# Patient Record
Sex: Female | Born: 1990 | Race: Black or African American | Hispanic: No | Marital: Single | Smoking: Former smoker
Health system: Southern US, Community
[De-identification: ages and names within clinical notes are randomized; demographics above are authoritative.]

## PROBLEM LIST (undated history)

## (undated) DIAGNOSIS — F1010 Alcohol abuse, uncomplicated: Secondary | ICD-10-CM

## (undated) DIAGNOSIS — Z9151 Personal history of suicidal behavior: Secondary | ICD-10-CM

## (undated) DIAGNOSIS — IMO0002 Reserved for concepts with insufficient information to code with codable children: Secondary | ICD-10-CM

## (undated) DIAGNOSIS — E119 Type 2 diabetes mellitus without complications: Secondary | ICD-10-CM

## (undated) DIAGNOSIS — Z915 Personal history of self-harm: Secondary | ICD-10-CM

## (undated) DIAGNOSIS — D649 Anemia, unspecified: Secondary | ICD-10-CM

## (undated) DIAGNOSIS — C801 Malignant (primary) neoplasm, unspecified: Secondary | ICD-10-CM

## (undated) DIAGNOSIS — F319 Bipolar disorder, unspecified: Secondary | ICD-10-CM

## (undated) DIAGNOSIS — M1990 Unspecified osteoarthritis, unspecified site: Secondary | ICD-10-CM

## (undated) DIAGNOSIS — M069 Rheumatoid arthritis, unspecified: Secondary | ICD-10-CM

## (undated) DIAGNOSIS — F1210 Cannabis abuse, uncomplicated: Secondary | ICD-10-CM

## (undated) HISTORY — DX: Type 2 diabetes mellitus without complications: E11.9

---

## 2012-11-16 ENCOUNTER — Emergency Department (HOSPITAL_COMMUNITY)
Admission: EM | Admit: 2012-11-16 | Discharge: 2012-11-17 | Disposition: A | Discharge status: Home / Self Care | Payer: Self-pay | Attending: Emergency Medicine | Admitting: Emergency Medicine

## 2012-11-16 ENCOUNTER — Encounter (HOSPITAL_COMMUNITY): Payer: Self-pay | Admitting: *Deleted

## 2012-11-16 DIAGNOSIS — Y9389 Activity, other specified: Secondary | ICD-10-CM | POA: Insufficient documentation

## 2012-11-16 DIAGNOSIS — IMO0002 Reserved for concepts with insufficient information to code with codable children: Secondary | ICD-10-CM | POA: Insufficient documentation

## 2012-11-16 DIAGNOSIS — Y92410 Unspecified street and highway as the place of occurrence of the external cause: Secondary | ICD-10-CM | POA: Insufficient documentation

## 2012-11-16 DIAGNOSIS — M549 Dorsalgia, unspecified: Secondary | ICD-10-CM

## 2012-11-16 MED ORDER — OXYCODONE-ACETAMINOPHEN 5-325 MG PO TABS
1.0000 | ORAL_TABLET | Freq: Once | ORAL | Status: AC
  Administered 2012-11-16: 1 via ORAL
  Filled 2012-11-16: qty 1
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
## 2012-11-17 MED ORDER — DIAZEPAM 5 MG PO TABS
5.0000 mg | ORAL_TABLET | Freq: Once | ORAL | Status: AC
  Administered 2012-11-17: 5 mg via ORAL
  Filled 2012-11-17: qty 1

## 2012-11-17 MED ORDER — PERCOCET 5-325 MG PO TABS: 1.0000 | ORAL_TABLET | Freq: Four times a day (QID) | ORAL | Status: DC | PRN
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
## 2012-11-17 NOTE — ED Provider Notes (Signed)
Medical screening examination/treatment/procedure(s) were performed by non-physician practitioner and as supervising physician I was immediately available for consultation/collaboration.  Olivia Mackie, MD 11/17/12 [phone]

## 2013-08-31 ENCOUNTER — Encounter (HOSPITAL_COMMUNITY): Payer: Self-pay | Admitting: Emergency Medicine

## 2013-08-31 ENCOUNTER — Emergency Department (HOSPITAL_COMMUNITY)
Admission: EM | Admit: 2013-08-31 | Discharge: 2013-09-01 | Disposition: A | Discharge status: Home / Self Care | Payer: Self-pay | Attending: Emergency Medicine | Admitting: Emergency Medicine

## 2013-08-31 ENCOUNTER — Emergency Department (HOSPITAL_COMMUNITY): Payer: Self-pay

## 2013-08-31 DIAGNOSIS — N949 Unspecified condition associated with female genital organs and menstrual cycle: Secondary | ICD-10-CM | POA: Insufficient documentation

## 2013-08-31 DIAGNOSIS — N9489 Other specified conditions associated with female genital organs and menstrual cycle: Secondary | ICD-10-CM

## 2013-08-31 DIAGNOSIS — F17200 Nicotine dependence, unspecified, uncomplicated: Secondary | ICD-10-CM | POA: Insufficient documentation

## 2013-08-31 DIAGNOSIS — Z3202 Encounter for pregnancy test, result negative: Secondary | ICD-10-CM | POA: Insufficient documentation

## 2013-08-31 DIAGNOSIS — M549 Dorsalgia, unspecified: Secondary | ICD-10-CM | POA: Insufficient documentation

## 2013-08-31 LAB — BASIC METABOLIC PANEL
BUN: 11 mg/dL (ref 6–23)
CALCIUM: 9.3 mg/dL (ref 8.4–10.5)
CO2: 26 mEq/L (ref 19–32)
CREATININE: 0.73 mg/dL (ref 0.50–1.10)
Chloride: 100 mEq/L (ref 96–112)
Glucose, Bld: 84 mg/dL (ref 70–99)
Potassium: 3.8 mEq/L (ref 3.7–5.3)
Sodium: 138 mEq/L (ref 137–147)

## 2013-08-31 LAB — CBC WITH DIFFERENTIAL/PLATELET
BASOS ABS: 0 10*3/uL (ref 0.0–0.1)
BASOS PCT: 0 % (ref 0–1)
EOS ABS: 0.1 10*3/uL (ref 0.0–0.7)
EOS PCT: 1 % (ref 0–5)
HEMATOCRIT: 38.6 % (ref 36.0–46.0)
Hemoglobin: 12.9 g/dL (ref 12.0–15.0)
Lymphocytes Relative: 37 % (ref 12–46)
Lymphs Abs: 3.6 10*3/uL (ref 0.7–4.0)
MCH: 30.9 pg (ref 26.0–34.0)
MCHC: 33.4 g/dL (ref 30.0–36.0)
MCV: 92.6 fL (ref 78.0–100.0)
MONO ABS: 0.9 10*3/uL (ref 0.1–1.0)
Monocytes Relative: 9 % (ref 3–12)
NEUTROS ABS: 5.1 10*3/uL (ref 1.7–7.7)
Neutrophils Relative %: 52 % (ref 43–77)
Platelets: 312 10*3/uL (ref 150–400)
RBC: 4.17 MIL/uL (ref 3.87–5.11)
RDW: 12.9 % (ref 11.5–15.5)
WBC: 9.8 10*3/uL (ref 4.0–10.5)

## 2013-08-31 LAB — URINALYSIS, ROUTINE W REFLEX MICROSCOPIC
Bilirubin Urine: NEGATIVE
GLUCOSE, UA: NEGATIVE mg/dL
Hgb urine dipstick: NEGATIVE
KETONES UR: NEGATIVE mg/dL
LEUKOCYTES UA: NEGATIVE
Nitrite: NEGATIVE
PH: 6 (ref 5.0–8.0)
Protein, ur: NEGATIVE mg/dL
Specific Gravity, Urine: 1.024 (ref 1.005–1.030)
Urobilinogen, UA: 0.2 mg/dL (ref 0.0–1.0)

## 2013-08-31 LAB — WET PREP, GENITAL
Trich, Wet Prep: NONE SEEN
YEAST WET PREP: NONE SEEN

## 2013-08-31 LAB — PREGNANCY, URINE: Preg Test, Ur: NEGATIVE

## 2013-08-31 MED ORDER — TRAMADOL HCL 50 MG PO TABS: 50.0000 mg | ORAL_TABLET | Freq: Four times a day (QID) | ORAL | Status: DC | PRN

## 2013-08-31 MED ORDER — LORAZEPAM 2 MG/ML IJ SOLN
0.5000 mg | Freq: Once | INTRAMUSCULAR | Status: AC
  Administered 2013-08-31: 0.5 mg via INTRAVENOUS
  Filled 2013-08-31: qty 1

## 2013-08-31 MED ORDER — KETOROLAC TROMETHAMINE 30 MG/ML IJ SOLN
30.0000 mg | Freq: Once | INTRAMUSCULAR | Status: AC
  Administered 2013-08-31: 30 mg via INTRAVENOUS
  Filled 2013-08-31: qty 1

## 2013-08-31 MED ORDER — SODIUM CHLORIDE 0.9 % IV SOLN
INTRAVENOUS | Status: DC
  Administered 2013-08-31: 22:00:00 via INTRAVENOUS

## 2013-08-31 MED ORDER — ONDANSETRON HCL 4 MG/2ML IJ SOLN
4.0000 mg | Freq: Once | INTRAMUSCULAR | Status: AC
  Administered 2013-08-31: 4 mg via INTRAVENOUS
  Filled 2013-08-31: qty 2

## 2013-08-31 MED ORDER — MORPHINE SULFATE 4 MG/ML IJ SOLN
4.0000 mg | Freq: Once | INTRAMUSCULAR | Status: AC
  Administered 2013-08-31: 4 mg via INTRAVENOUS
  Filled 2013-08-31: qty 1

## 2013-08-31 MED ORDER — HYDROMORPHONE HCL PF 1 MG/ML IJ SOLN
1.0000 mg | Freq: Once | INTRAMUSCULAR | Status: AC
  Administered 2013-08-31: 1 mg via INTRAVENOUS
  Filled 2013-08-31: qty 1

## 2013-08-31 MED ORDER — DIAZEPAM 5 MG PO TABS: 5.0000 mg | ORAL_TABLET | Freq: Two times a day (BID) | ORAL | Status: DC

## 2013-08-31 NOTE — Discharge Instructions (Signed)
Pelvic Pain, Female °Female pelvic pain can be caused by many different things and start from a variety of places. Pelvic pain refers to pain that is located in the lower half of the abdomen and between your hips. The pain may occur over a short period of time (acute) or may be reoccurring (chronic). The cause of pelvic pain may be related to disorders affecting the female reproductive organs (gynecologic), but it may also be related to the bladder, kidney stones, an intestinal complication, or muscle or skeletal problems. Getting help right away for pelvic pain is important, especially if there has been severe, sharp, or a sudden onset of unusual pain. It is also important to get help right away because some types of pelvic pain can be life threatening.  °CAUSES  °Below are only some of the causes of pelvic pain. The causes of pelvic pain can be in one of several categories.  °· Gynecologic. °· Pelvic inflammatory disease. °· Sexually transmitted infection. °· Ovarian cyst or a twisted ovarian ligament (ovarian torsion). °· Uterine lining that grows outside the uterus (endometriosis). °· Fibroids, cysts, or tumors. °· Ovulation. °· Pregnancy. °· Pregnancy that occurs outside the uterus (ectopic pregnancy). °· Miscarriage. °· Labor. °· Abruption of the placenta or ruptured uterus. °· Infection. °· Uterine infection (endometritis). °· Bladder infection. °· Diverticulitis. °· Miscarriage related to a uterine infection (septic abortion). °· Bladder. °· Inflammation of the bladder (cystitis). °· Kidney stone(s). °· Gastrointenstinal. °· Constipation. °· Diverticulitis. °· Neurologic. °· Trauma. °· Feeling pelvic pain because of mental or emotional causes (psychosomatic). °· Cancers of the bowel or pelvis. °EVALUATION  °Your caregiver will want to take a careful history of your concerns. This includes recent changes in your health, a careful gynecologic history of your periods (menses), and a sexual history. Obtaining  your family history and medical history is also important. Your caregiver may suggest a pelvic exam. A pelvic exam will help identify the location and severity of the pain. It also helps in the evaluation of which organ system may be involved. In order to identify the cause of the pelvic pain and be properly treated, your caregiver may order tests. These tests may include:  °· A pregnancy test. °· Pelvic ultrasonography. °· An X-ray exam of the abdomen. °· A urinalysis or evaluation of vaginal discharge. °· Blood tests. °HOME CARE INSTRUCTIONS  °· Only take over-the-counter or prescription medicines for pain, discomfort, or fever as directed by your caregiver.   °· Rest as directed by your caregiver.   °· Eat a balanced diet.   °· Drink enough fluids to make your urine clear or pale yellow, or as directed.   °· Avoid sexual intercourse if it causes pain.   °· Apply warm or cold compresses to the lower abdomen depending on which one helps the pain.   °· Avoid stressful situations.   °· Keep a journal of your pelvic pain. Write down when it started, where the pain is located, and if there are things that seem to be associated with the pain, such as food or your menstrual cycle. °· Follow up with your caregiver as directed.   °SEEK MEDICAL CARE IF: °· Your medicine does not help your pain. °· You have abnormal vaginal discharge. °SEEK IMMEDIATE MEDICAL CARE IF:  °· You have heavy bleeding from the vagina.   °· Your pelvic pain increases.   °· You feel lightheaded or faint.   °· You have chills.   °· You have pain with urination or blood in your urine.   °· You have uncontrolled   diarrhea or vomiting.   °· You have a fever or persistent symptoms for more than 3 days. °· You have a fever and your symptoms suddenly get worse.   °· You are being physically or sexually abused.   °MAKE SURE YOU: °· Understand these instructions. °· Will watch your condition. °· Will get help if you are not doing well or get worse. °Document  Released: 06/16/2004 Document Revised: 01/19/2012 Document Reviewed: 11/09/2011 °[employer]® Patient Information ©2014 [employer]. ° °

## 2013-08-31 NOTE — ED Notes (Signed)
Pt states she is having pelvic pain  Pt states it is like cramping  Pt states the cramping is intermittent and is regular like every 7 minutes apart  Pt states she is not pregnant   Pt states her last period lasted 3 days  Started on the Saturday the 24th

## 2013-08-31 NOTE — ED Provider Notes (Signed)
CSN: [id number]     Arrival date & time 08/31/13  2023 History   First MD Initiated Contact with Patient 08/31/13 2029     Chief Complaint  Patient presents with  . Pelvic Pain   (Consider location/radiation/quality/duration/timing/severity/associated sxs/prior Treatment) HPI  Patient presents to the ER with complaints of pelvic pain. She started her period 4 days ago and it ended yesterday. Her period was a lighter flow than normal but the color was regular. She developed pelvic pain today that is severe and intermittent. The pain brings her to tears. She describes suprapubic cramping that lasts a few minutes and stops, as if they were labor pains. She admits to being sexually active, she does not take birth control or use protection. She is only with one sexual partner. She denies taking any hormone therapy. Denies ever having pains like this in the past. No fevers, nausea, vomiting, diarrhea, weakness, confusion, irregular vaginal discharge. + back pain  History reviewed. No pertinent past medical history. History reviewed. No pertinent past surgical history. Family History  Problem Relation Age of Onset  . Hypertension Other    History  Substance Use Topics  . Smoking status: Current Every Day Smoker    Types: Cigarettes  . Smokeless tobacco: Not on file  . Alcohol Use: Yes     Comment: occ   OB History   Grav Para Term Preterm Abortions TAB SAB Ect Mult Living                 Review of Systems  All other systems reviewed and are negative.     Allergies  Review of patient's allergies indicates no known allergies.  Home Medications   Current Outpatient Rx  Name  Route  Sig  Dispense  Refill  . diazepam (VALIUM) 5 MG tablet   Oral   Take 1 tablet (5 mg total) by mouth 2 (two) times daily.   10 tablet   0   . traMADol (ULTRAM) 50 MG tablet   Oral   Take 1 tablet (50 mg total) by mouth every 6 (six) hours as needed.   15 tablet   0    BP 117/74  Pulse 87   Temp(Src) 99 F (37.2 C) (Oral)  Resp 16  Ht 6' (1.829 m)  Wt 250 lb (113.399 kg)  BMI 33.90 kg/m2  SpO2 100%  LMP 08/26/2013 Physical Exam  Nursing note and vitals reviewed. Constitutional: She appears well-developed and well-nourished. No distress.  HENT:  Head: Normocephalic and atraumatic.  Eyes: Pupils are equal, round, and reactive to light.  Neck: Normal range of motion. Neck supple.  Cardiovascular: Normal rate and regular rhythm.   Pulmonary/Chest: Effort normal.  Abdominal: Soft. There is tenderness in the suprapubic area. There is guarding. There is no rigidity, no rebound, no CVA tenderness and negative Murphy's sign.  Neurological: She is alert.  Skin: Skin is warm and dry.    ED Course  Procedures (including critical care time) Labs Review Labs Reviewed  WET PREP, GENITAL - Abnormal; Notable for the following:    Clue Cells Wet Prep HPF POC FEW (*)    WBC, Wet Prep HPF POC FEW (*)    All other components within normal limits  URINALYSIS, ROUTINE W REFLEX MICROSCOPIC - Abnormal; Notable for the following:    APPearance CLOUDY (*)    All other components within normal limits  GC/CHLAMYDIA PROBE AMP  PREGNANCY, URINE  CBC WITH DIFFERENTIAL  BASIC METABOLIC PANEL   Imaging  Review US Transvaginal Non-ob  08/31/2013   CLINICAL DATA:  Pelvic pain  EXAM: TRANSABDOMINAL AND TRANSVAGINAL ULTRASOUND OF PELVIS  DOPPLER ULTRASOUND OF OVARIES  TECHNIQUE: Both transabdominal and transvaginal ultrasound examinations of the pelvis were performed. Transabdominal technique was performed for global imaging of the pelvis including uterus, ovaries, adnexal regions, and pelvic cul-de-sac.  It was necessary to proceed with endovaginal exam following the transabdominal exam to visualize the ovaries and better detail. Color and duplex Doppler ultrasound was utilized to evaluate blood flow to the ovaries.  COMPARISON:  None.  FINDINGS: Uterus  Measurements: 8.2 x 3.9 x 4.6 cm. Small  scattered uterine fibroids measuring 1.5 cm or less in size.  Endometrium  Thickness: 7 mm.  No focal abnormality visualized.  Right ovary  Measurements: 3.1 x 1.7 x 1.9 cm. Normal appearance/no adnexal mass.  Left ovary  Measurements: 4.1 x 2.6 x 3.0 cm. Normal appearance/no adnexal mass.  Pulsed Doppler evaluation of both ovaries demonstrates normal low-resistance arterial and venous waveforms.  Other findings  No free fluid.  IMPRESSION: No acute finding by pelvic ultrasound. Incidental small uterine fibroids.   Electronically Signed   By: Daryll Brod M.D.   On: 08/31/2013 22:54   US Pelvis Complete  08/31/2013   CLINICAL DATA:  Pelvic pain  EXAM: TRANSABDOMINAL AND TRANSVAGINAL ULTRASOUND OF PELVIS  DOPPLER ULTRASOUND OF OVARIES  TECHNIQUE: Both transabdominal and transvaginal ultrasound examinations of the pelvis were performed. Transabdominal technique was performed for global imaging of the pelvis including uterus, ovaries, adnexal regions, and pelvic cul-de-sac.  It was necessary to proceed with endovaginal exam following the transabdominal exam to visualize the ovaries and better detail. Color and duplex Doppler ultrasound was utilized to evaluate blood flow to the ovaries.  COMPARISON:  None.  FINDINGS: Uterus  Measurements: 8.2 x 3.9 x 4.6 cm. Small scattered uterine fibroids measuring 1.5 cm or less in size.  Endometrium  Thickness: 7 mm.  No focal abnormality visualized.  Right ovary  Measurements: 3.1 x 1.7 x 1.9 cm. Normal appearance/no adnexal mass.  Left ovary  Measurements: 4.1 x 2.6 x 3.0 cm. Normal appearance/no adnexal mass.  Pulsed Doppler evaluation of both ovaries demonstrates normal low-resistance arterial and venous waveforms.  Other findings  No free fluid.  IMPRESSION: No acute finding by pelvic ultrasound. Incidental small uterine fibroids.   Electronically Signed   By: Daryll Brod M.D.   On: 08/31/2013 22:54   [place] Art/ven Flow Abd Pelv Doppler  08/31/2013   CLINICAL DATA:   Pelvic pain  EXAM: TRANSABDOMINAL AND TRANSVAGINAL ULTRASOUND OF PELVIS  DOPPLER ULTRASOUND OF OVARIES  TECHNIQUE: Both transabdominal and transvaginal ultrasound examinations of the pelvis were performed. Transabdominal technique was performed for global imaging of the pelvis including uterus, ovaries, adnexal regions, and pelvic cul-de-sac.  It was necessary to proceed with endovaginal exam following the transabdominal exam to visualize the ovaries and better detail. Color and duplex Doppler ultrasound was utilized to evaluate blood flow to the ovaries.  COMPARISON:  None.  FINDINGS: Uterus  Measurements: 8.2 x 3.9 x 4.6 cm. Small scattered uterine fibroids measuring 1.5 cm or less in size.  Endometrium  Thickness: 7 mm.  No focal abnormality visualized.  Right ovary  Measurements: 3.1 x 1.7 x 1.9 cm. Normal appearance/no adnexal mass.  Left ovary  Measurements: 4.1 x 2.6 x 3.0 cm. Normal appearance/no adnexal mass.  Pulsed Doppler evaluation of both ovaries demonstrates normal low-resistance arterial and venous waveforms.  Other findings  No free fluid.  IMPRESSION: No acute finding by pelvic ultrasound. Incidental small uterine fibroids.   Electronically Signed   By: Daryll Brod M.D.   On: 08/31/2013 22:54    EKG Interpretation   None       MDM   1. Uterine cramping    Patients work-up has returned negative. Unsure of cause of her uterine cramping. However, she is not pregnant, does not have infection, no ovarian torsion, no abnormal lab work.  Her pain and spasms have been treated in the ED and have largely resolved. She has been given a work note, pain medication and muscle relaxers for home.  22 y.o.Tammy Garrison's evaluation in the Emergency Department is complete. It has been determined that no acute conditions requiring further emergency intervention are present at this time. The patient/guardian have been advised of the diagnosis and plan. We have discussed signs and symptoms  that warrant return to the ED, such as changes or worsening in symptoms.  Vital signs are stable at discharge. Filed Vitals:   08/31/13 2242  BP: 117/74  Pulse: 87  Temp: 99 F (37.2 C)  Resp: 16    Patient/guardian has voiced understanding and agreed to follow-up with the PCP or specialist.     Linus Mako, PA-C 08/31/13 2357

## 2013-09-01 LAB — GC/CHLAMYDIA PROBE AMP
CT Probe RNA: NEGATIVE
GC PROBE AMP APTIMA: NEGATIVE

## 2013-09-03 NOTE — ED Provider Notes (Signed)
Medical screening examination/treatment/procedure(s) were performed by non-physician practitioner and as supervising physician I was immediately available for consultation/collaboration.  EKG Interpretation   None      '  Onofre Gains E Ariday Brinker, MD 09/03/13 [id number]

## 2013-09-12 ENCOUNTER — Emergency Department (HOSPITAL_COMMUNITY): Payer: Self-pay

## 2013-09-12 ENCOUNTER — Emergency Department (HOSPITAL_COMMUNITY)
Admission: EM | Admit: 2013-09-12 | Discharge: 2013-09-13 | Disposition: A | Discharge status: Home / Self Care | Payer: Self-pay | Attending: Emergency Medicine | Admitting: Emergency Medicine

## 2013-09-12 ENCOUNTER — Encounter (HOSPITAL_COMMUNITY): Payer: Self-pay | Admitting: Emergency Medicine

## 2013-09-12 DIAGNOSIS — Z79899 Other long term (current) drug therapy: Secondary | ICD-10-CM | POA: Insufficient documentation

## 2013-09-12 DIAGNOSIS — F17200 Nicotine dependence, unspecified, uncomplicated: Secondary | ICD-10-CM | POA: Insufficient documentation

## 2013-09-12 DIAGNOSIS — Z3202 Encounter for pregnancy test, result negative: Secondary | ICD-10-CM | POA: Insufficient documentation

## 2013-09-12 DIAGNOSIS — N390 Urinary tract infection, site not specified: Secondary | ICD-10-CM | POA: Insufficient documentation

## 2013-09-12 DIAGNOSIS — R102 Pelvic and perineal pain: Secondary | ICD-10-CM

## 2013-09-12 DIAGNOSIS — Z792 Long term (current) use of antibiotics: Secondary | ICD-10-CM | POA: Insufficient documentation

## 2013-09-12 DIAGNOSIS — R42 Dizziness and giddiness: Secondary | ICD-10-CM | POA: Insufficient documentation

## 2013-09-12 LAB — CBC WITH DIFFERENTIAL/PLATELET
Basophils Absolute: 0 10*3/uL (ref 0.0–0.1)
Basophils Relative: 0 % (ref 0–1)
EOS ABS: 0.1 10*3/uL (ref 0.0–0.7)
EOS PCT: 1 % (ref 0–5)
HCT: 38.6 % (ref 36.0–46.0)
HEMOGLOBIN: 13 g/dL (ref 12.0–15.0)
LYMPHS ABS: 2.9 10*3/uL (ref 0.7–4.0)
LYMPHS PCT: 24 % (ref 12–46)
MCH: 31.6 pg (ref 26.0–34.0)
MCHC: 33.7 g/dL (ref 30.0–36.0)
MCV: 93.7 fL (ref 78.0–100.0)
MONOS PCT: 8 % (ref 3–12)
Monocytes Absolute: 1 10*3/uL (ref 0.1–1.0)
NEUTROS PCT: 67 % (ref 43–77)
Neutro Abs: 8.1 10*3/uL — ABNORMAL HIGH (ref 1.7–7.7)
Platelets: 257 10*3/uL (ref 150–400)
RBC: 4.12 MIL/uL (ref 3.87–5.11)
RDW: 13 % (ref 11.5–15.5)
WBC: 12.2 10*3/uL — AB (ref 4.0–10.5)

## 2013-09-12 LAB — WET PREP, GENITAL
Clue Cells Wet Prep HPF POC: NONE SEEN
TRICH WET PREP: NONE SEEN
YEAST WET PREP: NONE SEEN

## 2013-09-12 LAB — COMPREHENSIVE METABOLIC PANEL
ALK PHOS: 66 U/L (ref 39–117)
ALT: 25 U/L (ref 0–35)
AST: 16 U/L (ref 0–37)
Albumin: 3.9 g/dL (ref 3.5–5.2)
BILIRUBIN TOTAL: 0.2 mg/dL — AB (ref 0.3–1.2)
BUN: 12 mg/dL (ref 6–23)
CO2: 26 meq/L (ref 19–32)
Calcium: 9.5 mg/dL (ref 8.4–10.5)
Chloride: 101 mEq/L (ref 96–112)
Creatinine, Ser: 0.74 mg/dL (ref 0.50–1.10)
GLUCOSE: 97 mg/dL (ref 70–99)
POTASSIUM: 4.2 meq/L (ref 3.7–5.3)
Sodium: 138 mEq/L (ref 137–147)
TOTAL PROTEIN: 7.7 g/dL (ref 6.0–8.3)

## 2013-09-12 LAB — URINALYSIS, ROUTINE W REFLEX MICROSCOPIC
Bilirubin Urine: NEGATIVE
Glucose, UA: NEGATIVE mg/dL
KETONES UR: NEGATIVE mg/dL
NITRITE: POSITIVE — AB
PROTEIN: NEGATIVE mg/dL
Specific Gravity, Urine: 1.023 (ref 1.005–1.030)
Urobilinogen, UA: 1 mg/dL (ref 0.0–1.0)
pH: 6 (ref 5.0–8.0)

## 2013-09-12 LAB — URINE MICROSCOPIC-ADD ON

## 2013-09-12 LAB — LIPASE, BLOOD: LIPASE: 31 U/L (ref 11–59)

## 2013-09-12 LAB — POCT PREGNANCY, URINE: Preg Test, Ur: NEGATIVE

## 2013-09-12 MED ORDER — SODIUM CHLORIDE 0.9 % IV BOLUS (SEPSIS)
1000.0000 mL | Freq: Once | INTRAVENOUS | Status: AC
Start: 2013-09-12 — End: 2013-09-12
  Administered 2013-09-12: 1000 mL via INTRAVENOUS

## 2013-09-12 MED ORDER — NAPROXEN 375 MG PO TABS
375.0000 mg | ORAL_TABLET | Freq: Once | ORAL | Status: AC
  Administered 2013-09-12: 375 mg via ORAL
  Filled 2013-09-12: qty 1

## 2013-09-12 MED ORDER — CIPROFLOXACIN HCL 500 MG PO TABS: 500.0000 mg | ORAL_TABLET | Freq: Two times a day (BID) | ORAL | Status: DC

## 2013-09-12 MED ORDER — CEFTRIAXONE SODIUM 1 G IJ SOLR
1.0000 g | Freq: Once | INTRAMUSCULAR | Status: AC
  Administered 2013-09-12: 1 g via INTRAVENOUS
  Filled 2013-09-12: qty 10

## 2013-09-12 MED ORDER — KETOROLAC TROMETHAMINE 30 MG/ML IJ SOLN
30.0000 mg | Freq: Once | INTRAMUSCULAR | Status: AC
  Administered 2013-09-12: 30 mg via INTRAVENOUS
  Filled 2013-09-12: qty 1

## 2013-09-12 NOTE — ED Provider Notes (Signed)
CSN: [id number]     Arrival date & time 09/12/13  1745 History   First MD Initiated Contact with Patient 09/12/13 1926     Chief Complaint  Patient presents with  . Hematuria     (Consider location/radiation/quality/duration/timing/severity/associated sxs/prior Treatment) The history is provided by the patient. No language interpreter was used.  Tammy Garrison is a 23 y/o F with no significant PMHx presenting to the ED with right sided abdominal pain, left sided flank pain, hematuria, and dysuria that has started Friday - decreased yesterday - but has not increased today. Patient reported that she has been experiencing left flank pain described as a "pushing" sensation that worsens with urination. Stated that she's been experiencing right-sided abdominal pain described as a sharp pain that is constant but worse with urination. Stated that she's been having mild dysuria, tingling sensations with urination. Noted that with each urination there is hematuria. Stated that when she wiped she notices a dark red discharge on the toilet paper. Stated that this occurs intermittently with each urination. Reported that she's taking nothing for the pain. Stated that she started to feel mildly dizzy this afternoon. Reported that she is sexually active, does not use protection her birth control. Denied fever, chills, chest pain, shortness of breath, difficulty breathing, nausea, vomiting, diarrhea, hematochezia, melena, changes to appetite. PCP none  History reviewed. No pertinent past medical history. History reviewed. No pertinent past surgical history. Family History  Problem Relation Age of Onset  . Hypertension Other    History  Substance Use Topics  . Smoking status: Current Every Day Smoker    Types: Cigarettes  . Smokeless tobacco: Not on file  . Alcohol Use: Yes     Comment: occ   OB History   Grav Para Term Preterm Abortions TAB SAB Ect Mult Living                 Review of  Systems  Constitutional: Negative for fever and chills.  Respiratory: Negative for chest tightness and shortness of breath.   Cardiovascular: Negative for chest pain.  Gastrointestinal: Positive for abdominal pain (right sided). Negative for nausea, vomiting, diarrhea, constipation, blood in stool and anal bleeding.  Genitourinary: Positive for dysuria, hematuria and flank pain (left). Negative for decreased urine volume, vaginal bleeding, vaginal discharge, vaginal pain and pelvic pain.  Musculoskeletal: Negative for neck pain.  Neurological: Negative for dizziness and weakness.  All other systems reviewed and are negative.      Allergies  Review of patient's allergies indicates no known allergies.  Home Medications   Current Outpatient Rx  Name  Route  Sig  Dispense  Refill  . ciprofloxacin (CIPRO) 500 MG tablet   Oral   Take 1 tablet (500 mg total) by mouth 2 (two) times daily.   14 tablet   0   . diazepam (VALIUM) 5 MG tablet   Oral   Take 1 tablet (5 mg total) by mouth 2 (two) times daily.   10 tablet   0   . traMADol (ULTRAM) 50 MG tablet   Oral   Take 1 tablet (50 mg total) by mouth every 6 (six) hours as needed.   15 tablet   0    BP 121/61  Pulse 95  Temp(Src) 98.6 F (37 C) (Oral)  Resp 18  SpO2 99%  LMP 08/26/2013 Physical Exam  Nursing note and vitals reviewed. Constitutional: She is oriented to person, place, and time. She appears well-developed and well-nourished. No distress.  HENT:  Head: Normocephalic and atraumatic.  Mouth/Throat: Oropharynx is clear and moist. No oropharyngeal exudate.  Eyes: Conjunctivae and EOM are normal. Pupils are equal, round, and reactive to light. Right eye exhibits no discharge. Left eye exhibits no discharge.  Neck: Normal range of motion. Neck supple. No tracheal deviation present.  Negative neck stiffness Negative nuchal rigidity Negative cervical lymphadenopathy  Cardiovascular: Normal rate, regular rhythm and  normal heart sounds.   Pulses:      Radial pulses are 2+ on the right side, and 2+ on the left side.  Pulmonary/Chest: Effort normal and breath sounds normal. No respiratory distress. She has no wheezes. She has no rales.  Abdominal: Soft. Normal appearance and bowel sounds are normal. There is tenderness in the right upper quadrant, epigastric area and suprapubic area. There is CVA tenderness (left-sided) and positive Murphy's sign. There is no guarding.    Tenderness upon palpation to the RUQ and epigastric region  Tenderness upon palpation to the suprapubic region  Obese Positive Murphy's sign  Genitourinary:  Pelvic Exam: Negative swelling, erythema, inflammation, lesions, sores noted to the external genitalia. Negative signs of erythema, lesions, sores, masses noted to the vaginal canal Thick white discharge noted to the vaginal region. Negative blood in vaginal. Cervical os identified-negative friability. Unremarkable cervix. Negative bilateral adnexal tenderness. Discomfort upon palpation to suprapubic region. Exam chaperoned with tech  Musculoskeletal: Normal range of motion.  Full ROM to upper and lower extremities without difficulty noted, negative ataxia noted.  Lymphadenopathy:    She has no cervical adenopathy.  Neurological: She is alert and oriented to person, place, and time. No cranial nerve deficit. She exhibits normal muscle tone. Coordination normal.  Cranial nerves III-XII grossly intact Strength 5+/5+ to upper and lower extremities bilaterally with resistance applied, equal distribution noted  Skin: Skin is warm and dry. No rash noted. She is not diaphoretic. No erythema.  Psychiatric: She has a normal mood and affect. Her behavior is normal. Thought content normal.    ED Course  Procedures (including critical care time)  This provider reviewed patient's chart. Patient was seen and assessed in the emergency department in 08/31/2013 regarding uterine cramping. Patient  was discharged with tramadol and Valium with OB/GYN followup.  Results for orders placed during the hospital encounter of 09/12/13  WET PREP, GENITAL      Result Value Ref Range   Yeast Wet Prep HPF POC NONE SEEN  NONE SEEN   Trich, Wet Prep NONE SEEN  NONE SEEN   Clue Cells Wet Prep HPF POC NONE SEEN  NONE SEEN   WBC, Wet Prep HPF POC FEW (*) NONE SEEN  GC/CHLAMYDIA PROBE AMP      Result Value Ref Range   CT Probe RNA NEGATIVE  NEGATIVE   GC Probe RNA NEGATIVE  NEGATIVE  URINALYSIS, ROUTINE W REFLEX MICROSCOPIC      Result Value Ref Range   Color, Urine ORANGE (*) YELLOW   APPearance TURBID (*) CLEAR   Specific Gravity, Urine 1.023  1.005 - 1.030   pH 6.0  5.0 - 8.0   Glucose, UA NEGATIVE  NEGATIVE mg/dL   Hgb urine dipstick LARGE (*) NEGATIVE   Bilirubin Urine NEGATIVE  NEGATIVE   Ketones, ur NEGATIVE  NEGATIVE mg/dL   Protein, ur NEGATIVE  NEGATIVE mg/dL   Urobilinogen, UA 1.0  0.0 - 1.0 mg/dL   Nitrite POSITIVE (*) NEGATIVE   Leukocytes, UA LARGE (*) NEGATIVE  URINE MICROSCOPIC-ADD ON      Result  Value Ref Range   Squamous Epithelial / LPF RARE  RARE   WBC, UA TOO NUMEROUS TO COUNT  <3 WBC/hpf   RBC / HPF 21-50  <3 RBC/hpf   Bacteria, UA FEW (*) RARE  CBC WITH DIFFERENTIAL      Result Value Ref Range   WBC 12.2 (*) 4.0 - 10.5 K/uL   RBC 4.12  3.87 - 5.11 MIL/uL   Hemoglobin 13.0  12.0 - 15.0 g/dL   HCT 38.6  36.0 - 46.0 %   MCV 93.7  78.0 - 100.0 fL   MCH 31.6  26.0 - 34.0 pg   MCHC 33.7  30.0 - 36.0 g/dL   RDW 13.0  11.5 - 15.5 %   Platelets 257  150 - 400 K/uL   Neutrophils Relative % 67  43 - 77 %   Neutro Abs 8.1 (*) 1.7 - 7.7 K/uL   Lymphocytes Relative 24  12 - 46 %   Lymphs Abs 2.9  0.7 - 4.0 K/uL   Monocytes Relative 8  3 - 12 %   Monocytes Absolute 1.0  0.1 - 1.0 K/uL   Eosinophils Relative 1  0 - 5 %   Eosinophils Absolute 0.1  0.0 - 0.7 K/uL   Basophils Relative 0  0 - 1 %   Basophils Absolute 0.0  0.0 - 0.1 K/uL  COMPREHENSIVE METABOLIC PANEL       Result Value Ref Range   Sodium 138  137 - 147 mEq/L   Potassium 4.2  3.7 - 5.3 mEq/L   Chloride 101  96 - 112 mEq/L   CO2 26  19 - 32 mEq/L   Glucose, Bld 97  70 - 99 mg/dL   BUN 12  6 - 23 mg/dL   Creatinine, Ser 0.74  0.50 - 1.10 mg/dL   Calcium 9.5  8.4 - 10.5 mg/dL   Total Protein 7.7  6.0 - 8.3 g/dL   Albumin 3.9  3.5 - 5.2 g/dL   AST 16  0 - 37 U/L   ALT 25  0 - 35 U/L   Alkaline Phosphatase 66  39 - 117 U/L   Total Bilirubin 0.2 (*) 0.3 - 1.2 mg/dL   GFR calc non Af Amer >90  >90 mL/min   GFR calc Af Amer >90  >90 mL/min  LIPASE, BLOOD      Result Value Ref Range   Lipase 31  11 - 59 U/L  POCT PREGNANCY, URINE      Result Value Ref Range   Preg Test, Ur NEGATIVE  NEGATIVE   Ct Abdomen Pelvis Wo Contrast  09/12/2013   CLINICAL DATA:  Hematuria  EXAM: CT ABDOMEN AND PELVIS WITHOUT CONTRAST  TECHNIQUE: Multidetector CT imaging of the abdomen and pelvis was performed following the standard protocol without intravenous contrast.  COMPARISON:  None.  FINDINGS: The liver, spleen, pancreas, gallbladder, adrenal glands and kidneys are normal. There is no nephrolithiasis or hydroureteronephrosis bilaterally. The aorta is normal. There is no abdominal lymphadenopathy. There is no small bowel obstruction or diverticulitis. The appendix is normal.  Fluid-filled bladder is normal. Pelvic phleboliths are identified. The uterus is normal. The ovaries are normal. There is a 2 mm calcified granuloma in the left lung base. There is no focal pneumonia or pleural effusion is visualized lung bases. No acute abnormalities identified within the visualized bones.  IMPRESSION: No acute abnormality in the abdomen and pelvis.   Electronically Signed   By: Mallie Darting.D.  On: 09/12/2013 22:46   US Transvaginal Non-ob  08/31/2013   CLINICAL DATA:  Pelvic pain  EXAM: TRANSABDOMINAL AND TRANSVAGINAL ULTRASOUND OF PELVIS  DOPPLER ULTRASOUND OF OVARIES  TECHNIQUE: Both transabdominal and transvaginal  ultrasound examinations of the pelvis were performed. Transabdominal technique was performed for global imaging of the pelvis including uterus, ovaries, adnexal regions, and pelvic cul-de-sac.  It was necessary to proceed with endovaginal exam following the transabdominal exam to visualize the ovaries and better detail. Color and duplex Doppler ultrasound was utilized to evaluate blood flow to the ovaries.  COMPARISON:  None.  FINDINGS: Uterus  Measurements: 8.2 x 3.9 x 4.6 cm. Small scattered uterine fibroids measuring 1.5 cm or less in size.  Endometrium  Thickness: 7 mm.  No focal abnormality visualized.  Right ovary  Measurements: 3.1 x 1.7 x 1.9 cm. Normal appearance/no adnexal mass.  Left ovary  Measurements: 4.1 x 2.6 x 3.0 cm. Normal appearance/no adnexal mass.  Pulsed Doppler evaluation of both ovaries demonstrates normal low-resistance arterial and venous waveforms.  Other findings  No free fluid.  IMPRESSION: No acute finding by pelvic ultrasound. Incidental small uterine fibroids.   Electronically Signed   By: Daryll Brod M.D.   On: 08/31/2013 22:54   US Pelvis Complete  08/31/2013   CLINICAL DATA:  Pelvic pain  EXAM: TRANSABDOMINAL AND TRANSVAGINAL ULTRASOUND OF PELVIS  DOPPLER ULTRASOUND OF OVARIES  TECHNIQUE: Both transabdominal and transvaginal ultrasound examinations of the pelvis were performed. Transabdominal technique was performed for global imaging of the pelvis including uterus, ovaries, adnexal regions, and pelvic cul-de-sac.  It was necessary to proceed with endovaginal exam following the transabdominal exam to visualize the ovaries and better detail. Color and duplex Doppler ultrasound was utilized to evaluate blood flow to the ovaries.  COMPARISON:  None.  FINDINGS: Uterus  Measurements: 8.2 x 3.9 x 4.6 cm. Small scattered uterine fibroids measuring 1.5 cm or less in size.  Endometrium  Thickness: 7 mm.  No focal abnormality visualized.  Right ovary  Measurements: 3.1 x 1.7 x 1.9 cm.  Normal appearance/no adnexal mass.  Left ovary  Measurements: 4.1 x 2.6 x 3.0 cm. Normal appearance/no adnexal mass.  Pulsed Doppler evaluation of both ovaries demonstrates normal low-resistance arterial and venous waveforms.  Other findings  No free fluid.  IMPRESSION: No acute finding by pelvic ultrasound. Incidental small uterine fibroids.   Electronically Signed   By: Daryll Brod M.D.   On: 08/31/2013 22:54   [place] Art/ven Flow Abd Pelv Doppler  08/31/2013   CLINICAL DATA:  Pelvic pain  EXAM: TRANSABDOMINAL AND TRANSVAGINAL ULTRASOUND OF PELVIS  DOPPLER ULTRASOUND OF OVARIES  TECHNIQUE: Both transabdominal and transvaginal ultrasound examinations of the pelvis were performed. Transabdominal technique was performed for global imaging of the pelvis including uterus, ovaries, adnexal regions, and pelvic cul-de-sac.  It was necessary to proceed with endovaginal exam following the transabdominal exam to visualize the ovaries and better detail. Color and duplex Doppler ultrasound was utilized to evaluate blood flow to the ovaries.  COMPARISON:  None.  FINDINGS: Uterus  Measurements: 8.2 x 3.9 x 4.6 cm. Small scattered uterine fibroids measuring 1.5 cm or less in size.  Endometrium  Thickness: 7 mm.  No focal abnormality visualized.  Right ovary  Measurements: 3.1 x 1.7 x 1.9 cm. Normal appearance/no adnexal mass.  Left ovary  Measurements: 4.1 x 2.6 x 3.0 cm. Normal appearance/no adnexal mass.  Pulsed Doppler evaluation of both ovaries demonstrates normal low-resistance arterial and venous waveforms.  Other findings  No free fluid.  IMPRESSION: No acute finding by pelvic ultrasound. Incidental small uterine fibroids.   Electronically Signed   By: Daryll Brod M.D.   On: 08/31/2013 22:54   Labs Review Labs Reviewed  WET PREP, GENITAL - Abnormal; Notable for the following:    WBC, Wet Prep HPF POC FEW (*)    All other components within normal limits  URINALYSIS, ROUTINE W REFLEX MICROSCOPIC - Abnormal; Notable  for the following:    Color, Urine ORANGE (*)    APPearance TURBID (*)    Hgb urine dipstick LARGE (*)    Nitrite POSITIVE (*)    Leukocytes, UA LARGE (*)    All other components within normal limits  URINE MICROSCOPIC-ADD ON - Abnormal; Notable for the following:    Bacteria, UA FEW (*)    All other components within normal limits  CBC WITH DIFFERENTIAL - Abnormal; Notable for the following:    WBC 12.2 (*)    Neutro Abs 8.1 (*)    All other components within normal limits  COMPREHENSIVE METABOLIC PANEL - Abnormal; Notable for the following:    Total Bilirubin 0.2 (*)    All other components within normal limits  GC/CHLAMYDIA PROBE AMP  LIPASE, BLOOD  POCT PREGNANCY, URINE   Imaging Review Ct Abdomen Pelvis Wo Contrast  09/12/2013   CLINICAL DATA:  Hematuria  EXAM: CT ABDOMEN AND PELVIS WITHOUT CONTRAST  TECHNIQUE: Multidetector CT imaging of the abdomen and pelvis was performed following the standard protocol without intravenous contrast.  COMPARISON:  None.  FINDINGS: The liver, spleen, pancreas, gallbladder, adrenal glands and kidneys are normal. There is no nephrolithiasis or hydroureteronephrosis bilaterally. The aorta is normal. There is no abdominal lymphadenopathy. There is no small bowel obstruction or diverticulitis. The appendix is normal.  Fluid-filled bladder is normal. Pelvic phleboliths are identified. The uterus is normal. The ovaries are normal. There is a 2 mm calcified granuloma in the left lung base. There is no focal pneumonia or pleural effusion is visualized lung bases. No acute abnormalities identified within the visualized bones.  IMPRESSION: No acute abnormality in the abdomen and pelvis.   Electronically Signed   By: Abelardo Diesel M.D.   On: 09/12/2013 22:46    EKG Interpretation   None       MDM   Final diagnoses:  Suprapubic pain  UTI (urinary tract infection)   Medications  sodium chloride 0.9 % bolus 1,000 mL (0 mLs Intravenous Stopped 09/12/13  2158)  ketorolac (TORADOL) 30 MG/ML injection 30 mg (30 mg Intravenous Given 09/12/13 2126)  cefTRIAXone (ROCEPHIN) 1 g in dextrose 5 % 50 mL IVPB (0 g Intravenous Stopped 09/12/13 2158)   Filed Vitals:   09/12/13 1834 09/12/13 2330  BP: 128/81 121/61  Pulse: 95   Temp: 98.6 F (37 C)   TempSrc: Oral   Resp:  18  SpO2: 99% 99%    Patient presenting to emergency department with left-sided flank pain, right-sided abdominal pain, dysuria, hematuria that has been ongoing since Friday. Reported that the left sided flank pain is described as a "pushing" sensation and the right side abdominal pain is described as a sharp pain-both worsen with urination. Reported that she has been noticing a dark red discharge on her toilet paper. Reported that she's used nothing for the discomfort. Patient seen and assessed in the emergency department on 09/01/2011 where she was diagnosed with uterine cramping-was discharged with Valium and tramadol. Negative findings for ovarian torsion. Alert and oriented. GCS 15.  Heart rate and rhythm normal. Lungs clear to auscultation. Radial pulses 2+ bilaterally. Positive CVA tenderness localized to the left side. Bowel sounds normoactive in all 4 quadrants-discomfort upon palpation to the right upper quadrant and right lower quadrant as well as suprapubic region upon palpation most discomfort with palpation to the suprapubic region. Nonsurgical abdomen, negative peritoneal signs or acute abdomen noted. Pelvic exam noted thick white discharge with negative order, negative blood in vaginal vault noted. Unremarkable cervix. Discomfort upon palpation to suprapubic region-negative adnexal tenderness bilaterally. CBC noted mild elevated white blood cell count of 12.2 with negative leukocytosis or left shift noted. CMP negative findings. Lipase negative elevation. Urine pregnancy negative. Urinalysis noted large hemoglobin with positive nitrites and leukocytes as well as too numerous to  count white blood cells-positive pyuria. Wet prep noted few white blood cells. GC/Chlamydia probe pending. Doubt appendicitis. Doubt pancreatitis. Doubt hydronephrosis. Doubt nephrolithiasis. Doubt acute abdominal processes. Doubt PID. Doubt ovarian torsion. Doubt ectopic pregnancy. Doubt TOA. Patient presenting to the ED with UTI/cystitis/pyelonephritis. Negative acute renal failure noted. Negative elevated WBC - negative leukocytosis noted. Patient does not appear septic. Patient stable, afebrile. Discharged patient. Discharged patient. Discharged patient with antibiotics. Discussed with patient to rest and stay hydrated. Discussed with patient to avoid any sexual activity. Referred to OBGYN and Urology. Discussed with patient to closely monitor symptoms and if symptoms are to worsen or change to report back to the ED - strict return instructions given.  Patient agreed to plan of care, understood, all questions answered.   Jamse Mead, PA-C 09/13/13 1324

## 2013-09-12 NOTE — Progress Notes (Signed)
   CARE MANAGEMENT ED NOTE 09/12/2013  Patient:  CARRINA, SCHOENBERGER   Account Number:  [id number]  Date Initiated:  09/12/2013  Documentation initiated by:  Livia Snellen  Subjective/Objective Assessment:   Patient presents to Ed with hematuria, and dysuria x 5 days     Subjective/Objective Assessment Detail:   Patient with pmhx of HTN.     Action/Plan:   Action/Plan Detail:   Anticipated DC Date:       Status Recommendation to Physician:   Result of Recommendation:    Other ED [place]  Other  PCP issues    Choice offered to / List presented to:            Status of service:  Completed, signed off  ED Comments:   ED Comments Detail:  Patient confirms she does not have a pcp or insurance. [facility] provided patient with list of pcps who accept self pay patients, list of discounted pharmacies and website needymeds.org for medication assistance, financial assistance in the community such as local churches and [employer], urban ministries, information regarding Affordable care act and Medicaid for insurance, dental assistance for uninsured patients, and phone number to call to inquire about the orange card.  Patient thankful for services.  No further EDCM needs at this time.

## 2013-09-12 NOTE — ED Notes (Signed)
Pt states she has had hematuria, and dysuria x 5 days. States it hurts to sit and she has had L flank pain. Denies vaginal issues.

## 2013-09-12 NOTE — Discharge Instructions (Signed)
Please call your doctor for a followup appointment within 24-48 hours. When you talk to your doctor please let them know that you were seen in the emergency department and have them acquire all of your records so that they can discuss the findings with you and formulate a treatment plan to fully care for your new and ongoing problems. Please call for an appointment with OB/GYN and neurology to be reassessed Please take antibiotics as prescribed-please take on a full stomach Will need to have urine rechecked within one week Please avoid any sexual activity Please rest and stay hydrated-please drink plenty fluids Please continue monitor symptoms closely if symptoms are to worsen or change (fever greater than 101, chills, chest pain, shortness of breath, difficulty breathing, nausea, vomiting, worsening symptoms, pain with urination, increased bladder, numbness, tingling, lower back pain) please report back to emergency department immediately  Urinary Tract Infection Urinary tract infections (UTIs) can develop anywhere along your urinary tract. Your urinary tract is your body's drainage system for removing wastes and extra water. Your urinary tract includes two kidneys, two ureters, a bladder, and a urethra. Your kidneys are a pair of bean-shaped organs. Each kidney is about the size of your fist. They are located below your ribs, one on each side of your spine. CAUSES Infections are caused by microbes, which are microscopic organisms, including fungi, viruses, and bacteria. These organisms are so small that they can only be seen through a microscope. Bacteria are the microbes that most commonly cause UTIs. SYMPTOMS  Symptoms of UTIs may vary by age and gender of the patient and by the location of the infection. Symptoms in young women typically include a frequent and intense urge to urinate and a painful, burning feeling in the bladder or urethra during urination. Older women and men are more likely to be  tired, shaky, and weak and have muscle aches and abdominal pain. A fever may mean the infection is in your kidneys. Other symptoms of a kidney infection include pain in your back or sides below the ribs, nausea, and vomiting. DIAGNOSIS To diagnose a UTI, your caregiver will ask you about your symptoms. Your caregiver also will ask to provide a urine sample. The urine sample will be tested for bacteria and white blood cells. White blood cells are made by your body to help fight infection. TREATMENT  Typically, UTIs can be treated with medication. Because most UTIs are caused by a bacterial infection, they usually can be treated with the use of antibiotics. The choice of antibiotic and length of treatment depend on your symptoms and the type of bacteria causing your infection. HOME CARE INSTRUCTIONS  If you were prescribed antibiotics, take them exactly as your caregiver instructs you. Finish the medication even if you feel better after you have only taken some of the medication.  Drink enough water and fluids to keep your urine clear or pale yellow.  Avoid caffeine, tea, and carbonated beverages. They tend to irritate your bladder.  Empty your bladder often. Avoid holding urine for long periods of time.  Empty your bladder before and after sexual intercourse.  After a bowel movement, women should cleanse from front to back. Use each tissue only once. SEEK MEDICAL CARE IF:   You have back pain.  You develop a fever.  Your symptoms do not begin to resolve within 3 days. SEEK IMMEDIATE MEDICAL CARE IF:   You have severe back pain or lower abdominal pain.  You develop chills.  You have nausea  or vomiting.  You have continued burning or discomfort with urination. MAKE SURE YOU:   Understand these instructions.  Will watch your condition.  Will get help right away if you are not doing well or get worse. Document Released: 04/29/2005 Document Revised: 01/19/2012 Document Reviewed:  08/28/2011 [facility] Patient Information 2014 [place].   Emergency Department Resource Guide 1) Find a Doctor and Pay Out of Pocket Although you won't have to find out who is covered by your insurance plan, it is a good idea to ask around and get recommendations. You will then need to call the office and see if the doctor you have chosen will accept you as a new patient and what types of options they offer for patients who are self-pay. Some doctors offer discounts or will set up payment plans for their patients who do not have insurance, but you will need to ask so you aren't surprised when you get to your appointment.  2) Contact Your Local Health Department Not all health departments have doctors that can see patients for sick visits, but many do, so it is worth a call to see if yours does. If you don't know where your local health department is, you can check in your phone book. The CDC also has a tool to help you locate your state's health department, and many state websites also have listings of all of their local health departments.  3) Find a [facility] If your illness is not likely to be very severe or complicated, you may want to try a walk in clinic. These are popping up all over the country in pharmacies, drugstores, and shopping centers. They're usually staffed by nurse practitioners or physician assistants that have been trained to treat common illnesses and complaints. They're usually fairly quick and inexpensive. However, if you have serious medical issues or chronic medical problems, these are probably not your best option.  No Primary Care Doctor: - Call Health Connect at  [phone] - they can help you locate a primary care doctor that  accepts your insurance, provides certain services, etc. - Physician Referral Service- [phone]  Chronic Pain Problems: Organization         Address  Phone   Notes  [facility]  [phone] Patients need to  be referred by their primary care doctor.   Medication Assistance: Organization         Address  Phone   Notes  [facility] Medication [facility] [address] [place], [place] [address] [phone] --Must be a resident of [facility] -- Must have NO insurance coverage whatsoever (no Medicaid/ Medicare, etc.) -- The pt. MUST have a primary care doctor that directs their care regularly and follows them in the community   MedAssist  [phone]   [employer]  4421186998    Agencies that provide inexpensive medical care: Organization         Address  Phone   Notes  [place]  [phone]   [facility]    [phone]   [facility] [place], [place] [address] [phone]   [place] [address] [place] [phone]   Planned Parenthood    [phone]   [facility]    [phone]   [place], [place] Phone:  [phone], Fax:  [phone]  Hours of Operation:  9 am - 6 pm, M-F.  Also accepts Medicaid/Medicare and self-pay.  [facility]  [address], [place] Phone: [phone], Fax: [phone]. Hours of Operation:  8:30 am - 5:30 pm, M-F.  Also accepts Medicaid and self-pay.  [facility] [place] [address] [place] Phone: [phone]   [facility] [address] Natasha Bence [place], [place] [phone], Ext. [phone] Mondays & Thursdays: 7-9 AM.  First 15 patients are seen on a first come, first serve basis.    Medicaid-accepting [facility] Providers:  Organization         Address  Phone   Notes  [facility] [address], [place] [phone] Also accepts self-pay patients.  [facility] [address] Laurell Josephs [place], [place]  [phone]   [facility] [address], [place] [phone]   [facility] [address] [place] [phone]   Renaye Rakers [address], [place]   [phone] Only accepts [place] patients after they have their name applied to their card.   Self-Pay (no insurance) in [facility]:  Organization         Address  Phone   Notes  Sickle Cell Patients, [facility] [address] [place], [place] [phone]   [facility] Urgent Care [address] [place], [place] [phone]   [facility] [place]  [address], [place] [phone]   [facility]/Dr. Osei-Bonsu  [address], [place] or [address], [place] [phone] Phone number for both [place] and [place] locations is the same.  [facility] Medical and [facility] [address] [place] [phone]   [facility] [address] [place] or [address] [phone]   [facility] [address] [place] [phone], phone; [phone], fax Sees patients 1st and 3rd Saturday of every month.  Must not qualify for public or private insurance (i.e. Medicaid, Medicare, [place] Health Choice, Veterans' Benefits)  Household income should be no more than 200% of the poverty level The clinic cannot treat you if you are pregnant or think you are pregnant  Sexually transmitted diseases are not treated at the clinic.    Dental Care: Organization         Address  Phone  Notes  [facility] Department of [facility] [address] [place], [place] [phone] Accepts children up to age 69 who are enrolled in [place] or [place] Health Choice; pregnant women with a Medicaid card; and children who have applied for Medicaid or [place] Health Choice, but were declined, whose parents can  pay a reduced fee at time of service.  [facility] Department of [facility]  [address], [place] [phone] Accepts children up to age 69 who are enrolled in [place] or [employer] Health Choice; pregnant women with a Medicaid card; and children who have applied for Medicaid or [place] Health Choice, but were declined, whose parents can pay a reduced fee at time of service.  [facility]  [address] [place], [place] [phone] Patients are seen by appointment only. Walk-ins are not accepted. [facility] will  see patients 33 years of age and older. Monday - Tuesday (8am-5pm) Most Wednesdays (8:30-5pm) $30 per visit, cash only  [facility] Adult [facility] Access PRO[facility]  [address], [facility] [phone] Patients are seen by appointment only. Walk-ins are not accepted. [facility] will see patients 63 years of age and older. One Wednesday Evening (Monthly: Volunteer Based).  $30 per visit, cash only  [employer]  [phone] for adults; Children under age 71, call Graduate Pediatric Dentistry at [phone]. Children aged 55-14, please call [phone] to request a pediatric application.  Dental services are provided in all areas of dental care including fillings, crowns and bridges, complete and partial dentures, implants, gum treatment, root canals, and extractions. Preventive care is also provided. Treatment is provided to both adults and children. Patients are selected via a lottery and there is often a waiting list.   [facility] [address], [place]  [phone] [email or web address]   [facility] [address] [place], [place] [phone], Ext. [phone] Second and Fourth Thursday of each month, opens at 6:30 AM; Clinic ends at 9 AM.  Patients are seen on a first-come first-served basis, and a limited number are seen during each clinic.   [facility]  [address] Ether Griffins [place], [place] [phone]   Eligibility Requirements You must have lived in [place], [place], or [place] counties for at least the last three months.   You cannot be eligible for state or federal sponsored [place], including [employer], [place], or [employer].   You generally cannot be eligible for healthcare insurance through your employer.    How to apply: Eligibility screenings are held every Tuesday and Wednesday afternoon from 1:00 pm until 4:00 pm. You do not need an appointment for the interview!  [facility] [address] [place], [place] [phone]   [facility]  [phone]   [facility]  [phone]   [facility]  [phone]    Behavioral Health Resources in the Community: Intensive Outpatient Programs Organization         Address  Phone  Notes  [facility] [address], [place], [place] [phone]   [facility] Outpatient [address] [place] [address] [place], [place] [phone]   [facility] [address] [place], [place]  [phone]   [facility] Mental Health [address],  [place], [place] [phone] or [phone]   Substance Abuse Resources Organization         Address  Phone  Notes  [facility] Drug [facility]  [phone]   [facility]  [phone]   [employer]  [phone]   Floydene Flock  [phone]   Residential & Outpatient Substance Abuse Program  [phone]   Psychological Services Organization         Address  Phone  Notes  [facility] Behavioral Health  [phone]   [facility] Services  [phone]   [facility] Mental Health [address], [place] [phone] or [phone]    Mobile Crisis Teams Organization         Address  Phone  Notes  [facility], Mobile Crisis Care Unit  [phone]    [facility]  [address] [place], [place] [phone]   [facility] [address] [place] [phone]    Self-Help/Support Groups Organization  Address  Phone             Notes  [place] [place] - variety of support groups  [place] Call for more information  Narcotics Anonymous (NA), [facility] [address], [employer] [place]  2 meetings at this location   Special educational needs teacher         Address  Phone  Notes  [facility] [place],    [place]  [phone]   [facility]  [address], [place] [phone], [place], [place]   [place], [place] [phone] Admissions: 8am-3pm M-F  Incentives Substance [place] [address]    [place], [place] [phone]   [facility] [address] [place], [place], [place]   [facility] [address]  [place], [place]   [facility] - Intensive Outpatient [address] Kristeen [address], [place], [place]   [facility] ([place].,  [place], [place] 1-6033563990 or [phone]   [facility] ([facility]) [address] [place], [place] Accepts Medicaid  [facility] [address],  [place] [phone] Substance Abuse/Addiction Treatment   [facility] Organization         Address  Phone  Notes  [facility]  [phone]   Domenic Schwab, PhD [address] Arlis Porta [place], [place]   [phone] or [phone]   [place] [facility] [place], [place] [phone]   [facility] [address] [place], [place] [phone] Insurance/Medicaid/sponsorship through [facility] and Families [address] [place]                                     [place], [place] [phone] [place] [address], [place] [phone]    Dr. Adele Schilder  [phone]   [facility] Dept. 1) [address] [place] 2) [place] 3)  [place] [address], [place] [phone]  [phone]   [place] [phone] or [phone] (After Hours)

## 2013-09-13 LAB — GC/CHLAMYDIA PROBE AMP
CT PROBE, AMP APTIMA: NEGATIVE
GC PROBE AMP APTIMA: NEGATIVE

## 2013-09-15 NOTE — ED Provider Notes (Signed)
Medical screening examination/treatment/procedure(s) were performed by non-physician practitioner and as supervising physician I was immediately available for consultation/collaboration.  EKG Interpretation   None         Delice Bison Matti Minney, DO 09/15/13 1458

## 2014-01-23 ENCOUNTER — Emergency Department (HOSPITAL_COMMUNITY)
Admission: EM | Admit: 2014-01-23 | Discharge: 2014-01-23 | Disposition: A | Discharge status: Home / Self Care | Payer: Self-pay | Attending: Emergency Medicine | Admitting: Emergency Medicine

## 2014-01-23 ENCOUNTER — Emergency Department (HOSPITAL_COMMUNITY): Payer: Self-pay

## 2014-01-23 ENCOUNTER — Encounter (HOSPITAL_COMMUNITY): Payer: Self-pay | Admitting: Emergency Medicine

## 2014-01-23 DIAGNOSIS — Z79899 Other long term (current) drug therapy: Secondary | ICD-10-CM | POA: Insufficient documentation

## 2014-01-23 DIAGNOSIS — F17200 Nicotine dependence, unspecified, uncomplicated: Secondary | ICD-10-CM | POA: Insufficient documentation

## 2014-01-23 DIAGNOSIS — Z792 Long term (current) use of antibiotics: Secondary | ICD-10-CM | POA: Insufficient documentation

## 2014-01-23 DIAGNOSIS — M545 Low back pain, unspecified: Secondary | ICD-10-CM | POA: Insufficient documentation

## 2014-01-23 DIAGNOSIS — Z3202 Encounter for pregnancy test, result negative: Secondary | ICD-10-CM | POA: Insufficient documentation

## 2014-01-23 LAB — URINALYSIS, ROUTINE W REFLEX MICROSCOPIC
BILIRUBIN URINE: NEGATIVE
Glucose, UA: NEGATIVE mg/dL
HGB URINE DIPSTICK: NEGATIVE
Ketones, ur: NEGATIVE mg/dL
Leukocytes, UA: NEGATIVE
NITRITE: NEGATIVE
PROTEIN: NEGATIVE mg/dL
Specific Gravity, Urine: 1.027 (ref 1.005–1.030)
UROBILINOGEN UA: 0.2 mg/dL (ref 0.0–1.0)
pH: 5.5 (ref 5.0–8.0)

## 2014-01-23 LAB — PREGNANCY, URINE: Preg Test, Ur: NEGATIVE

## 2014-01-23 MED ORDER — CYCLOBENZAPRINE HCL 10 MG PO TABS: 10.0000 mg | ORAL_TABLET | Freq: Two times a day (BID) | ORAL | Status: DC | PRN

## 2014-01-23 MED ORDER — HYDROCODONE-ACETAMINOPHEN 5-325 MG PO TABS: 2.0000 | ORAL_TABLET | ORAL | Status: DC | PRN

## 2014-01-23 MED ORDER — HYDROCODONE-ACETAMINOPHEN 5-325 MG PO TABS
2.0000 | ORAL_TABLET | Freq: Once | ORAL | Status: AC
  Administered 2014-01-23: 2 via ORAL
  Filled 2014-01-23: qty 2

## 2014-01-23 NOTE — ED Provider Notes (Signed)
Medical screening examination/treatment/procedure(s) were performed by non-physician practitioner and as supervising physician I was immediately available for consultation/collaboration.   EKG Interpretation None       Threasa Beards, MD 01/23/14 [id number]

## 2014-01-23 NOTE — ED Notes (Signed)
Patient states she has ride home
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
## 2014-01-23 NOTE — Discharge Instructions (Signed)
Take Vicodin as needed for back pain. Take Flexeril as needed for muscle spasm. You may take these medications together. Follow up with primary care provider on the resource guide. Refer to attached documents for more information.
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
## 2014-01-23 NOTE — Discharge Planning (Signed)
[place] to patient about primary care resources and establishing care with a provider. Patient was given the orange card application and instructed to contact me for an appointment once the application was completed. Patient was also given a resources guide and my contact information for any future questions or concerns. No other needs expressed at this time.

## 2014-04-29 ENCOUNTER — Emergency Department (HOSPITAL_COMMUNITY)
Admission: EM | Admit: 2014-04-29 | Discharge: 2014-04-30 | Disposition: A | Discharge status: Home / Self Care | Payer: No Typology Code available for payment source | Attending: Emergency Medicine | Admitting: Emergency Medicine

## 2014-04-29 DIAGNOSIS — Z79899 Other long term (current) drug therapy: Secondary | ICD-10-CM | POA: Insufficient documentation

## 2014-04-29 DIAGNOSIS — F17200 Nicotine dependence, unspecified, uncomplicated: Secondary | ICD-10-CM | POA: Insufficient documentation

## 2014-04-29 DIAGNOSIS — M545 Low back pain, unspecified: Secondary | ICD-10-CM

## 2014-04-29 NOTE — ED Provider Notes (Signed)
CSN: [id number]     Arrival date & time 04/29/14  2326 History   First MD Initiated Contact with Patient 04/29/14 2332     This chart was scribed for non-physician practitioner, Antonietta Breach, PA-C working with April Alfonso Patten, MD by Forrestine Him, ED Scribe. This patient was seen in room [facility]1/[facility]1 and the patient's care was started at 12:48 AM.   Chief Complaint  Patient presents with  . Back Pain    lower right   The history is provided by the patient. No language interpreter was used.   HPI Comments: Tammy Garrison is a 23 y.o. female who presents to the Emergency Department complaining of intermittent, moderate lower back pain x 2 weeks. No recent injury or trauma. She describes pain as pulling. States she more so feels the pain on the R side of her back. Pain is exacerbated with ambulation without any alleviating factors at this time. She has tried OTC Aleve without any improvement for symptoms. Ms. Danker denies any heavy lifting but admits to a lot of walking while at work. She denies any fever, dysuria, or chills. No weakness or paresthesia to lower extremities. She denies any bowel or urinary incontinence. Pt is not currently followed by an orthopedist. No known allergies to medications.  History reviewed. No pertinent past medical history. History reviewed. No pertinent past surgical history. Family History  Problem Relation Age of Onset  . Hypertension Other    History  Substance Use Topics  . Smoking status: Current Every Day Smoker    Types: Cigarettes  . Smokeless tobacco: Not on file  . Alcohol Use: Yes     Comment: 1 bottle every 2 days   OB History   Grav Para Term Preterm Abortions TAB SAB Ect Mult Living                  Review of Systems  Constitutional: Negative for fever and chills.  Genitourinary: Negative for dysuria.  Musculoskeletal: Positive for back pain.  Neurological: Negative for weakness and numbness.  All other systems reviewed  and are negative.   Allergies  Review of patient's allergies indicates no known allergies.  Home Medications   Prior to Admission medications   Medication Sig Start Date End Date Taking? Authorizing Provider  naproxen sodium (ANAPROX) 220 MG tablet Take 880 mg by mouth 2 (two) times daily as needed (pain).   Yes Historical Provider, MD  diazepam (VALIUM) 5 MG tablet Take 1 tablet (5 mg total) by mouth 2 (two) times daily. 04/30/14   Antonietta Breach, PA-C  HYDROcodone-ibuprofen (VICOPROFEN) 7.5-200 MG per tablet Take 1 tablet by mouth every 6 (six) hours as needed for moderate pain. 04/30/14   Antonietta Breach, PA-C   Triage Vitals: BP 135/77  Pulse 93  Temp(Src) 98.3 F (36.8 C) (Oral)  Resp 15  Ht 6' (1.829 m)  Wt 235 lb (106.595 kg)  BMI 31.86 kg/m2  SpO2 100%  LMP 04/12/2014   Physical Exam  Nursing note and vitals reviewed. Constitutional: She is oriented to person, place, and time. She appears well-developed and well-nourished. No distress.  Nontoxic/nonseptic appearing  HENT:  Head: Normocephalic and atraumatic.  Eyes: Conjunctivae and EOM are normal. No scleral icterus.  Neck: Normal range of motion. Neck supple.  Cardiovascular: Normal rate, regular rhythm and intact distal pulses.   DP and PT pulses 2+ b/l  Pulmonary/Chest: Effort normal. No respiratory distress.  Musculoskeletal: She exhibits tenderness.  Decreased active range of motion secondary to  pain. Patient has tenderness to palpation to her right lumbar paraspinal muscles. No tenderness to palpation of the thoracic or lumbar midline. No bony deformities, step-off, or crepitus.  Neurological: She is alert and oriented to person, place, and time. She exhibits normal muscle tone. Coordination normal.  GCS 15. Speech is goal oriented. No gross sensory deficits appreciated. Patient ambulatory with antalgic gait.  Skin: Skin is warm and dry. No rash noted. She is not diaphoretic. No erythema. No pallor.  Psychiatric: She has  a normal mood and affect. Her behavior is normal.    ED Course  Procedures (including critical care time)  DIAGNOSTIC STUDIES: Oxygen Saturation is 100% on RA, Normal by my interpretation.    COORDINATION OF CARE: 12:48 AM-Discussed treatment plan with pt at bedside and pt agreed to plan.     Labs Review Labs Reviewed - No data to display  Imaging Review No results found.   EKG Interpretation None      MDM   Final diagnoses:  Right-sided low back pain without sciatica    Patient with back pain; hx of similar symptoms. Patient neurovascularly intact. No gross sensory deficits appreciated. Patient ambulatory with slow steady gait independently. No loss of bowel or bladder control. No concern for cauda equina. No fever, h/o cancer, or hx of IVDU. Patient tx in ED with Dilaudid, Toradol, and Valium. RICE protocol and pain medicine indicated and discussed with patient. Orthopedic referral provided and return precautions discussed. Patient agreeable to plan with no unaddressed concerns.  I personally performed the services described in this documentation, which was scribed in my presence. The recorded information has been reviewed and is accurate.    Filed Vitals:   04/29/14 2343  BP: 135/77  Pulse: 93  Temp: 98.3 F (36.8 C)  TempSrc: Oral  Resp: 15  Height: 6' (1.829 m)  Weight: 235 lb (106.595 kg)  SpO2: 100%     Antonietta Breach, PA-C 04/30/14 [phone]

## 2014-04-30 ENCOUNTER — Encounter (HOSPITAL_COMMUNITY): Payer: Self-pay | Admitting: Emergency Medicine

## 2014-04-30 MED ORDER — HYDROMORPHONE HCL 1 MG/ML IJ SOLN
1.0000 mg | Freq: Once | INTRAMUSCULAR | Status: AC
  Administered 2014-04-30: 1 mg via INTRAMUSCULAR
  Filled 2014-04-30: qty 1

## 2014-04-30 MED ORDER — DIAZEPAM 5 MG/ML IJ SOLN
3.7500 mg | Freq: Once | INTRAMUSCULAR | Status: DC
  Filled 2014-04-30: qty 2

## 2014-04-30 MED ORDER — DIAZEPAM 5 MG PO TABS: 5.0000 mg | ORAL_TABLET | Freq: Two times a day (BID) | ORAL | Status: DC

## 2014-04-30 MED ORDER — KETOROLAC TROMETHAMINE 60 MG/2ML IM SOLN
60.0000 mg | Freq: Once | INTRAMUSCULAR | Status: AC
  Administered 2014-04-30: 60 mg via INTRAMUSCULAR
  Filled 2014-04-30: qty 2

## 2014-04-30 MED ORDER — DIAZEPAM 5 MG/ML IJ SOLN
3.7500 mg | Freq: Once | INTRAMUSCULAR | Status: AC
  Administered 2014-04-30: 3.75 mg via INTRAMUSCULAR

## 2014-04-30 MED ORDER — HYDROCODONE-IBUPROFEN 7.5-200 MG PO TABS: 1.0000 | ORAL_TABLET | Freq: Four times a day (QID) | ORAL | Status: DC | PRN

## 2014-04-30 NOTE — ED Provider Notes (Signed)
Medical screening examination/treatment/procedure(s) were performed by non-physician practitioner and as supervising physician I was immediately available for consultation/collaboration.   EKG Interpretation None       Pasco Marchitto K Kyah Buesing-Rasch, MD 04/30/14 [phone]
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
## 2014-04-30 NOTE — Discharge Instructions (Signed)
Recommend you take Vicoprofen and Valium as prescribed. If your pain begins to improve, take ibuprofen instead of Vicoprofen. Do not take ibuprofen, Aleve, or naproxen while taking Vicoprofen as there is already this type of medication in Vicoprofen. Alternate ice and heat to back. Recommend no strenuous activity or heavy lifting for one week. Follow up with an orthopedist for further evaluation of symptoms as needed.  Muscle Strain A muscle strain is an injury that occurs when a muscle is stretched beyond its normal length. Usually a small number of muscle fibers are torn when this happens. Muscle strain is rated in degrees. First-degree strains have the least amount of muscle fiber tearing and pain. Second-degree and third-degree strains have increasingly more tearing and pain.  Usually, recovery from muscle strain takes 1-2 weeks. Complete healing takes 5-6 weeks.  CAUSES  Muscle strain happens when a sudden, violent force placed on a muscle stretches it too far. This may occur with lifting, sports, or a fall.  RISK FACTORS Muscle strain is especially common in athletes.  SIGNS AND SYMPTOMS At the site of the muscle strain, there may be:  Pain.  Bruising.  Swelling.  Difficulty using the muscle due to pain or lack of normal function. DIAGNOSIS  Your health care provider will perform a physical exam and ask about your medical history. TREATMENT  Often, the best treatment for a muscle strain is resting, icing, and applying cold compresses to the injured area.  HOME CARE INSTRUCTIONS   Use the PRICE method of treatment to promote muscle healing during the first 2-3 days after your injury. The PRICE method involves:  Protecting the muscle from being injured again.  Restricting your activity and resting the injured body part.  Icing your injury. To do this, put ice in a plastic bag. Place a towel between your skin and the bag. Then, apply the ice and leave it on from 15-20 minutes each  hour. After the third day, switch to moist heat packs.  Apply compression to the injured area with a splint or elastic bandage. Be careful not to wrap it too tightly. This may interfere with blood circulation or increase swelling.  Elevate the injured body part above the level of your heart as often as you can.  Only take over-the-counter or prescription medicines for pain, discomfort, or fever as directed by your health care provider.  Warming up prior to exercise helps to prevent future muscle strains. SEEK MEDICAL CARE IF:   You have increasing pain or swelling in the injured area.  You have numbness, tingling, or a significant loss of strength in the injured area. MAKE SURE YOU:   Understand these instructions.  Will watch your condition.  Will get help right away if you are not doing well or get worse. Document Released: 07/20/2005 Document Revised: 05/10/2013 Document Reviewed: 02/16/2013 [facility] Patient Information 2015 [place], [place]. This information is not intended to replace advice given to you by your health care provider. Make sure you discuss any questions you have with your health care provider.

## 2014-05-01 ENCOUNTER — Emergency Department (HOSPITAL_COMMUNITY)
Admission: EM | Admit: 2014-05-01 | Discharge: 2014-05-01 | Disposition: A | Discharge status: Home / Self Care | Payer: No Typology Code available for payment source | Attending: Emergency Medicine | Admitting: Emergency Medicine

## 2014-05-01 ENCOUNTER — Encounter (HOSPITAL_COMMUNITY): Payer: Self-pay | Admitting: Emergency Medicine

## 2014-05-01 DIAGNOSIS — IMO0002 Reserved for concepts with insufficient information to code with codable children: Secondary | ICD-10-CM | POA: Insufficient documentation

## 2014-05-01 DIAGNOSIS — Z791 Long term (current) use of non-steroidal anti-inflammatories (NSAID): Secondary | ICD-10-CM | POA: Insufficient documentation

## 2014-05-01 DIAGNOSIS — M545 Low back pain, unspecified: Secondary | ICD-10-CM | POA: Insufficient documentation

## 2014-05-01 DIAGNOSIS — F17200 Nicotine dependence, unspecified, uncomplicated: Secondary | ICD-10-CM | POA: Insufficient documentation

## 2014-05-01 DIAGNOSIS — M5430 Sciatica, unspecified side: Secondary | ICD-10-CM

## 2014-05-01 DIAGNOSIS — Z79899 Other long term (current) drug therapy: Secondary | ICD-10-CM | POA: Insufficient documentation

## 2014-05-01 MED ORDER — PREDNISONE 20 MG PO TABS: ORAL_TABLET | ORAL | Status: DC

## 2014-05-01 NOTE — ED Notes (Signed)
Patient c/o right lower back pain that radiates into the right buttocks. Patient denies any injury, heavy lifting, or fall. Patient denies any numbness or tingling of arms or legs.

## 2014-05-01 NOTE — ED Provider Notes (Signed)
CSN: [id number]     Arrival date & time 05/01/14  1745 History  This chart was scribed for Comer Locket, PA-C working with Ernestina Patches, MD by Evelene Croon, ED Scribe. This patient was seen in room [place] and the patient's care was started at 7:42 PM.   Chief Complaint  Patient presents with  . Back Pain     The history is provided by the patient. No language interpreter was used.    HPI Comments:  Tammy Garrison is a 23 y.o. female who presents to the Emergency Department complaining of moderate sharp shooting lower back pain that started about 2 weeks ago and worsened today. Pt states she woke up with the pain, denies recent injury. The pain radiates down to her right buttock and down the back of her RLE. She has taken aleve without relief. She denies bowel/urinary incontinence. She notes pain is exacerbated by bending over. She was seen here yesterday for the same pain. She was discharged after receiving a "shot" and given prescription for pain meds which she has taken without relief.Pt reports a h/o of same back pain that resolves after a day or two. No back pain red flags.     History reviewed. No pertinent past medical history. History reviewed. No pertinent past surgical history. Family History  Problem Relation Age of Onset  . Hypertension Other    History  Substance Use Topics  . Smoking status: Current Every Day Smoker -- 0.25 packs/day    Types: Cigarettes  . Smokeless tobacco: Never Used  . Alcohol Use: Yes     Comment: 1 bottle every 2 days   OB History   Grav Para Term Preterm Abortions TAB SAB Ect Mult Living                 Review of Systems  Musculoskeletal: Positive for back pain.  All other systems reviewed and are negative.     Allergies  Review of patient's allergies indicates no known allergies.  Home Medications   Prior to Admission medications   Medication Sig Start Date End Date Taking? Authorizing Provider  diazepam  (VALIUM) 5 MG tablet Take 1 tablet (5 mg total) by mouth 2 (two) times daily. 04/30/14  Yes Antonietta Breach, PA-C  HYDROcodone-ibuprofen (VICOPROFEN) 7.5-200 MG per tablet Take 1 tablet by mouth every 6 (six) hours as needed for moderate pain. 04/30/14  Yes Antonietta Breach, PA-C  naproxen sodium (ANAPROX) 220 MG tablet Take 880 mg by mouth 2 (two) times daily as needed (pain).   Yes Historical Provider, MD  predniSONE (DELTASONE) 20 MG tablet 3 tabs po daily x 3 days, then 2 tabs x 3 days, then 1.5 tabs x 3 days, then 1 tab x 3 days, then 0.5 tabs x 3 days 05/01/14   Viona Gilmore Nikaya Nasby, PA-C   BP 136/81  Pulse 78  Temp(Src) 98.8 F (37.1 C) (Oral)  Resp 18  Ht 6' (1.829 m)  Wt 220 lb (99.791 kg)  BMI 29.83 kg/m2  SpO2 100%  LMP 04/12/2014 Physical Exam  Nursing note and vitals reviewed. Constitutional: She is oriented to person, place, and time. She appears well-developed and well-nourished.  HENT:  Head: Normocephalic and atraumatic.  Neck: Normal range of motion.  Cardiovascular: Normal rate, regular rhythm and normal heart sounds.  Exam reveals no gallop.   No murmur heard. Pulmonary/Chest: Effort normal and breath sounds normal. No respiratory distress. She has no wheezes. She has no rales.  Abdominal: Soft.  Musculoskeletal:  Extension of RLE reproduces sharp shooting pain, as well of flex at the waist of lumbar spine causes sharp shooting pain down the back of her RLE.   Neurological: She is alert and oriented to person, place, and time.  Pt able to ambulate on her own with somewhat antalgic gait but no ataxia.  Skin: Skin is warm and dry.  Psychiatric: She has a normal mood and affect. Her behavior is normal.    ED Course  Procedures   DIAGNOSTIC STUDIES:  Oxygen Saturation is 100% on RA, normal by my interpretation.    COORDINATION OF CARE:  7:47 PM Discussed treatment plan with pt at bedside and pt agreed to plan.  Labs Review Labs Reviewed - No data to display  Imaging  Review No results found.   EKG Interpretation None      MDM  Pt resting comfortably in ED - vitals stable - WNL- afebrile Leg pain most consistent with Sciatica. No concern for frx, dislocation or other spinal cord pathology. No skin lesions or rash apparent Will DC with Prednisone 2 week taper. Discussed plan of care, f/u with PCP, pt amenable to plan. Pt stable, in good condition and appropriate for DC. Final diagnoses:  Sciatic leg pain        Verl Dicker, PA-C 05/02/14 1125

## 2014-05-02 NOTE — ED Provider Notes (Signed)
Medical screening examination/treatment/procedure(s) were performed by non-physician practitioner and as supervising physician I was immediately available for consultation/collaboration.  Ernestina Patches, MD 05/02/14 [phone]

## 2014-09-24 IMAGING — CT CT ABD-PELV W/O CM
1 series · 15 of 24 positions shown, 20 images · non-contrast
Comparison: None.

CLINICAL DATA: Hematuria

EXAM:
CT ABDOMEN AND PELVIS WITHOUT CONTRAST
TECHNIQUE: Multidetector CT imaging of the abdomen and pelvis was performed
following the standard protocol without intravenous contrast.

[Series 6: lung · axial · 0.82mm/px · z∈[+1601,+1706]mm · 15 of 24 slices shown, 20 images]
[im 2/24  soft-tissue]
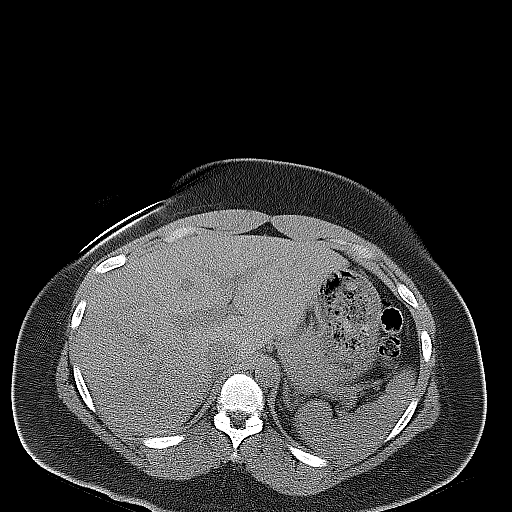
[im 2/24  bone]
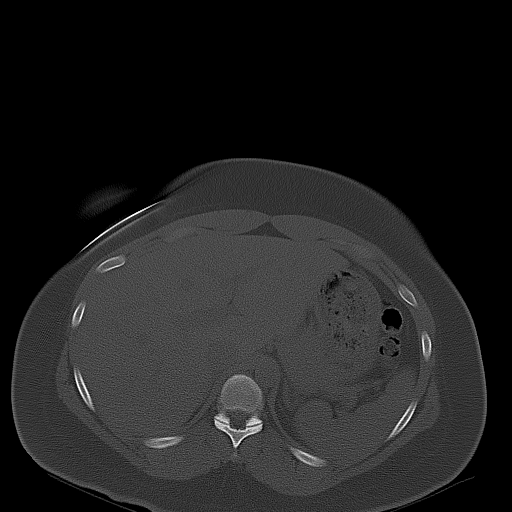
[im 4/24  soft-tissue]
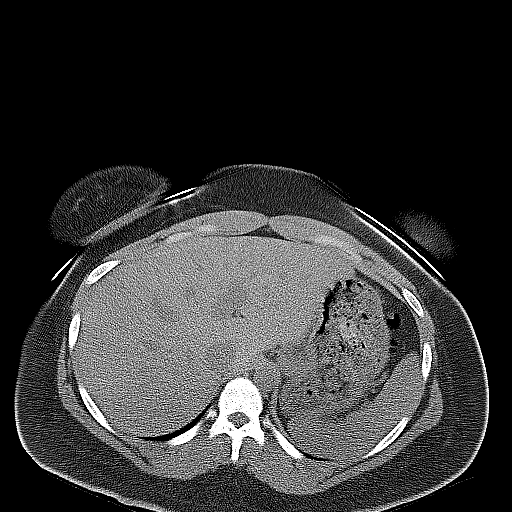
[im 5/24  soft-tissue]
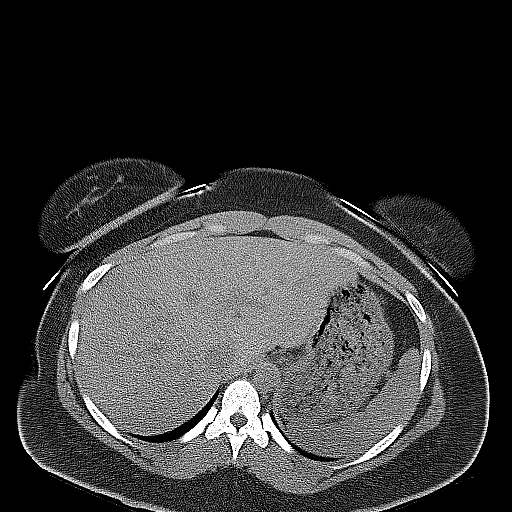
[im 7/24  soft-tissue]
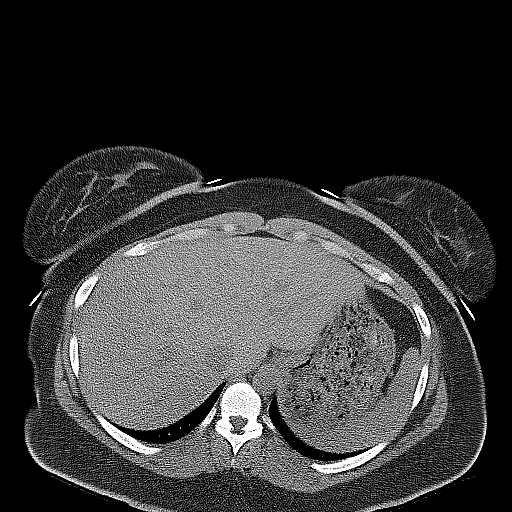
[im 9/24  soft-tissue]
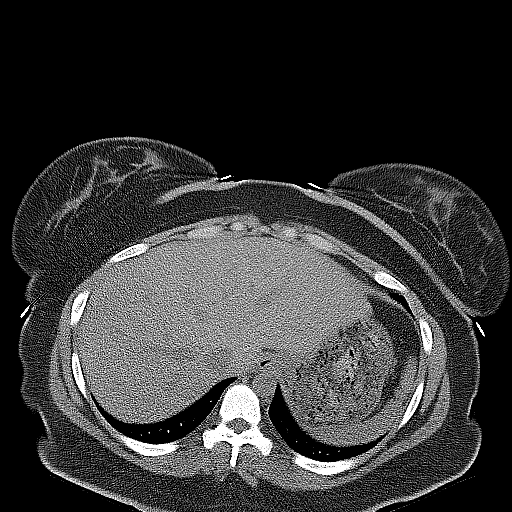
[im 10/24  soft-tissue]
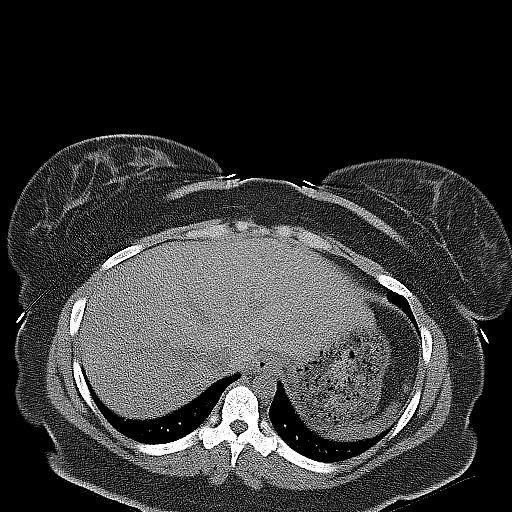
[im 12/24  soft-tissue]
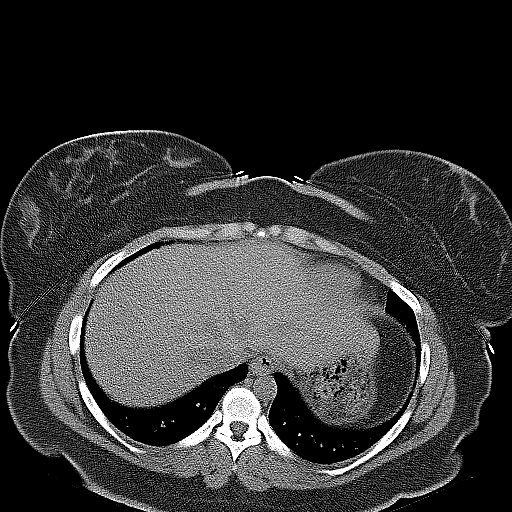
[im 13/24  soft-tissue]
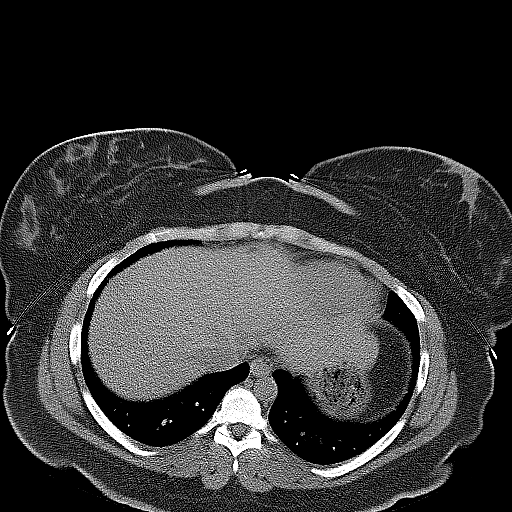
[im 15/24  soft-tissue]
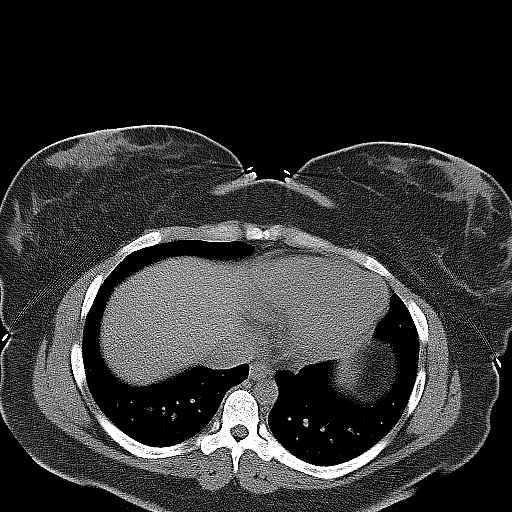
[im 15/24  bone]
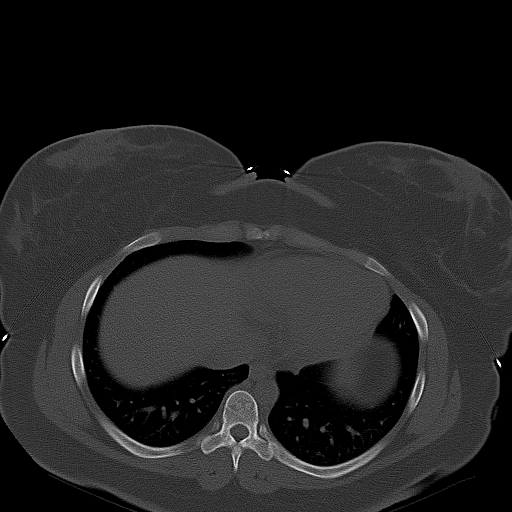
[im 16/24  soft-tissue]
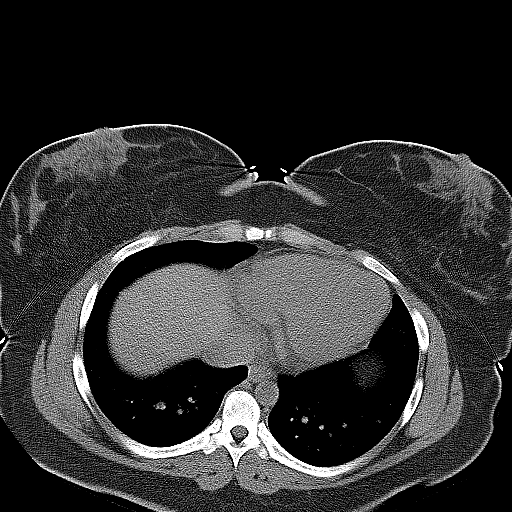
[im 18/24  soft-tissue]
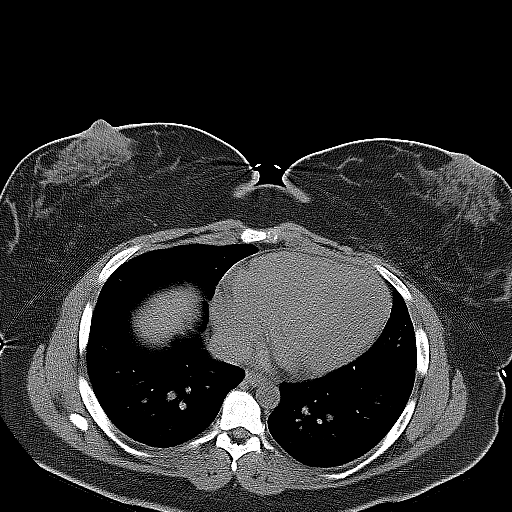
[im 20/24  soft-tissue]
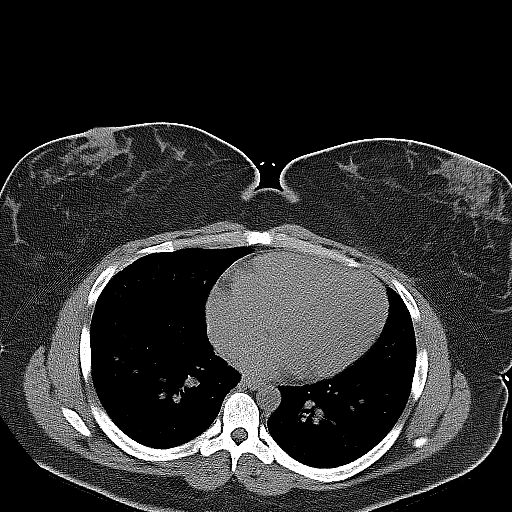
[im 20/24  lung]
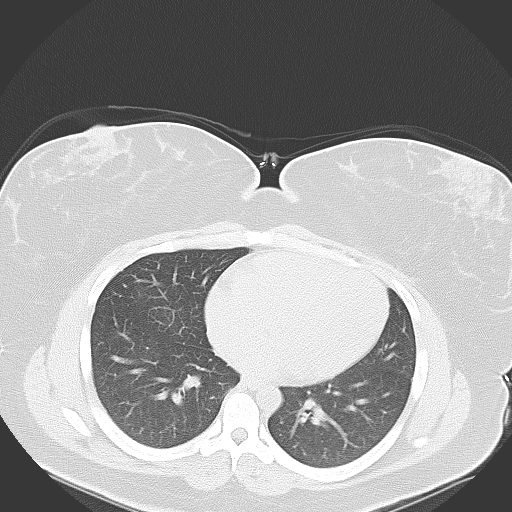
[im 21/24  soft-tissue]
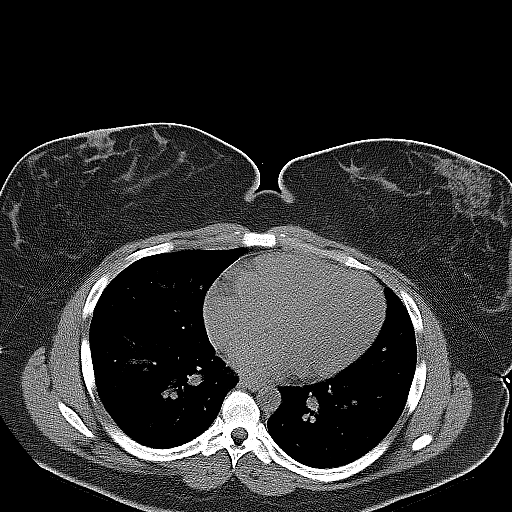
[im 21/24  lung]
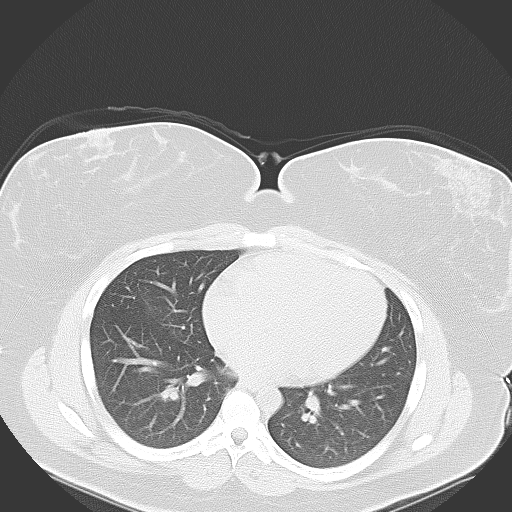
[im 22/24  lung]
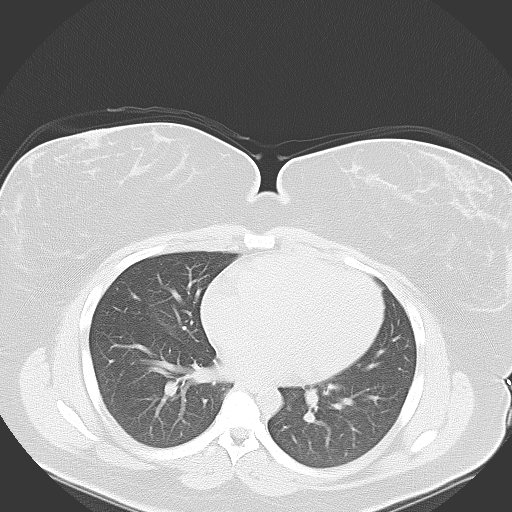
[im 23/24  soft-tissue]
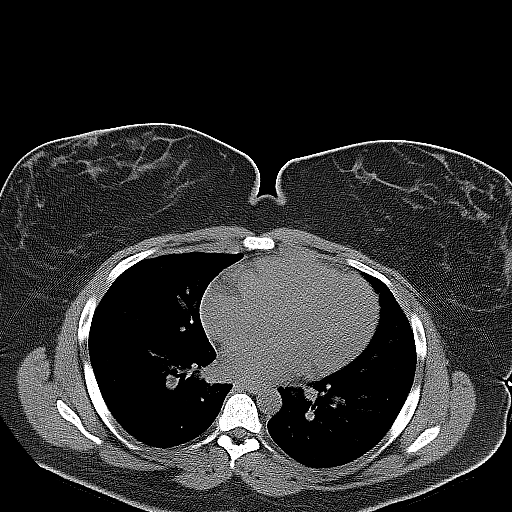
[im 23/24  lung]
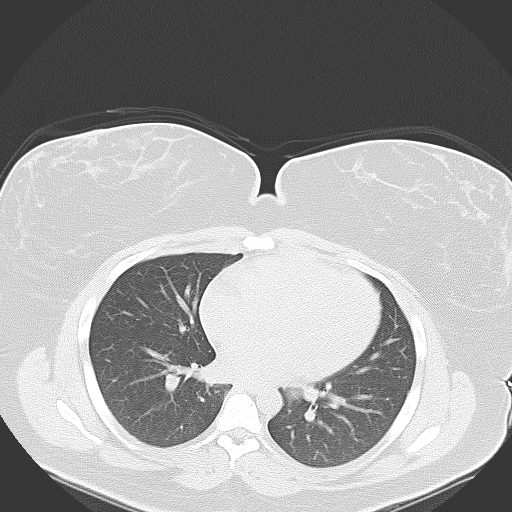

[15 of 24 positions shown; findings below may reference images not displayed]

FINDINGS: The liver, spleen, pancreas, gallbladder, adrenal glands and kidneys
are normal. There is no nephrolithiasis or hydroureteronephrosis
bilaterally. The aorta is normal. There is no abdominal
lymphadenopathy. There is no small bowel obstruction or
diverticulitis. The appendix is normal.

Fluid-filled bladder is normal. Pelvic phleboliths are identified.
The uterus is normal. The ovaries are normal. There is a 2 mm
calcified granuloma in the left lung base. There is no focal
pneumonia or pleural effusion is visualized lung bases. No acute
abnormalities identified within the visualized bones.
IMPRESSION: No acute abnormality in the abdomen and pelvis.

## 2016-05-18 ENCOUNTER — Emergency Department (HOSPITAL_COMMUNITY): Payer: Managed Care, Other (non HMO)

## 2016-05-18 ENCOUNTER — Emergency Department (HOSPITAL_COMMUNITY)
Admission: EM | Admit: 2016-05-18 | Discharge: 2016-05-19 | Disposition: A | Discharge status: Other Acute Inpatient Hospital | Payer: Managed Care, Other (non HMO) | Attending: Emergency Medicine | Admitting: Emergency Medicine

## 2016-05-18 ENCOUNTER — Ambulatory Visit (HOSPITAL_COMMUNITY)
Admission: EM | Admit: 2016-05-18 | Discharge: 2016-05-18 | Disposition: A | Discharge status: Other Acute Inpatient Hospital | Payer: No Typology Code available for payment source

## 2016-05-18 ENCOUNTER — Encounter (HOSPITAL_COMMUNITY): Payer: Self-pay | Admitting: *Deleted

## 2016-05-18 DIAGNOSIS — Y999 Unspecified external cause status: Secondary | ICD-10-CM | POA: Diagnosis not present

## 2016-05-18 DIAGNOSIS — Y939 Activity, unspecified: Secondary | ICD-10-CM | POA: Diagnosis not present

## 2016-05-18 DIAGNOSIS — S50812A Abrasion of left forearm, initial encounter: Secondary | ICD-10-CM | POA: Insufficient documentation

## 2016-05-18 DIAGNOSIS — R51 Headache: Secondary | ICD-10-CM | POA: Diagnosis not present

## 2016-05-18 DIAGNOSIS — Y929 Unspecified place or not applicable: Secondary | ICD-10-CM | POA: Insufficient documentation

## 2016-05-18 DIAGNOSIS — T39311A Poisoning by propionic acid derivatives, accidental (unintentional), initial encounter: Secondary | ICD-10-CM | POA: Diagnosis not present

## 2016-05-18 DIAGNOSIS — X838XXA Intentional self-harm by other specified means, initial encounter: Secondary | ICD-10-CM | POA: Insufficient documentation

## 2016-05-18 DIAGNOSIS — S59912A Unspecified injury of left forearm, initial encounter: Secondary | ICD-10-CM | POA: Diagnosis present

## 2016-05-18 DIAGNOSIS — T1491XA Suicide attempt, initial encounter: Secondary | ICD-10-CM

## 2016-05-18 DIAGNOSIS — T39312A Poisoning by propionic acid derivatives, intentional self-harm, initial encounter: Secondary | ICD-10-CM

## 2016-05-18 DIAGNOSIS — F17210 Nicotine dependence, cigarettes, uncomplicated: Secondary | ICD-10-CM | POA: Insufficient documentation

## 2016-05-18 HISTORY — DX: Personal history of suicidal behavior: Z91.51

## 2016-05-18 HISTORY — DX: Alcohol abuse, uncomplicated: F10.10

## 2016-05-18 HISTORY — DX: Cannabis abuse, uncomplicated: F12.10

## 2016-05-18 HISTORY — DX: Personal history of self-harm: Z91.5

## 2016-05-18 HISTORY — DX: Bipolar disorder, unspecified: F31.9

## 2016-05-18 HISTORY — DX: Reserved for concepts with insufficient information to code with codable children: IMO0002

## 2016-05-18 LAB — CBC
HCT: 37.6 % (ref 36.0–46.0)
HEMOGLOBIN: 12.3 g/dL (ref 12.0–15.0)
MCH: 28.5 pg (ref 26.0–34.0)
MCHC: 32.7 g/dL (ref 30.0–36.0)
MCV: 87 fL (ref 78.0–100.0)
Platelets: 407 10*3/uL — ABNORMAL HIGH (ref 150–400)
RBC: 4.32 MIL/uL (ref 3.87–5.11)
RDW: 14.2 % (ref 11.5–15.5)
WBC: 7.3 10*3/uL (ref 4.0–10.5)

## 2016-05-18 LAB — ACETAMINOPHEN LEVEL
Acetaminophen (Tylenol), Serum: 13 ug/mL (ref 10–30)
Acetaminophen (Tylenol), Serum: <10 ug/mL — ABNORMAL LOW (ref 10–30)

## 2016-05-18 LAB — RAPID URINE DRUG SCREEN, HOSP PERFORMED
Amphetamines: NOT DETECTED
BARBITURATES: NOT DETECTED
Benzodiazepines: NOT DETECTED
Cocaine: NOT DETECTED
Opiates: NOT DETECTED
Tetrahydrocannabinol: POSITIVE — AB

## 2016-05-18 LAB — HEPATIC FUNCTION PANEL
ALBUMIN: 4.3 g/dL (ref 3.5–5.0)
ALK PHOS: 59 U/L (ref 38–126)
ALT: 16 U/L (ref 14–54)
AST: 17 U/L (ref 15–41)
BILIRUBIN TOTAL: 1 mg/dL (ref 0.3–1.2)
Bilirubin, Direct: 0.1 mg/dL (ref 0.1–0.5)
Indirect Bilirubin: 0.9 mg/dL (ref 0.3–0.9)
Total Protein: 7 g/dL (ref 6.5–8.1)

## 2016-05-18 LAB — COMPREHENSIVE METABOLIC PANEL
ALBUMIN: 4.3 g/dL (ref 3.5–5.0)
ALK PHOS: 57 U/L (ref 38–126)
ALT: 18 U/L (ref 14–54)
ANION GAP: 8 (ref 5–15)
AST: 28 U/L (ref 15–41)
BILIRUBIN TOTAL: 1.6 mg/dL — AB (ref 0.3–1.2)
BUN: 6 mg/dL (ref 6–20)
CALCIUM: 9.6 mg/dL (ref 8.9–10.3)
CO2: 22 mmol/L (ref 22–32)
CREATININE: 0.76 mg/dL (ref 0.44–1.00)
Chloride: 108 mmol/L (ref 101–111)
GFR calc Af Amer: >60 mL/min (ref >60)
GFR calc non Af Amer: >60 mL/min (ref >60)
GLUCOSE: 98 mg/dL (ref 65–99)
Potassium: 4.5 mmol/L (ref 3.5–5.1)
Sodium: 138 mmol/L (ref 135–145)
TOTAL PROTEIN: 7.7 g/dL (ref 6.5–8.1)

## 2016-05-18 LAB — SALICYLATE LEVEL: Salicylate Lvl: <7 mg/dL (ref 2.8–30.0)

## 2016-05-18 LAB — I-STAT BETA HCG BLOOD, ED (MC, WL, AP ONLY)

## 2016-05-18 LAB — ETHANOL: Alcohol, Ethyl (B): <5 mg/dL (ref <5)

## 2016-05-18 MED ORDER — METOCLOPRAMIDE HCL 10 MG PO TABS
10.0000 mg | ORAL_TABLET | Freq: Once | ORAL | Status: AC
  Administered 2016-05-19: 10 mg via ORAL
  Filled 2016-05-18: qty 1

## 2016-05-18 MED ORDER — DIPHENHYDRAMINE HCL 25 MG PO CAPS
50.0000 mg | ORAL_CAPSULE | Freq: Once | ORAL | Status: AC
  Administered 2016-05-19: 50 mg via ORAL
  Filled 2016-05-18: qty 2

## 2016-05-18 MED ORDER — GI COCKTAIL ~~LOC~~
30.0000 mL | Freq: Once | ORAL | Status: AC
  Administered 2016-05-18: 30 mL via ORAL
  Filled 2016-05-18: qty 30

## 2016-05-18 NOTE — ED Notes (Signed)
Patient transported to CT at this time via ED stretcher. Pt in no apparent distress at this time.   

## 2016-05-18 NOTE — ED Notes (Signed)
Pt's cell phone charger given to pt's friend at this time per pt's request. All other belongings remain secured at this time.

## 2016-05-18 NOTE — ED Notes (Signed)
Pt has been changed into scrubs, wanded and called for sitter.

## 2016-05-18 NOTE — ED Notes (Signed)
Contacted by poison control at this time.  Per Langley Gauss with poison control, they will close out pt's chart at this time.  They have no further recommendations at this time.

## 2016-05-18 NOTE — ED Notes (Signed)
MD at bedside. 
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
## 2016-05-18 NOTE — ED Notes (Signed)
Pt returned from CT at this time. Pt in no apparent distress at this time.  Will continue to closely monitor pt.

## 2016-05-18 NOTE — ED Triage Notes (Signed)
PT very tearful and state she has been vomiting since last nite. PT took a lot of pills and states she took 500mg  ibuprofens 40 plus at midnight. Pt was trying to hurt herself. Pt took some sleeping pills times 2 last nite. Pt states she usually cuts herself.

## 2016-05-18 NOTE — ED Notes (Signed)
Pt transported to CT ?

## 2016-05-18 NOTE — ED Notes (Signed)
Dinner tray ordered.

## 2016-05-19 ENCOUNTER — Encounter (HOSPITAL_COMMUNITY): Payer: Self-pay | Admitting: *Deleted

## 2016-05-19 ENCOUNTER — Encounter (HOSPITAL_COMMUNITY): Payer: Self-pay

## 2016-05-19 ENCOUNTER — Inpatient Hospital Stay (HOSPITAL_COMMUNITY)
Admission: AD | Admit: 2016-05-19 | Discharge: 2016-05-21 | DRG: 881 | Disposition: A | Discharge status: Home / Self Care | Payer: Managed Care, Other (non HMO) | Source: Intra-hospital | Attending: Psychiatry | Admitting: Psychiatry

## 2016-05-19 DIAGNOSIS — F329 Major depressive disorder, single episode, unspecified: Secondary | ICD-10-CM | POA: Diagnosis present

## 2016-05-19 DIAGNOSIS — R45851 Suicidal ideations: Secondary | ICD-10-CM | POA: Diagnosis present

## 2016-05-19 DIAGNOSIS — S50812A Abrasion of left forearm, initial encounter: Secondary | ICD-10-CM | POA: Diagnosis not present

## 2016-05-19 DIAGNOSIS — Z915 Personal history of self-harm: Secondary | ICD-10-CM | POA: Diagnosis not present

## 2016-05-19 DIAGNOSIS — Z818 Family history of other mental and behavioral disorders: Secondary | ICD-10-CM | POA: Diagnosis not present

## 2016-05-19 DIAGNOSIS — F17210 Nicotine dependence, cigarettes, uncomplicated: Secondary | ICD-10-CM | POA: Diagnosis present

## 2016-05-19 DIAGNOSIS — Z8249 Family history of ischemic heart disease and other diseases of the circulatory system: Secondary | ICD-10-CM | POA: Diagnosis not present

## 2016-05-19 DIAGNOSIS — F32A Depression, unspecified: Secondary | ICD-10-CM | POA: Diagnosis present

## 2016-05-19 DIAGNOSIS — F1220 Cannabis dependence, uncomplicated: Secondary | ICD-10-CM | POA: Diagnosis present

## 2016-05-19 DIAGNOSIS — Z79899 Other long term (current) drug therapy: Secondary | ICD-10-CM | POA: Diagnosis not present

## 2016-05-19 MED ORDER — LORAZEPAM 1 MG PO TABS: 0.0000 mg | ORAL_TABLET | Freq: Two times a day (BID) | ORAL | Status: DC

## 2016-05-19 MED ORDER — MAGNESIUM HYDROXIDE 400 MG/5ML PO SUSP: 30.0000 mL | Freq: Every day | ORAL | Status: DC | PRN

## 2016-05-19 MED ORDER — ACETAMINOPHEN 325 MG PO TABS
650.0000 mg | ORAL_TABLET | Freq: Four times a day (QID) | ORAL | Status: DC | PRN
  Administered 2016-05-20: 650 mg via ORAL
  Filled 2016-05-19: qty 2

## 2016-05-19 MED ORDER — LORAZEPAM 1 MG PO TABS
1.0000 mg | ORAL_TABLET | Freq: Three times a day (TID) | ORAL | Status: DC | PRN
  Administered 2016-05-19: 1 mg via ORAL
  Filled 2016-05-19 (×2): qty 1

## 2016-05-19 MED ORDER — ONDANSETRON HCL 4 MG PO TABS: 4.0000 mg | ORAL_TABLET | Freq: Three times a day (TID) | ORAL | Status: DC | PRN

## 2016-05-19 MED ORDER — ZOLPIDEM TARTRATE 5 MG PO TABS: 5.0000 mg | ORAL_TABLET | Freq: Every evening | ORAL | Status: DC | PRN

## 2016-05-19 MED ORDER — ACETAMINOPHEN 325 MG PO TABS: 650.0000 mg | ORAL_TABLET | ORAL | Status: DC | PRN

## 2016-05-19 MED ORDER — VITAMIN B-1 100 MG PO TABS
100.0000 mg | ORAL_TABLET | Freq: Every day | ORAL | Status: DC
  Administered 2016-05-20 – 2016-05-21 (×2): 100 mg via ORAL
  Filled 2016-05-19 (×4): qty 1

## 2016-05-19 MED ORDER — VITAMIN B-1 100 MG PO TABS
100.0000 mg | ORAL_TABLET | Freq: Every day | ORAL | Status: DC
  Administered 2016-05-19: 100 mg via ORAL
  Filled 2016-05-19: qty 1

## 2016-05-19 MED ORDER — LORAZEPAM 1 MG PO TABS: 0.0000 mg | ORAL_TABLET | Freq: Four times a day (QID) | ORAL | Status: DC

## 2016-05-19 MED ORDER — LORAZEPAM 1 MG PO TABS
1.0000 mg | ORAL_TABLET | Freq: Three times a day (TID) | ORAL | Status: DC | PRN
  Administered 2016-05-19: 1 mg via ORAL
  Filled 2016-05-19: qty 1

## 2016-05-19 MED ORDER — ALUM & MAG HYDROXIDE-SIMETH 200-200-20 MG/5ML PO SUSP: 30.0000 mL | ORAL | Status: DC | PRN

## 2016-05-19 MED ORDER — THIAMINE HCL 100 MG/ML IJ SOLN: 100.0000 mg | Freq: Every day | INTRAMUSCULAR | Status: DC

## 2016-05-19 MED ORDER — NICOTINE 21 MG/24HR TD PT24: 21.0000 mg | MEDICATED_PATCH | Freq: Every day | TRANSDERMAL | Status: DC

## 2016-05-19 MED ORDER — ZOLPIDEM TARTRATE 5 MG PO TABS
5.0000 mg | ORAL_TABLET | Freq: Every evening | ORAL | Status: DC | PRN
  Administered 2016-05-20 (×2): 5 mg via ORAL
  Filled 2016-05-19 (×2): qty 1

## 2016-05-19 NOTE — ED Notes (Signed)
Pt's mother came to visit pt. Security escorted her to pt's room so she may view pt d/t pt sleeping. Gave mother info sheet re: Medical Clearance Pt policies. Advised she will return at 1230 visitation.

## 2016-05-19 NOTE — ED Notes (Signed)
Pt on phone at nurses' desk. Lunch tray delivered to pt.

## 2016-05-19 NOTE — ED Notes (Signed)
Requested to take Ativan after showers.

## 2016-05-19 NOTE — ED Notes (Signed)
Per Claudie Leach, [facility] - pt accepted to [address] Parke Poisson - may arrive after 1530.

## 2016-05-19 NOTE — Progress Notes (Signed)
Adult Psychoeducational Group Note  Date:  05/19/2016 Time:  9:50 PM  Group Topic/Focus:  Wrap-Up Group:   The focus of this group is to help patients review their daily goal of treatment and discuss progress on daily workbooks.   Participation Level:  Active  Participation Quality:  Appropriate  Affect:  Appropriate  Cognitive:  Alert  Insight: Appropriate  Engagement in Group:  Engaged  Modes of Intervention:  Discussion  Additional Comments:  Patient states, "my day was long". Patient's goal for today was "to go home, but I need the help".  Wylder Macomber L Demri Poulton 05/19/2016, 9:50 PM

## 2016-05-19 NOTE — ED Provider Notes (Signed)
  Physical Exam  BP 141/93   Pulse 93   Temp 99.1 F (37.3 C) (Oral)   Resp 20   Ht 5\' 11"  (1.803 m)   Wt 271 lb 2 oz (123 kg)   LMP 05/05/2016   SpO2 98%   BMI 37.81 kg/m   Physical Exam  ED Course  Procedures  MDM Accepted at [facility] by dr Parke Poisson.       Davonna Belling, MD 05/19/16 [phone]

## 2016-05-19 NOTE — ED Notes (Signed)
Report given to Freida Busman, RN at this time.  Receiving nurse denies having any further questions at this time.

## 2016-05-19 NOTE — ED Notes (Addendum)
RN spoke w/pt for approx 20 minutes. Pt initially stated she did not think she felt she needed inpt tx. After discussing w/pt further, she voiced agreement w/tx plan - accepted to [facility]. 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 States she lives w/her boyfriend and is concerned about how he feels about her now and the possibility of losing her job d/t being in hospital. Pt was tearful initially and has remained calm, cooperative. Voiced understanding of Medical Clearance Pt policies. Pt spoke w/her boyfriend on the phone at nurses' desk and states she feels better now. States he is coming to visit at 1230. States feels has headache d/t mind racing w/multiple thoughts. States she is trying to keep herself calm and make it stop. Advised she drinks either 2 glasses or 1 bottle of wine almost daily. States has not ever had any withdrawal symptoms and when she has gone for days w/o ETOH, states she does not have withdrawals.

## 2016-05-19 NOTE — Progress Notes (Signed)
Pt is new to the unit this afternoon around dinnertime.  Pt reports she was admitted after taking an overdose, but does no know why she did it.  She denies any stressors at this time.  She is focused now on being discharged, saying "I just want to go home".  Pt denies SI/HI/AVH.  She has been pleasant and appropriate since being brought on the unit.  Writer reviewed the meds that were available to her, and offered her Ativan for her anxiety.  Pt was given Ativan 1 mg at 2030 which she says was helpful.  Support and encouragement offered.  Pt was encouraged to make her needs known to staff.  She was encouraged to speak to the doctor about any additional questions she had on medications.  Discharge plans are in process.  Safety maintained with q15 minute checks.

## 2016-05-19 NOTE — ED Notes (Signed)
Pt asked to speak with RN. "First, that clock on the wall says eight o'clock and is wrong and going to drive me crazy. Second, this room doesn't even have a phone. Y'all took my phone from me and I don't even have any numbers." When I explained that our policy requires that we keep all belongings in a secure location, and that phone calls are limited to two (2) 31min-calls from the phone at the desk.   Pt replied, "Can I leave? Because I can't do this. I need my phone."

## 2016-05-19 NOTE — ED Notes (Signed)
Notified by Behavioral health at this time that pt meets criteria for inpatient admission and they are currently seeking placement for the pt.

## 2016-05-19 NOTE — ED Notes (Signed)
Sitter that know the pt spoke with her to help calm pt down.   Gave pt her phone, paper, and pen so that she could write down any numbers she believes that she might need. Sitter brought pt's cell phone back to nurse's station to be placed back with pt's belongings.

## 2016-05-19 NOTE — Tx Team (Signed)
Initial Treatment Plan 05/19/2016 9:15 PM [name] [id number]    PATIENT STRESSORS: Financial difficulties Health problems   PATIENT STRENGTHS: Capable of independent living Communication skills General fund of knowledge Motivation for treatment/growth   PATIENT IDENTIFIED PROBLEMS: Depression  Anxiety  Suicidal ideation/attempt  Substance abuse  "Sleep better"  "Learn breathing techniques"           DISCHARGE CRITERIA:  Improved stabilization in mood, thinking, and/or behavior Verbal commitment to aftercare and medication compliance  PRELIMINARY DISCHARGE PLAN: Outpatient therapy Medication management  PATIENT/FAMILY INVOLVEMENT: This treatment plan has been presented to and reviewed with the patient, Tammy Garrison .  The patient and family have been given the opportunity to ask questions and make suggestions.  Windell Moment, RN 05/19/2016, 9:15 PM

## 2016-05-19 NOTE — ED Notes (Signed)
Pt noted w/outside food on bedside table - [employer] sub, unopened chips, and unopened drink. Will leave on table at this time and discuss w/pt when awakens d/t pt noted to be upset prior to falling asleep  - per report.

## 2016-05-19 NOTE — ED Notes (Signed)
Pt signed consent forms - faxed to [facility], original placed in folder for [facility], and copy sent to medical records.
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
## 2016-05-19 NOTE — Progress Notes (Signed)
Tammy Garrison is a 25 year old female being admitted voluntarily to [facility] from [facility].  She came to the ED after reporting ingesting 40, 500 mg ibuprofen tablets.  She has a history of suicide attempts in the past.  She denies any current stressors.  She is reporting difficulty eating and sleeping.  She admitted to hearing voices yesterday but denies any voices today.  She reports that when she is alone she has more time to think and that is when the suicidal thoughts start.  She denies any medication issues and appears to be in no physical distress.  Oriented her to the unit.  Admission paperwork completed and signed.  Belongings searched and secured in locker # 96.  Skin assessment completed and no skin issues noted.  Q 15 minute checks initiated for safety.  We will monitor the progress towards her goals.

## 2016-05-19 NOTE — ED Notes (Signed)
Patient was given a snack and drink, A regular diet ordered for lunch. 

## 2016-05-20 DIAGNOSIS — Z79899 Other long term (current) drug therapy: Secondary | ICD-10-CM

## 2016-05-20 DIAGNOSIS — F329 Major depressive disorder, single episode, unspecified: Principal | ICD-10-CM

## 2016-05-20 DIAGNOSIS — Z8249 Family history of ischemic heart disease and other diseases of the circulatory system: Secondary | ICD-10-CM

## 2016-05-20 DIAGNOSIS — F32A Depression, unspecified: Secondary | ICD-10-CM | POA: Diagnosis present

## 2016-05-20 MED ORDER — IBUPROFEN 600 MG PO TABS
600.0000 mg | ORAL_TABLET | Freq: Four times a day (QID) | ORAL | Status: DC | PRN
  Administered 2016-05-20: 600 mg via ORAL
  Filled 2016-05-20: qty 1

## 2016-05-20 MED ORDER — BUTALBITAL-APAP-CAFFEINE 50-325-40 MG PO TABS
1.0000 | ORAL_TABLET | Freq: Four times a day (QID) | ORAL | Status: DC | PRN
  Administered 2016-05-20 – 2016-05-21 (×3): 1 via ORAL
  Filled 2016-05-20 (×3): qty 1

## 2016-05-20 MED ORDER — NICOTINE 21 MG/24HR TD PT24
21.0000 mg | MEDICATED_PATCH | Freq: Every day | TRANSDERMAL | Status: DC
  Filled 2016-05-20 (×4): qty 1

## 2016-05-20 MED ORDER — HYDROXYZINE HCL 25 MG PO TABS
25.0000 mg | ORAL_TABLET | Freq: Three times a day (TID) | ORAL | Status: DC | PRN
  Administered 2016-05-20 – 2016-05-21 (×3): 25 mg via ORAL
  Filled 2016-05-20 (×3): qty 1

## 2016-05-20 NOTE — Tx Team (Signed)
Interdisciplinary Treatment and Diagnostic Plan Update  05/20/2016 Time of Session: 9:30AM Tammy Garrison MRN: [id number]  Principal Diagnosis: Depression  Secondary Diagnoses: Principal Problem:   Depression Active Problems:   Marijuana dependence ([place])   Current Medications:  Current Facility-Administered Medications  Medication Dose Route Frequency Provider Last Rate Last Dose  . acetaminophen (TYLENOL) tablet 650 mg  650 mg Oral Q6H PRN Niel Hummer, NP   650 mg at 05/20/16 0043  . alum & mag hydroxide-simeth (MAALOX/MYLANTA) 200-200-20 MG/5ML suspension 30 mL  30 mL Oral Q4H PRN Niel Hummer, NP      . butalbital-acetaminophen-caffeine (FIORICET, ESGIC) [phone] MG per tablet 1 tablet  1 tablet Oral Q6H PRN Kerrie Buffalo, NP   1 tablet at 05/20/16 1354  . hydrOXYzine (ATARAX/VISTARIL) tablet 25 mg  25 mg Oral TID PRN Linard Millers, MD      . ibuprofen (ADVIL,MOTRIN) tablet 600 mg  600 mg Oral Q6H PRN Laverle Hobby, PA-C   600 mg at 05/20/16 [id number]  . magnesium hydroxide (MILK OF MAGNESIA) suspension 30 mL  30 mL Oral Daily PRN Niel Hummer, NP      . nicotine (NICODERM CQ - dosed in mg/24 hours) patch 21 mg  21 mg Transdermal Daily Laverle Hobby, PA-C      . ondansetron [facility]) tablet 4 mg  4 mg Oral Q8H PRN Niel Hummer, NP      . thiamine (VITAMIN B-1) tablet 100 mg  100 mg Oral Daily Niel Hummer, NP   100 mg at 05/20/16 0815  . zolpidem (AMBIEN) tablet 5 mg  5 mg Oral QHS PRN Niel Hummer, NP   5 mg at 05/20/16 0043   PTA Medications: Prescriptions Prior to Admission  Medication Sig Dispense Refill Last Dose  . doxylamine, Sleep, (SLEEP AID) 25 MG tablet Take 50 mg by mouth at bedtime.   05/17/2016 at Unknown time  . ibuprofen (ADVIL,MOTRIN) 200 MG tablet Take 400 mg by mouth every 6 (six) hours as needed.   05/18/2016 at Unknown time  . diazepam (VALIUM) 5 MG tablet Take 1 tablet (5 mg total) by mouth 2 (two) times daily. (Patient not taking:  Reported on 05/20/2016) 10 tablet 0 Not Taking at Unknown time  . HYDROcodone-ibuprofen (VICOPROFEN) 7.5-200 MG per tablet Take 1 tablet by mouth every 6 (six) hours as needed for moderate pain. (Patient not taking: Reported on 05/20/2016) 13 tablet 0 Not Taking at Unknown time  . predniSONE (DELTASONE) 20 MG tablet 3 tabs po daily x 3 days, then 2 tabs x 3 days, then 1.5 tabs x 3 days, then 1 tab x 3 days, then 0.5 tabs x 3 days (Patient not taking: Reported on 05/20/2016) 27 tablet 0 Not Taking at Unknown time    Patient Stressors: Financial difficulties Health problems  Patient Strengths: Capable of independent living Curator fund of knowledge Motivation for treatment/growth  Treatment Modalities: Medication Management, Group therapy, Case management,  1 to 1 session with clinician, Psychoeducation, Recreational therapy.   Physician Treatment Plan for Primary Diagnosis: Depression Long Term Goal(s): Improvement in symptoms so as ready for discharge Improvement in symptoms so as ready for discharge   Short Term Goals: Ability to verbalize feelings will improve Ability to demonstrate self-control will improve Ability to identify and develop effective coping behaviors will improve  Medication Management: Evaluate patient's response, side effects, and tolerance of medication regimen.  Therapeutic Interventions: 1 to 1 sessions, Unit Group sessions  and Medication administration.  Evaluation of Outcomes: Progressing  Physician Treatment Plan for Secondary Diagnosis: Principal Problem:   Depression Active Problems:   Marijuana dependence ([place])  Long Term Goal(s): Improvement in symptoms so as ready for discharge Improvement in symptoms so as ready for discharge   Short Term Goals: Ability to verbalize feelings will improve Ability to demonstrate self-control will improve Ability to identify and develop effective coping behaviors will improve     Medication  Management: Evaluate patient's response, side effects, and tolerance of medication regimen.  Therapeutic Interventions: 1 to 1 sessions, Unit Group sessions and Medication administration.  Evaluation of Outcomes: Progressing   RN Treatment Plan for Primary Diagnosis: Depression Long Term Goal(s): Knowledge of disease and therapeutic regimen to maintain health will improve  Short Term Goals: Ability to remain free from injury will improve, Ability to disclose and discuss suicidal ideas and Ability to identify and develop effective coping behaviors will improve  Medication Management: RN will administer medications as ordered by provider, will assess and evaluate patient's response and provide education to patient for prescribed medication. RN will report any adverse and/or side effects to prescribing provider.  Therapeutic Interventions: 1 on 1 counseling sessions, Psychoeducation, Medication administration, Evaluate responses to treatment, Monitor vital signs and CBGs as ordered, Perform/monitor CIWA, COWS, AIMS and Fall Risk screenings as ordered, Perform wound care treatments as ordered.  Evaluation of Outcomes: Progressing   LCSW Treatment Plan for Primary Diagnosis: Depression Long Term Goal(s): Safe transition to appropriate next level of care at discharge, Engage patient in therapeutic group addressing interpersonal concerns.  Short Term Goals: Engage patient in aftercare planning with referrals and resources, Facilitate acceptance of mental health diagnosis and concerns, Facilitate patient progression through stages of change regarding substance use diagnoses and concerns and Identify triggers associated with mental health/substance abuse issues  Therapeutic Interventions: Assess for all discharge needs, 1 to 1 time with Social worker, Explore available resources and support systems, Assess for adequacy in community support network, Educate family and significant other(s) on suicide  prevention, Complete Psychosocial Assessment, Interpersonal group therapy.  Evaluation of Outcomes: Progressing   Progress in Treatment: Attending groups: No. New to unit. Continuing to assess.  Participating in groups: No. Taking medication as prescribed: Yes. Toleration medication: Yes. Family/Significant other contact made: No, will contact:  family member if pt consents Patient understands diagnosis: Yes. Discussing patient identified problems/goals with staff: Yes. Medical problems stabilized or resolved: Yes. Denies suicidal/homicidal ideation: Yes. Issues/concerns per patient self-inventory: No. Other: n/a  New problem(s) identified: No, Describe:  n/a  New Short Term/Long Term Goal(s): medication stabilization/connect pt with provider in the community for ongoing mental health support services.   Discharge Plan or Barriers: CSW assessing for appropriate referrals. Pt does not have any outpatient mental health providers at this time.   Reason for Continuation of Hospitalization: Depression Medication stabilization Withdrawal symptoms  Estimated Length of Stay: 2-4 days   Attendees: Patient: 05/20/2016 2:43 PM  Physician:  Dr. Sharolyn Douglas MD 05/20/2016 2:43 PM  Nursing: Chestine Spore RN 05/20/2016 2:43 PM  RN Care Manager: Lars Pinks CM 05/20/2016 2:43 PM  Social Worker: Maxie Better, LCSW 05/20/2016 2:43 PM  Recreational Therapist:  05/20/2016 2:43 PM  Other:  05/20/2016 2:43 PM  Other:  05/20/2016 2:43 PM  Other: 05/20/2016 2:43 PM    Scribe for Treatment Team: [place], LCSW 05/20/2016 2:43 PM
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
## 2016-05-20 NOTE — Progress Notes (Addendum)
Recreation Therapy Notes  Date: 05/20/16 Time: 0930 Location: [address] Dayroom  Group Topic: Stress Management  Goal Area(s) Addresses:  Patient will verbalize importance of using healthy stress management. Patient will identify positive emotions associated with healthy stress management.   Intervention: Stress Management  Activity: Progressive Muscle Relaxation.  LRT introduced the stress management technique of progressive muscle relaxation.  LRT read a script to allow patients to participate in activity.  Patients were to follow along as LRT read script.  Education:  Stress Management, Discharge Planning  Education Outcome: Acknowledges education/In group clarification offered/Needs additional education  Clinical Observations/Feedback: Pt did not attend group.    Victorino Sparrow, LRT/CTRS         Victorino Sparrow A 05/20/2016 12:17 PM

## 2016-05-20 NOTE — Progress Notes (Signed)
Patient ID: Tammy Garrison, female   DOB: [birth year], 25 y.o.   MRN: [id number]  DAR: Pt. Denies SI/HI and A/V Hallucinations. She reports sleep is poor, appetite is poor, energy level is low, and concentration is good. She rates depression 2/10, hopelessness 0/10, and anxiety 6/10. Patient reports a headache and received PRN medication for this. Patient reported later that Fioricet was helpful and a patient education packet was provided to her. Support and encouragement provided to the patient. Medications administered to patient per physician's orders. Patient is minimal and isolative but cooperative. When Probation officer spoke with patient 1:1 and she stated she has social anxiety and does not want to go to the cafeteria or in the dayroom because she feels like people are staring at her. Patient encouraged to take PRN Vistaril before to alleviate some anxiety. Patient was open to this suggestion. Q15 minute checks are maintained for safety.

## 2016-05-20 NOTE — [facility] Suicide Risk Assessment (Signed)
[facility] INPATIENT:  Family/Significant Other Suicide Prevention Education  Suicide Prevention Education:  Contact Attempts: Romilda Joy (pt's friend) [phone] has been identified by the patient as the family member/significant other with whom the patient will be residing, and identified as the person(s) who will aid the patient in the event of a mental health crisis.  With written consent from the patient, two attempts were made to provide suicide prevention education, prior to and/or following the patient's discharge.  We were unsuccessful in providing suicide prevention education.  A suicide education pamphlet was given to the patient to share with family/significant other.  Date and time of first attempt: 10:45AM (Voicemail left requesting call back).  Date and time of second attempt: 3:00PM   Kimber Relic Smart LCSW 05/20/2016, 3:06 PM

## 2016-05-20 NOTE — [facility] Group Notes (Signed)
[facility] LCSW Group Therapy  05/20/2016 3:14 PM  Type of Therapy:  Group Therapy  Participation Level:  Minimal  Participation Quality:  Attentive  Affect:  Appropriate  Cognitive:  Alert and Oriented  Insight:  Improving  Engagement in Therapy:  Limited  Modes of Intervention:  Confrontation, Discussion, Education, Exploration, Problem-solving, Rapport Building, Socialization and Support  Summary of Progress/Problems: Today's Topic: Overcoming Obstacles. Patients identified one short term goal and potential obstacles in reaching this goal. Patients processed barriers involved in overcoming these obstacles. Patients identified steps necessary for overcoming these obstacles and explored motivation (internal and external) for facing these difficulties head on. Tammy Garrison was attentive during group with minimal active participation.  She stated that she is interested in getting a counselor but not in taking mental health medication. She did not identify any obstacles and reports that she is anxious for discharge tomorrow and excited to go home. She continues to show some insight with limited progress in the group setting.   Tammy Garrison 05/20/2016, 3:14 PM

## 2016-05-20 NOTE — [facility] Suicide Risk Assessment (Signed)
[facility] Admission Suicide Risk Assessment   Nursing information obtained from:  Patient Demographic factors:  NA Current Mental Status:  NA Loss Factors:  Financial problems / change in socioeconomic status Historical Factors:  Prior suicide attempts, Impulsivity Risk Reduction Factors:  Living with another person, especially a relative  Total Time spent with patient: 30 minutes Principal Problem: Depression Diagnosis:   Patient Active Problem List   Diagnosis Date Noted  . Depression [F32.9] 05/20/2016  . Marijuana dependence ([place]) [F12.20] 05/19/2016   Subjective Data: Patient denies any current suicidal or homicidal ideation, plan or intent.  Continued Clinical Symptoms:  Alcohol Use Disorder Identification Test Final Score (AUDIT): 10 The "Alcohol Use Disorders Identification Test", Guidelines for Use in Primary Care, Second Edition.  World Pharmacologist [facility]). Score between 0-7:  no or low risk or alcohol related problems. Score between 8-15:  moderate risk of alcohol related problems. Score between 16-19:  high risk of alcohol related problems. Score 20 or above:  warrants further diagnostic evaluation for alcohol dependence and treatment.   CLINICAL FACTORS:   Dysthymia   Musculoskeletal: Strength & Muscle Tone: within normal limits Gait & Station: normal Patient leans: N/A  Psychiatric Specialty Exam: Physical Exam  ROS  Blood pressure (!) 148/73, pulse 77, temperature 98.6 F (37 C), temperature source Oral, resp. rate 18, height 5\' 11"  (1.803 m), weight 122.9 kg (271 lb), last menstrual period 05/05/2016, SpO2 100 %.Body mass index is 37.8 kg/m.   General Appearance: Casual  Eye Contact:  Good  Speech:  Clear and Coherent  Volume:  Normal  Mood:  Euthymic  Affect:  Congruent  Thought Process:  Coherent  Orientation:  Full (Time, Place, and Person)  Thought Content:  Negative  Suicidal Thoughts:  No  Homicidal Thoughts:  No  Memory:  Negative   Judgement:  Fair  Insight:  Fair  Psychomotor Activity:  Normal  Concentration:  Concentration: Good  Recall:  Good  Fund of Knowledge:  Good  Language:  NA  Akathisia:  No  Handed:  Right  AIMS (if indicated):     Assets:  Financial Resources/Insurance Housing Resilience Social Support  ADL's:  Intact  Cognition:  WNL  Sleep:  Number of Hours: 4.5       COGNITIVE FEATURES THAT CONTRIBUTE TO RISK:  None    SUICIDE RISK:   Mild:  Suicidal ideation of limited frequency, intensity, duration, and specificity.  There are no identifiable plans, no associated intent, mild dysphoria and related symptoms, good self-control (both objective and subjective assessment), few other risk factors, and identifiable protective factors, including available and accessible social support.   PLAN OF CARE: see PAA  I certify that inpatient services furnished can reasonably be expected to improve the patient's condition.  Linard Millers, MD 05/20/2016, 2:11 PM
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
## 2016-05-20 NOTE — Progress Notes (Signed)
Pt did not attend NA meeting this evening.  
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
## 2016-05-21 MED ORDER — HYDROXYZINE HCL 25 MG PO TABS: 25.0000 mg | ORAL_TABLET | Freq: Three times a day (TID) | ORAL | 0 refills | Status: DC | PRN

## 2016-05-21 MED ORDER — NICOTINE 21 MG/24HR TD PT24: 21.0000 mg | MEDICATED_PATCH | Freq: Every day | TRANSDERMAL | 0 refills | Status: DC

## 2016-05-21 MED ORDER — THIAMINE HCL 100 MG PO TABS: 100.0000 mg | ORAL_TABLET | Freq: Every day | ORAL | 0 refills | Status: DC

## 2016-05-21 MED ORDER — ZOLPIDEM TARTRATE 5 MG PO TABS
5.0000 mg | ORAL_TABLET | Freq: Every evening | ORAL | 0 refills | Status: DC | PRN
Start: 2016-05-21 — End: 2021-12-16

## 2016-05-21 NOTE — Tx Team (Signed)
Interdisciplinary Treatment and Diagnostic Plan Update  05/21/2016 Time of Session: 9:30AM Tammy Garrison MRN: [id number]  Principal Diagnosis: Depression  Secondary Diagnoses: Principal Problem:   Depression Active Problems:   Marijuana dependence ([place])   Current Medications:  Current Facility-Administered Medications  Medication Dose Route Frequency Provider Last Rate Last Dose  . acetaminophen (TYLENOL) tablet 650 mg  650 mg Oral Q6H PRN Niel Hummer, NP   650 mg at 05/20/16 0043  . alum & mag hydroxide-simeth (MAALOX/MYLANTA) 200-200-20 MG/5ML suspension 30 mL  30 mL Oral Q4H PRN Niel Hummer, NP      . butalbital-acetaminophen-caffeine (FIORICET, ESGIC) [phone] MG per tablet 1 tablet  1 tablet Oral Q6H PRN Kerrie Buffalo, NP   1 tablet at 05/21/16 7858  . hydrOXYzine (ATARAX/VISTARIL) tablet 25 mg  25 mg Oral TID PRN Linard Millers, MD   25 mg at 05/20/16 2325  . ibuprofen (ADVIL,MOTRIN) tablet 600 mg  600 mg Oral Q6H PRN Laverle Hobby, PA-C   600 mg at 05/20/16 8502  . magnesium hydroxide (MILK OF MAGNESIA) suspension 30 mL  30 mL Oral Daily PRN Niel Hummer, NP      . nicotine (NICODERM CQ - dosed in mg/24 hours) patch 21 mg  21 mg Transdermal Daily Laverle Hobby, PA-C      . ondansetron [facility]) tablet 4 mg  4 mg Oral Q8H PRN Niel Hummer, NP      . thiamine (VITAMIN B-1) tablet 100 mg  100 mg Oral Daily Niel Hummer, NP   100 mg at 05/21/16 0831  . zolpidem (AMBIEN) tablet 5 mg  5 mg Oral QHS PRN Niel Hummer, NP   5 mg at 05/20/16 2140   PTA Medications: Prescriptions Prior to Admission  Medication Sig Dispense Refill Last Dose  . doxylamine, Sleep, (SLEEP AID) 25 MG tablet Take 50 mg by mouth at bedtime.   05/17/2016 at Unknown time  . ibuprofen (ADVIL,MOTRIN) 200 MG tablet Take 400 mg by mouth every 6 (six) hours as needed.   05/18/2016 at Unknown time  . diazepam (VALIUM) 5 MG tablet Take 1 tablet (5 mg total) by mouth 2 (two) times daily.  (Patient not taking: Reported on 05/20/2016) 10 tablet 0 Not Taking at Unknown time  . HYDROcodone-ibuprofen (VICOPROFEN) 7.5-200 MG per tablet Take 1 tablet by mouth every 6 (six) hours as needed for moderate pain. (Patient not taking: Reported on 05/20/2016) 13 tablet 0 Not Taking at Unknown time  . predniSONE (DELTASONE) 20 MG tablet 3 tabs po daily x 3 days, then 2 tabs x 3 days, then 1.5 tabs x 3 days, then 1 tab x 3 days, then 0.5 tabs x 3 days (Patient not taking: Reported on 05/20/2016) 27 tablet 0 Not Taking at Unknown time    Patient Stressors: Financial difficulties Health problems  Patient Strengths: Capable of independent living Curator fund of knowledge Motivation for treatment/growth  Treatment Modalities: Medication Management, Group therapy, Case management,  1 to 1 session with clinician, Psychoeducation, Recreational therapy.   Physician Treatment Plan for Primary Diagnosis: Depression Long Term Goal(s): Improvement in symptoms so as ready for discharge Improvement in symptoms so as ready for discharge   Short Term Goals: Ability to verbalize feelings will improve Ability to demonstrate self-control will improve Ability to identify and develop effective coping behaviors will improve  Medication Management: Evaluate patient's response, side effects, and tolerance of medication regimen.  Therapeutic Interventions: 1 to 1 sessions,  Unit Group sessions and Medication administration.  Evaluation of Outcomes: Met  Physician Treatment Plan for Secondary Diagnosis: Principal Problem:   Depression Active Problems:   Marijuana dependence ([place])  Long Term Goal(s): Improvement in symptoms so as ready for discharge Improvement in symptoms so as ready for discharge   Short Term Goals: Ability to verbalize feelings will improve Ability to demonstrate self-control will improve Ability to identify and develop effective coping behaviors will improve      Medication Management: Evaluate patient's response, side effects, and tolerance of medication regimen.  Therapeutic Interventions: 1 to 1 sessions, Unit Group sessions and Medication administration.  Evaluation of Outcomes: Met   RN Treatment Plan for Primary Diagnosis: Depression Long Term Goal(s): Knowledge of disease and therapeutic regimen to maintain health will improve  Short Term Goals: Ability to remain free from injury will improve, Ability to disclose and discuss suicidal ideas and Ability to identify and develop effective coping behaviors will improve  Medication Management: RN will administer medications as ordered by provider, will assess and evaluate patient's response and provide education to patient for prescribed medication. RN will report any adverse and/or side effects to prescribing provider.  Therapeutic Interventions: 1 on 1 counseling sessions, Psychoeducation, Medication administration, Evaluate responses to treatment, Monitor vital signs and CBGs as ordered, Perform/monitor CIWA, COWS, AIMS and Fall Risk screenings as ordered, Perform wound care treatments as ordered.  Evaluation of Outcomes: Met   LCSW Treatment Plan for Primary Diagnosis: Depression Long Term Goal(s): Safe transition to appropriate next level of care at discharge, Engage patient in therapeutic group addressing interpersonal concerns.  Short Term Goals: Engage patient in aftercare planning with referrals and resources, Facilitate acceptance of mental health diagnosis and concerns, Facilitate patient progression through stages of change regarding substance use diagnoses and concerns and Identify triggers associated with mental health/substance abuse issues  Therapeutic Interventions: Assess for all discharge needs, 1 to 1 time with Social worker, Explore available resources and support systems, Assess for adequacy in community support network, Educate family and significant other(s) on suicide  prevention, Complete Psychosocial Assessment, Interpersonal group therapy.  Evaluation of Outcomes: Met   Progress in Treatment: Attending groups: Yes Participating in groups: Minimally, when she attends  Taking medication as prescribed: Yes. Toleration medication: Yes. Family/Significant other contact made: Contact attempts made with pt's friend. SPE completed with pt.  Patient understands diagnosis: Yes. Discussing patient identified problems/goals with staff: Yes. Medical problems stabilized or resolved: Yes. Denies suicidal/homicidal ideation: Yes. Issues/concerns per patient self-inventory: No. Other: n/a  New problem(s) identified: No, Describe:  n/a  New Short Term/Long Term Goal(s): medication stabilization/connect pt with provider in the community for ongoing mental health support services.   Discharge Plan or Barriers: Pt declined medication management referral and is not currently on mental health medications. Pt referred to [facility]. CSW sent referral on Wed and has called 2x today in attempt to get pt scheduled. Pt aware that CSW will call her with appt time/date and was asked to call the office at discharge as well.   Reason for Continuation of Hospitalization:  none  Estimated Length of Stay: d/c today   Attendees: Patient: 05/21/2016 10:14 AM  Physician:  Dr. Sharolyn Douglas MD 05/21/2016 10:14 AM  Nursing: Brita Romp RN 05/21/2016 10:14 AM  RN Care Manager: 05/21/2016 10:14 AM  Social Worker: Press photographer, LCSW 05/21/2016 10:14 AM  Recreational Therapist:  05/21/2016 10:14 AM  Other:  05/21/2016 10:14 AM  Other:  05/21/2016 10:14 AM  Other: 05/21/2016  10:14 AM    Scribe for Treatment Team: [employer], LCSW 05/21/2016 10:14 AM

## 2016-05-21 NOTE — Progress Notes (Signed)
Tammy Garrison is prepared for DC today as she completes her daily assessment. All  DC paperwork is reviewed with her by Probation officer and she responds with "yes" when asked by Probation officer if she  understands. She is given cc of documents. She denies active SI and she shakes her  "yes" when writer asks if she is willing and able to comp,ly with discharge plan. She is given return to work letter from Education officer, museum and dc follow up will be completed when she is contacted by social work with appt date and time. All belongings are returned to her and she signe release of returned property. DC completed.

## 2016-05-21 NOTE — Discharge Summary (Signed)
Physician Discharge Summary Note  Patient:  Tammy Garrison is an 25 y.o., female MRN:  [id number] DOB:  1990/12/03 Patient phone:  [phone] (home)  Patient address:   [place] [address],  Total Time spent with patient: 45 minutes  Date of Admission:  05/19/2016 Date of Discharge: 05/21/2016  Reason for Admission:   Patient reports she was feeling stressed out over the weekend and ended up taking an overdose. She states her mood was "lowest since I was 18." That Sunday. 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 She also learned that her sister had started doing drugs and she reports her sister and her relationship with her stress sister is also a chronic stressor. That day there were also problems with her boyfriend with whom she lives. Another chronic stressor is that they have been trying to have a child for the past year or year and a half and had been unable to conceive.  Patient does admit that she has chronic feelings of being "antsy" and tends to over ruminate. She also has trouble with sleep and appetite. She treats her symptoms of stress and mood with marijuana. She reports that marijuana is very helpful but expensive. She typically smokes about 4 bowls and 2 blunts a day.  The patient reports that when she was 18 she had depression and cut herself and took an overdose but denies having any kind of behavior of that sort or any need for mental health follow-up or any prior hospitalizations since that time.   Principal Problem: Depression Discharge Diagnoses: Patient Active Problem List   Diagnosis Date Noted  . Depression [F32.9] 05/20/2016  . Marijuana dependence [facility]) [F12.20] 05/19/2016    Past Psychiatric History: See H&P  Past Medical History:  Past Medical History:  Diagnosis Date  . Bipolar 1 disorder ([place])   . ETOH abuse   . H/O  self-harm   . H/O suicide attempt    x 5 - last 05/2016 - overdose Ibuprofen  . Marijuana abuse    History reviewed. No pertinent surgical history. Family History:  Family History  Problem Relation Age of Onset  . Hypertension Other    Family Psychiatric  History: See H&P Social History:  History  Alcohol Use  . Yes    Comment: 1 bottle every 1 days     History  Drug Use  . Types: Marijuana    Comment: 2 blunts/day    Social History   Social History  . Marital status: Single    Spouse name: N/A  . Number of children: N/A  . Years of education: N/A   Social History Main Topics  . Smoking status: Current Every Day Smoker    Packs/day: 0.25    Types: Cigarettes  . Smokeless tobacco: Never Used  . Alcohol use Yes     Comment: 1 bottle every 1 days  . Drug use:     Types: Marijuana     Comment: 2 blunts/day  . Sexual activity: Yes    Birth control/ protection: None   Other Topics Concern  . None   Social History Narrative  . None    Hospital Course:   Tammy Garrison was admitted for Depression , with psychosis and crisis management.  Pt was treated discharged with the medications listed below under Medication List.  Medical problems were identified and treated as  needed.  Home medications were restarted as appropriate.  Improvement was monitored by observation and Tammy Garrison 's daily report of symptom reduction.  Emotional and mental status was monitored by daily self-inventory reports completed by Tammy Garrison and clinical staff.         Tammy Garrison was evaluated by the treatment team for stability and plans for continued recovery upon discharge. Tammy Garrison 's motivation was an integral factor for scheduling further treatment. Employment, transportation, bed availability, health status, family support, and any pending legal issues were also considered during hospital stay. Pt was offered further  treatment options upon discharge including but not limited to Residential, Intensive Outpatient, and Outpatient treatment.  Tammy Garrison will follow up with the services as listed below under Follow Up Information.     Upon completion of this admission the patient was both mentally and medically stable for discharge denying suicidal/homicidal ideation, auditory/visual/tactile hallucinations, delusional thoughts and paranoia.    Tammy Garrison responded well to treatment with vistaril, nicotine, thiamine, ambien,without adverse effects. Pt demonstrated improvement without reported or observed adverse effects to the point of stability appropriate for outpatient management. Pertinent labs include: UDS + THC.  Reviewed CBC, CMP, BAL, and UDS; all unremarkable aside from noted exceptions.   Physical Findings: AIMS: Facial and Oral Movements Muscles of Facial Expression: None, normal Lips and Perioral Area: None, normal Jaw: None, normal Tongue: None, normal,Extremity Movements Upper (arms, wrists, hands, fingers): None, normal Lower (legs, knees, ankles, toes): None, normal, Trunk Movements Neck, shoulders, hips: None, normal, Overall Severity Severity of abnormal movements (highest score from questions above): None, normal Incapacitation due to abnormal movements: None, normal Patient's awareness of abnormal movements (rate only patient's report): No Awareness, Dental Status Current problems with teeth and/or dentures?: No Does patient usually wear dentures?: No  CIWA:  CIWA-Ar Total: 0 COWS:     Musculoskeletal: Strength & Muscle Tone: within normal limits Gait & Station: normal Patient leans: N/A  Psychiatric Specialty Exam: Physical Exam  Review of Systems  Psychiatric/Behavioral: Positive for depression and substance abuse. Negative for suicidal ideas. The patient is nervous/anxious and has insomnia.   All other systems reviewed and are negative.   Blood  pressure (!) 136/95, pulse 99, temperature 98.6 F (37 C), resp. rate 17, height 5\' 11"  (1.803 m), weight 122.9 kg (271 lb), last menstrual period 05/05/2016, SpO2 100 %.Body mass index is 37.8 kg/m.  SEE MD PSE WITHIN SRA    Have you used any form of tobacco in the last 30 days? (Cigarettes, Smokeless Tobacco, Cigars, and/or Pipes): Yes  Has this patient used any form of tobacco in the last 30 days? (Cigarettes, Smokeless Tobacco, Cigars, and/or Pipes) Yes, and a prescription for tobacco cessation nicotine patch was given.   Blood Alcohol level:  Lab Results  Component Value Date   ETH <5 0000000    Metabolic Disorder Labs:  No results found for: HGBA1C, MPG No results found for: PROLACTIN No results found for: CHOL, TRIG, HDL, CHOLHDL, VLDL, LDLCALC  See Psychiatric Specialty Exam and Suicide Risk Assessment completed by Attending Physician prior to discharge.  Discharge destination:  Home  Is patient on multiple antipsychotic therapies at discharge:  No   Has Patient had three or more failed trials of antipsychotic monotherapy by history:  No  Recommended Plan for Multiple Antipsychotic Therapies: NA     Medication List    STOP taking these medications   diazepam 5 MG tablet Commonly known  as:  VALIUM   HYDROcodone-ibuprofen 7.5-200 MG tablet Commonly known as:  VICOPROFEN   ibuprofen 200 MG tablet Commonly known as:  ADVIL,MOTRIN   predniSONE 20 MG tablet Commonly known as:  DELTASONE   SLEEP AID 25 MG tablet Generic drug:  doxylamine (Sleep)     TAKE these medications     Indication  hydrOXYzine 25 MG tablet Commonly known as:  ATARAX/VISTARIL Take 1 tablet (25 mg total) by mouth 3 (three) times daily as needed for anxiety, nausea or vomiting.  Indication:  Anxiety Neurosis   nicotine 21 mg/24hr patch Commonly known as:  NICODERM CQ - dosed in mg/24 hours Place 1 patch (21 mg total) onto the skin daily. Start taking on:  05/22/2016  Indication:   Nicotine Addiction   thiamine 100 MG tablet Take 1 tablet (100 mg total) by mouth daily. Start taking on:  05/22/2016  Indication:  Deficiency in Thiamine or Vitamin B1   zolpidem 5 MG tablet Commonly known as:  AMBIEN Take 1 tablet (5 mg total) by mouth at bedtime as needed for sleep.  Indication:  [place] .   Why:  Message left for [facility] requesting appt for counseling. Referral faxed on 05/20/16. If appt not received by discharge, please call the office at [phone] to follow-up. Thank you.  Contact information: [place] [phone]       Referral for psychiatry declined by patient/not taking mental health medications. .           Follow-up recommendations:  Activity:  As tolerated Diet:  Heart healthy with low sodium  Comments:   Take all medications as prescribed. Keep all follow-up appointments as scheduled.  Do not consume alcohol or use illegal drugs while on prescription medications. Report any adverse effects from your medications to your primary care provider promptly.  In the event of recurrent symptoms or worsening symptoms, call 911, a crisis hotline, or go to the nearest emergency department for evaluation.   Signed: Benjamine Mola, FNP 05/21/2016, 10:14 AM

## 2016-05-21 NOTE — [facility] Group Notes (Signed)
Daily Goals Group  Date:  05/21/2016  Time:  0900  Type of Therapy:  Nurse Education  /  Goals Group: The group is focused on teaching patients how to set daily attainable goals that will help them maintain their recovery.  Participation Level:  Patient did not attend  Participation Quality: N/A  Affect:  N/A  Cognitive:  N/A  Insight: N/A   Engagement in Group:  N/A  Modes of Intervention: N/A   Summary of Progress/Problems: N/A  Lauralyn Primes 05/21/2016, 10:25 AM

## 2016-05-21 NOTE — Progress Notes (Signed)
Patient ID: Tammy Garrison, female   DOB: 1990/09/15, 25 y.o.   MRN: [id number]  DAR: Pt. Denies SI/HI and A/V Hallucinations to this writer during assessment. She reports that she has not been eating well but states she is ready for a good meal after discharge. Patient continues to report a headache. BP is elevated and MD Sharolyn Douglas made aware during treatment team. Patient encouraged to follow up with both complaints with a provider after discharge. Support and encouragement provided to the patient. Patient encouraged to fill out daily inventory sheet but did not. Scheduled Thiamine administered to patient. Patient came to writer shortly after and had tears in her eyes. She reported she was anxious about discharge as she will have to face the stress that she has not while at [facility]. Writer offered support and Vistaril which provided patient with relief.  Patient is seen a little more in the milieu today. Q15 minute checks are maintained for safety.

## 2016-05-21 NOTE — Progress Notes (Signed)
Follow-up counseling appt for Nov. 6 at 11:00AM with Wonda Cheng at [place].   Maxie Better, MSW, LCSW Clinical Social Worker 05/21/2016 1:10 PM

## 2016-05-21 NOTE — Progress Notes (Signed)
  [facility] Adult Case Management Discharge Plan :  Will you be returning to the same living situation after discharge:  Yes,  home At discharge, do you have transportation home?: Yes,  mother Do you have the ability to pay for your medications: Yes,  [employer] insurance  Release of information consent forms completed and submitted to medical records by CSW.  Patient to Follow up at: [place] .   Why:  Message left for [facility] requesting appt for counseling. Referral faxed on 05/20/16. If appt not received by discharge, please call the office at [phone] to follow-up. Thank you.  Contact information: [place] [phone]       Referral for psychiatry declined by patient/not taking mental health medications. .           Next level of care provider has access to [facility] and Suicide Prevention discussed: Yes,  SPE completed with pt; contact attempts made with pt's friend. voicemails left. SPI pamphlet and Mobile Crisis information also provided to pt and she was encouraged to share information with support network, ask questions, and talk about any concerns relating to SPE.  Have you used any form of tobacco in the last 30 days? (Cigarettes, Smokeless Tobacco, Cigars, and/or Pipes): Yes  Has patient been referred to the Quitline?: Patient refused referral  Patient has been referred for addiction treatment: Yes  Ulani Degrasse N Smart LCSW 05/21/2016, 10:13 AM

## 2016-10-17 ENCOUNTER — Encounter (HOSPITAL_COMMUNITY): Payer: Self-pay | Admitting: Emergency Medicine

## 2016-10-17 ENCOUNTER — Emergency Department (HOSPITAL_COMMUNITY)
Admission: EM | Admit: 2016-10-17 | Discharge: 2016-10-17 | Disposition: A | Discharge status: Home / Self Care | Payer: Managed Care, Other (non HMO) | Attending: Emergency Medicine | Admitting: Emergency Medicine

## 2016-10-17 DIAGNOSIS — N920 Excessive and frequent menstruation with regular cycle: Secondary | ICD-10-CM | POA: Insufficient documentation

## 2016-10-17 DIAGNOSIS — N922 Excessive menstruation at puberty: Secondary | ICD-10-CM

## 2016-10-17 DIAGNOSIS — F17210 Nicotine dependence, cigarettes, uncomplicated: Secondary | ICD-10-CM | POA: Insufficient documentation

## 2016-10-17 LAB — URINALYSIS, ROUTINE W REFLEX MICROSCOPIC
Bilirubin Urine: NEGATIVE
Glucose, UA: NEGATIVE mg/dL
Hgb urine dipstick: NEGATIVE
KETONES UR: NEGATIVE mg/dL
LEUKOCYTES UA: NEGATIVE
NITRITE: NEGATIVE
PH: 7 (ref 5.0–8.0)
PROTEIN: NEGATIVE mg/dL
Specific Gravity, Urine: 1.018 (ref 1.005–1.030)

## 2016-10-17 LAB — WET PREP, GENITAL
Clue Cells Wet Prep HPF POC: NONE SEEN
Sperm: NONE SEEN
Trich, Wet Prep: NONE SEEN
YEAST WET PREP: NONE SEEN

## 2016-10-17 MED ORDER — IBUPROFEN 600 MG PO TABS: 600.0000 mg | ORAL_TABLET | Freq: Four times a day (QID) | ORAL | 0 refills | Status: DC | PRN

## 2016-10-17 MED ORDER — KETOROLAC TROMETHAMINE 30 MG/ML IJ SOLN
30.0000 mg | Freq: Once | INTRAMUSCULAR | Status: AC
  Administered 2016-10-17: 30 mg via INTRAMUSCULAR
  Filled 2016-10-17: qty 1

## 2016-10-17 NOTE — ED Provider Notes (Signed)
[place] DEPT Provider Note   CSN: [id number] Arrival date & time: 10/17/16  1251     History   Chief Complaint Chief Complaint  Patient presents with  . Vaginal Bleeding    HPI Tammy Garrison is a 26 y.o. female.  HPI  26 y.o. female, presents to the Emergency Department today complaining of vaginal bleeding since 10-04-16. Notes cycles lasted for 7 days, then resolved, then returned again. Reports passing large clots on Wednesday. No N/V. Notes mild abdominal cramping bilaterally, which is similar to menstrual cramps in the past. No vaginal discharge. No dysuria. Pt is not sexually active. Denies pregnancy. Does not use contraception or any type of OCP. No CP/SOB. No syncope or lightheadedness. No other symptoms noted.   Past Medical History:  Diagnosis Date  . Bipolar 1 disorder ([place])   . ETOH abuse   . H/O self-harm   . H/O suicide attempt    x 5 - last 05/2016 - overdose Ibuprofen  . Marijuana abuse     Patient Active Problem List   Diagnosis Date Noted  . Depression 05/20/2016  . Marijuana dependence ([place]) 05/19/2016    History reviewed. No pertinent surgical history.  OB History    No data available       Home Medications    Prior to Admission medications   Medication Sig Start Date End Date Taking? Authorizing Provider  hydrOXYzine (ATARAX/VISTARIL) 25 MG tablet Take 1 tablet (25 mg total) by mouth 3 (three) times daily as needed for anxiety, nausea or vomiting. 05/21/16   Benjamine Mola, FNP  nicotine (NICODERM CQ - DOSED IN MG/24 HOURS) 21 mg/24hr patch Place 1 patch (21 mg total) onto the skin daily. 05/22/16   Benjamine Mola, FNP  thiamine 100 MG tablet Take 1 tablet (100 mg total) by mouth daily. 05/22/16   Benjamine Mola, FNP  zolpidem (AMBIEN) 5 MG tablet Take 1 tablet (5 mg total) by mouth at bedtime as needed for sleep. 05/21/16   Benjamine Mola, FNP    Family History Family History  Problem Relation Age of Onset  . Hypertension  Other     Social History Social History  Substance Use Topics  . Smoking status: Current Every Day Smoker    Packs/day: 0.25    Types: Cigarettes  . Smokeless tobacco: Never Used  . Alcohol use Yes     Comment: 1 bottle every 1 days     Allergies   Patient has no known allergies.   Review of Systems Review of Systems ROS reviewed and all are negative for acute change except as noted in the HPI  Physical Exam Updated Vital Signs BP 133/65 (BP Location: Right Arm)   Pulse 92   Temp 97.6 F (36.4 C)   Resp 17   SpO2 100%   Physical Exam  Constitutional: She is oriented to person, place, and time. Vital signs are normal. She appears well-developed and well-nourished.  HENT:  Head: Normocephalic and atraumatic.  Right Ear: Hearing normal.  Left Ear: Hearing normal.  Eyes: Conjunctivae and EOM are normal. Pupils are equal, round, and reactive to light.  Neck: Normal range of motion. Neck supple.  Cardiovascular: Normal rate, regular rhythm, normal heart sounds and intact distal pulses.   Pulmonary/Chest: Effort normal and breath sounds normal.  Abdominal: There is no tenderness. There is no rigidity, no rebound, no guarding, no CVA tenderness, no tenderness at McBurney's point and negative Murphy's sign.  Musculoskeletal: Normal range of  motion.  Neurological: She is alert and oriented to person, place, and time.  Skin: Skin is warm and dry.  Psychiatric: She has a normal mood and affect. Her speech is normal and behavior is normal. Thought content normal.  Nursing note and vitals reviewed.  Exam performed by Ozella Rocks,  exam chaperoned Date: 10/17/2016 Pelvic exam: normal external genitalia without evidence of trauma. VULVA: normal appearing vulva with no masses, tenderness or lesion. VAGINA: normal appearing vagina with normal color and discharge, no lesions. CERVIX: normal appearing cervix without lesions, cervical motion tenderness absent, cervical os closed  with out purulent discharge; vaginal discharge - clear, Wet prep and DNA probe for chlamydia and GC obtained.   ADNEXA: normal adnexa in size, nontender and no masses UTERUS: uterus is normal size, shape, consistency and nontender.    ED Treatments / Results  Labs (all labs ordered are listed, but only abnormal results are displayed) Labs Reviewed  WET PREP, GENITAL  URINALYSIS, ROUTINE W REFLEX MICROSCOPIC  GC/CHLAMYDIA PROBE AMP ([place]) NOT AT [facility]   EKG  EKG Interpretation None      Radiology No results found.  Procedures Procedures (including critical care time)  Medications Ordered in ED Medications  ketorolac (TORADOL) 30 MG/ML injection 30 mg (30 mg Intramuscular Given 10/17/16 1511)   Initial Impression / Assessment and Plan / ED Course  I have reviewed the triage vital signs and the nursing notes.  Pertinent labs & imaging results that were available during my care of the patient were reviewed by me and considered in my medical decision making (see chart for details).  Final Clinical Impressions(s) / ED Diagnoses  {I have reviewed and evaluated the relevant laboratory values.   {I have reviewed the relevant previous healthcare records.  {I obtained HPI from historian.   ED Course:  Assessment: Pt is a 27 y.o. female who presents with prolonged menses. No N/V. MIld abdominal cramping bilaterally. No fevers. On exam, pt in NAD. Nontoxic/nonseptic appearing. VSS. Afebrile. Lungs CTA. Heart RRR. Abdomen nontender soft. GU Exam unremarkable. No gross blood noted. Mild discharge. No Adnexal. No CMT. Wet prep pending. GC obtained. Pt not sexually active. Low indication for STI. Pt in same sex relationship and does not have sex with men. Pending UA. Likely DUB. Given NSAIDs and follow up to GYN. Plan is to DC home. At time of discharge, Patient is in no acute distress. Vital Signs are stable. Patient is able to ambulate. Patient able to tolerate PO.   Disposition/Plan:    Anticipate DC Home Additional Verbal discharge instructions given and discussed with patient.  Pt Instructed to f/u with GYN in the next week for evaluation and treatment of symptoms. Return precautions given Pt acknowledges and agrees with plan  Supervising Physician Daleen Bo, MD  Final diagnoses:  Excessive menstruation at puberty    New Prescriptions New Prescriptions   No medications on file      Shary Decamp, PA-C 10/17/16 [place], MD 10/17/16 1557

## 2016-10-17 NOTE — Discharge Instructions (Addendum)
Please read and follow all provided instructions. Your urine today did not show any signs of infection, neither did your wet prep. Follow-up with an OB-GYN for this-- can make an appt with the wellness clinic. Return here for new concerns.

## 2016-10-17 NOTE — ED Triage Notes (Signed)
Patient in with complaints of vaginal bleeding. Reports getting period on 3/4, it went off after 7 days, came back on and has been on since. Reports "passing large clots". Denies pregnancy.

## 2016-10-17 NOTE — ED Notes (Signed)
Pt verbalizes frequency with urination denies other GU symptoms.

## 2016-10-19 LAB — GC/CHLAMYDIA PROBE AMP (~~LOC~~) NOT AT ARMC
Chlamydia: NEGATIVE
NEISSERIA GONORRHEA: NEGATIVE

## 2017-01-14 ENCOUNTER — Emergency Department (HOSPITAL_COMMUNITY)
Admission: EM | Admit: 2017-01-14 | Discharge: 2017-01-15 | Disposition: A | Discharge status: Home / Self Care | Payer: Managed Care, Other (non HMO) | Attending: Emergency Medicine | Admitting: Emergency Medicine

## 2017-01-14 ENCOUNTER — Encounter (HOSPITAL_COMMUNITY): Payer: Self-pay | Admitting: Emergency Medicine

## 2017-01-14 DIAGNOSIS — R2242 Localized swelling, mass and lump, left lower limb: Secondary | ICD-10-CM | POA: Insufficient documentation

## 2017-01-14 DIAGNOSIS — F17210 Nicotine dependence, cigarettes, uncomplicated: Secondary | ICD-10-CM | POA: Insufficient documentation

## 2017-01-14 DIAGNOSIS — Z79899 Other long term (current) drug therapy: Secondary | ICD-10-CM | POA: Insufficient documentation

## 2017-01-14 DIAGNOSIS — M5432 Sciatica, left side: Secondary | ICD-10-CM

## 2017-01-14 MED ORDER — HYDROMORPHONE HCL 1 MG/ML IJ SOLN
1.0000 mg | Freq: Once | INTRAMUSCULAR | Status: AC
  Administered 2017-01-14: 1 mg via INTRAMUSCULAR
  Filled 2017-01-14: qty 1

## 2017-01-14 MED ORDER — KETOROLAC TROMETHAMINE 60 MG/2ML IM SOLN
60.0000 mg | Freq: Once | INTRAMUSCULAR | Status: AC
  Administered 2017-01-14: 60 mg via INTRAMUSCULAR
  Filled 2017-01-14: qty 2

## 2017-01-14 MED ORDER — DIAZEPAM 5 MG PO TABS
5.0000 mg | ORAL_TABLET | Freq: Once | ORAL | Status: AC
  Administered 2017-01-14: 5 mg via ORAL
  Filled 2017-01-14: qty 1

## 2017-01-14 MED ORDER — METHOCARBAMOL 750 MG PO TABS: 750.0000 mg | ORAL_TABLET | Freq: Four times a day (QID) | ORAL | 0 refills | Status: DC

## 2017-01-14 MED ORDER — OXYCODONE-ACETAMINOPHEN 5-325 MG PO TABS
2.0000 | ORAL_TABLET | Freq: Once | ORAL | Status: AC
  Administered 2017-01-14: 2 via ORAL
  Filled 2017-01-14: qty 2

## 2017-01-14 MED ORDER — PREDNISONE 10 MG (21) PO TBPK: ORAL_TABLET | Freq: Every day | ORAL | 0 refills | Status: DC

## 2017-01-14 MED ORDER — HYDROCODONE-ACETAMINOPHEN 5-325 MG PO TABS: 2.0000 | ORAL_TABLET | ORAL | 0 refills | Status: DC | PRN

## 2017-01-14 NOTE — ED Notes (Signed)
Patient states pain has improved but is still there to the degree that she cannot sleep because of it.

## 2017-01-14 NOTE — ED Triage Notes (Signed)
Patient c/o left leg pain from buttock to foot and swelling that started yesterday.  Patient denies any falls or injuries.

## 2017-01-14 NOTE — ED Provider Notes (Signed)
[facility] DEPT Provider Note   CSN: [id number] Arrival date & time: 01/14/17  1428     History   Chief Complaint Chief Complaint  Patient presents with  . Leg Swelling  . Leg Pain    HPI Tammy Garrison is a 26 y.o. female.  26 year old female presents with left buttock pain times several days which radiates to her thigh. Pain is persistent and sharp and worse with sitting. Denies any prior history of trauma. Denies any associated back pain. No history of trauma. Has been using OTCs without relief. Denies any chest pain. No weakness in her left foot.      Past Medical History:  Diagnosis Date  . Bipolar 1 disorder ([place])   . ETOH abuse   . H/O self-harm   . H/O suicide attempt    x 5 - last 05/2016 - overdose Ibuprofen  . Marijuana abuse     Patient Active Problem List   Diagnosis Date Noted  . Depression 05/20/2016  . Marijuana dependence ([place]) 05/19/2016    History reviewed. No pertinent surgical history.  OB History    No data available       Home Medications    Prior to Admission medications   Medication Sig Start Date End Date Taking? Authorizing Provider  doxylamine, Sleep, (UNISOM) 25 MG tablet Take 25 mg by mouth at bedtime as needed for sleep.    [provider]  hydrOXYzine (ATARAX/VISTARIL) 25 MG tablet Take 1 tablet (25 mg total) by mouth 3 (three) times daily as needed for anxiety, nausea or vomiting. Patient not taking: Reported on 10/17/2016 05/21/16   Withrow, Elyse Jarvis, FNP  ibuprofen (ADVIL,MOTRIN) 600 MG tablet Take 1 tablet (600 mg total) by mouth every 6 (six) hours as needed. 10/17/16   Shary Decamp, PA-C  naproxen sodium (ANAPROX) 220 MG tablet Take 440 mg by mouth 2 (two) times daily with a meal.    [provider]  nicotine (NICODERM CQ - DOSED IN MG/24 HOURS) 21 mg/24hr patch Place 1 patch (21 mg total) onto the skin daily. Patient not taking: Reported on 10/17/2016 05/22/16   Benjamine Mola, FNP  thiamine  100 MG tablet Take 1 tablet (100 mg total) by mouth daily. Patient not taking: Reported on 10/17/2016 05/22/16   Benjamine Mola, FNP  zolpidem (AMBIEN) 5 MG tablet Take 1 tablet (5 mg total) by mouth at bedtime as needed for sleep. Patient not taking: Reported on 10/17/2016 05/21/16   Benjamine Mola, FNP    Family History Family History  Problem Relation Age of Onset  . Hypertension Other     Social History Social History  Substance Use Topics  . Smoking status: Current Every Day Smoker    Packs/day: 0.25    Types: Cigarettes  . Smokeless tobacco: Never Used  . Alcohol use Yes     Comment: 1 bottle every 1 days     Allergies   Coconut flavor   Review of Systems Review of Systems  All other systems reviewed and are negative.    Physical Exam Updated Vital Signs BP (!) 146/93 (BP Location: Right Arm)   Pulse (!) 102   Temp 98.9 F (37.2 C)   Resp 20   SpO2 99%   Physical Exam  Constitutional: She is oriented to person, place, and time. She appears well-developed and well-nourished.  Non-toxic appearance. No distress.  HENT:  Head: Normocephalic and atraumatic.  Eyes: Conjunctivae, EOM and lids are normal. Pupils are equal,  round, and reactive to light.  Neck: Normal range of motion. Neck supple. No tracheal deviation present. No thyroid mass present.  Cardiovascular: Normal rate, regular rhythm and normal heart sounds.  Exam reveals no gallop.   No murmur heard. Pulmonary/Chest: Effort normal and breath sounds normal. No stridor. No respiratory distress. She has no decreased breath sounds. She has no wheezes. She has no rhonchi. She has no rales.  Abdominal: Soft. Normal appearance and bowel sounds are normal. She exhibits no distension. There is no tenderness. There is no rebound and no CVA tenderness.  Musculoskeletal: Normal range of motion. She exhibits no edema or tenderness.       Legs: Neurovascular status intact at left foot, thigh compartment soft.    Neurological: She is alert and oriented to person, place, and time. She has normal strength. No cranial nerve deficit or sensory deficit. GCS eye subscore is 4. GCS verbal subscore is 5. GCS motor subscore is 6.  Skin: Skin is warm and dry. No abrasion and no rash noted.  Psychiatric: She has a normal mood and affect. Her speech is normal and behavior is normal.  Nursing note and vitals reviewed.    ED Treatments / Results  Labs (all labs ordered are listed, but only abnormal results are displayed) Labs Reviewed - No data to display  EKG  EKG Interpretation None       Radiology No results found.  Procedures Procedures (including critical care time)  Medications Ordered in ED Medications - No data to display   Initial Impression / Assessment and Plan / ED Course  I have reviewed the triage vital signs and the nursing notes.  Pertinent labs & imaging results that were available during my care of the patient were reviewed by me and considered in my medical decision making (see chart for details).     Patient medicated for pain here and feels better. Suspect that she has sciatica will place on prednisone taper as well as good muscle axis and short course of opiate  Final Clinical Impressions(s) / ED Diagnoses   Final diagnoses:  None    New Prescriptions New Prescriptions   No medications on file     Lacretia Leigh, MD 01/14/17 2308

## 2017-05-26 ENCOUNTER — Emergency Department (HOSPITAL_COMMUNITY)
Admission: EM | Admit: 2017-05-26 | Discharge: 2017-05-26 | Disposition: A | Discharge status: Home / Self Care | Payer: Managed Care, Other (non HMO) | Attending: Emergency Medicine | Admitting: Emergency Medicine

## 2017-05-26 ENCOUNTER — Encounter (HOSPITAL_COMMUNITY): Payer: Self-pay | Admitting: *Deleted

## 2017-05-26 DIAGNOSIS — F17210 Nicotine dependence, cigarettes, uncomplicated: Secondary | ICD-10-CM | POA: Insufficient documentation

## 2017-05-26 DIAGNOSIS — Z79899 Other long term (current) drug therapy: Secondary | ICD-10-CM | POA: Insufficient documentation

## 2017-05-26 DIAGNOSIS — M25531 Pain in right wrist: Secondary | ICD-10-CM | POA: Insufficient documentation

## 2017-05-26 MED ORDER — IBUPROFEN 600 MG PO TABS: 600.0000 mg | ORAL_TABLET | Freq: Four times a day (QID) | ORAL | 0 refills | Status: DC | PRN

## 2017-05-26 MED ORDER — HYDROCODONE-ACETAMINOPHEN 5-325 MG PO TABS: 1.0000 | ORAL_TABLET | ORAL | 0 refills | Status: DC | PRN

## 2017-05-26 MED ORDER — HYDROCODONE-ACETAMINOPHEN 5-325 MG PO TABS
1.0000 | ORAL_TABLET | Freq: Once | ORAL | Status: AC
  Administered 2017-05-26: 1 via ORAL
  Filled 2017-05-26: qty 1

## 2017-05-26 MED ORDER — IBUPROFEN 400 MG PO TABS
800.0000 mg | ORAL_TABLET | Freq: Once | ORAL | Status: AC
  Administered 2017-05-26: 800 mg via ORAL
  Filled 2017-05-26: qty 2

## 2017-05-26 NOTE — ED Triage Notes (Signed)
The pt is c/o rt wrist pain  She woke up with the pain one hour ago.  No previous  history

## 2017-05-26 NOTE — ED Notes (Signed)
Informed PA of BP: 143/96.

## 2017-05-26 NOTE — ED Provider Notes (Signed)
[facility] EMERGENCY DEPARTMENT Provider Note   CSN: [id number] Arrival date & time: 05/26/17  0222     History   Chief Complaint Chief Complaint  Patient presents with  . Wrist Pain    HPI Tammy Garrison is a 26 y.o. female.  Patient presents with right wrist pain that started this morning and worsened over the course of the day. No known injury. She does a lot of heavy lifting at work but does not remember a specific injury or strain. No discoloration or significant swelling. The pain is located on the dorsal wrist more than volar. No numbness or weakness. No other joint pain.   The history is provided by the patient. No language interpreter was used.    Past Medical History:  Diagnosis Date  . Bipolar 1 disorder ([place])   . ETOH abuse   . H/O self-harm   . H/O suicide attempt    x 5 - last 05/2016 - overdose Ibuprofen  . Marijuana abuse     Patient Active Problem List   Diagnosis Date Noted  . Depression 05/20/2016  . Marijuana dependence ([place]) 05/19/2016    History reviewed. No pertinent surgical history.  OB History    No data available       Home Medications    Prior to Admission medications   Medication Sig Start Date End Date Taking? Authorizing Provider  HYDROcodone-acetaminophen (NORCO/VICODIN) 5-325 MG tablet Take 2 tablets by mouth every 4 (four) hours as needed. 01/14/17   Lacretia Leigh, MD  hydrOXYzine (ATARAX/VISTARIL) 25 MG tablet Take 1 tablet (25 mg total) by mouth 3 (three) times daily as needed for anxiety, nausea or vomiting. Patient not taking: Reported on 10/17/2016 05/21/16   Withrow, Elyse Jarvis, FNP  ibuprofen (ADVIL,MOTRIN) 600 MG tablet Take 1 tablet (600 mg total) by mouth every 6 (six) hours as needed. Patient not taking: Reported on 01/14/2017 10/17/16   Shary Decamp, PA-C  methocarbamol (ROBAXIN-750) 750 MG tablet Take 1 tablet (750 mg total) by mouth 4 (four) times daily. 01/14/17   Lacretia Leigh, MD    nicotine (NICODERM CQ - DOSED IN MG/24 HOURS) 21 mg/24hr patch Place 1 patch (21 mg total) onto the skin daily. Patient not taking: Reported on 10/17/2016 05/22/16   Benjamine Mola, FNP  predniSONE (STERAPRED UNI-PAK 21 TAB) 10 MG (21) TBPK tablet Take by mouth daily. Take 6 tabs by mouth daily  for 2 days, then 5 tabs for 2 days, then 4 tabs for 2 days, then 3 tabs for 2 days, 2 tabs for 2 days, then 1 tab by mouth daily for 2 days 01/14/17   Lacretia Leigh, MD  thiamine 100 MG tablet Take 1 tablet (100 mg total) by mouth daily. Patient not taking: Reported on 10/17/2016 05/22/16   Benjamine Mola, FNP  zolpidem (AMBIEN) 5 MG tablet Take 1 tablet (5 mg total) by mouth at bedtime as needed for sleep. Patient not taking: Reported on 10/17/2016 05/21/16   Benjamine Mola, FNP    Family History Family History  Problem Relation Age of Onset  . Hypertension Other     Social History Social History  Substance Use Topics  . Smoking status: Current Every Day Smoker    Packs/day: 0.25    Types: Cigarettes  . Smokeless tobacco: Never Used  . Alcohol use Yes     Comment: 1 bottle every 1 days     Allergies   Coconut flavor   Review of Systems  Review of Systems  Constitutional: Negative for chills and fever.  Musculoskeletal:       See HPI.  Skin: Negative.  Negative for color change and wound.  Neurological: Negative.  Negative for weakness and numbness.     Physical Exam Updated Vital Signs BP (!) 142/81 (BP Location: Left Arm)   Pulse 82   Temp 98.8 F (37.1 C) (Oral)   Resp 16   Ht 6' (1.829 m)   LMP 05/26/2017   SpO2 100%   Physical Exam  Constitutional: She is oriented to person, place, and time. She appears well-developed and well-nourished.  Neck: Normal range of motion.  Pulmonary/Chest: Effort normal.  Musculoskeletal:  Right wrist is unremarkable in appearance. No significant swelling and no discoloration. No warmth to the touch. FROM all digits of the right  hand. Cap RF <2s.  Neurological: She is alert and oriented to person, place, and time.  Skin: Skin is warm and dry.     ED Treatments / Results  Labs (all labs ordered are listed, but only abnormal results are displayed) Labs Reviewed - No data to display  EKG  EKG Interpretation None       Radiology No results found.  Procedures Procedures (including critical care time)  Medications Ordered in ED Medications  ibuprofen (ADVIL,MOTRIN) tablet 800 mg (not administered)  HYDROcodone-acetaminophen (NORCO/VICODIN) 5-325 MG per tablet 1 tablet (not administered)     Initial Impression / Assessment and Plan / ED Course  I have reviewed the triage vital signs and the nursing notes.  Pertinent labs & imaging results that were available during my care of the patient were reviewed by me and considered in my medical decision making (see chart for details).     Patient complains of pain in the right wrist without known injury that became significantly worse after going to bed tonight. Do not suspect fracture or infection. Will splint for comfort. Rx's for pain and inflammation, supportive care.   Final Clinical Impressions(s) / ED Diagnoses   Final diagnoses:  None   1. Right wrist pain  New Prescriptions New Prescriptions   No medications on file     Charlann Lange, Hershal Coria 05/26/17 0350    Orpah Greek, MD 05/27/17 [phone]

## 2017-08-09 ENCOUNTER — Other Ambulatory Visit: Payer: Self-pay

## 2017-08-09 ENCOUNTER — Emergency Department (HOSPITAL_COMMUNITY)
Admission: EM | Admit: 2017-08-09 | Discharge: 2017-08-09 | Disposition: A | Discharge status: Home / Self Care | Payer: Self-pay | Attending: Emergency Medicine | Admitting: Emergency Medicine

## 2017-08-09 ENCOUNTER — Emergency Department (HOSPITAL_BASED_OUTPATIENT_CLINIC_OR_DEPARTMENT_OTHER)
Admit: 2017-08-09 | Discharge: 2017-08-09 | Disposition: A | Discharge status: Home / Self Care | Payer: Self-pay | Attending: Emergency Medicine | Admitting: Emergency Medicine

## 2017-08-09 ENCOUNTER — Emergency Department (HOSPITAL_COMMUNITY): Payer: Self-pay

## 2017-08-09 ENCOUNTER — Encounter (HOSPITAL_COMMUNITY): Payer: Self-pay | Admitting: Emergency Medicine

## 2017-08-09 DIAGNOSIS — F17210 Nicotine dependence, cigarettes, uncomplicated: Secondary | ICD-10-CM | POA: Insufficient documentation

## 2017-08-09 DIAGNOSIS — M79609 Pain in unspecified limb: Secondary | ICD-10-CM

## 2017-08-09 DIAGNOSIS — M7121 Synovial cyst of popliteal space [Baker], right knee: Secondary | ICD-10-CM | POA: Insufficient documentation

## 2017-08-09 DIAGNOSIS — M7989 Other specified soft tissue disorders: Secondary | ICD-10-CM

## 2017-08-09 MED ORDER — IBUPROFEN 600 MG PO TABS: 600.0000 mg | ORAL_TABLET | Freq: Four times a day (QID) | ORAL | 0 refills | Status: DC | PRN

## 2017-08-09 NOTE — ED Provider Notes (Signed)
[facility] EMERGENCY DEPARTMENT Provider Note   CSN: [id number] Arrival date & time: 08/09/17  1053     History   Chief Complaint Chief Complaint  Patient presents with  . Knee Pain  . Leg Pain    HPI Tammy Garrison is a 27 y.o. female.  HPI Patient presents with right knee pain.  Goes down the leg and up to the hip.  Has had it for a while.  Reported been for months.  States more swelling and has some numbness in the foot.  No chest pain or trouble breathing.  No new trauma.  No fevers. Past Medical History:  Diagnosis Date  . Bipolar 1 disorder ([place])   . ETOH abuse   . H/O self-harm   . H/O suicide attempt    x 5 - last 05/2016 - overdose Ibuprofen  . Marijuana abuse     Patient Active Problem List   Diagnosis Date Noted  . Depression 05/20/2016  . Marijuana dependence ([place]) 05/19/2016    History reviewed. No pertinent surgical history.  OB History    No data available       Home Medications    Prior to Admission medications   Medication Sig Start Date End Date Taking? Authorizing Provider  acetaminophen (TYLENOL) 325 MG tablet Take 650 mg by mouth every 6 (six) hours as needed for mild pain.   Yes [provider]  naproxen sodium (ALEVE) 220 MG tablet Take 220 mg by mouth 2 (two) times daily as needed (pain).   Yes [provider]  HYDROcodone-acetaminophen (NORCO/VICODIN) 5-325 MG tablet Take 1-2 tablets by mouth every 4 (four) hours as needed. Patient not taking: Reported on 08/09/2017 05/26/17   Charlann Lange, PA-C  hydrOXYzine (ATARAX/VISTARIL) 25 MG tablet Take 1 tablet (25 mg total) by mouth 3 (three) times daily as needed for anxiety, nausea or vomiting. Patient not taking: Reported on 10/17/2016 05/21/16   Withrow, Elyse Jarvis, FNP  ibuprofen (ADVIL,MOTRIN) 600 MG tablet Take 1 tablet (600 mg total) by mouth every 6 (six) hours as needed. 08/09/17   Davonna Belling, MD  methocarbamol (ROBAXIN-750) 750 MG tablet  Take 1 tablet (750 mg total) by mouth 4 (four) times daily. Patient not taking: Reported on 08/09/2017 01/14/17   Lacretia Leigh, MD  nicotine (NICODERM CQ - DOSED IN MG/24 HOURS) 21 mg/24hr patch Place 1 patch (21 mg total) onto the skin daily. Patient not taking: Reported on 10/17/2016 05/22/16   Benjamine Mola, FNP  predniSONE (STERAPRED UNI-PAK 21 TAB) 10 MG (21) TBPK tablet Take by mouth daily. Take 6 tabs by mouth daily  for 2 days, then 5 tabs for 2 days, then 4 tabs for 2 days, then 3 tabs for 2 days, 2 tabs for 2 days, then 1 tab by mouth daily for 2 days Patient not taking: Reported on 08/09/2017 01/14/17   Lacretia Leigh, MD  thiamine 100 MG tablet Take 1 tablet (100 mg total) by mouth daily. Patient not taking: Reported on 10/17/2016 05/22/16   Benjamine Mola, FNP  zolpidem (AMBIEN) 5 MG tablet Take 1 tablet (5 mg total) by mouth at bedtime as needed for sleep. Patient not taking: Reported on 10/17/2016 05/21/16   Benjamine Mola, FNP    Family History Family History  Problem Relation Age of Onset  . Hypertension Other     Social History Social History   Tobacco Use  . Smoking status: Current Every Day Smoker    Packs/day: 0.25  Types: Cigarettes  . Smokeless tobacco: Never Used  Substance Use Topics  . Alcohol use: Yes    Comment: 1 bottle every 1 days  . Drug use: No     Allergies   Coconut flavor   Review of Systems Review of Systems  Constitutional: Negative for appetite change.  Respiratory: Negative for chest tightness and shortness of breath.   Cardiovascular: Positive for leg swelling. Negative for chest pain.  Gastrointestinal: Negative for abdominal pain.  Genitourinary: Negative for dysuria and flank pain.  Musculoskeletal: Positive for joint swelling. Negative for back pain.       Pain and swelling in right knee.  Numbness in right foot.  Skin: Negative for rash and wound.  Neurological: Positive for numbness.  Hematological: Negative for adenopathy.   Psychiatric/Behavioral: Negative for confusion.     Physical Exam Updated Vital Signs BP (!) 151/67   Pulse 84   Temp 98.5 F (36.9 C) (Oral)   Resp 18   Ht 6' (1.829 m)   Wt 113.4 kg (250 lb)   SpO2 100%   BMI 33.91 kg/m   Physical Exam  Constitutional: She appears well-developed.  HENT:  Head: Atraumatic.  Neck: Neck supple.  Cardiovascular: Normal rate.  Pulmonary/Chest: No respiratory distress.  Abdominal: There is no tenderness.  Musculoskeletal: She exhibits tenderness.  Effusion to right knee.  Some pain with movement.  Fullness behind the knee on right side.  Neurovascular grossly intact in right foot.  Some mild edema on right lower leg.  Mild pain with movement at the hip but states that his pain coming up from the knee.  Neurological: She is alert.  Skin: Skin is warm. Capillary refill takes less than 2 seconds.     ED Treatments / Results  Labs (all labs ordered are listed, but only abnormal results are displayed) Labs Reviewed - No data to display  EKG  EKG Interpretation None       Radiology Dg Knee Complete 4 Views Right  Result Date: 08/09/2017 CLINICAL DATA:  Right posterior knee pain and swelling for the past week. No known injury. EXAM: RIGHT KNEE - COMPLETE 4+ VIEW COMPARISON:  None in PACs FINDINGS: The bones are subjectively adequately mineralized. The joint spaces are reasonably well-maintained. There is a small suprapatellar effusion. There is no acute or healing fracture. No significant degenerative changes are observed. IMPRESSION: Small suprapatellar effusion.  No acute bony abnormality. Electronically Signed   By: David  Martinique M.D.   On: 08/09/2017 13:50    Procedures Procedures (including critical care time)  Medications Ordered in ED Medications - No data to display   Initial Impression / Assessment and Plan / ED Course  I have reviewed the triage vital signs and the nursing notes.  Pertinent labs & imaging results that were  available during my care of the patient were reviewed by me and considered in my medical decision making (see chart for details).     Patient with right knee pain with effusion.  Has Baker's cyst.  X-ray shows effusion.  Doubt infection.  Baker's cyst is likely cause of the pain.  Will give anti-inflammatories and Ortho follow-up as needed.  Final Clinical Impressions(s) / ED Diagnoses   Final diagnoses:  Synovial cyst of right popliteal space    ED Discharge Orders        Ordered    ibuprofen (ADVIL,MOTRIN) 600 MG tablet  Every 6 hours PRN     08/09/17 1457  Davonna Belling, MD 08/09/17 1500

## 2017-08-09 NOTE — Progress Notes (Signed)
VASCULAR LAB PRELIMINARY  PRELIMINARY  PRELIMINARY  PRELIMINARY  Right lower extremity venous duplex completed.    Preliminary report:  There is no DVT or SVT noted in the right lower extremity.  There is an large Baker's cyst noted in the right popliteal fossa.   Called Dr. Alvino Chapel with results.  Tammy Garrison, RVT 08/09/2017, 1:52 PM

## 2017-08-09 NOTE — ED Triage Notes (Signed)
Onset one week ago developed swelling right knee with intermittent pain radiating to down to right foot.  Denies trauma. Pain currently 5/10 tightness throbbing with intermittent right foot numbness.

## 2018-04-12 ENCOUNTER — Other Ambulatory Visit: Payer: Self-pay

## 2018-04-12 ENCOUNTER — Encounter (HOSPITAL_COMMUNITY): Payer: Self-pay | Admitting: *Deleted

## 2018-04-12 ENCOUNTER — Emergency Department (HOSPITAL_COMMUNITY)
Admission: EM | Admit: 2018-04-12 | Discharge: 2018-04-12 | Disposition: A | Discharge status: Home / Self Care | Payer: Self-pay | Attending: Emergency Medicine | Admitting: Emergency Medicine

## 2018-04-12 DIAGNOSIS — F17210 Nicotine dependence, cigarettes, uncomplicated: Secondary | ICD-10-CM | POA: Insufficient documentation

## 2018-04-12 DIAGNOSIS — D473 Essential (hemorrhagic) thrombocythemia: Secondary | ICD-10-CM | POA: Insufficient documentation

## 2018-04-12 DIAGNOSIS — M25542 Pain in joints of left hand: Secondary | ICD-10-CM

## 2018-04-12 DIAGNOSIS — D75839 Thrombocytosis, unspecified: Secondary | ICD-10-CM

## 2018-04-12 DIAGNOSIS — M25541 Pain in joints of right hand: Secondary | ICD-10-CM

## 2018-04-12 DIAGNOSIS — Z79899 Other long term (current) drug therapy: Secondary | ICD-10-CM | POA: Insufficient documentation

## 2018-04-12 DIAGNOSIS — R300 Dysuria: Secondary | ICD-10-CM | POA: Insufficient documentation

## 2018-04-12 LAB — COMPREHENSIVE METABOLIC PANEL
ALK PHOS: 91 U/L (ref 38–126)
ALT: 15 U/L (ref 0–44)
ANION GAP: 9 (ref 5–15)
AST: 18 U/L (ref 15–41)
Albumin: 3.6 g/dL (ref 3.5–5.0)
BUN: 8 mg/dL (ref 6–20)
CHLORIDE: 106 mmol/L (ref 98–111)
CO2: 27 mmol/L (ref 22–32)
CREATININE: 0.7 mg/dL (ref 0.44–1.00)
Calcium: 9.5 mg/dL (ref 8.9–10.3)
GFR calc non Af Amer: >60 mL/min (ref >60)
Glucose, Bld: 95 mg/dL (ref 70–99)
POTASSIUM: 3.8 mmol/L (ref 3.5–5.1)
SODIUM: 142 mmol/L (ref 135–145)
TOTAL PROTEIN: 8.7 g/dL — AB (ref 6.5–8.1)
Total Bilirubin: 0.4 mg/dL (ref 0.3–1.2)

## 2018-04-12 LAB — CBC WITH DIFFERENTIAL/PLATELET
BASOS ABS: 0 10*3/uL (ref 0.0–0.1)
Basophils Relative: 0 %
EOS ABS: 0.1 10*3/uL (ref 0.0–0.7)
Eosinophils Relative: 2 %
HCT: 29 % — ABNORMAL LOW (ref 36.0–46.0)
Hemoglobin: 8.4 g/dL — ABNORMAL LOW (ref 12.0–15.0)
LYMPHS ABS: 1.5 10*3/uL (ref 0.7–4.0)
Lymphocytes Relative: 21 %
MCH: 19.5 pg — AB (ref 26.0–34.0)
MCHC: 29 g/dL — AB (ref 30.0–36.0)
MCV: 67.3 fL — ABNORMAL LOW (ref 78.0–100.0)
MONO ABS: 0.7 10*3/uL (ref 0.1–1.0)
Monocytes Relative: 10 %
NEUTROS ABS: 4.8 10*3/uL (ref 1.7–7.7)
Neutrophils Relative %: 67 %
PLATELETS: 665 10*3/uL — AB (ref 150–400)
RBC: 4.31 MIL/uL (ref 3.87–5.11)
RDW: 19 % — AB (ref 11.5–15.5)
WBC: 7.1 10*3/uL (ref 4.0–10.5)

## 2018-04-12 LAB — URINALYSIS, ROUTINE W REFLEX MICROSCOPIC
BACTERIA UA: NONE SEEN
Bilirubin Urine: NEGATIVE
Glucose, UA: NEGATIVE mg/dL
Ketones, ur: NEGATIVE mg/dL
Leukocytes, UA: NEGATIVE
Nitrite: NEGATIVE
Protein, ur: NEGATIVE mg/dL
Specific Gravity, Urine: 1.016 (ref 1.005–1.030)
pH: 7 (ref 5.0–8.0)

## 2018-04-12 MED ORDER — PREDNISONE 10 MG (21) PO TBPK: ORAL_TABLET | Freq: Every day | ORAL | 0 refills | Status: DC

## 2018-04-12 MED ORDER — HYDROCODONE-ACETAMINOPHEN 5-325 MG PO TABS: 1.0000 | ORAL_TABLET | ORAL | 0 refills | Status: AC | PRN

## 2018-04-12 MED ORDER — SODIUM CHLORIDE 0.9 % IV BOLUS
1000.0000 mL | Freq: Once | INTRAVENOUS | Status: AC
  Administered 2018-04-12: 1000 mL via INTRAVENOUS

## 2018-04-12 MED ORDER — MORPHINE SULFATE (PF) 4 MG/ML IV SOLN
4.0000 mg | Freq: Once | INTRAVENOUS | Status: AC
  Administered 2018-04-12: 4 mg via INTRAVENOUS
  Filled 2018-04-12: qty 1

## 2018-04-12 MED ORDER — ONDANSETRON HCL 4 MG/2ML IJ SOLN
4.0000 mg | Freq: Once | INTRAMUSCULAR | Status: AC
  Administered 2018-04-12: 4 mg via INTRAVENOUS
  Filled 2018-04-12: qty 2

## 2018-04-12 NOTE — ED Triage Notes (Signed)
Pt reports being dx with arthritis is feb 2019.  Pt has been seen by rheumatologist and was given steroids that helped for a brief period of time.  Pt states that over the last 2-3 months, the pain has become progressively worse.  Today the pain is all over the body and pt feels like multiple areas of her body are locking up.

## 2018-04-12 NOTE — ED Provider Notes (Signed)
[facility] DEPT Provider Note   CSN: [id number] Arrival date & time: 04/12/18  1421   History   Chief Complaint Joint pain  HPI Tammy Garrison is a 27 y.o. female with a past medical history significant for rheumatoid arthritis who presents for evaluation of joint pain.  Patient states she was diagnosed with rheumatoid arthritis and February 2019.  She was seen by rheumatologist and started on prednisone which she took for approximately 3 months.  Patient states she lost her job and lost her health insurance so she was not able to afford follow-up from the rheumatologist.  Patient states she has had increasing joint pain over the last month.  States the pain has increased over the last 3 days which led her to seek further evaluation.  She has not been able to sleep secondary to pain.  States her pain is primarily located in the bilateral hands, bilateral shoulders and bilateral knees.  Pain is described as a severe aching.  States her metacarpal joints on bilateral hands have been swelling.  Feels like she cannot grip objects like she used to secondary to pain.  Pain is rated a 7 out of 10.  Does not radiate. Denies fever, chills, nausea, vomiting, chest pain, shortness of breath, abdominal pain, back pain, neck pain, diarrhea, constipation, rash.  Admits to dysuria x1 day.  Denies hematuria, foul odor. LMP 04/12/18.   Past Medical History:  Diagnosis Date  . Bipolar 1 disorder ([place])   . ETOH abuse   . H/O self-harm   . H/O suicide attempt    x 5 - last 05/2016 - overdose Ibuprofen  . Marijuana abuse     Patient Active Problem List   Diagnosis Date Noted  . Depression 05/20/2016  . Marijuana dependence ([place]) 05/19/2016    No past surgical history on file.   OB History   None     Home Medications    Prior to Admission medications   Medication Sig Start Date End Date Taking? Authorizing Provider  acetaminophen (TYLENOL) 650 MG CR tablet  Take 1,300 mg by mouth every 8 (eight) hours as needed for pain.   Yes [provider]  Ascorbic Acid (VITAMIN C PO) Take 1 tablet by mouth daily.   Yes [provider]  folic acid (FOLVITE) 1 MG tablet Take 1 mg by mouth daily. 01/24/18  Yes [provider]  naproxen sodium (ALEVE) 220 MG tablet Take 660 mg by mouth 3 (three) times daily as needed (pain).    Yes [provider]  trolamine salicylate (ASPERCREME) 10 % cream Apply 1 application topically as needed for muscle pain.   Yes [provider]  HYDROcodone-acetaminophen (NORCO/VICODIN) 5-325 MG tablet Take 1 tablet by mouth every 4 (four) hours as needed for up to 3 days for severe pain. 04/12/18 04/15/18  Yvonne Petite A, PA-C  hydrOXYzine (ATARAX/VISTARIL) 25 MG tablet Take 1 tablet (25 mg total) by mouth 3 (three) times daily as needed for anxiety, nausea or vomiting. Patient not taking: Reported on 04/12/2018 05/21/16   Withrow, Elyse Jarvis, FNP  ibuprofen (ADVIL,MOTRIN) 600 MG tablet Take 1 tablet (600 mg total) by mouth every 6 (six) hours as needed. Patient not taking: Reported on 04/12/2018 08/09/17   Davonna Belling, MD  methocarbamol (ROBAXIN-750) 750 MG tablet Take 1 tablet (750 mg total) by mouth 4 (four) times daily. Patient not taking: Reported on 04/12/2018 01/14/17   Lacretia Leigh, MD  nicotine (NICODERM CQ - DOSED IN  MG/24 HOURS) 21 mg/24hr patch Place 1 patch (21 mg total) onto the skin daily. Patient not taking: Reported on 04/12/2018 05/22/16   Withrow, Elyse Jarvis, FNP  predniSONE (STERAPRED UNI-PAK 21 TAB) 10 MG (21) TBPK tablet Take by mouth daily. Take 6 tabs by mouth daily  for 2 days, then 5 tabs for 2 days, then 4 tabs for 2 days, then 3 tabs for 2 days, 2 tabs for 2 days, then 1 tab by mouth daily for 2 days 04/12/18   Haziel Molner A, PA-C  thiamine 100 MG tablet Take 1 tablet (100 mg total) by mouth daily. Patient not taking: Reported on 04/12/2018 05/22/16   Withrow, Elyse Jarvis, FNP    zolpidem (AMBIEN) 5 MG tablet Take 1 tablet (5 mg total) by mouth at bedtime as needed for sleep. Patient not taking: Reported on 04/12/2018 05/21/16   Benjamine Mola, FNP    Family History Family History  Problem Relation Age of Onset  . Hypertension Other     Social History Social History   Tobacco Use  . Smoking status: Current Some Day Smoker    Packs/day: 0.25    Types: Cigarettes  . Smokeless tobacco: Never Used  Substance Use Topics  . Alcohol use: Yes    Comment: social  . Drug use: Yes    Types: Marijuana     Allergies   Coconut flavor   Review of Systems Review of Systems  All other systems reviewed and are negative.  Review of systems negative unless otherwise stated in the HPI  Physical Exam Updated Vital Signs BP (!) 157/97 (BP Location: Right Arm)   Pulse 85   Temp 99.2 F (37.3 C) (Oral)   Resp 18   LMP 04/10/2018   SpO2 100%   Physical Exam  Constitutional: She appears well-developed and well-nourished. No distress.  HENT:  Head: Normocephalic and atraumatic.  Mouth/Throat: Oropharynx is clear and moist.  Eyes: Pupils are equal, round, and reactive to light. Conjunctivae are normal.  Neck: Normal range of motion. Neck supple.  Cardiovascular: Normal rate, regular rhythm, normal heart sounds and intact distal pulses.  No murmur heard. Pulmonary/Chest: Effort normal and breath sounds normal. No stridor. No respiratory distress. She has no wheezes. She has no rales. She exhibits no tenderness.  Abdominal: Soft. Bowel sounds are normal. She exhibits no distension and no mass. There is no tenderness. There is no rebound and no guarding.  Musculoskeletal: Normal range of motion.  Tenderness to palpation over bilateral shoulders.  Negative Hawkins and empty can test.  Full passive range of motion to bilateral shoulders.  Tenderness to palpation over the MCPs and PIPs.  Bilateral mild swelling to the MCPs, worse on right hand.  Full range of motion  to bilateral upper and lower extremities.  5/ 5 grip strength. Full sensation.  Neurological: She is alert. She has normal strength. No sensory deficit. Gait normal.  Skin: Skin is warm and dry. She is not diaphoretic.  No erythema, warmth, ecchymosis to joints.  No rashes or lesions  Psychiatric: She has a normal mood and affect.  Nursing note and vitals reviewed.    ED Treatments / Results  Labs (all labs ordered are listed, but only abnormal results are displayed) Labs Reviewed  CBC WITH DIFFERENTIAL/PLATELET - Abnormal; Notable for the following components:      Result Value   Hemoglobin 8.4 (*)    HCT 29.0 (*)    MCV 67.3 (*)    MCH 19.5 (*)  MCHC 29.0 (*)    RDW 19.0 (*)    Platelets 665 (*)    All other components within normal limits  COMPREHENSIVE METABOLIC PANEL - Abnormal; Notable for the following components:   Total Protein 8.7 (*)    All other components within normal limits  URINALYSIS, ROUTINE W REFLEX MICROSCOPIC - Abnormal; Notable for the following components:   Hgb urine dipstick MODERATE (*)    All other components within normal limits    EKG None  Radiology No results found.  Procedures Procedures (including critical care time)  Medications Ordered in ED Medications  morphine 4 MG/ML injection 4 mg (4 mg Intravenous Given 04/12/18 1556)  ondansetron (ZOFRAN) injection 4 mg (4 mg Intravenous Given 04/12/18 1556)  sodium chloride 0.9 % bolus 1,000 mL ( Intravenous Stopped 04/12/18 1749)     Initial Impression / Assessment and Plan / ED Course  I have reviewed the triage vital signs and the nursing notes as well as past medical history.  Pertinent labs & imaging results that were available during my care of the patient were reviewed by me and considered in my medical decision making (see chart for details).  27 year old with history of rheumatoid arthritis presents for evaluation of increasing joint pain and dysuria.  Previously seen by  rheumatology and on continuous prednisone however has not been able to follow-up and is no longer or Prednisone.  Pain has been increasing over the last month worse over the last 3 days. Mild joint swelling and tenderness to palpation. No warmth or erythema. Dysuria x1 day. Afebrile, non-ill, non-septic appearing. Will obtain labs, urine, pain management and reevaluate.  For most likely at this point this is an RA flare.  Low suspicion for septic joint given history and physical exam.  On re-evaluation pain is controlled. Urine with Hbg. On cycle currently. WBC without leukocytosis. Elevated Platelets, most likely secondary to medications she has been taking for pain and her RA. Discussed with patient need for follow-up for re-evalaution of her Platelets. Mild HTN in ED at 157/97. Asymptomatic. Discussed follow-up for reevaluation of her blood pressure. Her joint pain is likely due to an acute RA exacerbation. Will dc on Prednisone and short course of pain medication.  Patient does not have an active prescription for narcotics in the PMP aware database. Discussed strict return precautions. Patient and friend voice understanding.    Final Clinical Impressions(s) / ED Diagnoses   Final diagnoses:  Arthralgia of both hands  Thrombocytosis [facility])    ED Discharge Orders         Ordered    predniSONE (STERAPRED UNI-PAK 21 TAB) 10 MG (21) TBPK tablet  Daily     04/12/18 1737    HYDROcodone-acetaminophen (NORCO/VICODIN) 5-325 MG tablet  Every 4 hours PRN     04/12/18 1737           Auriel Kist A, PA-C 04/12/18 Sheryle Spray, MD 04/12/18 2330

## 2018-04-12 NOTE — Discharge Instructions (Addendum)
You were evaluated today for joint pain.  Your labs did show an elevation in your platelet count. You will need to follow-up with your primary care provider for this. I feel the most likely cause of your pain in your joints is an exacerbation of your Rheumatoid Arthritis. I will prescribe you Prednisone and a short course of a pain medication. Do not drive or operate heavy machinery while taking the pain medication.You will need to follow up with your Rheumatologist for re-evalaution. Please return to the ED with any new or worsening symptoms such as:   Contact a doctor if: You have a flare. You have a fever. You have problems (side effects) because of your medicines. Get help right away if: You have chest pain. You have trouble breathing. You have a hot, painful joint all of a sudden, and it is worse than your usual joint aches.

## 2019-01-11 ENCOUNTER — Encounter (HOSPITAL_COMMUNITY): Payer: Self-pay

## 2019-01-11 ENCOUNTER — Other Ambulatory Visit: Payer: Self-pay

## 2019-01-11 ENCOUNTER — Emergency Department (HOSPITAL_COMMUNITY)
Admission: EM | Admit: 2019-01-11 | Discharge: 2019-01-11 | Disposition: A | Discharge status: Home / Self Care | Payer: Self-pay | Attending: Emergency Medicine | Admitting: Emergency Medicine

## 2019-01-11 DIAGNOSIS — Z79899 Other long term (current) drug therapy: Secondary | ICD-10-CM | POA: Insufficient documentation

## 2019-01-11 DIAGNOSIS — Z87891 Personal history of nicotine dependence: Secondary | ICD-10-CM | POA: Insufficient documentation

## 2019-01-11 DIAGNOSIS — M069 Rheumatoid arthritis, unspecified: Secondary | ICD-10-CM | POA: Insufficient documentation

## 2019-01-11 HISTORY — DX: Unspecified osteoarthritis, unspecified site: M19.90

## 2019-01-11 MED ORDER — PREDNISONE 10 MG (21) PO TBPK: ORAL_TABLET | ORAL | 0 refills | Status: AC

## 2019-01-11 MED ORDER — IBUPROFEN 600 MG PO TABS: 600.0000 mg | ORAL_TABLET | Freq: Four times a day (QID) | ORAL | 0 refills | Status: DC | PRN

## 2019-01-11 MED ORDER — PREDNISONE 10 MG (21) PO TBPK: ORAL_TABLET | ORAL | 0 refills | Status: DC

## 2019-01-11 NOTE — ED Notes (Signed)
Bed: WTR7 Expected date:  Expected time:  Means of arrival:  Comments: 

## 2019-01-11 NOTE — ED Provider Notes (Addendum)
[facility] DEPT Provider Note   CSN: [id number] Arrival date & time: 01/11/19  1112    History   Chief Complaint Chief Complaint  Patient presents with  . Generalized Body Aches    HPI Tammy Garrison is a 28 y.o. female.     HPI Patient presents to the emergency room for evaluation of diffuse joint pain.  Patient states she has a history of rheumatoid arthritis.  Patient states she is on a medication that might be methotrexate.  She used to be on steroids but has been off of those for a couple months.  Patient states she had an appointment with her arthritis doctor last month but it had to be rescheduled because the doctor had a family emergency.  Patient is not set to see her doctor for another couple of weeks.  Patient states she has been having difficulty with diffuse joint pain involving her arms legs neck shoulders.  She has not noticed any areas of swelling but they are very stiff and tender.  The symptoms are especially worse at night and she has not been sleeping well.  Patient was not able to go to work today because of her discomfort.  Patient tried to call her rheumatologist to be seen but she was told that they did not have any availability today.  They suggested she go to the ER if she was feeling that bad. Past Medical History:  Diagnosis Date  . Arthritis   . Bipolar 1 disorder ([place])   . ETOH abuse   . H/O self-harm   . H/O suicide attempt    x 5 - last 05/2016 - overdose Ibuprofen  . Marijuana abuse     Patient Active Problem List   Diagnosis Date Noted  . Depression 05/20/2016  . Marijuana dependence ([place]) 05/19/2016    History reviewed. No pertinent surgical history.   OB History   No obstetric history on file.      Home Medications    Prior to Admission medications   Medication Sig Start Date End Date Taking? Authorizing Provider  acetaminophen (TYLENOL) 650 MG CR tablet Take 1,300 mg by mouth every 8  (eight) hours as needed for pain.    [provider]  Ascorbic Acid (VITAMIN C PO) Take 1 tablet by mouth daily.    [provider]  folic acid (FOLVITE) 1 MG tablet Take 1 mg by mouth daily. 01/24/18   [provider]  hydrOXYzine (ATARAX/VISTARIL) 25 MG tablet Take 1 tablet (25 mg total) by mouth 3 (three) times daily as needed for anxiety, nausea or vomiting. Patient not taking: Reported on 04/12/2018 05/21/16   Withrow, Elyse Jarvis, FNP  ibuprofen (ADVIL) 600 MG tablet Take 1 tablet (600 mg total) by mouth every 6 (six) hours as needed. 01/11/19   Dorie Rank, MD  methocarbamol (ROBAXIN-750) 750 MG tablet Take 1 tablet (750 mg total) by mouth 4 (four) times daily. Patient not taking: Reported on 04/12/2018 01/14/17   Lacretia Leigh, MD  nicotine (NICODERM CQ - DOSED IN MG/24 HOURS) 21 mg/24hr patch Place 1 patch (21 mg total) onto the skin daily. Patient not taking: Reported on 04/12/2018 05/22/16   Withrow, Elyse Jarvis, FNP  predniSONE (STERAPRED UNI-PAK 21 TAB) 10 MG (21) TBPK tablet Take 6 tablets (60 mg total) by mouth daily for 2 days, THEN 5 tablets (50 mg total) daily for 2 days, THEN 4 tablets (40 mg total) daily for 2 days, THEN 3 tablets (30 mg  total) daily for 2 days, THEN 2 tablets (20 mg total) daily for 2 days, THEN 1 tablet (10 mg total) daily for 2 days. 01/11/19 01/23/19  Dorie Rank, MD  thiamine 100 MG tablet Take 1 tablet (100 mg total) by mouth daily. Patient not taking: Reported on 04/12/2018 05/22/16   Benjamine Mola, FNP  trolamine salicylate (ASPERCREME) 10 % cream Apply 1 application topically as needed for muscle pain.    [provider]  zolpidem (AMBIEN) 5 MG tablet Take 1 tablet (5 mg total) by mouth at bedtime as needed for sleep. Patient not taking: Reported on 04/12/2018 05/21/16   Benjamine Mola, FNP    Family History Family History  Problem Relation Age of Onset  . Hypertension Other   . Lupus Mother     Social History Social History    Tobacco Use  . Smoking status: Former Smoker    Packs/day: 0.25    Types: Cigarettes  . Smokeless tobacco: Never Used  Substance Use Topics  . Alcohol use: Not Currently    Comment: social  . Drug use: Not Currently    Types: Marijuana     Allergies   Coconut flavor   Review of Systems Review of Systems  Constitutional: Negative for fever.  Respiratory: Negative for chest tightness.   Cardiovascular: Negative for chest pain.  Gastrointestinal: Negative for abdominal pain.  Genitourinary: Negative for dysuria.  Skin: Negative for rash.  All other systems reviewed and are negative.    Physical Exam Updated Vital Signs BP (!) 144/90 (BP Location: Left Arm)   Pulse 92   Temp 99.3 F (37.4 C) (Oral)   Resp 19   Ht 1.829 m (6')   Wt 113.9 kg   LMP 01/11/2019   SpO2 100%   BMI 34.04 kg/m   Physical Exam Vitals signs and nursing note reviewed.  Constitutional:      General: She is not in acute distress.    Appearance: She is well-developed.  HENT:     Head: Normocephalic and atraumatic.     Right Ear: External ear normal.     Left Ear: External ear normal.  Eyes:     General: No scleral icterus.       Right eye: No discharge.        Left eye: No discharge.     Conjunctiva/sclera: Conjunctivae normal.  Neck:     Musculoskeletal: Neck supple.     Trachea: No tracheal deviation.  Cardiovascular:     Rate and Rhythm: Normal rate and regular rhythm.  Pulmonary:     Effort: Pulmonary effort is normal. No respiratory distress.     Breath sounds: Normal breath sounds. No stridor. No wheezing or rales.  Abdominal:     General: Bowel sounds are normal. There is no distension.     Palpations: Abdomen is soft.     Tenderness: There is no abdominal tenderness. There is no guarding or rebound.  Musculoskeletal:        General: Tenderness present. No swelling.     Comments: Diffuse tenderness in the joints but no overt areas of swelling or erythema  Skin:     General: Skin is warm and dry.     Findings: No rash.  Neurological:     Mental Status: She is alert.     Cranial Nerves: No cranial nerve deficit (no facial droop, extraocular movements intact, no slurred speech).     Sensory: No sensory deficit.     Motor:  No abnormal muscle tone or seizure activity.     Coordination: Coordination normal.      ED Treatments / Results   Procedures Procedures (including critical care time)  Medications Ordered in ED Medications - No data to display   Initial Impression / Assessment and Plan / ED Course  I have reviewed the triage vital signs and the nursing notes.  Pertinent labs & imaging results that were available during my care of the patient were reviewed by me and considered in my medical decision making (see chart for details).   Pt has history of RA.  Pt's sx are concerning for possible RA flare.  Pt is not having fever or other symptoms to suggest acute infection.  Will dc home on a steroid taper to help with her sx until she can see her rheumatologist.   Final Clinical Impressions(s) / ED Diagnoses   Final diagnoses:  Rheumatoid arthritis flare [facility])    ED Discharge Orders         Ordered    predniSONE (STERAPRED UNI-PAK 21 TAB) 10 MG (21) TBPK tablet     01/11/19 1606    ibuprofen (ADVIL) 600 MG tablet  Every 6 hours PRN     01/11/19 1606           Dorie Rank, MD 01/11/19 1606

## 2019-01-11 NOTE — ED Triage Notes (Signed)
Patient c/o generalized body aches and joint pain. Patient reports history of rheumatoid arthritis. Patient states she has an appointment with physician which was rescheduled. Patient called to see if she could be seen sooner and then patient was told to come to the ED.

## 2019-01-11 NOTE — Discharge Instructions (Addendum)
Take the medications as prescribed, follow-up with your rheumatologist as soon as possible

## 2019-02-27 ENCOUNTER — Telehealth: Payer: Self-pay | Admitting: Hematology

## 2019-02-27 NOTE — Telephone Encounter (Signed)
Confirmed with patient 8/17 new patient visit with Dr. Burr Medico at 2:30 pm.

## 2019-03-15 ENCOUNTER — Ambulatory Visit
Admission: RE | Admit: 2019-03-15 | Discharge: 2019-03-15 | Disposition: A | Discharge status: Home / Self Care | Payer: 59 | Source: Ambulatory Visit | Attending: Family Medicine | Admitting: Family Medicine

## 2019-03-15 ENCOUNTER — Other Ambulatory Visit: Payer: Self-pay | Admitting: Family Medicine

## 2019-03-15 DIAGNOSIS — M7989 Other specified soft tissue disorders: Secondary | ICD-10-CM

## 2019-03-15 DIAGNOSIS — M79605 Pain in left leg: Secondary | ICD-10-CM

## 2019-03-17 NOTE — Progress Notes (Signed)
[place]   [phone]) [phone] Fax:([phone]) [place] Note   Patient Care Team: [name], DO as PCP - General (Family Medicine) [name], MD (Rheumatology) Tyson Dense, MD as Consulting Physician (Obstetrics and Gynecology)  Date of Service:  03/20/2019   CHIEF COMPLAINTS/PURPOSE OF CONSULTATION:  Anemia, iron deficiency   REFERRING PHYSICIAN:  Dr. Lucillie Garfinkel  HISTORY OF PRESENTING ILLNESS:  Tammy Garrison 28 y.o. female is a here because of Anemia. The patient was referred by Dr Lucillie Garfinkel. The patient presents to the clinic today alone.   She notes she has been anemic for 2 years. She notes her menorrhagia is significant. She has period for 7 days with heavy flow for first 3-4 days. She notes she uses tampons and pads which she has to change both every 30 minutes. She notes she saw her Gyn who recently started her on oral birth control. Her period started 03/17/19 the same day she stated her birth control.  She notes fatigue from her anemia.  She notes she has been taking OTC 65mg  oral iron BID. She also takes folic acid. She does have constipation from her iron pill. She uses stool softeners but one pill does not help her.   Today the patient notes she only sleeps during the day for 1 hour but does not sleep much at night. She notes her RA in her upper body strongly effect her sleep. She has been on methotrexate 10mg . She has Robaxin for her pain.   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 She plans to go back next month. She is single with no children.  She is no longer on marijuana. She quit smoking tobacco 1 month ago in 02/2019. She smoked a black a week for 7 years.  Other than her Rheumatoid arthritis she has not significant medical history or surgeries. She often has boils so she is seen by her dermatologist. Her norther has lupus. She denies  family h/o cancer or anemia.    REVIEW OF SYSTEMS:   Constitutional: Denies fevers, chills or abnormal night sweats Eyes: Denies blurriness of vision, double vision or watery eyes Ears, nose, mouth, throat, and face: Denies mucositis or sore throat Respiratory: Denies cough, dyspnea or wheezes Cardiovascular: Denies palpitation, chest discomfort or lower extremity swelling Gastrointestinal:  Denies nausea, heartburn (+) Constipation  UA: (+) Menorrhagia  Skin: Denies abnormal skin rashes MSK: (+) Rheumatoid arthritis, primarily in her upper body  Lymphatics: Denies new lymphadenopathy or easy bruising Neurological:Denies numbness, tingling or new weaknesses Behavioral/Psych: Mood is stable, no new changes  All other systems were reviewed with the patient and are negative.   MEDICAL HISTORY:  Past Medical History:  Diagnosis Date   Arthritis    Bipolar 1 disorder ([place])    ETOH abuse    H/O self-harm    H/O suicide attempt    x 5 - last 05/2016 - overdose Ibuprofen   Marijuana abuse     SURGICAL HISTORY: History reviewed. No pertinent surgical history.  SOCIAL HISTORY: Social History   Socioeconomic History   Marital status: Single    Spouse name: Not on file   Number of children: Not on file   Years of education: Not on file   Highest education level: Not on file  Occupational History   Occupation: call center   Social Needs   Financial resource strain: Not on  file   Food insecurity    Worry: Not on file    Inability: Not on file   Transportation needs    Medical: Not on file    Non-medical: Not on file  Tobacco Use   Smoking status: Former Smoker    Packs/day: 0.25    Years: 7.00    Pack years: 1.75    Types: Cigarettes    Quit date: 02/17/2019    Years since quitting: 0.0   Smokeless tobacco: Never Used  Substance and Sexual Activity   Alcohol use: Not Currently    Comment: social   Drug use: Not Currently    Types: Marijuana    Sexual activity: Yes    Birth control/protection: None  Lifestyle   Physical activity    Days per week: Not on file    Minutes per session: Not on file   Stress: Not on file  Relationships   Social connections    Talks on phone: Not on file    Gets together: Not on file    Attends religious service: Not on file    Active member of club or organization: Not on file    Attends meetings of clubs or organizations: Not on file    Relationship status: Not on file   Intimate partner violence    Fear of current or ex partner: Not on file    Emotionally abused: Not on file    Physically abused: Not on file    Forced sexual activity: Not on file  Other Topics Concern   Not on file  Social History Narrative   Not on file    FAMILY HISTORY: Family History  Problem Relation Age of Onset   Hypertension Other    Lupus Mother     ALLERGIES:  is allergic to coconut flavor.  MEDICATIONS:  Current Outpatient Medications  Medication Sig Dispense Refill   acetaminophen (TYLENOL) 650 MG CR tablet Take 1,300 mg by mouth every 8 (eight) hours as needed for pain.     Ascorbic Acid (VITAMIN C PO) Take 1 tablet by mouth daily.     folic acid (FOLVITE) 1 MG tablet Take 1 mg by mouth daily.  0   ibuprofen (ADVIL) 600 MG tablet Take 1 tablet (600 mg total) by mouth every 6 (six) hours as needed. 28 tablet 0   trolamine salicylate (ASPERCREME) 10 % cream Apply 1 application topically as needed for muscle pain.     hydrOXYzine (ATARAX/VISTARIL) 25 MG tablet Take 1 tablet (25 mg total) by mouth 3 (three) times daily as needed for anxiety, nausea or vomiting. (Patient not taking: Reported on 04/12/2018) 90 tablet 0   methocarbamol (ROBAXIN-750) 750 MG tablet Take 1 tablet (750 mg total) by mouth 4 (four) times daily. (Patient not taking: Reported on 04/12/2018) 30 tablet 0   methotrexate 2.5 MG tablet Take 4 tablets by mouth once a week.     Naproxen Sodium (ALEVE) 220 MG CAPS Take 1  tablet by mouth as needed.     nicotine (NICODERM CQ - DOSED IN MG/24 HOURS) 21 mg/24hr patch Place 1 patch (21 mg total) onto the skin daily. (Patient not taking: Reported on 04/12/2018) 28 patch 0   predniSONE (DELTASONE) 10 MG tablet Take 1 tablet by mouth 2 (two) times daily.     thiamine 100 MG tablet Take 1 tablet (100 mg total) by mouth daily. (Patient not taking: Reported on 04/12/2018) 30 tablet 0   XELJANZ XR 11 MG TB24 Not started  yet     zolpidem (AMBIEN) 5 MG tablet Take 1 tablet (5 mg total) by mouth at bedtime as needed for sleep. (Patient not taking: Reported on 04/12/2018) 30 tablet 0   No current facility-administered medications for this visit.     PHYSICAL EXAMINATION: ECOG PERFORMANCE STATUS: 1 - Symptomatic but completely ambulatory  Vitals:   03/20/19 1433  BP: (!) 146/99  Pulse: 98  Resp: 17  Temp: 98.7 F (37.1 C)  SpO2: 100%   Filed Weights   03/20/19 1433  Weight: 273 lb 14.4 oz (124.2 kg)    GENERAL:alert, no distress and comfortable SKIN: skin color, texture, turgor are normal, no rashes or significant lesions EYES: normal, Conjunctiva are pink and non-injected, sclera clear  NECK: supple, thyroid normal size, non-tender, without nodularity LYMPH:  no palpable lymphadenopathy in the cervical, axillary  LUNGS: clear to auscultation and percussion with normal breathing effort HEART: regular rate & rhythm and no murmurs and no lower extremity edema ABDOMEN:abdomen soft, non-tender and normal bowel sounds Musculoskeletal:no cyanosis of digits and no clubbing  NEURO: alert & oriented x 3 with fluent speech, no focal motor/sensory deficits  LABORATORY DATA:  I have reviewed the data as listed CBC Latest Ref Rng & Units 03/20/2019 04/12/2018 05/18/2016  WBC 4.0 - 10.5 K/uL 9.9 7.1 7.3  Hemoglobin 12.0 - 15.0 g/dL 9.0(L) 8.4(L) 12.3  Hematocrit 36.0 - 46.0 % 30.7(L) 29.0(L) 37.6  Platelets 150 - 400 K/uL 558(H) 665(H) 407(H)    CMP Latest Ref Rng &  Units 04/12/2018 05/18/2016 05/18/2016  Glucose 70 - 99 mg/dL 95 - 98  BUN 6 - 20 mg/dL 8 - 6  Creatinine 0.44 - 1.00 mg/dL 0.70 - 0.76  Sodium 135 - 145 mmol/L 142 - 138  Potassium 3.5 - 5.1 mmol/L 3.8 - 4.5  Chloride 98 - 111 mmol/L 106 - 108  CO2 22 - 32 mmol/L 27 - 22  Calcium 8.9 - 10.3 mg/dL 9.5 - 9.6  Total Protein 6.5 - 8.1 g/dL 8.7(H) 7.0 7.7  Total Bilirubin 0.3 - 1.2 mg/dL 0.4 1.0 1.6(H)  Alkaline Phos 38 - 126 U/L 91 59 57  AST 15 - 41 U/L 18 17 28   ALT 0 - 44 U/L 15 16 18    OUTSIDE LABS             RADIOGRAPHIC STUDIES: I have personally reviewed the radiological images as listed and agreed with the findings in the report. US Venous Img Lower Unilateral Left  Result Date: 03/15/2019 CLINICAL DATA:  Pain and swelling x2 days EXAM: LEFT LOWER EXTREMITY VENOUS DOPPLER ULTRASOUND TECHNIQUE: Gray-scale sonography with compression, as well as color and duplex ultrasound, were performed to evaluate the deep venous system from the level of the common femoral vein through the popliteal and proximal calf veins. COMPARISON:  None FINDINGS: Normal compressibility of the common femoral, superficial femoral, and popliteal veins, as well as the proximal calf veins. No filling defects to suggest DVT on grayscale or color Doppler imaging. Doppler waveforms show normal direction of venous flow, normal respiratory phasicity and response to augmentation. Visualized segments of the saphenous venous system normal in caliber and compressibility. Deep elongated fluid collections are identified about the knee measuring up to 12.5 cm in the medial calf. Subcutaneous edema at the ankle level. Survey views of the contralateral common femoral vein are unremarkable. IMPRESSION: 1. No femoropopliteal and no calf DVT in the visualized calf veins. If clinical symptoms are inconsistent or if there are persistent or  worsening symptoms, further imaging (possibly involving the iliac veins) may be warranted. 2.  Deep fluid collections about the knee, possibly ruptured Baker's cyst. If symptoms persist, MR may be useful for further evaluation. Electronically Signed   By: Lucrezia Europe M.D.   On: 03/15/2019 15:27    ASSESSMENT & PLAN:  Tammy Garrison is a 28 y.o. African American female with a history of morbid obesity, smoking, rheumatoid arthritis.    1. Iron deficient anemia, secondary to significant menorrhagia -I reviewed her outside labs in great details. She has had anemia for 2 years, with Hg in 9-10 range, low MCV,  low iron and ferritin level, which are consistent with iron deficient anemia.  Her iron deficiency is likely related to her menorrhagia.  Given her young age and lack of GI symptoms, I think as a source of bleeding celiac disease are unlikely. -I will also check folate acid and B12 level to rule out other nutritional anemia --She has been taking oral iron 65mg  BID for 8 months now. She also has been on oral folic acid.  -I will repeat her labs today.  If she does not respond adequately I discussed IV iron for more direct intervention. I discussed frequency depends on how she responds. I discussed the risk of allergy reaction including anaphylaxis. She is interested and agrees to proceed. -She is followed by her Gyn Dr. Royston Sinner who started her on oral birth control on 03/17/19. Will monitor how her menorrhagia responds.  -I also discussed the role of diet in her anemia. I encouraged her to increase iron in her diet with red meat and certain vegetables.  -If her anemia does not respond to IV iron, will do further workup for other causes. She is on Methotrexate and has Rheumatoid arthritis which can cause mild drop in blood counts. I do not have suspicion for genetic or bone marrow abnormalities as the cause.  -Physical exam today was unremarkable -Labs today with CBC, CMP, Iron panel, retic ct, folic acid, Q30 levels.  -F/un in 3-4 months with repeated lab in the interim    2.  Rheumatoid arthritis, Obesity  -Managed be Dr. Dossie Der  -Her pain is mainly in her upper body -She is on 10mg  Methotrexate  -She takes Robaxin for her pain  -I discussed losing weight which can improve the pressure on her joints. She notes she has tried in the past to lose weight.    3. Smoking cessation  -She quit smoking tobacco products in mid 02/2019  -She use to smoke Marijuana in the past, has stopped now    PLAN:  -Lab today, I will call her with the results, and set up IV Feraheme if she has significant anemia and iron deficiency. -F/u in 3-4 months with lab in the interim    Orders Placed This Encounter  Procedures   CBC with Differential ([place] Only)    Standing Status:   Standing    Number of Occurrences:   50    Standing Expiration Date:   03/19/2024   Ferritin    Standing Status:   Standing    Number of Occurrences:   50    Standing Expiration Date:   03/19/2024   Iron and TIBC    Standing Status:   Standing    Number of Occurrences:   50    Standing Expiration Date:   03/19/2024   Folate RBC    Standing Status:   Future    Number of Occurrences:  1    Standing Expiration Date:   03/19/2020   Vitamin B12    Standing Status:   Future    Number of Occurrences:   1    Standing Expiration Date:   03/19/2020   Retic Panel    Standing Status:   Standing    Number of Occurrences:   50    Standing Expiration Date:   03/19/2024    All questions were answered. The patient knows to call the clinic with any problems, questions or concerns. I spent 25 minutes counseling the patient face to face. The total time spent in the appointment was 30 minutes and more than 50% was on counseling.     Truitt Merle, MD 03/20/2019 10:53 PM  I, Joslyn Devon, am acting as scribe for Truitt Merle, MD.   I have reviewed the above documentation for accuracy and completeness, and I agree with the above.

## 2019-03-20 ENCOUNTER — Other Ambulatory Visit: Payer: Self-pay

## 2019-03-20 ENCOUNTER — Telehealth: Payer: Self-pay | Admitting: Hematology

## 2019-03-20 ENCOUNTER — Inpatient Hospital Stay: Payer: Managed Care, Other (non HMO)

## 2019-03-20 ENCOUNTER — Encounter: Payer: Self-pay | Admitting: Hematology

## 2019-03-20 ENCOUNTER — Inpatient Hospital Stay: Payer: Managed Care, Other (non HMO) | Attending: Hematology | Admitting: Hematology

## 2019-03-20 VITALS — BP 146/99 | HR 98 | Temp 98.7°F | Resp 17 | Ht 72.0 in | Wt 273.9 lb

## 2019-03-20 DIAGNOSIS — N920 Excessive and frequent menstruation with regular cycle: Secondary | ICD-10-CM | POA: Diagnosis not present

## 2019-03-20 DIAGNOSIS — D500 Iron deficiency anemia secondary to blood loss (chronic): Secondary | ICD-10-CM | POA: Diagnosis present

## 2019-03-20 DIAGNOSIS — M069 Rheumatoid arthritis, unspecified: Secondary | ICD-10-CM | POA: Diagnosis not present

## 2019-03-20 DIAGNOSIS — Z79899 Other long term (current) drug therapy: Secondary | ICD-10-CM | POA: Diagnosis not present

## 2019-03-20 DIAGNOSIS — Z87891 Personal history of nicotine dependence: Secondary | ICD-10-CM | POA: Diagnosis not present

## 2019-03-20 DIAGNOSIS — E669 Obesity, unspecified: Secondary | ICD-10-CM | POA: Insufficient documentation

## 2019-03-20 LAB — VITAMIN B12: Vitamin B-12: 600 pg/mL (ref 180–914)

## 2019-03-20 LAB — CBC WITH DIFFERENTIAL (CANCER CENTER ONLY)
Abs Immature Granulocytes: 0.03 10*3/uL (ref 0.00–0.07)
Basophils Absolute: 0 10*3/uL (ref 0.0–0.1)
Basophils Relative: 0 %
Eosinophils Absolute: 0.2 10*3/uL (ref 0.0–0.5)
Eosinophils Relative: 2 %
HCT: 30.7 % — ABNORMAL LOW (ref 36.0–46.0)
Hemoglobin: 9 g/dL — ABNORMAL LOW (ref 12.0–15.0)
Immature Granulocytes: 0 %
Lymphocytes Relative: 21 %
Lymphs Abs: 2.1 10*3/uL (ref 0.7–4.0)
MCH: 21.8 pg — ABNORMAL LOW (ref 26.0–34.0)
MCHC: 29.3 g/dL — ABNORMAL LOW (ref 30.0–36.0)
MCV: 74.3 fL — ABNORMAL LOW (ref 80.0–100.0)
Monocytes Absolute: 1 10*3/uL (ref 0.1–1.0)
Monocytes Relative: 10 %
Neutro Abs: 6.5 10*3/uL (ref 1.7–7.7)
Neutrophils Relative %: 67 %
Platelet Count: 558 10*3/uL — ABNORMAL HIGH (ref 150–400)
RBC: 4.13 MIL/uL (ref 3.87–5.11)
RDW: 19.1 % — ABNORMAL HIGH (ref 11.5–15.5)
WBC Count: 9.9 10*3/uL (ref 4.0–10.5)
nRBC: 0 % (ref 0.0–0.2)

## 2019-03-20 LAB — RETIC PANEL
Immature Retic Fract: 25 % — ABNORMAL HIGH (ref 2.3–15.9)
RBC.: 4.1 MIL/uL (ref 3.87–5.11)
Retic Count, Absolute: 64.4 10*3/uL (ref 19.0–186.0)
Retic Ct Pct: 1.6 % (ref 0.4–3.1)
Reticulocyte Hemoglobin: 21.9 pg — ABNORMAL LOW (ref >27.9)

## 2019-03-20 NOTE — Telephone Encounter (Signed)
Scheduled per 08/17 los, patient received avs.

## 2019-03-21 LAB — IRON AND TIBC
Iron: 17 ug/dL — ABNORMAL LOW (ref 41–142)
Saturation Ratios: 3 % — ABNORMAL LOW (ref 21–57)
TIBC: 478 ug/dL — ABNORMAL HIGH (ref 236–444)
UIBC: 461 ug/dL — ABNORMAL HIGH (ref 120–384)

## 2019-03-21 LAB — FOLATE RBC
Folate, Hemolysate: 257 ng/mL
Folate, RBC: 857 ng/mL (ref >498)
Hematocrit: 30 % — ABNORMAL LOW (ref 34.0–46.6)

## 2019-03-21 LAB — FERRITIN: Ferritin: 12 ng/mL (ref 11–307)

## 2019-03-23 ENCOUNTER — Telehealth: Payer: Self-pay

## 2019-03-23 NOTE — Telephone Encounter (Signed)
Spoke with patient regarding lab results, per Dr. Burr Medico informed her iron level is low and hemoglobin in low 9.0, Dr. Burr Medico recommends iv iron weekly x 2 weeks and repeat labs in 4 to 5 weeks.  The patient is in agreement with the plan.  A scheduling message was sent.

## 2019-03-23 NOTE — Telephone Encounter (Signed)
-----   Message from Truitt Merle, MD sent at 03/23/2019  8:06 AM EDT ----- Please let pt know her iron level is low and Hg 9.0, I will set up iv iron weekly X2 in next 2-3 weeks, and repeat lab in 4-5 weeks, thanks  Truitt Merle  03/23/2019

## 2019-03-24 ENCOUNTER — Telehealth: Payer: Self-pay | Admitting: Hematology

## 2019-03-24 NOTE — Telephone Encounter (Signed)
Scheduled appt per 8/20 sch message - unable to reach pt and unable to leave message ( vmail full ) - mailed letter for appts scheduled

## 2019-04-04 ENCOUNTER — Other Ambulatory Visit: Payer: Self-pay | Admitting: Hematology

## 2019-04-04 DIAGNOSIS — D500 Iron deficiency anemia secondary to blood loss (chronic): Secondary | ICD-10-CM | POA: Insufficient documentation

## 2019-04-07 ENCOUNTER — Other Ambulatory Visit: Payer: Self-pay

## 2019-04-07 ENCOUNTER — Inpatient Hospital Stay: Payer: Managed Care, Other (non HMO) | Attending: Hematology

## 2019-04-07 VITALS — BP 123/78 | HR 89 | Temp 98.7°F | Resp 18

## 2019-04-07 DIAGNOSIS — N920 Excessive and frequent menstruation with regular cycle: Secondary | ICD-10-CM | POA: Insufficient documentation

## 2019-04-07 DIAGNOSIS — D500 Iron deficiency anemia secondary to blood loss (chronic): Secondary | ICD-10-CM | POA: Insufficient documentation

## 2019-04-07 MED ORDER — SODIUM CHLORIDE 0.9 % IV SOLN
510.0000 mg | Freq: Once | INTRAVENOUS | Status: AC
  Administered 2019-04-07: 510 mg via INTRAVENOUS
  Filled 2019-04-07: qty 17

## 2019-04-07 MED ORDER — SODIUM CHLORIDE 0.9 % IV SOLN
Freq: Once | INTRAVENOUS | Status: AC
  Administered 2019-04-07: 16:00:00 via INTRAVENOUS
  Filled 2019-04-07: qty 250

## 2019-04-07 NOTE — Patient Instructions (Signed)

## 2019-04-14 ENCOUNTER — Inpatient Hospital Stay: Payer: Managed Care, Other (non HMO)

## 2019-04-14 ENCOUNTER — Other Ambulatory Visit: Payer: Self-pay

## 2019-04-14 VITALS — BP 118/73 | HR 92 | Temp 98.6°F | Resp 20

## 2019-04-14 DIAGNOSIS — D500 Iron deficiency anemia secondary to blood loss (chronic): Secondary | ICD-10-CM | POA: Diagnosis not present

## 2019-04-14 MED ORDER — SODIUM CHLORIDE 0.9 % IV SOLN
Freq: Once | INTRAVENOUS | Status: AC
  Administered 2019-04-14: 15:00:00 via INTRAVENOUS
  Filled 2019-04-14: qty 250

## 2019-04-14 MED ORDER — SODIUM CHLORIDE 0.9 % IV SOLN
510.0000 mg | Freq: Once | INTRAVENOUS | Status: AC
  Administered 2019-04-14: 510 mg via INTRAVENOUS
  Filled 2019-04-14: qty 510

## 2019-04-14 NOTE — Patient Instructions (Signed)

## 2019-04-21 ENCOUNTER — Inpatient Hospital Stay: Payer: Managed Care, Other (non HMO)

## 2019-04-21 ENCOUNTER — Other Ambulatory Visit: Payer: Self-pay

## 2019-04-21 DIAGNOSIS — D500 Iron deficiency anemia secondary to blood loss (chronic): Secondary | ICD-10-CM | POA: Diagnosis not present

## 2019-04-21 LAB — CBC WITH DIFFERENTIAL (CANCER CENTER ONLY)
Abs Immature Granulocytes: 0.01 10*3/uL (ref 0.00–0.07)
Basophils Absolute: 0 10*3/uL (ref 0.0–0.1)
Basophils Relative: 0 %
Eosinophils Absolute: 0.1 10*3/uL (ref 0.0–0.5)
Eosinophils Relative: 2 %
HCT: 33.7 % — ABNORMAL LOW (ref 36.0–46.0)
Hemoglobin: 10.4 g/dL — ABNORMAL LOW (ref 12.0–15.0)
Immature Granulocytes: 0 %
Lymphocytes Relative: 19 %
Lymphs Abs: 1.2 10*3/uL (ref 0.7–4.0)
MCH: 24.5 pg — ABNORMAL LOW (ref 26.0–34.0)
MCHC: 30.9 g/dL (ref 30.0–36.0)
MCV: 79.3 fL — ABNORMAL LOW (ref 80.0–100.0)
Monocytes Absolute: 0.6 10*3/uL (ref 0.1–1.0)
Monocytes Relative: 10 %
Neutro Abs: 4.4 10*3/uL (ref 1.7–7.7)
Neutrophils Relative %: 69 %
Platelet Count: 298 10*3/uL (ref 150–400)
RBC: 4.25 MIL/uL (ref 3.87–5.11)
RDW: 24.2 % — ABNORMAL HIGH (ref 11.5–15.5)
WBC Count: 6.4 10*3/uL (ref 4.0–10.5)
nRBC: 0 % (ref 0.0–0.2)

## 2019-04-21 LAB — RETIC PANEL
Immature Retic Fract: 5 % (ref 2.3–15.9)
RBC.: 4.22 MIL/uL (ref 3.87–5.11)
Retic Count, Absolute: 61.2 10*3/uL (ref 19.0–186.0)
Retic Ct Pct: 1.5 % (ref 0.4–3.1)
Reticulocyte Hemoglobin: 36.5 pg (ref >27.9)

## 2019-04-21 LAB — FERRITIN: Ferritin: 383 ng/mL — ABNORMAL HIGH (ref 11–307)

## 2019-04-21 LAB — IRON AND TIBC
Iron: 69 ug/dL (ref 41–142)
Saturation Ratios: 19 % — ABNORMAL LOW (ref 21–57)
TIBC: 360 ug/dL (ref 236–444)
UIBC: 291 ug/dL (ref 120–384)

## 2019-04-22 ENCOUNTER — Encounter: Payer: Self-pay | Admitting: Hematology

## 2019-06-21 ENCOUNTER — Inpatient Hospital Stay: Payer: Managed Care, Other (non HMO)

## 2019-06-21 ENCOUNTER — Inpatient Hospital Stay: Payer: Managed Care, Other (non HMO) | Attending: Hematology | Admitting: Hematology

## 2020-08-14 ENCOUNTER — Ambulatory Visit (INDEPENDENT_AMBULATORY_CARE_PROVIDER_SITE_OTHER): Payer: 59

## 2020-08-14 ENCOUNTER — Encounter (HOSPITAL_COMMUNITY): Payer: Self-pay

## 2020-08-14 ENCOUNTER — Ambulatory Visit (HOSPITAL_COMMUNITY)
Admission: EM | Admit: 2020-08-14 | Discharge: 2020-08-14 | Disposition: A | Discharge status: Home / Self Care | Payer: Self-pay | Attending: Family Medicine | Admitting: Family Medicine

## 2020-08-14 DIAGNOSIS — R0789 Other chest pain: Secondary | ICD-10-CM

## 2020-08-14 DIAGNOSIS — R079 Chest pain, unspecified: Secondary | ICD-10-CM

## 2020-08-14 NOTE — Discharge Instructions (Addendum)
You have been seen at the [facility] today for chest pain. Your evaluation today was not suggestive of any emergent condition requiring medical intervention at this time. Your ECG (heart tracing) and chest x-ray did not show any worrisome changes. However, some medical problems make take more time to appear. Therefore, it's very important that you pay attention to any new symptoms or worsening of your current condition.  Please proceed directly to the Emergency Department immediately should you feel worse in any way or have any of the following symptoms: increasing or different chest pain, pain that spreads to your arm, neck, jaw, back or abdomen, shortness of breath, or nausea and vomiting.  

## 2020-08-14 NOTE — ED Triage Notes (Signed)
Pt presents with chest tightness today for the past 2 hours from unknown source.

## 2020-08-17 NOTE — ED Provider Notes (Signed)
[place]   [id number] 08/14/20 Arrival Time: 1907  ASSESSMENT & PLAN:  1. Feeling of chest tightness     She describes this as chest wall pain; worse with deep breath and certain movements. Patient history and exam consistent with non-cardiac cause of chest pain. Conservative measures indicated.  ECG: Performed today and interpreted by me: normal EKG, normal sinus rhythm; no STEMI.  I have personally viewed the imaging studies ordered this visit.    Discharge Instructions     You have been seen at the [facility] today for chest pain. Your evaluation today was not suggestive of any emergent condition requiring medical intervention at this time. Your ECG (heart tracing) and chest x-ray did not show any worrisome changes. However, some medical problems make take more time to appear. Therefore, it's very important that you pay attention to any new symptoms or worsening of your current condition.  Please proceed directly to the Emergency Department immediately should you feel worse in any way or have any of the following symptoms: increasing or different chest pain, pain that spreads to your arm, neck, jaw, back or abdomen, shortness of breath, or nausea and vomiting.      Chest pain precautions given. Reviewed expectations re: course of current medical issues. Questions answered. Outlined signs and symptoms indicating need for more acute intervention. Patient verbalized understanding. After Visit Summary given.   SUBJECTIVE:  History from: patient. Tammy Garrison is a 30 y.o. female who presents with complaint of "chest tightness"; noted today; none currently. No assoc n/v/diaphoresis/SOB reported. No specific aggravating or alleviating factors reported. No LE edema. "Feel ok now".  Illicit drug use: none.  Social History   Tobacco Use  Smoking Status Former Smoker  . Packs/day: 0.25  . Years: 7.00  . Pack years: 1.75  . Types:  Cigarettes  . Quit date: 02/17/2019  . Years since quitting: 1.4  Smokeless Tobacco Never Used   Social History   Substance and Sexual Activity  Alcohol Use Not Currently   Comment: social    OBJECTIVE:  Vitals:   08/14/20 1918  BP: 132/89  Pulse: 89  Resp: 20  Temp: 98.4 F (36.9 C)  TempSrc: Oral  SpO2: 100%    General appearance: alert, oriented, no acute distress Eyes: PERRLA; EOMI; conjunctivae normal HENT: normocephalic; atraumatic Neck: supple with FROM Lungs: without labored respirations; speaks full sentences without difficulty; CTAB Heart: regular rate and rhythm without murmer Chest Wall: mild tenderness to palpation over upper chest Abdomen: soft, non-tender; no guarding or rebound tenderness Extremities: without edema; without calf swelling or tenderness; symmetrical without gross deformities Skin: warm and dry; without rash or lesions Neuro: normal gait Psychological: alert and cooperative; normal mood and affect   Allergies  Allergen Reactions  . Coconut Flavor Itching and Rash    Itchy throat, rash,itching     Past Medical History:  Diagnosis Date  . Arthritis   . Bipolar 1 disorder ([place])   . ETOH abuse   . H/O self-harm   . H/O suicide attempt    x 5 - last 05/2016 - overdose Ibuprofen  . Marijuana abuse    Social History   Socioeconomic History  . Marital status: Single    Spouse name: Not on file  . Number of children: Not on file  . Years of education: Not on file  . Highest education level: Not on file  Occupational History  . Occupation: call center   Tobacco Use  .  Smoking status: Former Smoker    Packs/day: 0.25    Years: 7.00    Pack years: 1.75    Types: Cigarettes    Quit date: 02/17/2019    Years since quitting: 1.4  . Smokeless tobacco: Never Used  Vaping Use  . Vaping Use: Former  Substance and Sexual Activity  . Alcohol use: Not Currently    Comment: social  . Drug use: Not Currently    Types: Marijuana  .  Sexual activity: Yes    Birth control/protection: None  Other Topics Concern  . Not on file  Social History Narrative  . Not on file   Social Determinants of Health   Financial Resource Strain: Not on file  Food Insecurity: Not on file  Transportation Needs: Not on file  Physical Activity: Not on file  Stress: Not on file  Social Connections: Not on file  Intimate Partner Violence: Not on file   Family History  Problem Relation Age of Onset  . Hypertension Other   . Lupus Mother    History reviewed. No pertinent surgical history.   Vanessa Kick, MD 08/17/20 1021

## 2021-05-09 ENCOUNTER — Encounter: Payer: Self-pay | Admitting: Hematology

## 2021-05-09 ENCOUNTER — Ambulatory Visit (HOSPITAL_COMMUNITY): Payer: Self-pay

## 2021-12-16 ENCOUNTER — Encounter (HOSPITAL_COMMUNITY): Payer: Self-pay

## 2021-12-16 ENCOUNTER — Encounter: Payer: Self-pay | Admitting: Hematology

## 2021-12-16 ENCOUNTER — Ambulatory Visit (HOSPITAL_COMMUNITY)
Admission: RE | Admit: 2021-12-16 | Discharge: 2021-12-16 | Disposition: A | Discharge status: Home / Self Care | Payer: BC Managed Care – PPO | Source: Ambulatory Visit | Attending: Family Medicine | Admitting: Family Medicine

## 2021-12-16 VITALS — BP 110/82 | HR 100 | Temp 99.0°F | Resp 16 | Ht 72.0 in | Wt 273.8 lb

## 2021-12-16 DIAGNOSIS — K649 Unspecified hemorrhoids: Secondary | ICD-10-CM | POA: Diagnosis present

## 2021-12-16 DIAGNOSIS — Z113 Encounter for screening for infections with a predominantly sexual mode of transmission: Secondary | ICD-10-CM | POA: Insufficient documentation

## 2021-12-16 MED ORDER — IBUPROFEN 800 MG PO TABS: 800.0000 mg | ORAL_TABLET | Freq: Three times a day (TID) | ORAL | 0 refills | Status: DC | PRN

## 2021-12-16 MED ORDER — HYDROCORTISONE (PERIANAL) 2.5 % EX CREA
1.0000 | TOPICAL_CREAM | Freq: Two times a day (BID) | CUTANEOUS | 1 refills | Status: DC
Start: 2021-12-16 — End: 2022-02-09

## 2021-12-16 NOTE — Discharge Instructions (Addendum)
Avoid constipation ? ?Apply the anusol hc cream to the spot. ? ?Take ibuprofen 800 mg--1 tab every 8 hours as needed for pain.  ? ? ?

## 2021-12-16 NOTE — ED Provider Notes (Signed)
?[facility] ? ? ? ?CSN: [id number] ?Arrival date & time: 12/16/21  1731 ? ? ?  ? ?History   ?Chief Complaint ?Chief Complaint  ?Patient presents with  ? Hemorrhoids  ?  Raw hemorrhoids - Entered by patient  ? ? ?HPI ?Tammy [place] is a 31 y.o. female.  ? ?HPI ?Here for pain and a swelling at her rectum.  ? ?About a month ago she had had some constipation due to a medication, but it is now better. Not having to strain. Some blood and some pain when wipes.  ? ?No f/c. ? ?Requests STI testing too ? ?Past Medical History:  ?Diagnosis Date  ? Arthritis   ? Bipolar 1 disorder ([place])   ? ETOH abuse   ? H/O self-harm   ? H/O suicide attempt   ? x 5 - last 05/2016 - overdose Ibuprofen  ? Marijuana abuse   ? ? ?Patient Active Problem List  ? Diagnosis Date Noted  ? 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 Depression 05/20/2016  ? Marijuana dependence ([place]) 05/19/2016  ? ? ?History reviewed. No pertinent surgical history. ? ?OB History   ?No obstetric history on file. ?  ? ? ? ?Home Medications   ? ?Prior to Admission medications   ?Medication Sig Start Date End Date Taking? Authorizing Provider  ?hydrocortisone (ANUSOL-HC) 2.5 % rectal cream Place 1 application. rectally 2 (two) times daily. As needed for hemorrhoid pain 12/16/21  Yes Barrett Henle, MD  ?ibuprofen (ADVIL) 800 MG tablet Take 1 tablet (800 mg total) by mouth every 8 (eight) hours as needed (pain). 12/16/21  Yes Barrett Henle, MD  ?acetaminophen (TYLENOL) 650 MG CR tablet Take 1,300 mg by mouth every 8 (eight) hours as needed for pain.    [provider]  ?Ascorbic Acid (VITAMIN C PO) Take 1 tablet by mouth daily.    [provider]  ?folic acid (FOLVITE) 1 MG tablet Take 1 mg by mouth daily. 01/24/18   [provider]  ?methotrexate 2.5 MG tablet Take 4 tablets by mouth once a week. 02/24/19   [provider]  ?nicotine (NICODERM CQ - DOSED IN MG/24 HOURS) 21 mg/24hr  patch Place 1 patch (21 mg total) onto the skin daily. ?Patient not taking: Reported on 04/12/2018 05/22/16   Benjamine Mola, FNP  ?predniSONE (DELTASONE) 10 MG tablet Take 1 tablet by mouth 2 (two) times daily. 03/15/19   [provider]  ?thiamine 100 MG tablet Take 1 tablet (100 mg total) by mouth daily. ?Patient not taking: Reported on 04/12/2018 05/22/16   Benjamine Mola, FNP  ?trolamine salicylate (ASPERCREME) 10 % cream Apply 1 application topically as needed for muscle pain.    [provider]  ?XELJANZ XR 11 MG TB24 Not started yet 03/10/19   [provider]  ?zolpidem (AMBIEN) 5 MG tablet Take 1 tablet (5 mg total) by mouth at bedtime as needed for sleep. ?Patient not taking: Reported on 04/12/2018 05/21/16   Benjamine Mola, FNP  ? ? ?Family History ?Family History  ?Problem Relation Age of Onset  ? Hypertension Other   ? Lupus Mother   ? ? ?Social History ?Social History  ? ?Tobacco Use  ? Smoking status: Former  ?  Packs/day: 0.25  ?  Years: 7.00  ?  Pack years: 1.75  ?  Types: Cigarettes  ?  Quit date: 02/17/2019  ?  Years since quitting: 2.8  ? Smokeless  tobacco: Never  ?Vaping Use  ? Vaping Use: Former  ?Substance Use Topics  ? Alcohol use: Not Currently  ?  Comment: social  ? Drug use: Not Currently  ?  Types: Marijuana  ? ? ? ?Allergies   ?Coconut flavor ? ? ?Review of Systems ?Review of Systems ? ? ?Physical Exam ?Triage Vital Signs ?ED Triage Vitals  ?Enc Vitals Group  ?   BP 12/16/21 1756 110/82  ?   Pulse Rate 12/16/21 1756 100  ?   Resp 12/16/21 1756 16  ?   Temp 12/16/21 1756 99 ?F (37.2 ?C)  ?   Temp Source 12/16/21 1756 Oral  ?   SpO2 12/16/21 1756 98 %  ?   Weight 12/16/21 1755 273 lb 13 oz (124.2 kg)  ?   Height 12/16/21 1755 6' (1.829 m)  ?   Head Circumference --   ?   Peak Flow --   ?   Pain Score 12/16/21 1755 0  ?   Pain Loc --   ?   Pain Edu? --   ?   Excl. in [place]? --   ? ?No data found. ? ?Updated Vital Signs ?BP 110/82 (BP Location: Right Arm)   Pulse 100    Temp 99 ?F (37.2 ?C) (Oral)   Resp 16   Ht 6' (1.829 m)   Wt 124.2 kg   SpO2 98%   BMI 37.14 kg/m?  ? ?Visual Acuity ?Right Eye Distance:   ?Left Eye Distance:   ?Bilateral Distance:   ? ?Right Eye Near:   ?Left Eye Near:    ?Bilateral Near:    ? ?Physical Exam ?Vitals reviewed.  ?HENT:  ?   Mouth/Throat:  ?   Mouth: Mucous membranes are moist.  ?Cardiovascular:  ?   Rate and Rhythm: Normal rate and regular rhythm.  ?Pulmonary:  ?   Effort: Pulmonary effort is normal.  ?   Breath sounds: Normal breath sounds.  ?Genitourinary: ?   Comments: There is a 2.5 x 1.5 cm pink mass, c/w distended hemorrhoid possibly at the rectum.  ?Neurological:  ?   Mental Status: She is oriented to person, place, and time.  ?Psychiatric:     ?   Behavior: Behavior normal.  ? ? ? ?UC Treatments / Results  ?Labs ?(all labs ordered are listed, but only abnormal results are displayed) ?Labs Reviewed  ?CERVICOVAGINAL ANCILLARY ONLY  ? ? ?EKG ? ? ?Radiology ?No results found. ? ?Procedures ?Procedures (including critical care time) ? ?Medications Ordered in UC ?Medications - No data to display ? ?Initial Impression / Assessment and Plan / UC Course  ?I have reviewed the triage vital signs and the nursing notes. ? ?Pertinent labs & imaging results that were available during my care of the patient were reviewed by me and considered in my medical decision making (see chart for details). ? ?  ? ?Discussed avoiding constipation. Anusol to be applied. Gen surgery contact info given for there evaluation, poss tx. ?Final Clinical Impressions(s) / UC Diagnoses  ? ?Final diagnoses:  ?Screen for STD (sexually transmitted disease)  ? ? ? ?Discharge Instructions   ? ?  ?Avoid constipation ? ?Apply the anusol hc cream to the spot. ? ?Take ibuprofen 800 mg--1 tab every 8 hours as needed for pain.  ? ? ? ? ? ? ?ED Prescriptions   ? ? Medication Sig Dispense Auth. Provider  ? ibuprofen (ADVIL) 800 MG tablet Take 1 tablet (800 mg total) by mouth every  8  (eight) hours as needed (pain). 21 tablet Barrett Henle, MD  ? hydrocortisone (ANUSOL-HC) 2.5 % rectal cream Place 1 application. rectally 2 (two) times daily. As needed for hemorrhoid pain 30 g Windy Carina Gwenlyn Perking, MD  ? ?  ? ?PDMP not reviewed this encounter. ?  ?Barrett Henle, MD ?12/16/21 1821 ? ?

## 2021-12-16 NOTE — ED Triage Notes (Addendum)
Pt reports possible hemorrhoids near rectum for 1 month. States she's only see's blood when wiping and only painful upon palpation.  ?Pt also requesting STD testing. Denies any current symptoms.  ?

## 2021-12-17 ENCOUNTER — Telehealth (HOSPITAL_COMMUNITY): Payer: Self-pay | Admitting: Emergency Medicine

## 2021-12-17 ENCOUNTER — Encounter: Payer: Self-pay | Admitting: Hematology

## 2021-12-17 LAB — CERVICOVAGINAL ANCILLARY ONLY
Bacterial Vaginitis (gardnerella): POSITIVE — AB
Candida Glabrata: NEGATIVE
Candida Vaginitis: POSITIVE — AB
Chlamydia: NEGATIVE
Comment: NEGATIVE
Comment: NEGATIVE
Comment: NEGATIVE
Comment: NEGATIVE
Comment: NEGATIVE
Comment: NORMAL
Neisseria Gonorrhea: NEGATIVE
Trichomonas: POSITIVE — AB

## 2021-12-17 MED ORDER — FLUCONAZOLE 150 MG PO TABS: 150.0000 mg | ORAL_TABLET | Freq: Once | ORAL | 0 refills | Status: AC

## 2021-12-17 MED ORDER — METRONIDAZOLE 500 MG PO TABS: 500.0000 mg | ORAL_TABLET | Freq: Two times a day (BID) | ORAL | 0 refills | Status: DC

## 2022-02-06 ENCOUNTER — Encounter (HOSPITAL_COMMUNITY): Payer: Self-pay

## 2022-02-06 ENCOUNTER — Ambulatory Visit (HOSPITAL_COMMUNITY)
Admission: EM | Admit: 2022-02-06 | Discharge: 2022-02-06 | Disposition: A | Discharge status: Home / Self Care | Payer: BC Managed Care – PPO | Attending: Family Medicine | Admitting: Family Medicine

## 2022-02-06 ENCOUNTER — Other Ambulatory Visit: Payer: Self-pay

## 2022-02-06 ENCOUNTER — Observation Stay (HOSPITAL_COMMUNITY)
Admission: EM | Admit: 2022-02-06 | Discharge: 2022-02-09 | Disposition: A | Discharge status: Home / Self Care | Payer: Self-pay | Attending: Family Medicine | Admitting: Family Medicine

## 2022-02-06 ENCOUNTER — Encounter: Payer: Self-pay | Admitting: Hematology

## 2022-02-06 ENCOUNTER — Encounter (HOSPITAL_COMMUNITY): Payer: Self-pay | Admitting: Emergency Medicine

## 2022-02-06 DIAGNOSIS — K625 Hemorrhage of anus and rectum: Secondary | ICD-10-CM | POA: Diagnosis present

## 2022-02-06 DIAGNOSIS — D649 Anemia, unspecified: Secondary | ICD-10-CM

## 2022-02-06 DIAGNOSIS — E876 Hypokalemia: Secondary | ICD-10-CM | POA: Insufficient documentation

## 2022-02-06 DIAGNOSIS — M069 Rheumatoid arthritis, unspecified: Secondary | ICD-10-CM | POA: Diagnosis present

## 2022-02-06 DIAGNOSIS — R000 Tachycardia, unspecified: Secondary | ICD-10-CM

## 2022-02-06 DIAGNOSIS — Z87891 Personal history of nicotine dependence: Secondary | ICD-10-CM | POA: Insufficient documentation

## 2022-02-06 DIAGNOSIS — K648 Other hemorrhoids: Principal | ICD-10-CM | POA: Insufficient documentation

## 2022-02-06 DIAGNOSIS — C20 Malignant neoplasm of rectum: Secondary | ICD-10-CM | POA: Insufficient documentation

## 2022-02-06 DIAGNOSIS — R55 Syncope and collapse: Secondary | ICD-10-CM | POA: Diagnosis present

## 2022-02-06 DIAGNOSIS — Z79899 Other long term (current) drug therapy: Secondary | ICD-10-CM | POA: Insufficient documentation

## 2022-02-06 DIAGNOSIS — D500 Iron deficiency anemia secondary to blood loss (chronic): Secondary | ICD-10-CM | POA: Diagnosis present

## 2022-02-06 DIAGNOSIS — D62 Acute posthemorrhagic anemia: Secondary | ICD-10-CM | POA: Diagnosis present

## 2022-02-06 HISTORY — DX: Rheumatoid arthritis, unspecified: M06.9

## 2022-02-06 LAB — CBC WITH DIFFERENTIAL/PLATELET
Abs Immature Granulocytes: 0 10*3/uL (ref 0.00–0.07)
Basophils Absolute: 0.1 10*3/uL (ref 0.0–0.1)
Basophils Relative: 1 %
Eosinophils Absolute: 0.1 10*3/uL (ref 0.0–0.5)
Eosinophils Relative: 1 %
HCT: 26 % — ABNORMAL LOW (ref 36.0–46.0)
Hemoglobin: 6.9 g/dL — CL (ref 12.0–15.0)
Lymphocytes Relative: 13 %
Lymphs Abs: 1.1 10*3/uL (ref 0.7–4.0)
MCH: 16.8 pg — ABNORMAL LOW (ref 26.0–34.0)
MCHC: 26.5 g/dL — ABNORMAL LOW (ref 30.0–36.0)
MCV: 63.3 fL — ABNORMAL LOW (ref 80.0–100.0)
Monocytes Absolute: 0 10*3/uL — ABNORMAL LOW (ref 0.1–1.0)
Monocytes Relative: 0 %
Neutro Abs: 7 10*3/uL (ref 1.7–7.7)
Neutrophils Relative %: 85 %
Platelets: 288 10*3/uL (ref 150–400)
RBC: 4.11 MIL/uL (ref 3.87–5.11)
RDW: 21.7 % — ABNORMAL HIGH (ref 11.5–15.5)
WBC: 8.2 10*3/uL (ref 4.0–10.5)
nRBC: 0 % (ref 0.0–0.2)
nRBC: 0 /100 WBC

## 2022-02-06 LAB — ABO/RH: ABO/RH(D): AB POS

## 2022-02-06 LAB — COMPREHENSIVE METABOLIC PANEL
ALT: 13 U/L (ref 0–44)
AST: 16 U/L (ref 15–41)
Albumin: 3.8 g/dL (ref 3.5–5.0)
Alkaline Phosphatase: 54 U/L (ref 38–126)
Anion gap: 9 (ref 5–15)
BUN: 6 mg/dL (ref 6–20)
CO2: 23 mmol/L (ref 22–32)
Calcium: 9.4 mg/dL (ref 8.9–10.3)
Chloride: 104 mmol/L (ref 98–111)
Creatinine, Ser: 0.69 mg/dL (ref 0.44–1.00)
GFR, Estimated: >60 mL/min (ref >60)
Glucose, Bld: 99 mg/dL (ref 70–99)
Potassium: 3.7 mmol/L (ref 3.5–5.1)
Sodium: 136 mmol/L (ref 135–145)
Total Bilirubin: 0.7 mg/dL (ref 0.3–1.2)
Total Protein: 8.1 g/dL (ref 6.5–8.1)

## 2022-02-06 LAB — PREPARE RBC (CROSSMATCH)

## 2022-02-06 LAB — I-STAT BETA HCG BLOOD, ED (MC, WL, AP ONLY): I-stat hCG, quantitative: <5 m[IU]/mL (ref <5)

## 2022-02-06 MED ORDER — LACTATED RINGERS IV BOLUS
2000.0000 mL | Freq: Once | INTRAVENOUS | Status: DC
Start: 2022-02-06 — End: 2022-02-07

## 2022-02-06 MED ORDER — SODIUM CHLORIDE 0.9 % IV SOLN
10.0000 mL/h | Freq: Once | INTRAVENOUS | Status: AC
Start: 2022-02-06 — End: 2022-02-07
  Administered 2022-02-07: 10 mL/h via INTRAVENOUS

## 2022-02-06 MED ORDER — DIPHENHYDRAMINE HCL 25 MG PO CAPS
25.0000 mg | ORAL_CAPSULE | Freq: Once | ORAL | Status: AC
Start: 2022-02-06 — End: 2022-02-07
  Administered 2022-02-07: 25 mg via ORAL
  Filled 2022-02-06: qty 1

## 2022-02-06 NOTE — ED Triage Notes (Signed)
Pt c/o fatigue, syncope and bleeding from a hemorrhoid x 2 days. Pt appears pale.

## 2022-02-06 NOTE — ED Triage Notes (Signed)
Patient states she has bleed hemorrhoids for the last 2 weeks. Patient states earlier today she was passing blood clots when she used the bathroom.   Patient has been weak, states she feels like she is going to pass out while trying to take showers. Patient states a few days ago she was in the shower and she passed out, states everything sounded far away and next thing she knows she woke up on the ground. Unknown if Patient hit her head.

## 2022-02-06 NOTE — Discharge Instructions (Addendum)
Please proceed to the emergency room for urgent evaluation and treatment

## 2022-02-06 NOTE — Assessment & Plan Note (Signed)
Due to anemia + LGIB.

## 2022-02-06 NOTE — Assessment & Plan Note (Deleted)
Pt states hemorrhoids, but bleed sounds quite brisk for this. 1.

## 2022-02-06 NOTE — ED Provider Notes (Signed)
[facility]    CSN: [id number] Arrival date & time: 02/06/22  1943      History   Chief Complaint Chief Complaint  Patient presents with   Hemorrhoids   Fatigue    HPI [name] is a 31 y.o. female.   HPI Here for syncope and bleeding.  She was seen here in mid May for bleeding and other issues.  Her rectal bleeding did improve after that, and then she says it started again about 2 weeks ago.  Now in the last week its been very brisk and in the last 2 days she has noted clots and very heavy bleeding through her rectum.  Yesterday she passed out.  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 No pertinent surgical history.  OB History   No obstetric history on file.      Home Medications    Prior to Admission medications   Medication Sig Start Date End Date Taking? Authorizing Provider  acetaminophen (TYLENOL) 650 MG CR tablet Take 1,300 mg by mouth every 8 (eight) hours as needed for pain.    [provider]  Ascorbic Acid (VITAMIN C PO) Take 1 tablet by mouth daily.    [provider]  folic acid (FOLVITE) 1 MG tablet Take 1 mg by mouth daily. 01/24/18   [provider]  hydrocortisone (ANUSOL-HC) 2.5 % rectal cream Place 1 application. rectally 2 (two) times daily. As needed for hemorrhoid pain 12/16/21   Barrett Henle, MD  methotrexate 2.5 MG tablet Take 4 tablets by mouth once a week. 02/24/19   [provider]  trolamine salicylate (ASPERCREME) 10 % cream Apply 1 application topically  as needed for muscle pain.    [provider]  XELJANZ XR 11 MG TB24 Not started yet 03/10/19   [provider]    Family History Family History  Problem Relation Age of Onset   Hypertension Other    Lupus Mother     Social History Social History   Tobacco Use   Smoking status: Former    Packs/day: 0.25    Years: 7.00    Total pack years: 1.75    Types: Cigarettes    Quit date: 02/17/2019    Years since quitting: 2.9   Smokeless tobacco: Never  Vaping Use   Vaping Use: Former  Substance Use Topics   Alcohol use: Not Currently    Comment: social   Drug use: Not Currently    Types: Marijuana     Allergies   Coconut flavor   Review of Systems Review of Systems   Physical Exam Triage Vital Signs ED Triage Vitals  Enc Vitals Group     BP 02/06/22 1954 125/79     Pulse Rate 02/06/22 1954 (!) 115     Resp 02/06/22 1954 16     Temp 02/06/22 1954 98.6 F (37 C)     Temp Source 02/06/22 1954 Oral     SpO2  02/06/22 1954 98 %     Weight 02/06/22 1957 273 lb 13 oz (124.2 kg)     Height 02/06/22 1957 6' (1.829 m)     Head Circumference --      Peak Flow --      Pain Score 02/06/22 1957 8     Pain Loc --      Pain Edu? --      Excl. in [place]? --    No data found.  Updated Vital Signs BP 125/79 (BP Location: Left Arm)   Pulse (!) 115   Temp 98.6 F (37 C) (Oral)   Resp 16   Ht 6' (1.829 m)   Wt 124.2 kg   LMP 01/27/2022 (Approximate)   SpO2 98%   BMI 37.14 kg/m   Visual Acuity Right Eye Distance:   Left Eye Distance:   Bilateral Distance:    Right Eye Near:   Left Eye Near:    Bilateral Near:     Physical Exam Vitals reviewed.  Constitutional:      General: She is not in acute distress.    Appearance: She is not toxic-appearing.  HENT:     Mouth/Throat:     Mouth: Mucous membranes are moist.  Eyes:     Extraocular Movements: Extraocular movements intact.     Pupils: Pupils are equal, round, and reactive to light.   Cardiovascular:     Rate and Rhythm: Normal rate and regular rhythm.     Heart sounds: No murmur heard. Pulmonary:     Effort: Pulmonary effort is normal.     Breath sounds: Normal breath sounds.  Musculoskeletal:     Cervical back: Neck supple.  Lymphadenopathy:     Cervical: No cervical adenopathy.  Skin:    Capillary Refill: Capillary refill takes less than 2 seconds.  Neurological:     Mental Status: She is alert and oriented to person, place, and time.  Psychiatric:        Behavior: Behavior normal.      UC Treatments / Results  Labs (all labs ordered are listed, but only abnormal results are displayed) Labs Reviewed - No data to display  EKG   Radiology No results found.  Procedures Procedures (including critical care time)  Medications Ordered in UC Medications - No data to display  Initial Impression / Assessment and Plan / UC Course  I have reviewed the triage vital signs and the nursing notes.  Pertinent labs & imaging results that were available during my care of the patient were reviewed by me and considered in my medical decision making (see chart for details).     With her history and tachycardia, I think she is at least dehydrated if not anemic.  I have asked her to proceed to the emergency room for further evaluation and treatment.  Her mom is driving her there. Final Clinical Impressions(s) / UC Diagnoses   Final diagnoses:  Rectal bleeding  Syncope, unspecified syncope type  Tachycardia     Discharge Instructions      Please proceed to the emergency room for urgent evaluation and treatment   ED Prescriptions   None    PDMP not reviewed this encounter.   Barrett Henle, MD 02/06/22 2011

## 2022-02-06 NOTE — ED Provider Triage Note (Signed)
Emergency Medicine Provider Triage Evaluation Note  Tammy [place] , a 31 y.o. female  was evaluated in triage.  Pt complains of active bleeding.  History of the same.  Has been going on for at least 1 month.  She passed out today.  She was seen at the urgent care and sent in for further evaluation.  She complains of heavy bleeding and passage of clots from her bottom.  Review of Systems  Positive: Rectal bleeding Negative: Vaginal complaints  Physical Exam  BP (!) 123/102   Pulse (!) 104   Temp 98.5 F (36.9 C) (Oral)   Resp 18   LMP 01/27/2022 (Approximate)   SpO2 99%  Gen:   Awake, no distress   Resp:  Normal effort  MSK:   Moves extremities without difficulty  Other:  Appears pale  Medical Decision Making  Medically screening exam initiated at 8:26 PM.  Appropriate orders placed.  Tammy [place] was informed that the remainder of the evaluation will be completed by another provider, this initial triage assessment does not replace that evaluation, and the importance of remaining in the ED until their evaluation is complete.  Work-up initiated   Margarita Mail, PA-C 02/06/22 2027

## 2022-02-06 NOTE — ED Notes (Signed)
Patient is being discharged from the Urgent Care and sent to the Emergency Department via personal vehicle. Per provider, patient is in need of higher level of care due to syncope episode and rectal bleeding. Patient is aware and verbalizes understanding of plan of care.  Vitals:   02/06/22 1954  BP: 125/79  Pulse: (!) 115  Resp: 16  Temp: 98.6 F (37 C)  SpO2: 98%

## 2022-02-07 ENCOUNTER — Encounter (HOSPITAL_COMMUNITY): Payer: Self-pay | Admitting: Internal Medicine

## 2022-02-07 DIAGNOSIS — K648 Other hemorrhoids: Secondary | ICD-10-CM

## 2022-02-07 DIAGNOSIS — D500 Iron deficiency anemia secondary to blood loss (chronic): Secondary | ICD-10-CM

## 2022-02-07 DIAGNOSIS — K625 Hemorrhage of anus and rectum: Secondary | ICD-10-CM

## 2022-02-07 DIAGNOSIS — D649 Anemia, unspecified: Secondary | ICD-10-CM

## 2022-02-07 DIAGNOSIS — R55 Syncope and collapse: Secondary | ICD-10-CM

## 2022-02-07 DIAGNOSIS — D62 Acute posthemorrhagic anemia: Secondary | ICD-10-CM

## 2022-02-07 DIAGNOSIS — M069 Rheumatoid arthritis, unspecified: Secondary | ICD-10-CM

## 2022-02-07 LAB — CBC
HCT: 28.5 % — ABNORMAL LOW (ref 36.0–46.0)
Hemoglobin: 7.8 g/dL — ABNORMAL LOW (ref 12.0–15.0)
MCH: 18 pg — ABNORMAL LOW (ref 26.0–34.0)
MCHC: 27.4 g/dL — ABNORMAL LOW (ref 30.0–36.0)
MCV: 65.8 fL — ABNORMAL LOW (ref 80.0–100.0)
Platelets: 264 10*3/uL (ref 150–400)
RBC: 4.33 MIL/uL (ref 3.87–5.11)
RDW: 24.2 % — ABNORMAL HIGH (ref 11.5–15.5)
WBC: 8.7 10*3/uL (ref 4.0–10.5)
nRBC: 0 % (ref 0.0–0.2)

## 2022-02-07 LAB — MRSA NEXT GEN BY PCR, NASAL: MRSA by PCR Next Gen: NOT DETECTED

## 2022-02-07 LAB — BASIC METABOLIC PANEL
Anion gap: 7 (ref 5–15)
BUN: 7 mg/dL (ref 6–20)
CO2: 23 mmol/L (ref 22–32)
Calcium: 9.1 mg/dL (ref 8.9–10.3)
Chloride: 106 mmol/L (ref 98–111)
Creatinine, Ser: 0.72 mg/dL (ref 0.44–1.00)
GFR, Estimated: >60 mL/min (ref >60)
Glucose, Bld: 118 mg/dL — ABNORMAL HIGH (ref 70–99)
Potassium: 3.1 mmol/L — ABNORMAL LOW (ref 3.5–5.1)
Sodium: 136 mmol/L (ref 135–145)

## 2022-02-07 LAB — HIV ANTIBODY (ROUTINE TESTING W REFLEX): HIV Screen 4th Generation wRfx: NONREACTIVE

## 2022-02-07 MED ORDER — ACETAMINOPHEN 650 MG RE SUPP: 650.0000 mg | Freq: Four times a day (QID) | RECTAL | Status: DC | PRN

## 2022-02-07 MED ORDER — PEG-KCL-NACL-NASULF-NA ASC-C 100 G PO SOLR: 0.5000 | Freq: Once | ORAL | Status: DC

## 2022-02-07 MED ORDER — HYDROCODONE-ACETAMINOPHEN 5-325 MG PO TABS
2.0000 | ORAL_TABLET | Freq: Once | ORAL | Status: AC
Start: 2022-02-07 — End: 2022-02-07
  Administered 2022-02-07: 2 via ORAL
  Filled 2022-02-07: qty 2

## 2022-02-07 MED ORDER — FAMOTIDINE 20 MG PO TABS
10.0000 mg | ORAL_TABLET | Freq: Two times a day (BID) | ORAL | Status: DC
  Administered 2022-02-07 – 2022-02-09 (×5): 10 mg via ORAL
  Filled 2022-02-07 (×5): qty 1

## 2022-02-07 MED ORDER — HYDROCORTISONE (PERIANAL) 2.5 % EX CREA
1.0000 | TOPICAL_CREAM | Freq: Two times a day (BID) | CUTANEOUS | Status: DC
Start: 2022-02-07 — End: 2022-02-09
  Administered 2022-02-07: 1 via RECTAL
  Filled 2022-02-07 (×2): qty 28.35

## 2022-02-07 MED ORDER — PEG-KCL-NACL-NASULF-NA ASC-C 100 G PO SOLR: 1.0000 | Freq: Once | ORAL | Status: DC

## 2022-02-07 MED ORDER — SODIUM CHLORIDE 0.9 % IV BOLUS
2000.0000 mL | Freq: Once | INTRAVENOUS | Status: AC
  Administered 2022-02-07: 2000 mL via INTRAVENOUS

## 2022-02-07 MED ORDER — ONDANSETRON HCL 4 MG PO TABS: 4.0000 mg | ORAL_TABLET | Freq: Four times a day (QID) | ORAL | Status: DC | PRN

## 2022-02-07 MED ORDER — ONDANSETRON HCL 4 MG/2ML IJ SOLN
4.0000 mg | Freq: Four times a day (QID) | INTRAMUSCULAR | Status: DC | PRN
  Administered 2022-02-08: 4 mg via INTRAVENOUS
  Filled 2022-02-07: qty 2

## 2022-02-07 MED ORDER — POTASSIUM CHLORIDE CRYS ER 20 MEQ PO TBCR
40.0000 meq | EXTENDED_RELEASE_TABLET | ORAL | Status: AC
  Administered 2022-02-07 (×2): 40 meq via ORAL
  Filled 2022-02-07 (×2): qty 2

## 2022-02-07 MED ORDER — ACETAMINOPHEN 325 MG PO TABS
650.0000 mg | ORAL_TABLET | Freq: Four times a day (QID) | ORAL | Status: DC | PRN
  Administered 2022-02-07 – 2022-02-09 (×5): 650 mg via ORAL
  Filled 2022-02-07 (×5): qty 2

## 2022-02-07 MED ORDER — METOCLOPRAMIDE HCL 5 MG/ML IJ SOLN
10.0000 mg | Freq: Once | INTRAMUSCULAR | Status: DC
Start: 2022-02-07 — End: 2022-02-07

## 2022-02-07 MED ORDER — SODIUM CHLORIDE 0.9 % IV SOLN
2.0000 g | Freq: Once | INTRAVENOUS | Status: AC
  Administered 2022-02-08: 2 g via INTRAVENOUS
  Filled 2022-02-07: qty 2

## 2022-02-07 MED ORDER — MELATONIN 5 MG PO TABS
5.0000 mg | ORAL_TABLET | Freq: Every evening | ORAL | Status: DC | PRN
  Administered 2022-02-08 (×2): 5 mg via ORAL
  Filled 2022-02-07 (×2): qty 1

## 2022-02-07 NOTE — Consult Note (Signed)
Reason for Consult:hemorrhoid with anemia Referring Physician: Silvano Rusk  Tammy [place] is an 31 y.o. female.  HPI: 31yo F with PMHx iron deficiency anemia, [place] 1 and RA presented to the ED C/O LOC. She was found to be anemic. She was previously seen by Dr. Burr Medico for anemia and had significant menorrhagia at that time. She reports recently she developed a hemorrhoid and had rectal bleeding and pain. She was seen at an urgent care in May for this. She has been using topical hydrocortisone without much improvement. She also reports ongoing heavy periods.  Past Medical History:  Diagnosis Date   Bipolar 1 disorder ([place])    ETOH abuse    H/O self-harm    H/O suicide attempt    x 5 - last 05/2016 - overdose Ibuprofen   Marijuana abuse    Rheumatoid arthritis ([place])     History reviewed. No pertinent surgical history.  Family History  Problem Relation Age of Onset   Hypertension Other    Lupus Mother     Social History:  reports that she quit smoking about 2 years ago. Her smoking use included cigarettes. She has a 1.75 pack-year smoking history. She has never used smokeless tobacco. She reports that she does not currently use alcohol. She reports that she does not currently use drugs after having used the following drugs: Marijuana.  Allergies:  Allergies  Allergen Reactions   Coconut Flavor Itching and Rash    Itchy throat, rash,itching     Medications: I have reviewed the patient's current medications.  Results for orders placed or performed during the hospital encounter of 02/06/22 (from the past 48 hour(s))  Type and screen [place]     Status: None (Preliminary result)   Collection Time: 02/06/22  8:50 PM  Result Value Ref Range   ABO/RH(D) AB POS    Antibody Screen NEG    Sample Expiration 02/09/2022,2359    Unit Number [id number]    Blood Component Type RED CELLS,LR    Unit division 00    Status of Unit REL FROM [facility]     Transfusion Status OK TO TRANSFUSE    Crossmatch Result Compatible    Unit Number [id number]    Blood Component Type RED CELLS,LR    Unit division 00    Status of Unit ISSUED    Transfusion Status OK TO TRANSFUSE    Crossmatch Result      Compatible Performed at [facility], [address], [place], [place] [address]   CBC with Differential     Status: Abnormal   Collection Time: 02/06/22  8:51 PM  Result Value Ref Range   WBC 8.2 4.0 - 10.5 K/uL   RBC 4.11 3.87 - 5.11 MIL/uL   Hemoglobin 6.9 (LL) 12.0 - 15.0 g/dL    Comment: REPEATED TO VERIFY Reticulocyte Hemoglobin testing may be clinically indicated, consider ordering this additional test [id number] THIS CRITICAL RESULT HAS VERIFIED AND BEEN CALLED TO CALI STRAUGHN RN BY RAY CUENCA ON 07 07 2023 AT 2139, AND HAS BEEN READ BACK.     HCT 26.0 (L) 36.0 - 46.0 %   MCV 63.3 (L) 80.0 - 100.0 fL   MCH 16.8 (L) 26.0 - 34.0 pg   MCHC 26.5 (L) 30.0 - 36.0 g/dL   RDW 21.7 (H) 11.5 - 15.5 %   Platelets 288 150 - 400 K/uL    Comment: REPEATED TO VERIFY   nRBC 0.0 0.0 - 0.2 %  Neutrophils Relative % 85 %   Neutro Abs 7.0 1.7 - 7.7 K/uL   Lymphocytes Relative 13 %   Lymphs Abs 1.1 0.7 - 4.0 K/uL   Monocytes Relative 0 %   Monocytes Absolute 0.0 (L) 0.1 - 1.0 K/uL   Eosinophils Relative 1 %   Eosinophils Absolute 0.1 0.0 - 0.5 K/uL   Basophils Relative 1 %   Basophils Absolute 0.1 0.0 - 0.1 K/uL   nRBC 0 0 /100 WBC   Abs Immature Granulocytes 0.00 0.00 - 0.07 K/uL    Comment: Performed at [facility] [address], [place], [place] [address]  Comprehensive metabolic panel     Status: None   Collection Time: 02/06/22  8:51 PM  Result Value Ref Range   Sodium 136 135 - 145 mmol/L   Potassium 3.7 3.5 - 5.1 mmol/L   Chloride 104 98 - 111 mmol/L   CO2 23 22 - 32 mmol/L   Glucose, Bld 99 70 - 99 mg/dL    Comment: Glucose reference range applies only to samples taken after fasting for at least 8 hours.   BUN 6 6 -  20 mg/dL   Creatinine, Ser 0.69 0.44 - 1.00 mg/dL   Calcium 9.4 8.9 - 10.3 mg/dL   Total Protein 8.1 6.5 - 8.1 g/dL   Albumin 3.8 3.5 - 5.0 g/dL   AST 16 15 - 41 U/L   ALT 13 0 - 44 U/L   Alkaline Phosphatase 54 38 - 126 U/L   Total Bilirubin 0.7 0.3 - 1.2 mg/dL   GFR, Estimated >60 >60 mL/min    Comment: (NOTE) Calculated using the CKD-EPI Creatinine Equation (2021)    Anion gap 9 5 - 15    Comment: Performed at [facility] [address], [place], [place] [address]  ABO/Rh     Status: None   Collection Time: 02/06/22  9:02 PM  Result Value Ref Range   ABO/RH(D)      AB POS Performed at [facility] [address], [place], [place] [address]   I-Stat beta hCG blood, ED     Status: None   Collection Time: 02/06/22 10:47 PM  Result Value Ref Range   I-stat hCG, quantitative <5.0 <5 mIU/mL   Comment 3            Comment:   GEST. AGE      CONC.  (mIU/mL)   <=1 WEEK        5 - 50     2 WEEKS       50 - 500     3 WEEKS       100 - 10,000     4 WEEKS     1,000 - 30,000        FEMALE AND NON-PREGNANT FEMALE:     LESS THAN 5 mIU/mL   Prepare RBC (crossmatch)     Status: None   Collection Time: 02/06/22 11:41 PM  Result Value Ref Range   Order Confirmation      ORDER PROCESSED BY BLOOD BANK Performed at [facility], [place] [address], [place], [place] [address]   HIV Antibody (routine testing w rflx)     Status: None   Collection Time: 02/07/22  3:44 AM  Result Value Ref Range   HIV Screen 4th Generation wRfx Non Reactive Non Reactive    Comment: Performed at [facility], [place] [address] [place], [place] [address]  CBC     Status: Abnormal   Collection Time: 02/07/22  3:44 AM  Result Value Ref Range   WBC 8.7 4.0 - 10.5 K/uL   RBC 4.33 3.87 - 5.11 MIL/uL   Hemoglobin 7.8 (L) 12.0 - 15.0 g/dL    Comment: Reticulocyte Hemoglobin testing may be clinically indicated, consider ordering this additional test [id number]    HCT 28.5 (L) 36.0 - 46.0 %    MCV 65.8 (L) 80.0 - 100.0 fL   MCH 18.0 (L) 26.0 - 34.0 pg   MCHC 27.4 (L) 30.0 - 36.0 g/dL   RDW 24.2 (H) 11.5 - 15.5 %   Platelets 264 150 - 400 K/uL    Comment: REPEATED TO VERIFY   nRBC 0.0 0.0 - 0.2 %    Comment: Performed at [facility], [place] [address], [place], [place] [address]  Basic metabolic panel     Status: Abnormal   Collection Time: 02/07/22  3:44 AM  Result Value Ref Range   Sodium 136 135 - 145 mmol/L   Potassium 3.1 (L) 3.5 - 5.1 mmol/L   Chloride 106 98 - 111 mmol/L   CO2 23 22 - 32 mmol/L   Glucose, Bld 118 (H) 70 - 99 mg/dL    Comment: Glucose reference range applies only to samples taken after fasting for at least 8 hours.   BUN 7 6 - 20 mg/dL   Creatinine, Ser 0.72 0.44 - 1.00 mg/dL   Calcium 9.1 8.9 - 10.3 mg/dL   GFR, Estimated >60 >60 mL/min    Comment: (NOTE) Calculated using the CKD-EPI Creatinine Equation (2021)    Anion gap 7 5 - 15    Comment: Performed at [facility] [address] [place], [place] [address]    No results found.  Review of Systems  Constitutional:  Positive for activity change.  HENT: Negative.    Eyes: Negative.   Respiratory: Negative.    Cardiovascular: Negative.   Gastrointestinal:  Positive for anal bleeding, blood in stool and rectal pain. Negative for abdominal pain, constipation and diarrhea.  Endocrine: Negative.   Genitourinary: Negative.   Musculoskeletal: Negative.   Skin: Negative.   Allergic/Immunologic: Negative.   Neurological: Negative.   Hematological: Negative.   Psychiatric/Behavioral: Negative.     Blood pressure 116/64, pulse 77, temperature 98.8 F (37.1 C), resp. rate 17, last menstrual period 01/27/2022, SpO2 100 %. Physical Exam Cardiovascular:     Rate and Rhythm: Normal rate and regular rhythm.  Pulmonary:     Effort: Pulmonary effort is normal.     Breath sounds: Normal breath sounds.  Abdominal:     General: Abdomen is flat. There is no distension.     Palpations:  Abdomen is soft.     Tenderness: There is no abdominal tenderness. There is no guarding or rebound.  Genitourinary:    Comments: External anal exam reveals grade 4 external hemorrhoid, quite tender, not actively bleeding, pain precludes further exam Skin:    General: Skin is warm.  Neurological:     Mental Status: She is alert and oriented to person, place, and time.  Psychiatric:        Mood and Affect: Mood normal.     Assessment/Plan: Symptomatic anemia - agree with [facility] admission and management Grade 4 external hemorrhoid - this has failed topical medications and is a significant contributor to her anemia.  I have offered examination under anesthesia, external hemorrhoidectomy, possible internal hemorrhoidectomy.  I discussed the procedure,  risks, and benefits.  I also discussed the expected postoperative course.  We will plan to do this tomorrow morning based on OR availability.  She is agreeable.  N.p.o. after midnight.   Zenovia Jarred 02/07/2022, 12:25 PM

## 2022-02-07 NOTE — ED Provider Notes (Signed)
[facility] EMERGENCY DEPARTMENT Provider Note   CSN: [id number] Arrival date & time: 02/06/22  2012     History  Chief Complaint  Patient presents with   Loss of Consciousness    Tammy Garrison is a 31 y.o. female.  31 year old female presents today for evaluation of rectal bleeding which she believes is secondary to her hemorrhoid.  Reports has been heavy over the past week but has worsened over the past 2 days and has noticed blood clots as well.  States she had a syncopal episode 2 days ago.  States she has had significantly decreased p.o. intake.  Reports she has spent most of her days over the past couple days sleeping due to the level of fatigue.  She also reports heavy menstrual bleeding at baseline.  Last menstrual cycle ended around June 27.  Currently without vaginal bleeding.  She has baseline iron deficiency anemia but has never required blood transfusion in the past.  The history is provided by the patient and medical records. No language interpreter was used.       Home Medications Prior to Admission medications   Medication Sig Start Date End Date Taking? Authorizing Provider  acetaminophen (TYLENOL) 650 MG CR tablet Take 1,300 mg by mouth every 8 (eight) hours as needed for pain.    [provider]  Ascorbic Acid (VITAMIN C PO) Take 1 tablet by mouth daily.    [provider]  folic acid (FOLVITE) 1 MG tablet Take 1 mg by mouth daily. 01/24/18   [provider]  hydrocortisone (ANUSOL-HC) 2.5 % rectal cream Place 1 application. rectally 2 (two) times daily. As needed for hemorrhoid pain 12/16/21   Barrett Henle, MD  methotrexate 2.5 MG tablet Take 4 tablets by mouth once a week. 02/24/19   [provider]  trolamine salicylate (ASPERCREME) 10 % cream Apply 1 application topically as needed for muscle pain.    [provider]  XELJANZ XR 11 MG TB24 Not started yet 03/10/19   [provider]      Allergies    Coconut flavor    Review of Systems   Review of Systems  Constitutional:  Positive for fatigue. Negative for chills and fever.  Gastrointestinal:  Positive for anal bleeding. Negative for abdominal pain, nausea and vomiting.  Genitourinary:  Negative for vaginal bleeding.  Neurological:  Positive for syncope and light-headedness.  All other systems reviewed and are negative.   Physical Exam Updated Vital Signs BP 127/86   Pulse 81   Temp 98.7 F (37.1 C) (Oral)   Resp 14   LMP 01/27/2022 (Approximate)   SpO2 100%  Physical Exam Vitals and nursing note reviewed. Exam conducted with a chaperone present.  Constitutional:      General: She is not in acute distress.    Appearance: Normal appearance. She is ill-appearing.  HENT:     Head: Normocephalic and atraumatic.     Nose: Nose normal.  Eyes:     General: No scleral icterus.    Extraocular Movements: Extraocular movements intact.     Conjunctiva/sclera: Conjunctivae normal.  Cardiovascular:     Rate and Rhythm: Normal rate and regular rhythm.     Pulses: Normal pulses.  Pulmonary:     Effort: Pulmonary effort is normal. No respiratory distress.     Breath sounds: Normal breath sounds. No wheezing or rales.  Abdominal:     General: There is no distension.     Palpations:  Abdomen is soft.     Tenderness: There is no abdominal tenderness. There is no guarding.  Genitourinary:    Comments: External hemorrhoids present on exam.  No active bleeding noted. Musculoskeletal:        General: Normal range of motion.     Cervical back: Normal range of motion.  Skin:    General: Skin is warm and dry.  Neurological:     General: No focal deficit present.     Mental Status: She is alert. Mental status is at baseline.     ED Results / Procedures / Treatments   Labs (all labs ordered are listed, but only abnormal results are displayed) Labs Reviewed  CBC WITH DIFFERENTIAL/PLATELET - Abnormal; Notable  for the following components:      Result Value   Hemoglobin 6.9 (*)    HCT 26.0 (*)    MCV 63.3 (*)    MCH 16.8 (*)    MCHC 26.5 (*)    RDW 21.7 (*)    Monocytes Absolute 0.0 (*)    All other components within normal limits  COMPREHENSIVE METABOLIC PANEL  I-STAT BETA HCG BLOOD, ED ([facility], [facility], [facility] ONLY)  TYPE AND SCREEN  ABO/RH  PREPARE RBC (CROSSMATCH)    EKG EKG Interpretation  Date/Time:  Friday February 06 2022 20:56:06 EDT Ventricular Rate:  106 PR Interval:  140 QRS Duration: 74 QT Interval:  342 QTC Calculation: 454 R Axis:   13 Text Interpretation: Sinus tachycardia Cannot rule out Anterior infarct , age undetermined Abnormal ECG No significant change since last tracing Confirmed by Deno Etienne [phone]) on 02/06/2022 11:24:22 PM  Radiology No results found.  Procedures .Critical Care  Performed by: Evlyn Courier, PA-C Authorized by: Evlyn Courier, PA-C   Critical care provider statement:    Critical care time (minutes):  30   Critical care was necessary to treat or prevent imminent or life-threatening deterioration of the following conditions:  Circulatory failure   Critical care was time spent personally by me on the following activities:  Development of treatment plan with patient or surrogate, discussions with consultants, evaluation of patient's response to treatment, examination of patient, ordering and review of laboratory studies, ordering and review of radiographic studies, ordering and performing treatments and interventions, pulse oximetry, re-evaluation of patient's condition and review of old charts     Medications Ordered in ED Medications  0.9 %  sodium chloride infusion (has no administration in time range)  lactated ringers bolus 2,000 mL (has no administration in time range)  diphenhydrAMINE (BENADRYL) capsule 25 mg (25 mg Oral Given 02/07/22 0003)    ED Course/ Medical Decision Making/ A&P                           Medical Decision Making Amount and/or  Complexity of Data Reviewed Labs: ordered.  Risk Prescription drug management. Decision regarding hospitalization.     Medical Decision Making / ED Course   This patient presents to the ED for concern of rectal bleeding, lightheadedness, this involves an extensive number of treatment options, and is a complaint that carries with it a high risk of complications and morbidity.  The differential diagnosis includes symptomatic anemia secondary to blood loss  MDM: 31 year old female presents with rectal bleeding.  Believes this is secondary to her hemorrhoids.  Hemoglobin of 6.9 on arrival.  Reports significant fatigue.  Syncopal episode 2 days ago.  Will provide IV hydration, and 1 unit PRBCs.  Risk and benefits discussed and patient would like to proceed.  CMP unremarkable.  CBC outside of anemia without acute concerns.  Type and cross done. Notified by nurse IV hydration has not been given as of yet.  Will discontinue IV hydration.  Discussed with hospitalist will evaluate patient for admission.   Lab Tests: -I ordered, reviewed, and interpreted labs.   The pertinent results include:   Labs Reviewed  CBC WITH DIFFERENTIAL/PLATELET - Abnormal; Notable for the following components:      Result Value   Hemoglobin 6.9 (*)    HCT 26.0 (*)    MCV 63.3 (*)    MCH 16.8 (*)    MCHC 26.5 (*)    RDW 21.7 (*)    Monocytes Absolute 0.0 (*)    All other components within normal limits  COMPREHENSIVE METABOLIC PANEL  HIV ANTIBODY (ROUTINE TESTING W REFLEX)  CBC  BASIC METABOLIC PANEL  I-STAT BETA HCG BLOOD, ED ([facility], [facility], [facility] ONLY)  TYPE AND SCREEN  ABO/RH  PREPARE RBC (CROSSMATCH)      EKG  EKG Interpretation  Date/Time:  Friday February 06 2022 20:56:06 EDT Ventricular Rate:  106 PR Interval:  140 QRS Duration: 74 QT Interval:  342 QTC Calculation: 454 R Axis:   13 Text Interpretation: Sinus tachycardia Cannot rule out Anterior infarct , age undetermined Abnormal ECG No significant  change since last tracing Confirmed by Deno Etienne [phone]) on 02/06/2022 11:24:22 PM         Medicines ordered and prescription drug management: Meds ordered this encounter  Medications   0.9 %  sodium chloride infusion   diphenhydrAMINE (BENADRYL) capsule 25 mg   DISCONTD: lactated ringers bolus 2,000 mL   sodium chloride 0.9 % bolus 2,000 mL   HYDROcodone-acetaminophen (NORCO/VICODIN) 5-325 MG per tablet 2 tablet   OR Linked Order Group    acetaminophen (TYLENOL) tablet 650 mg    acetaminophen (TYLENOL) suppository 650 mg   OR Linked Order Group    ondansetron (ZOFRAN) tablet 4 mg    ondansetron (ZOFRAN) injection 4 mg   hydrocortisone (ANUSOL-HC) 2.5 % rectal cream 1 Application    As needed for hemorrhoid pain      -I have reviewed the patients home medicines and have made adjustments as needed  Critical interventions 1 unit PRBC given  Reevaluation: After the interventions noted above, I reevaluated the patient and found that they have :improved  Co morbidities that complicate the patient evaluation  Past Medical History:  Diagnosis Date   Bipolar 1 disorder ([place])    ETOH abuse    H/O self-harm    H/O suicide attempt    x 5 - last 05/2016 - overdose Ibuprofen   Marijuana abuse    Rheumatoid arthritis ([place])       Dispostion: Discussed with hospitalist will evaluate patient for admission    Final Clinical Impression(s) / ED Diagnoses Final diagnoses:  Symptomatic anemia    Rx / DC Orders ED Discharge Orders     None         Evlyn Courier, PA-C 02/07/22 [place], [place], DO 02/07/22 [phone]

## 2022-02-07 NOTE — Assessment & Plan Note (Addendum)
Chronic menorrhagia. Required venofer infusions in past Doesn't look like shes had any since 2020 1. Cant check iron level at the moment as transfusion is ongoing. 2. Med rec pending.

## 2022-02-07 NOTE — H&P (Signed)
History and Physical    Patient: Tammy Garrison  IZT:[id number] DOB: [birth year] DOA: 02/06/2022 DOS: the patient was seen and examined on 02/07/2022 PCP: Pcp, No  Patient coming from: Home  Chief Complaint:  Chief Complaint  Patient presents with   Loss of Consciousness   HPI: Tammy Garrison is a 31 y.o. female with medical history significant of iron deficiency anemia from menorrhagia.  BPD1, prior EtOH abuse, RA.  Pt presents to the ED with c/o rectal bleeding from hemorrhoids and syncope today.  Has had intermittent rectal bleeding from hemorrhoids starting in May.  Seemed to get better, but then got worse again over this past 1 week.  Now with bleeding + clots the past 2 days.  Yesterday had episode of syncope.   Review of Systems: As mentioned in the history of present illness. All other systems reviewed and are negative. Past Medical History:  Diagnosis Date   Arthritis    Bipolar 1 disorder ([place])    ETOH abuse    H/O self-harm    H/O suicide attempt    x 5 - last 05/2016 - overdose Ibuprofen   Marijuana abuse    History reviewed. No pertinent surgical history. Social History:  reports that she quit smoking about 2 years ago. Her smoking use included cigarettes. She has a 1.75 pack-year smoking history. She has never used smokeless tobacco. She reports that she does not currently use alcohol. She reports that she does not currently use drugs after having used the following drugs: Marijuana.  Allergies  Allergen Reactions   Coconut Flavor Itching and Rash    Itchy throat, rash,itching     Family History  Problem Relation Age of Onset   Hypertension Other    Lupus Mother     Prior to Admission medications   Medication Sig Start Date End Date Taking? Authorizing Provider  acetaminophen (TYLENOL) 650 MG CR tablet Take 1,300 mg by mouth every 8 (eight) hours as needed for pain.    [provider]  Ascorbic Acid (VITAMIN C PO) Take 1 tablet  by mouth daily.    [provider]  folic acid (FOLVITE) 1 MG tablet Take 1 mg by mouth daily. 01/24/18   [provider]  hydrocortisone (ANUSOL-HC) 2.5 % rectal cream Place 1 application. rectally 2 (two) times daily. As needed for hemorrhoid pain 12/16/21   Barrett Henle, MD  methotrexate 2.5 MG tablet Take 4 tablets by mouth once a week. 02/24/19   [provider]  trolamine salicylate (ASPERCREME) 10 % cream Apply 1 application topically as needed for muscle pain.    [provider]  XELJANZ XR 11 MG TB24 Not started yet 03/10/19   [provider]    Physical Exam: Vitals:   02/07/22 0020 02/07/22 0023 02/07/22 0038 02/07/22 0222  BP: 128/82 123/82 127/86 139/78  Pulse: (!) 132 89 81 95  Resp: '18 16 14 16  '$ Temp: 98.9 F (37.2 C) 98.9 F (37.2 C) 98.7 F (37.1 C) 98.8 F (37.1 C)  TempSrc: Oral Oral Oral   SpO2: 100%  100% 100%   Constitutional: NAD, calm, comfortable Eyes: PERRL, lids and conjunctivae normal ENMT: Mucous membranes are moist. Posterior pharynx clear of any exudate or lesions.Normal dentition.  Neck: normal, supple, no masses, no thyromegaly Respiratory: clear to auscultation bilaterally, no wheezing, no crackles. Normal respiratory effort. No accessory muscle use.  Cardiovascular: Regular rate and rhythm, no murmurs / rubs / gallops. No extremity edema. 2+ pedal pulses.  No carotid bruits.  Abdomen: no tenderness, no masses palpated. No hepatosplenomegaly. Bowel sounds positive.  Musculoskeletal: no clubbing / cyanosis. No joint deformity upper and lower extremities. Good ROM, no contractures. Normal muscle tone.  Skin: no rashes, lesions, ulcers. No induration Neurologic: CN 2-12 grossly intact. Sensation intact, DTR normal. Strength 5/5 in all 4.  Psychiatric: Normal judgment and insight. Alert and oriented x 3. Normal mood.   Data Reviewed:       Latest Ref Rng & Units 02/06/2022    8:51 PM 04/21/2019    2:05 PM  03/20/2019    4:13 PM  CBC  WBC 4.0 - 10.5 K/uL 8.2  6.4  9.9   Hemoglobin 12.0 - 15.0 g/dL 6.9  10.4  9.0   Hematocrit 36.0 - 46.0 % 26.0  33.7  30.0    30.7   Platelets 150 - 400 K/uL 288  298  558    CMP     Component Value Date/Time   NA 136 02/06/2022 2051   K 3.7 02/06/2022 2051   CL 104 02/06/2022 2051   CO2 23 02/06/2022 2051   GLUCOSE 99 02/06/2022 2051   BUN 6 02/06/2022 2051   CREATININE 0.69 02/06/2022 2051   CALCIUM 9.4 02/06/2022 2051   PROT 8.1 02/06/2022 2051   ALBUMIN 3.8 02/06/2022 2051   AST 16 02/06/2022 2051   ALT 13 02/06/2022 2051   ALKPHOS 54 02/06/2022 2051   BILITOT 0.7 02/06/2022 2051   GFRNONAA >60 02/06/2022 2051   GFRAA >60 04/12/2018 1520     Assessment and Plan: * ABLA (acute blood loss anemia) ABLA from LGIB superimposed on known chronic iron def anemia from menorrhagia. 2u PRBC transfusion Tele monitor Repeat CBC post transfusion.  BRBPR (bright red blood per rectum) LGIB believed to be due to hemorrhoids. Not actively bleeding at the moment. Likely needs either GI and / or gen surg consult in AM.  Iron deficiency anemia secondary to blood loss (chronic) Chronic menorrhagia. Required venofer infusions in past Doesn't look like shes had any since 2020 Cant check iron level at the moment as transfusion is ongoing. Med rec pending.  RA (rheumatoid arthritis) ([place]) Med rec pending at this time.  Syncope Due to anemia + LGIB.      Advance Care Planning:   Code Status: Full Code  Consults: None  Family Communication: No family in room  Severity of Illness: The appropriate patient status for this patient is OBSERVATION. Observation status is judged to be reasonable and necessary in order to provide the required intensity of service to ensure the patient's safety. The patient's presenting symptoms, physical exam findings, and initial radiographic and laboratory data in the context of their medical condition is felt to place  them at decreased risk for further clinical deterioration. Furthermore, it is anticipated that the patient will be medically stable for discharge from the hospital within 2 midnights of admission.   Author: Etta Quill., DO 02/07/2022 3:13 AM  For on call review [email or web address].

## 2022-02-07 NOTE — Progress Notes (Addendum)
Subjective: Patient admitted this morning, see detailed H&P by Dr Alcario Drought 31 year old female with medical history of iron deficiency anemia from menorrhagia, bipolar disorder type I, prior EtOH abuse, rheumatoid arthritis came to ED with rectal bleeding from hemorrhoids.  Patient says that she had a syncopal episode yesterday.  She has been having intermittent rectal bleeding from hemorrhoids since May of this year.  For past week symptoms have become worse.  Now she is bleeding with clots for past 2 days.  In the ED she was found to have hemoglobin of 6.9. She was started on 2 units PRBC.  Vitals:   02/07/22 0600 02/07/22 0700  BP: 112/72 129/83  Pulse: 85 81  Resp: (!) 21 13  Temp:    SpO2: 97% 100%      A/P  Acute blood loss anemia -Secondary to bleeding hemorrhoids -2 units PRBC ordered -Hemoglobin after 1 unit of blood this morning is 7.8 -Follow CBC in a.m.  Hemorrhoidal bleed -Patient has been having hemorrhoidal bleed since May of this year -Bleed has been for past 1 week -We will consult gastroenterology for further recommendations  Iron-deficiency anemia -Blood loss anemia from chronic menorrhagia -Required Venofer infusion in the past -No labs obtained as patient was getting blood transfusion  Syncope -Likely from lower GI bleed and iron-deficiency anemia -Monitor on telemetry -Blood pressure is stable  Hypokalemia -Potassium 3.1 -Replace potassium and follow BMP in am     Tammy Garrison

## 2022-02-07 NOTE — Assessment & Plan Note (Addendum)
ABLA from LGIB superimposed on known chronic iron def anemia from menorrhagia. 1. 2u PRBC transfusion 2. Tele monitor 3. Repeat CBC post transfusion.

## 2022-02-07 NOTE — Consult Note (Addendum)
Consultation  Referring Provider:   Dr. Darrick Meigs Primary Care Physician:  Pcp, No Primary Gastroenterologist:   Althia Forts      Reason for Consultation:   IDA and rectal bleeding         HPI:   Tammy Garrison is a 31 y.o. female with a past medical history as listed below including bipolar disorder, rheumatoid arthritis and iron deficiency anemia, who presented to the hospital on 02/07/2022 with complaint of loss of consciousness.  We are consulted in regards to IDA and rectal bleeding    Per chart review it appears patient previously followed with Dr. Annamaria Boots in regards to iron deficiency anemia due to chronic blood loss.  She was last seen by their team on 03/20/2019.  Described being anemic for 2 years with significant menorrhagia.  She had recently seen a gynecologist who started on oral birth control.  She has been taking oral iron 65 mg twice daily.  Also history of Methotrexate for her rheumatoid arthritis.    Today, patient explains that she was diagnosed as iron deficient almost 5 years ago now, eventually followed with her gynecologist who tried to start her on an oral contraceptive pill given that she would typically have 7 days of very heavy menses which was thought the cause of her anemia.  Tells me she took the pill for a while but this gave her constant bleeding although it was not as heavy for about 3 months so she discontinued.  She then followed with hematology for a brief moment who had her on oral iron and she had a couple iron infusions but then she stopped following with anyone over the past 3 years.      Tells me she has continued to "chew ice constantly", and feels like she is anemic but has been "chasing money" and working 2 jobs and has just not taken any time out for her health.  She has also stopped her Methotrexate which she was on for rheumatoid arthritis and has not taken this over the past 3 years.  Most recently about a month ago noticed that she had a hemorrhoid,  felt a big lump back there which was bleeding some, this seemed to get better but then about 2 weeks ago she started having heavy bleeding with "clots" at least 1-2 times a day when she would have a bowel movement with rectal pain and even sometimes inbetween.  She felt like the hemorrhoid had gotten bigger.  She has been applying Hydrocortisone ointment twice daily over the past couple of weeks but does not feel like it is making any difference.  Tells me she has continued with her heavy periods which last for 7 days at a time.  She has been growing weaker and weaker and yesterday had an episode of syncope and came to the hospital.    Also describes chronic reflux symptoms, she has taken Tums for this in the past which does not help at all, this occurs all day long almost every day.  She has never tried any other over-the-counter or prescription medications for this.    Denies fever, chills, weight loss, abdominal pain or symptoms that awaken her from sleep.  GI history: None  Past Medical History:  Diagnosis Date   Bipolar 1 disorder ([place])    ETOH abuse    H/O self-harm    H/O suicide attempt    x 5 - last 05/2016 - overdose Ibuprofen   Marijuana abuse  Rheumatoid arthritis ([place])     Surgical History: None  Family History  Problem Relation Age of Onset   Hypertension Other    Lupus Mother    No GI cancers  Social History   Tobacco Use   Smoking status: Former    Packs/day: 0.25    Years: 7.00    Total pack years: 1.75    Types: Cigarettes    Quit date: 02/17/2019    Years since quitting: 2.9   Smokeless tobacco: Never  Vaping Use   Vaping Use: Former  Substance Use Topics   Alcohol use: Not Currently    Comment: social   Drug use: Not Currently    Types: Marijuana    Prior to Admission medications   Medication Sig Start Date End Date Taking? Authorizing Provider  acetaminophen (TYLENOL) 650 MG CR tablet Take 1,300 mg by mouth every 8 (eight) hours as needed for  pain.    [provider]  Ascorbic Acid (VITAMIN C PO) Take 1 tablet by mouth daily.    [provider]  folic acid (FOLVITE) 1 MG tablet Take 1 mg by mouth daily. 01/24/18   [provider]  hydrocortisone (ANUSOL-HC) 2.5 % rectal cream Place 1 application. rectally 2 (two) times daily. As needed for hemorrhoid pain 12/16/21   Barrett Henle, MD  methotrexate 2.5 MG tablet Take 4 tablets by mouth once a week. 02/24/19   [provider]  trolamine salicylate (ASPERCREME) 10 % cream Apply 1 application topically as needed for muscle pain.    [provider]  XELJANZ XR 11 MG TB24 Not started yet 03/10/19   [provider]    Current Facility-Administered Medications  Medication Dose Route Frequency Provider Last Rate Last Admin   acetaminophen (TYLENOL) tablet 650 mg  650 mg Oral Q6H PRN Etta Quill, DO       Or   acetaminophen (TYLENOL) suppository 650 mg  650 mg Rectal Q6H PRN Etta Quill, DO       hydrocortisone (ANUSOL-HC) 2.5 % rectal cream 1 Application  1 Application Rectal BID Etta Quill, DO   1 Application at 30/86/57 0906   ondansetron (ZOFRAN) tablet 4 mg  4 mg Oral Q6H PRN Etta Quill, DO       Or   ondansetron [facility]) injection 4 mg  4 mg Intravenous Q6H PRN Etta Quill, DO       potassium chloride SA (KLOR-CON M) CR tablet 40 mEq  40 mEq Oral Q4H Oswald Hillock, MD   40 mEq at 02/07/22 8469   Current Outpatient Medications  Medication Sig Dispense Refill   acetaminophen (TYLENOL) 650 MG CR tablet Take 1,300 mg by mouth every 8 (eight) hours as needed for pain.     Ascorbic Acid (VITAMIN C PO) Take 1 tablet by mouth daily.     folic acid (FOLVITE) 1 MG tablet Take 1 mg by mouth daily.  0   hydrocortisone (ANUSOL-HC) 2.5 % rectal cream Place 1 application. rectally 2 (two) times daily. As needed for hemorrhoid pain 30 g 1   methotrexate 2.5 MG tablet Take 4 tablets by mouth once a week.     trolamine  salicylate (ASPERCREME) 10 % cream Apply 1 application topically as needed for muscle pain.     XELJANZ XR 11 MG TB24 Not started yet      Allergies as of 02/06/2022 - Review Complete 02/06/2022  Allergen Reaction Noted   Coconut flavor Itching  and Rash 10/17/2016     Review of Systems:    Constitutional: No weight loss, fever or chills Skin: No rash  Cardiovascular: No chest pain   Respiratory: No SOB  Gastrointestinal: See HPI and otherwise negative Genitourinary: No dysuria  Neurological: +syncope Musculoskeletal: No new muscle or joint pain Hematologic: No bruising Psychiatric: No history of depression or anxiety    Physical Exam:  Vital signs in last 24 hours: Temp:  [98.5 F (36.9 C)-98.9 F (37.2 C)] 98.8 F (37.1 C) (07/08 0222) Pulse Rate:  [80-132] 80 (07/08 0915) Resp:  [13-22] 22 (07/08 0915) BP: (112-139)/(72-102) 126/90 (07/08 0907) SpO2:  [97 %-100 %] 100 % (07/08 0915) Weight:  [124.2 kg] 124.2 kg (07/07 1957)   General:   Pleasant Obese AA female appears to be in NAD, Well developed, Well nourished, alert and cooperative Head:  Normocephalic and atraumatic. Eyes:   PEERL, EOMI. No icterus. Conjunctiva pink. Ears:  Normal auditory acuity. Neck:  Supple Throat: Oral cavity and pharynx without inflammation, swelling or lesion. Teeth in good condition. Lungs: Respirations even and unlabored. Lungs clear to auscultation bilaterally.   No wheezes, crackles, or rhonchi.  Heart: Normal S1, S2. No MRG. Regular rate and rhythm. No peripheral edema, cyanosis or pallor.  Abdomen:  Soft, nondistended, nontender. No rebound or guarding. Normal bowel sounds. No appreciable masses or hepatomegaly. Rectal:  External : Grade IV non reducible hemorrhoid very tender to palpation, Internal: unable to complete due to tenderness  Msk:  Symmetrical without gross deformities. Peripheral pulses intact.  Extremities:  Without edema, no deformity or joint abnormality.  Neurologic:   Alert and  oriented x4;  grossly normal neurologically. Skin:   Dry and intact without significant lesions or rashes. Psychiatric: Demonstrates good judgement and reason without abnormal affect or behaviors.   LAB RESULTS: Recent Labs    02/06/22 2051 02/07/22 0344  WBC 8.2 8.7  HGB 6.9* 7.8*  HCT 26.0* 28.5*  PLT 288 264   BMET Recent Labs    02/06/22 2051 02/07/22 0344  NA 136 136  K 3.7 3.1*  CL 104 106  CO2 23 23  GLUCOSE 99 118*  BUN 6 7  CREATININE 0.69 0.72  CALCIUM 9.4 9.1   LFT Recent Labs    02/06/22 2051  PROT 8.1  ALBUMIN 3.8  AST 16  ALT 13  ALKPHOS 54  BILITOT 0.7    Impression / Plan:   Impression: 1.  Iron deficiency anemia: Hemoglobin 6.9 at admission--> 1 unit PRBCs--> 7.8, BUN normal, apparently per patient presumed due to chronic menorrhagia in the past which required Venofer infusions in the past, last in 2020 for this, not on oral iron over the past couple of weeks due to constipation, increased bleeding from what she presumes a hemorrhoid over the past 2 weeks, continues with menorrhagia; likely from menorrhagia possibly exacerbated by bleeding hemorrhoid over the past couple of weeks 2.  Bright red blood per rectum: Reports "hemorrhoidal bleeding" over the past few weeks, worse over the past week with some clots 1-2 times a day, Grade IV hemorrhoid on exam; likely hemorrhoid but will need to consider other causes given anemia 3.  Rheumatoid arthritis: Currently not on treatment for this 4.  Syncope: Presumed due to anemia and lower GI bleed 5.  GERD: Chronic for the patient 6. Hypokalemia: will need to be corrected prior to procedure  Plan: 1.  Will consult general surgery given grade 4 hemorrhoid on exam.  Pending their recommendations and  work-up we will consider a colonoscopy in the future for bleeding and anemia 2.  Continue to monitor hemoglobin with transfusion as needed less than 7 3.  Added Pepcid 10 mg twice daily for chronic  reflux 4.  Hypokalemia will need to be corrected   Thank you for your kind consultation, we will continue to follow.  Lavone Nian [facility]  02/07/2022, 10:38 AM

## 2022-02-07 NOTE — Assessment & Plan Note (Addendum)
LGIB believed to be due to hemorrhoids. Not actively bleeding at the moment. 1. Likely needs either GI and / or gen surg consult in AM.

## 2022-02-07 NOTE — Assessment & Plan Note (Signed)
Med rec pending at this time. 

## 2022-02-08 ENCOUNTER — Observation Stay (HOSPITAL_COMMUNITY): Payer: Self-pay | Admitting: Certified Registered Nurse Anesthetist

## 2022-02-08 ENCOUNTER — Encounter (HOSPITAL_COMMUNITY)
Admission: EM | Disposition: A | Discharge status: Home / Self Care | Payer: Self-pay | Source: Home / Self Care | Attending: Emergency Medicine

## 2022-02-08 ENCOUNTER — Encounter (HOSPITAL_COMMUNITY): Payer: Self-pay | Admitting: Internal Medicine

## 2022-02-08 ENCOUNTER — Other Ambulatory Visit: Payer: Self-pay

## 2022-02-08 ENCOUNTER — Observation Stay (HOSPITAL_BASED_OUTPATIENT_CLINIC_OR_DEPARTMENT_OTHER): Payer: Self-pay | Admitting: Certified Registered Nurse Anesthetist

## 2022-02-08 DIAGNOSIS — K648 Other hemorrhoids: Secondary | ICD-10-CM

## 2022-02-08 DIAGNOSIS — K644 Residual hemorrhoidal skin tags: Secondary | ICD-10-CM

## 2022-02-08 HISTORY — PX: HEMORRHOID SURGERY: SHX153

## 2022-02-08 LAB — CBC
HCT: 27.1 % — ABNORMAL LOW (ref 36.0–46.0)
Hemoglobin: 7.6 g/dL — ABNORMAL LOW (ref 12.0–15.0)
MCH: 18.3 pg — ABNORMAL LOW (ref 26.0–34.0)
MCHC: 28 g/dL — ABNORMAL LOW (ref 30.0–36.0)
MCV: 65.1 fL — ABNORMAL LOW (ref 80.0–100.0)
Platelets: 237 10*3/uL (ref 150–400)
RBC: 4.16 MIL/uL (ref 3.87–5.11)
RDW: 24.3 % — ABNORMAL HIGH (ref 11.5–15.5)
WBC: 8.5 10*3/uL (ref 4.0–10.5)
nRBC: 0 % (ref 0.0–0.2)

## 2022-02-08 LAB — BASIC METABOLIC PANEL
Anion gap: 7 (ref 5–15)
BUN: 9 mg/dL (ref 6–20)
CO2: 23 mmol/L (ref 22–32)
Calcium: 9 mg/dL (ref 8.9–10.3)
Chloride: 104 mmol/L (ref 98–111)
Creatinine, Ser: 0.72 mg/dL (ref 0.44–1.00)
GFR, Estimated: >60 mL/min (ref >60)
Glucose, Bld: 98 mg/dL (ref 70–99)
Potassium: 4.5 mmol/L (ref 3.5–5.1)
Sodium: 134 mmol/L — ABNORMAL LOW (ref 135–145)

## 2022-02-08 SURGERY — HEMORRHOIDECTOMY
Anesthesia: General | Site: Rectum

## 2022-02-08 SURGERY — COLONOSCOPY WITH PROPOFOL
Anesthesia: Monitor Anesthesia Care

## 2022-02-08 MED ORDER — PROPOFOL 10 MG/ML IV BOLUS
INTRAVENOUS | Status: AC
  Filled 2022-02-08: qty 20

## 2022-02-08 MED ORDER — LACTATED RINGERS IV SOLN: INTRAVENOUS | Status: DC

## 2022-02-08 MED ORDER — PROPOFOL 10 MG/ML IV BOLUS
INTRAVENOUS | Status: DC | PRN
  Administered 2022-02-08: 300 mg via INTRAVENOUS

## 2022-02-08 MED ORDER — FENTANYL CITRATE (PF) 100 MCG/2ML IJ SOLN
25.0000 ug | INTRAMUSCULAR | Status: DC | PRN
  Administered 2022-02-08: 50 ug via INTRAVENOUS

## 2022-02-08 MED ORDER — ORAL CARE MOUTH RINSE
15.0000 mL | Freq: Once | OROMUCOSAL | Status: AC
Start: 2022-02-08 — End: 2022-02-08

## 2022-02-08 MED ORDER — FENTANYL CITRATE (PF) 250 MCG/5ML IJ SOLN
INTRAMUSCULAR | Status: AC
  Filled 2022-02-08: qty 5

## 2022-02-08 MED ORDER — THROMBIN 20000 UNITS EX KIT: PACK | CUTANEOUS | Status: DC | PRN

## 2022-02-08 MED ORDER — OXYCODONE HCL 5 MG/5ML PO SOLN: 5.0000 mg | Freq: Once | ORAL | Status: AC | PRN

## 2022-02-08 MED ORDER — DEXMEDETOMIDINE (PRECEDEX) IN NS 20 MCG/5ML (4 MCG/ML) IV SYRINGE
PREFILLED_SYRINGE | INTRAVENOUS | Status: DC | PRN
  Administered 2022-02-08: 12 ug via INTRAVENOUS
  Administered 2022-02-08: 8 ug via INTRAVENOUS

## 2022-02-08 MED ORDER — BUPIVACAINE LIPOSOME 1.3 % IJ SUSP
INTRAMUSCULAR | Status: DC | PRN
  Administered 2022-02-08 (×2): 10 mL

## 2022-02-08 MED ORDER — FENTANYL CITRATE (PF) 100 MCG/2ML IJ SOLN
INTRAMUSCULAR | Status: AC
  Filled 2022-02-08: qty 2

## 2022-02-08 MED ORDER — OXYCODONE HCL 5 MG PO TABS: 5.0000 mg | ORAL_TABLET | ORAL | Status: DC | PRN

## 2022-02-08 MED ORDER — DOCUSATE SODIUM 100 MG PO CAPS
100.0000 mg | ORAL_CAPSULE | Freq: Two times a day (BID) | ORAL | Status: DC
  Administered 2022-02-08 – 2022-02-09 (×2): 100 mg via ORAL
  Filled 2022-02-08 (×2): qty 1

## 2022-02-08 MED ORDER — CHLORHEXIDINE GLUCONATE 0.12 % MT SOLN
15.0000 mL | Freq: Once | OROMUCOSAL | Status: AC
  Administered 2022-02-08: 15 mL via OROMUCOSAL
  Filled 2022-02-08: qty 15

## 2022-02-08 MED ORDER — BUPIVACAINE-EPINEPHRINE 0.5% -1:200000 IJ SOLN
INTRAMUSCULAR | Status: AC
  Filled 2022-02-08: qty 1

## 2022-02-08 MED ORDER — BUPIVACAINE LIPOSOME 1.3 % IJ SUSP
INTRAMUSCULAR | Status: AC
  Filled 2022-02-08: qty 20

## 2022-02-08 MED ORDER — MIDAZOLAM HCL 2 MG/2ML IJ SOLN
INTRAMUSCULAR | Status: DC | PRN
  Administered 2022-02-08 (×2): 2 mg via INTRAVENOUS

## 2022-02-08 MED ORDER — ACETAMINOPHEN 10 MG/ML IV SOLN
1000.0000 mg | Freq: Once | INTRAVENOUS | Status: DC | PRN
  Administered 2022-02-08: 1000 mg via INTRAVENOUS

## 2022-02-08 MED ORDER — DEXAMETHASONE SODIUM PHOSPHATE 10 MG/ML IJ SOLN
INTRAMUSCULAR | Status: AC
  Filled 2022-02-08: qty 1

## 2022-02-08 MED ORDER — ONDANSETRON HCL 4 MG/2ML IJ SOLN
INTRAMUSCULAR | Status: AC
  Filled 2022-02-08: qty 2

## 2022-02-08 MED ORDER — MIDAZOLAM HCL 2 MG/2ML IJ SOLN
INTRAMUSCULAR | Status: AC
Start: 2022-02-08 — End: ?
  Filled 2022-02-08: qty 2

## 2022-02-08 MED ORDER — OXYCODONE HCL 5 MG PO TABS
ORAL_TABLET | ORAL | Status: AC
  Filled 2022-02-08: qty 1

## 2022-02-08 MED ORDER — DEXMEDETOMIDINE HCL IN NACL 80 MCG/20ML IV SOLN
INTRAVENOUS | Status: AC
  Filled 2022-02-08: qty 20

## 2022-02-08 MED ORDER — OXYCODONE HCL 5 MG PO TABS
5.0000 mg | ORAL_TABLET | Freq: Once | ORAL | Status: AC | PRN
  Administered 2022-02-08: 5 mg via ORAL

## 2022-02-08 MED ORDER — DEXAMETHASONE SODIUM PHOSPHATE 10 MG/ML IJ SOLN
INTRAMUSCULAR | Status: DC | PRN
  Administered 2022-02-08: 10 mg via INTRAVENOUS

## 2022-02-08 MED ORDER — FENTANYL CITRATE (PF) 250 MCG/5ML IJ SOLN
INTRAMUSCULAR | Status: DC | PRN
  Administered 2022-02-08 (×4): 50 ug via INTRAVENOUS

## 2022-02-08 MED ORDER — LIDOCAINE 2% (20 MG/ML) 5 ML SYRINGE
INTRAMUSCULAR | Status: DC | PRN
  Administered 2022-02-08: 60 mg via INTRAVENOUS

## 2022-02-08 MED ORDER — AMISULPRIDE (ANTIEMETIC) 5 MG/2ML IV SOLN: 10.0000 mg | Freq: Once | INTRAVENOUS | Status: DC | PRN

## 2022-02-08 MED ORDER — MIDAZOLAM HCL 2 MG/2ML IJ SOLN
INTRAMUSCULAR | Status: AC
  Filled 2022-02-08: qty 2

## 2022-02-08 MED ORDER — PROMETHAZINE HCL 25 MG/ML IJ SOLN: 6.2500 mg | INTRAMUSCULAR | Status: DC | PRN

## 2022-02-08 MED ORDER — LIDOCAINE 2% (20 MG/ML) 5 ML SYRINGE
INTRAMUSCULAR | Status: AC
  Filled 2022-02-08: qty 5

## 2022-02-08 MED ORDER — ONDANSETRON HCL 4 MG/2ML IJ SOLN
INTRAMUSCULAR | Status: DC | PRN
  Administered 2022-02-08: 4 mg via INTRAVENOUS

## 2022-02-08 MED ORDER — THROMBIN 20000 UNITS EX KIT
PACK | CUTANEOUS | Status: AC
  Filled 2022-02-08: qty 1

## 2022-02-08 MED ORDER — ACETAMINOPHEN 10 MG/ML IV SOLN
INTRAVENOUS | Status: AC
  Filled 2022-02-08: qty 100

## 2022-02-08 SURGICAL SUPPLY — 40 items
BAG COUNTER SPONGE SURGICOUNT (BAG) ×2 IMPLANT
CANISTER SUCT 3000ML PPV (MISCELLANEOUS) ×2 IMPLANT
COVER MAYO STAND STRL (DRAPES) ×2 IMPLANT
COVER SURGICAL LIGHT HANDLE (MISCELLANEOUS) ×2 IMPLANT
DRAPE UTILITY XL STRL (DRAPES) ×2 IMPLANT
DRSG PAD ABDOMINAL 8X10 ST (GAUZE/BANDAGES/DRESSINGS) ×1 IMPLANT
ELECT REM PT RETURN 9FT ADLT (ELECTROSURGICAL) ×2
ELECTRODE REM PT RTRN 9FT ADLT (ELECTROSURGICAL) ×1 IMPLANT
GAUZE 4X4 16PLY ~~LOC~~+RFID DBL (SPONGE) ×2 IMPLANT
GAUZE SPONGE 4X4 12PLY STRL (GAUZE/BANDAGES/DRESSINGS) ×2 IMPLANT
GLOVE BIO SURGEON STRL SZ8 (GLOVE) ×2 IMPLANT
GLOVE BIOGEL PI IND STRL 8 (GLOVE) ×1 IMPLANT
GLOVE BIOGEL PI INDICATOR 8 (GLOVE) ×1
GOWN STRL REUS W/ TWL LRG LVL3 (GOWN DISPOSABLE) ×1 IMPLANT
GOWN STRL REUS W/ TWL XL LVL3 (GOWN DISPOSABLE) ×1 IMPLANT
GOWN STRL REUS W/TWL LRG LVL3 (GOWN DISPOSABLE) ×1
GOWN STRL REUS W/TWL XL LVL3 (GOWN DISPOSABLE) ×1
KIT BASIN OR (CUSTOM PROCEDURE TRAY) ×2 IMPLANT
KIT SIGMOIDOSCOPE (SET/KITS/TRAYS/PACK) IMPLANT
KIT TURNOVER KIT B (KITS) ×2 IMPLANT
NEEDLE 22X1 1/2 (OR ONLY) (NEEDLE) ×2 IMPLANT
NS IRRIG 1000ML POUR BTL (IV SOLUTION) ×2 IMPLANT
PACK LITHOTOMY IV (CUSTOM PROCEDURE TRAY) ×2 IMPLANT
PAD ARMBOARD 7.5X6 YLW CONV (MISCELLANEOUS) ×4 IMPLANT
PENCIL SMOKE EVACUATOR (MISCELLANEOUS) ×2 IMPLANT
SHEARS HARMONIC 9CM CVD (BLADE) ×1 IMPLANT
SPECIMEN JAR SMALL (MISCELLANEOUS) ×3 IMPLANT
SPONGE HEMORRHOID 8X3CM (HEMOSTASIS) ×1 IMPLANT
SPONGE SURGIFOAM ABS GEL 100 (HEMOSTASIS) ×1 IMPLANT
SPONGE T-LAP 18X18 ~~LOC~~+RFID (SPONGE) ×1 IMPLANT
SURGILUBE 2OZ TUBE FLIPTOP (MISCELLANEOUS) ×2 IMPLANT
SUT CHROMIC 2 0 SH (SUTURE) ×1 IMPLANT
SUT CHROMIC 3 0 SH 27 (SUTURE) ×1 IMPLANT
SUT VIC AB 3-0 SH 27 (SUTURE) ×1
SUT VIC AB 3-0 SH 27X BRD (SUTURE) IMPLANT
SYR CONTROL 10ML LL (SYRINGE) ×2 IMPLANT
TOWEL GREEN STERILE FF (TOWEL DISPOSABLE) ×4 IMPLANT
TUBE CONNECTING 12X1/4 (SUCTIONS) ×2 IMPLANT
UNDERPAD 30X36 HEAVY ABSORB (UNDERPADS AND DIAPERS) ×2 IMPLANT
YANKAUER SUCT BULB TIP NO VENT (SUCTIONS) ×2 IMPLANT

## 2022-02-08 NOTE — Transfer of Care (Signed)
Immediate Anesthesia Transfer of Care Note  Patient: Tammy Garrison  Procedure(s) Performed: EXTERNAL AND INTERNAL HEMORRHOIDECTOMY (Rectum) EXAM UNDER ANESTHESIA (Rectum)  Patient Location: PACU  Anesthesia Type:General  Level of Consciousness: awake and alert   Airway & Oxygen Therapy: Patient Spontanous Breathing and Patient connected to face mask oxygen  Post-op Assessment: Report given to RN and Post -op Vital signs reviewed and stable  Post vital signs: Reviewed and stable  Last Vitals:  Vitals Value Taken Time  BP 135/98 02/08/22 0854  Temp    Pulse 87 02/08/22 0859  Resp 20 02/08/22 0859  SpO2 100 % 02/08/22 0859  Vitals shown include unvalidated device data.  Last Pain:  Vitals:   02/08/22 0724  TempSrc: Oral  PainSc:          Complications: No notable events documented.

## 2022-02-08 NOTE — Anesthesia Procedure Notes (Signed)
Procedure Name: LMA Insertion Date/Time: 02/08/2022 7:56 AM  Performed by: Reece Agar, CRNAPre-anesthesia Checklist: Patient identified, Emergency Drugs available, Suction available and Patient being monitored Patient Re-evaluated:Patient Re-evaluated prior to induction Oxygen Delivery Method: Circle System Utilized Preoxygenation: Pre-oxygenation with 100% oxygen Induction Type: IV induction Ventilation: Mask ventilation without difficulty LMA: LMA inserted LMA Size: 4.0 Number of attempts: 1 Airway Equipment and Method: Bite block Placement Confirmation: positive ETCO2 Tube secured with: Tape Dental Injury: Teeth and Oropharynx as per pre-operative assessment

## 2022-02-08 NOTE — Op Note (Signed)
  02/08/2022  8:43 AM  PATIENT:  Tammy Garrison  31 y.o. female  PRE-OPERATIVE DIAGNOSIS:  grade 4 external hemorrhoid with bleeding  POST-OPERATIVE DIAGNOSIS:   grade 4 external hemorrhoid vs mass with bleeding, internal hemorrhoid  PROCEDURE:  Procedure(s): Examination under anesthesia External hemorrhoidectomy/excision anal mass 2.5cm Internal hemorrhoidectomy single column  SURGEON:  Surgeon(s): Georganna Skeans, MD  ASSISTANTS: none   ANESTHESIA:   local and general  EBL:  Total I/O In: 100 [IV Piggyback:100] Out: -   BLOOD ADMINISTERED:none  DRAINS: none   SPECIMEN:  Excision  DISPOSITION OF SPECIMEN:  PATHOLOGY  COUNTS:  YES  DICTATION: .Dragon Dictation Findings: Large anterior external hemorrhoid versus anal mass, inflamed internal hemorrhoid at 3:00 in lithotomy position  Procedure detail: Informed consent was obtained.  She received intravenous antibiotics.  She was brought to the operating room and general endotracheal anesthesia was administered by the anesthesia staff.  Her perianal region was prepped and draped in a sterile fashion.  We did a timeout procedure.  I injected Exparel circumferentially around the anus for postoperative pain relief.  Examination revealed a large anterior external hemorrhoid versus mass which was somewhat friable and bleeding.  It precluded further examination under anesthesia so attention was directed here.  I incised the anoderm along the edges of it and used the harmonic scalpel to remove the mass.  It did have a hemorrhoidal vessel that I carefully took down using the harmonic scalpel.  This was all done staying superficial to her sphincters.  The mass was sent to pathology.  At this time cautery was used to get further hemostasis and I could perform the remainder of the exam under anesthesia.  There were no masses within the rectum.  There was an inflamed internal hemorrhoid at the 3 o'clock position.  I incised the anoderm  near this which was contiguous with the previous excision and using harmonic scalpel to carefully dissect the vessel and divided.  This stayed superficial to the sphincters.  This was sent as a separate specimen as internal hemorrhoid.  Hemostasis was ensured in all areas.  I did not see any other abnormalities.  I placed an endorectal dressing of a thrombin-soaked Gelfoam sponge.  Sterile dressings were applied.  All counts were correct.  She tolerated the procedure well without apparent complication and was taken recovery in stable condition. PATIENT DISPOSITION:  PACU - hemodynamically stable.   Delay start of Pharmacological VTE agent (>24hrs) due to surgical blood loss or risk of bleeding:  yes  Georganna Skeans, MD, MPH, FACS Pager: [phone]  7/9/20238:43 AM

## 2022-02-08 NOTE — Progress Notes (Signed)
Patient ID: Tammy Garrison, female   DOB: [birth year], 31 y.o.   MRN: [id number] Day of Surgery    Subjective: In pre-op, reported more bleeding last night ROS negative except as listed above. Objective: Vital signs in last 24 hours: Temp:  [98 F (36.7 C)-98.4 F (36.9 C)] 98.1 F (36.7 C) (07/09 0724) Pulse Rate:  [69-98] 84 (07/09 0724) Resp:  [14-22] 17 (07/09 0724) BP: (103-126)/(57-90) 124/74 (07/09 0724) SpO2:  [97 %-100 %] 100 % (07/09 0724) Weight:  [124.2 kg] 124.2 kg (07/09 0722) Last BM Date : 02/07/22  Intake/Output from previous day: 07/08 0701 - 07/09 0700 In: 0  Out: 3 [Urine:3] Intake/Output this shift: No intake/output data recorded.  General appearance: alert and cooperative Resp: clear to auscultation bilaterally Cardio: regular rate and rhythm GI: soft, NT, see other note for hem  Lab Results: CBC  Recent Labs    02/07/22 0344 02/08/22 0244  WBC 8.7 8.5  HGB 7.8* 7.6*  HCT 28.5* 27.1*  PLT 264 237   BMET Recent Labs    02/06/22 2051 02/07/22 0344  NA 136 136  K 3.7 3.1*  CL 104 106  CO2 23 23  GLUCOSE 99 118*  BUN 6 7  CREATININE 0.69 0.72  CALCIUM 9.4 9.1   PT/INR No results for input(s): "LABPROT", "INR" in the last 72 hours. ABG No results for input(s): "PHART", "HCO3" in the last 72 hours.  Invalid input(s): "PCO2", "PO2"  Studies/Results: No results found.  Anti-infectives: Anti-infectives (From admission, onward)    Start     Dose/Rate Route Frequency Ordered Stop   02/08/22 0730  [MAR Hold]  cefoTEtan (CEFOTAN) 2 g in sodium chloride 0.9 % 100 mL IVPB        (MAR Hold since Sun 02/08/2022 at 0709.Hold Reason: Transfer to a Procedural area)   2 g 200 mL/hr over 30 Minutes Intravenous  Once 02/07/22 1236         Assessment/Plan: Grade 4 external hemorrhoid with bleeding and symptomatic anemia -for examination under anesthesia, external hemorrhoidectomy, possible internal hemorrhoidectomy.  Procedure, risks,  and benefits were again discussed in detail with her.  I discussed the expected postoperative course and she is agreeable.  LOS: 0 days    Georganna Skeans, MD, MPH, FACS Trauma & General Surgery Use [email or web address] to contact on call provider  02/08/2022

## 2022-02-08 NOTE — Anesthesia Postprocedure Evaluation (Signed)
Anesthesia Post Note  Patient: Tammy Garrison  Procedure(s) Performed: EXTERNAL AND INTERNAL HEMORRHOIDECTOMY (Rectum) EXAM UNDER ANESTHESIA (Rectum)     Patient location during evaluation: PACU Anesthesia Type: General Level of consciousness: awake Pain management: pain level controlled Vital Signs Assessment: post-procedure vital signs reviewed and stable Respiratory status: spontaneous breathing, nonlabored ventilation, respiratory function stable and patient connected to nasal cannula oxygen Cardiovascular status: blood pressure returned to baseline and stable Postop Assessment: no apparent nausea or vomiting Anesthetic complications: no   No notable events documented.  Last Vitals:  Vitals:   02/08/22 1000 02/08/22 1015  BP: 112/74 116/75  Pulse:    Resp: 16 15  Temp:    SpO2:      Last Pain:  Vitals:   02/08/22 0930  TempSrc: Oral  PainSc:                  Olanna Percifield P Derin Matthes

## 2022-02-08 NOTE — Progress Notes (Signed)
[facility]  PROGRESS NOTE  Tammy Garrison EUM:[id number] DOB: 1990-08-24 DOA: 02/06/2022 PCP: Pcp, No   Brief HPI:    31 year old female with medical history of iron deficiency anemia from menorrhagia, bipolar disorder type I, prior EtOH abuse, rheumatoid arthritis came to ED with rectal bleeding from hemorrhoids.  Patient says that she had a syncopal episode yesterday.  She has been having intermittent rectal bleeding from hemorrhoids since May of this year.  For past week symptoms have become worse.  Now she is bleeding with clots for past 2 days. In the ED she was found to have hemoglobin of 6.9. She was started on 2 units PRBC.  Subjective   Patient seen and examined, s/p hemorrhoidectomy.  Complains of pain   Assessment/Plan:     Acute blood loss anemia -Secondary to bleeding hemorrhoids - s/p 2 units PRBC  -Hemoglobin is 7.6   Hemorrhoids/s/p hemorrhoidectomy -Patient has been having hemorrhoidal bleed since May of this year -Has been bleeding for past 1 week -General surgery was consulted, patient underwent hemorrhoidectomy and the mass removal -Follow path report   Iron-deficiency anemia -Blood loss anemia from chronic menorrhagia -Required Venofer infusion in the past    Syncope -Likely from lower GI bleed and iron-deficiency anemia -Monitor on telemetry -Blood pressure is stable   Hypokalemia -Potassium was 3.1 -Potassium was replaced -Check BMP today      Medications     famotidine  10 mg Oral BID   fentaNYL       hydrocortisone  1 Application Rectal BID   oxyCODONE         Data Reviewed:   CBG:  No results for input(s): "GLUCAP" in the last 168 hours.  SpO2: 100 %    Vitals:   02/08/22 0930 02/08/22 0945 02/08/22 1000 02/08/22 1015  BP: 101/67 104/61 112/74 116/75  Pulse: 61     Resp: '13 16 16 15  '$ Temp: 97.6 F (36.4 C)     TempSrc: Oral     SpO2: 100%     Weight:      Height:          Data  Reviewed:  Basic Metabolic Panel: Recent Labs  Lab 02/06/22 2051 02/07/22 0344  NA 136 136  K 3.7 3.1*  CL 104 106  CO2 23 23  GLUCOSE 99 118*  BUN 6 7  CREATININE 0.69 0.72  CALCIUM 9.4 9.1    CBC: Recent Labs  Lab 02/06/22 2051 02/07/22 0344 02/08/22 0244  WBC 8.2 8.7 8.5  NEUTROABS 7.0  --   --   HGB 6.9* 7.8* 7.6*  HCT 26.0* 28.5* 27.1*  MCV 63.3* 65.8* 65.1*  PLT 288 264 237    LFT Recent Labs  Lab 02/06/22 2051  AST 16  ALT 13  ALKPHOS 54  BILITOT 0.7  PROT 8.1  ALBUMIN 3.8     Antibiotics: Anti-infectives (From admission, onward)    Start     Dose/Rate Route Frequency Ordered Stop   02/08/22 0730  cefoTEtan (CEFOTAN) 2 g in sodium chloride 0.9 % 100 mL IVPB        2 g 200 mL/hr over 30 Minutes Intravenous  Once 02/07/22 1236 02/08/22 0827        DVT prophylaxis: SCDs  Code Status: Full code  Family Communication: No family at bedside   CONSULTS gastroenterology, general surgery   Objective    Physical Examination:   General-appears in no acute distress Heart-S1-S2, regular, no murmur auscultated Lungs-clear  to auscultation bilaterally, no wheezing or crackles auscultated Abdomen-soft, nontender, no organomegaly Extremities-no edema in the lower extremities Neuro-alert, oriented x3, no focal deficit noted   Status is: Inpatient:             Oswald Hillock   [facility] If 7PM-7AM, please contact night-coverage at [email or web address], Office  [phone]   02/08/2022, 1:11 PM  LOS: 0 days

## 2022-02-08 NOTE — Progress Notes (Signed)
   Patient Name: Tammy Garrison Date of Encounter: 02/08/2022, 11:05 AM    Subjective  Status post hemorrhoidectomy/mass removal   Objective  BP 116/75   Pulse 61   Temp 97.6 F (36.4 C) (Oral)   Resp 15   Ht 6' 0.01" (1.829 m)   Wt 124.2 kg   LMP 01/27/2022 (Approximate)   SpO2 100%   BMI 37.13 kg/m  No acute distress having some rectal pain postop     Latest Ref Rng & Units 02/08/2022    2:44 AM 02/07/2022    3:44 AM 02/06/2022    8:51 PM  CBC  WBC 4.0 - 10.5 K/uL 8.5  8.7  8.2   Hemoglobin 12.0 - 15.0 g/dL 7.6  7.8  6.9   Hematocrit 36.0 - 46.0 % 27.1  28.5  26.0   Platelets 150 - 400 K/uL 237  264  288        Assessment and Plan  Iron deficiency anemia due to chronic blood loss most likely from her known menorrhagia.  Rectal bleeding from hemorrhoids grade 4 external hemorrhoid versus mass status post excision today.   Continue supportive care, would use parenteral iron.  Its not clear to me she is going to need further GI evaluation.  This will depend upon the pathology from surgery.  We are signing off at this point.  We can certainly follow-up as needed here and outpatient GI follow-up can be arranged if necessary.    Gatha Mayer, MD, [facility] See Shea Evans on call - gastroenterology for best contact person 02/08/2022 11:05 AM

## 2022-02-08 NOTE — Anesthesia Preprocedure Evaluation (Addendum)
Anesthesia Evaluation  Patient identified by MRN, date of birth, ID band Patient awake    Reviewed: Allergy & Precautions, NPO status , Patient's Chart, lab work & pertinent test results  Airway Mallampati: II  TM Distance: >3 FB Neck ROM: Full    Dental no notable dental hx.    Pulmonary former smoker,    Pulmonary exam normal        Cardiovascular negative cardio ROS Normal cardiovascular exam     Neuro/Psych PSYCHIATRIC DISORDERS Depression Bipolar Disorder negative neurological ROS     GI/Hepatic negative GI ROS, (+)     substance abuse  ,   Endo/Other  negative endocrine ROS  Renal/GU negative Renal ROS     Musculoskeletal  (+) Arthritis ,   Abdominal (+) + obese,   Peds  Hematology  (+) Blood dyscrasia, anemia ,   Anesthesia Other Findings Hemorrihoids  Reproductive/Obstetrics hcg negative                            Anesthesia Physical Anesthesia Plan  ASA: 2  Anesthesia Plan: General   Post-op Pain Management:    Induction: Intravenous  PONV Risk Score and Plan: 3 and Ondansetron, Dexamethasone, Midazolam and Treatment may vary due to age or medical condition  Airway Management Planned: LMA  Additional Equipment:   Intra-op Plan:   Post-operative Plan: Extubation in OR  Informed Consent: I have reviewed the patients History and Physical, chart, labs and discussed the procedure including the risks, benefits and alternatives for the proposed anesthesia with the patient or authorized representative who has indicated his/her understanding and acceptance.     Dental advisory given  Plan Discussed with: CRNA  Anesthesia Plan Comments:        Anesthesia Quick Evaluation

## 2022-02-09 ENCOUNTER — Encounter (HOSPITAL_COMMUNITY): Payer: Self-pay | Admitting: General Surgery

## 2022-02-09 DIAGNOSIS — K643 Fourth degree hemorrhoids: Secondary | ICD-10-CM

## 2022-02-09 LAB — TYPE AND SCREEN
ABO/RH(D): AB POS
Antibody Screen: NEGATIVE
Unit division: 0
Unit division: 0

## 2022-02-09 LAB — BPAM RBC
Blood Product Expiration Date: 202307262359
Blood Product Expiration Date: 202308112359
ISSUE DATE / TIME: 202307080014
ISSUE DATE / TIME: 202307080257
Unit Type and Rh: 6200
Unit Type and Rh: 8400

## 2022-02-09 LAB — CBC
HCT: 27.1 % — ABNORMAL LOW (ref 36.0–46.0)
Hemoglobin: 7.8 g/dL — ABNORMAL LOW (ref 12.0–15.0)
MCH: 18.6 pg — ABNORMAL LOW (ref 26.0–34.0)
MCHC: 28.8 g/dL — ABNORMAL LOW (ref 30.0–36.0)
MCV: 64.5 fL — ABNORMAL LOW (ref 80.0–100.0)
Platelets: 247 10*3/uL (ref 150–400)
RBC: 4.2 MIL/uL (ref 3.87–5.11)
RDW: 24.6 % — ABNORMAL HIGH (ref 11.5–15.5)
WBC: 12.9 10*3/uL — ABNORMAL HIGH (ref 4.0–10.5)
nRBC: 0 % (ref 0.0–0.2)

## 2022-02-09 MED ORDER — POLYETHYLENE GLYCOL 3350 17 G PO PACK
17.0000 g | PACK | Freq: Two times a day (BID) | ORAL | Status: DC
  Administered 2022-02-09: 17 g via ORAL
  Filled 2022-02-09: qty 1

## 2022-02-09 MED ORDER — ACETAMINOPHEN 500 MG PO TABS: 1000.0000 mg | ORAL_TABLET | Freq: Four times a day (QID) | ORAL | 2 refills | Status: DC | PRN

## 2022-02-09 MED ORDER — POLYETHYLENE GLYCOL 3350 17 G PO PACK: 17.0000 g | PACK | Freq: Two times a day (BID) | ORAL | 0 refills | Status: DC

## 2022-02-09 MED ORDER — OXYCODONE HCL 5 MG PO TABS: 5.0000 mg | ORAL_TABLET | ORAL | 0 refills | Status: DC | PRN

## 2022-02-09 MED ORDER — DOCUSATE SODIUM 100 MG PO CAPS: 100.0000 mg | ORAL_CAPSULE | Freq: Two times a day (BID) | ORAL | 0 refills | Status: DC

## 2022-02-09 NOTE — Discharge Summary (Signed)
Physician Discharge Summary   Patient: Tammy Garrison MRN: [id number] DOB: [birth year]  Admit date:     02/06/2022  Discharge date: 02/09/22  Discharge Physician: Oswald Hillock   PCP: Pcp, No   Recommendations at discharge:   Follow-up general surgery in 3 weeks Check serum iron, TIBC, ferritin as outpatient Check CBC in 2 weeks  Discharge Diagnoses: Principal Problem:   ABLA (acute blood loss anemia) Active Problems:   BRBPR (bright red blood per rectum)   Iron deficiency anemia secondary to blood loss (chronic)   Syncope   RA (rheumatoid arthritis) ([facility])  Resolved Problems:   * No resolved hospital problems. *  Brief history and physical 31 year old female with medical history of iron deficiency anemia from menorrhagia, bipolar disorder type I, prior EtOH abuse, rheumatoid arthritis came to ED with rectal bleeding from hemorrhoids.  Patient says that she had a syncopal episode yesterday.  She has been having intermittent rectal bleeding from hemorrhoids since May of this year.  For past week symptoms have become worse.  Now she is bleeding with clots for past 2 days. In the ED she was found to have hemoglobin of 6.9. She was started on 2 units PRBC.  Hospital course   Assessment and Plan:  Acute blood loss anemia -Secondary to bleeding hemorrhoids - s/p 2 units PRBC  -Hemoglobin is 7.6     Hemorrhoids/s/p hemorrhoidectomy -Patient has been having hemorrhoidal bleed since May of this year -Has been bleeding for past 1 week -General surgery was consulted, patient underwent hemorrhoidectomy and the mass removal -General surgery to follow-up path report as outpatient -Patient to follow-up Dr. Grandville Silos in 3 weeks as outpatient -Continue Colace and MiraLAX twice a day -Continue oxycodone as needed for pain   Iron-deficiency anemia -Blood loss anemia from chronic menorrhagia -Required Venofer infusion in the past -Hemoglobin is stable today at 7.8 -Follow-up  PCP, recheck CBC in 2 weeks -Check serum iron level, TIBC and ferritin as outpatient -She was seen by GI in the hospital -Iron deficiency likely from menorrhagia, but if still concern for GI bleed, patient can be referred to Dr. Michelene Heady, [facility]     Syncope -Likely from lower GI bleed and iron-deficiency anemia -Monitor on telemetry -Blood pressure is stable   Hypokalemia -Replete         Consultants: Gastroenterology, general surgery Procedures performed: Hemorrhoidectomy Disposition: Home Diet recommendation:  Discharge Diet Orders (From admission, onward)     Start     Ordered   02/09/22 0000  Diet - low sodium heart healthy        02/09/22 0934           Regular diet DISCHARGE MEDICATION: Allergies as of 02/09/2022       Reactions   Coconut Flavor Itching, Rash   Itchy throat, rash,itching         Medication List     STOP taking these medications    hydrocortisone 2.5 % rectal cream Commonly known as: ANUSOL-HC       TAKE these medications    acetaminophen 500 MG tablet Commonly known as: TYLENOL Take 2 tablets (1,000 mg total) by mouth every 6 (six) hours as needed.   docusate sodium 100 MG capsule Commonly known as: COLACE Take 1 capsule (100 mg total) by mouth 2 (two) times daily. What changed:  medication strength how much to take when to take this reasons to take this   Hair/Skin/Nails Caps Take 1 capsule by mouth daily.  oxyCODONE 5 MG immediate release tablet Commonly known as: Oxy IR/ROXICODONE Take 1-2 tablets (5-10 mg total) by mouth every 4 (four) hours as needed for moderate pain or severe pain.   polyethylene glycol 17 g packet Commonly known as: MIRALAX / GLYCOLAX Take 17 g by mouth 2 (two) times daily.   trolamine salicylate 10 % cream Commonly known as: ASPERCREME Apply 1 application topically as needed for muscle pain.   VITAMIN C PO Take 1 tablet by mouth daily.   VITAMIN D PO Take 1 capsule by mouth  daily.        Follow-up Information     [place]. Call.   Why: make an appointment to establish a [facility] doctor Contact information: [place] [phone]        Georganna Skeans, MD. Call in 3 week(s).   Specialty: General Surgery Why: We are working on your appointment,call to confirm, Arrive 27mn early to check in, fill out paperwork, BEngineer, civil (consulting)ID and insurance information Contact information: [place] [phone]               Discharge Exam: FDanley DankerWeights   02/08/22 0722  Weight: 124.2 kg   General-appears in no acute distress Heart-S1-S2, regular, no murmur auscultated Lungs-clear to auscultation bilaterally, no wheezing or crackles auscultated Abdomen-soft, nontender, no organomegaly Extremities-no edema in the lower extremities Neuro-alert, oriented x3, no focal deficit noted  Condition at discharge: good  The results of significant diagnostics from this hospitalization (including imaging, microbiology, ancillary and laboratory) are listed below for reference.   Imaging Studies: No results found.  Microbiology: Results for orders placed or performed during the hospital encounter of 02/06/22  MRSA Next Gen by PCR, Nasal     Status: None   Collection Time: 02/07/22  4:05 PM   Specimen: Nasal Mucosa; Nasal Swab  Result Value Ref Range Status   MRSA by PCR Next Gen NOT DETECTED NOT DETECTED Final    Comment: (NOTE) The GeneXpert MRSA Assay (FDA approved for NASAL specimens only), is one component of a comprehensive MRSA colonization surveillance program. It is not intended to diagnose MRSA infection nor to guide or monitor treatment for MRSA infections. Test performance is not FDA approved in patients less than 252years old. Performed at [facility] [address], [place] [id number]     Labs: CBC: Recent Labs  Lab 02/06/22 2051 02/07/22 0344 02/08/22 0244 02/09/22 0222  WBC 8.2 8.7 8.5 12.9*  NEUTROABS 7.0  --   --   --   HGB 6.9* 7.8* 7.6* 7.8*  HCT 26.0* 28.5* 27.1* 27.1*  MCV 63.3* 65.8* 65.1* 64.5*  PLT 288 264 237 2916  Basic Metabolic Panel: Recent Labs  Lab 02/06/22 2051 02/07/22 0344 02/08/22 1400  NA 136 136 134*  K 3.7 3.1* 4.5  CL 104 106 104  CO2 '23 23 23  '$ GLUCOSE 99 118* 98  BUN '6 7 9  '$ CREATININE 0.69 0.72 0.72  CALCIUM 9.4 9.1 9.0   Liver Function Tests: Recent Labs  Lab 02/06/22 2051  AST 16  ALT 13  ALKPHOS 54  BILITOT 0.7  PROT 8.1  ALBUMIN 3.8   CBG: No results for input(s): "GLUCAP" in the last 168 hours.  Discharge time spent: greater than 30 minutes.  Signed: GOswald Hillock MD [facility] 02/09/2022

## 2022-02-09 NOTE — Progress Notes (Addendum)
SWOT RN did d/c summary w/ pt. NT accompanying pt to private vehicle where pt's family will transport pt home. Belongings w/ pt.  RN spoke w/ MD to make sure it was ok for pt to leave now since pt's bleeding is scant after she experienced some bleeding after MD examined pt and MD gave ok.

## 2022-02-09 NOTE — Progress Notes (Signed)
    1 Day Post-Op  Subjective: Doing ok, having some pain as expected.  No BM since surgery, some flatus.  Eating well.  Still with some intermittent oozing as expected.   Objective: Vital signs in last 24 hours: Temp:  [97.6 F (36.4 C)-99 F (37.2 C)] 98.4 F (36.9 C) (07/10 0809) Pulse Rate:  [61-94] 75 (07/10 0657) Resp:  [13-20] 17 (07/10 0809) BP: (101-135)/(61-98) 112/66 (07/10 0809) SpO2:  [98 %-100 %] 98 % (07/10 0657) Last BM Date : 02/07/22  Intake/Output from previous day: 07/09 0701 - 07/10 0700 In: 1140 [P.O.:240; I.V.:800; IV Piggyback:100] Out: 10 [Blood:10] Intake/Output this shift: No intake/output data recorded.  PE: Gen: NAD Buttock: no active bleeding currently, no hemorrhoids noted.  Lab Results:  Recent Labs    02/08/22 0244 02/09/22 0222  WBC 8.5 12.9*  HGB 7.6* 7.8*  HCT 27.1* 27.1*  PLT 237 247   BMET Recent Labs    02/07/22 0344 02/08/22 1400  NA 136 134*  K 3.1* 4.5  CL 106 104  CO2 23 23  GLUCOSE 118* 98  BUN 7 9  CREATININE 0.72 0.72  CALCIUM 9.1 9.0   PT/INR No results for input(s): "LABPROT", "INR" in the last 72 hours. CMP     Component Value Date/Time   NA 134 (L) 02/08/2022 1400   K 4.5 02/08/2022 1400   CL 104 02/08/2022 1400   CO2 23 02/08/2022 1400   GLUCOSE 98 02/08/2022 1400   BUN 9 02/08/2022 1400   CREATININE 0.72 02/08/2022 1400   CALCIUM 9.0 02/08/2022 1400   PROT 8.1 02/06/2022 2051   ALBUMIN 3.8 02/06/2022 2051   AST 16 02/06/2022 2051   ALT 13 02/06/2022 2051   ALKPHOS 54 02/06/2022 2051   BILITOT 0.7 02/06/2022 2051   GFRNONAA >60 02/08/2022 1400   GFRAA >60 04/12/2018 1520   Lipase     Component Value Date/Time   LIPASE 31 09/12/2013 2055       Studies/Results: No results found.  Anti-infectives: Anti-infectives (From admission, onward)    Start     Dose/Rate Route Frequency Ordered Stop   02/08/22 0730  cefoTEtan (CEFOTAN) 2 g in sodium chloride 0.9 % 100 mL IVPB        2  g 200 mL/hr over 30 Minutes Intravenous  Once 02/07/22 1236 02/08/22 0827        Assessment/Plan POD 1, s/p EUA with external hemorrhoidectomy/excision anal mass 2.5cm and internal hemorrhoidectomy single column by Dr. Grandville Silos, 02/08/22 -path pending -hgb stable today around 7.9 -Colace and miralax BID to keep stools soft  -sitz bathes q 4 hours  -surgically stable for DC home when medically stable. -follow up, DC pain meds, etc are being arranged.  FEN - regulard VTE - on hold due to anemia ID - none currently needed  ABL anemia Bipolar I DO RA    LOS: 0 days    Henreitta Cea , [facility] 02/09/2022, 8:12 AM Please see Amion for pager number during day hours 7:00am-4:30pm or 7:00am -11:30am on weekends

## 2022-02-09 NOTE — Plan of Care (Signed)

## 2022-02-09 NOTE — Progress Notes (Signed)
Patient given discharge instructions and stated understanding. 

## 2022-02-11 LAB — SURGICAL PATHOLOGY

## 2022-02-17 ENCOUNTER — Telehealth: Payer: Self-pay | Admitting: Hematology

## 2022-02-17 ENCOUNTER — Telehealth: Payer: Self-pay | Admitting: Radiation Oncology

## 2022-02-17 NOTE — Telephone Encounter (Signed)
7/18 @ 9:07 am Left voicemail for patient to call our office.

## 2022-02-17 NOTE — Telephone Encounter (Signed)
Scheduled appt per 7/18 referral. Called pt, no answer. Left msg with appt date/time. Requested for pt to call back to confirm appt. Pt was already established with Dr. Burr Medico for same dx.

## 2022-02-18 ENCOUNTER — Telehealth: Payer: Self-pay | Admitting: Radiation Oncology

## 2022-02-18 NOTE — Telephone Encounter (Signed)
7/19 @ 8:11 am Left voicemail for patient to call our office to be schedule for a consult with Dr. Lisbeth Renshaw.

## 2022-02-19 ENCOUNTER — Telehealth: Payer: Self-pay | Admitting: Radiation Oncology

## 2022-02-19 NOTE — Telephone Encounter (Signed)
7/20 @ 8:40 am Left voicemail for patient to call our office to be scheduled for consult with Dr. Lisbeth Renshaw.  Also spoke to patient's mother/she will let her know to call us.

## 2022-02-20 ENCOUNTER — Telehealth: Payer: Self-pay | Admitting: Radiation Oncology

## 2022-02-23 ENCOUNTER — Encounter: Payer: Self-pay | Admitting: Licensed Clinical Social Worker

## 2022-02-23 ENCOUNTER — Encounter: Payer: Self-pay | Admitting: Hematology

## 2022-02-23 ENCOUNTER — Telehealth: Payer: Self-pay | Admitting: Hematology

## 2022-02-23 ENCOUNTER — Inpatient Hospital Stay: Payer: Medicaid Other | Admitting: Hematology

## 2022-02-23 ENCOUNTER — Inpatient Hospital Stay: Payer: Medicaid Other | Attending: Hematology | Admitting: Hematology

## 2022-02-23 ENCOUNTER — Inpatient Hospital Stay: Payer: Self-pay

## 2022-02-23 ENCOUNTER — Other Ambulatory Visit: Payer: Self-pay

## 2022-02-23 VITALS — BP 138/95 | HR 111 | Temp 98.4°F | Resp 17 | Wt 262.8 lb

## 2022-02-23 DIAGNOSIS — D500 Iron deficiency anemia secondary to blood loss (chronic): Secondary | ICD-10-CM | POA: Insufficient documentation

## 2022-02-23 DIAGNOSIS — C210 Malignant neoplasm of anus, unspecified: Secondary | ICD-10-CM

## 2022-02-23 DIAGNOSIS — M069 Rheumatoid arthritis, unspecified: Secondary | ICD-10-CM | POA: Insufficient documentation

## 2022-02-23 DIAGNOSIS — F17210 Nicotine dependence, cigarettes, uncomplicated: Secondary | ICD-10-CM | POA: Insufficient documentation

## 2022-02-23 DIAGNOSIS — F319 Bipolar disorder, unspecified: Secondary | ICD-10-CM | POA: Insufficient documentation

## 2022-02-23 LAB — IRON AND IRON BINDING CAPACITY (CC-WL,HP ONLY)
Iron: 18 ug/dL — ABNORMAL LOW (ref 28–170)
Saturation Ratios: 3 % — ABNORMAL LOW (ref 10.4–31.8)
TIBC: 582 ug/dL — ABNORMAL HIGH (ref 250–450)
UIBC: 564 ug/dL — ABNORMAL HIGH (ref 148–442)

## 2022-02-23 LAB — CBC WITH DIFFERENTIAL/PLATELET
Abs Immature Granulocytes: 0.01 10*3/uL (ref 0.00–0.07)
Basophils Absolute: 0 10*3/uL (ref 0.0–0.1)
Basophils Relative: 1 %
Eosinophils Absolute: 0.2 10*3/uL (ref 0.0–0.5)
Eosinophils Relative: 3 %
HCT: 28.6 % — ABNORMAL LOW (ref 36.0–46.0)
Hemoglobin: 8.1 g/dL — ABNORMAL LOW (ref 12.0–15.0)
Immature Granulocytes: 0 %
Lymphocytes Relative: 18 %
Lymphs Abs: 1.5 10*3/uL (ref 0.7–4.0)
MCH: 18.4 pg — ABNORMAL LOW (ref 26.0–34.0)
MCHC: 28.3 g/dL — ABNORMAL LOW (ref 30.0–36.0)
MCV: 64.9 fL — ABNORMAL LOW (ref 80.0–100.0)
Monocytes Absolute: 0.6 10*3/uL (ref 0.1–1.0)
Monocytes Relative: 7 %
Neutro Abs: 5.8 10*3/uL (ref 1.7–7.7)
Neutrophils Relative %: 71 %
Platelets: 566 10*3/uL — ABNORMAL HIGH (ref 150–400)
RBC: 4.41 MIL/uL (ref 3.87–5.11)
RDW: 24.4 % — ABNORMAL HIGH (ref 11.5–15.5)
WBC: 8.1 10*3/uL (ref 4.0–10.5)
nRBC: 0 % (ref 0.0–0.2)

## 2022-02-23 LAB — SAMPLE TO BLOOD BANK

## 2022-02-23 LAB — CMP (CANCER CENTER ONLY)
ALT: 15 U/L (ref 0–44)
AST: 16 U/L (ref 15–41)
Albumin: 4 g/dL (ref 3.5–5.0)
Alkaline Phosphatase: 61 U/L (ref 38–126)
Anion gap: 6 (ref 5–15)
BUN: 10 mg/dL (ref 6–20)
CO2: 25 mmol/L (ref 22–32)
Calcium: 9 mg/dL (ref 8.9–10.3)
Chloride: 106 mmol/L (ref 98–111)
Creatinine: 0.75 mg/dL (ref 0.44–1.00)
GFR, Estimated: >60 mL/min (ref >60)
Glucose, Bld: 105 mg/dL — ABNORMAL HIGH (ref 70–99)
Potassium: 4 mmol/L (ref 3.5–5.1)
Sodium: 137 mmol/L (ref 135–145)
Total Bilirubin: 0.4 mg/dL (ref 0.3–1.2)
Total Protein: 7.6 g/dL (ref 6.5–8.1)

## 2022-02-23 MED ORDER — HYDROCODONE-ACETAMINOPHEN 5-325 MG PO TABS: 1.0000 | ORAL_TABLET | Freq: Four times a day (QID) | ORAL | 0 refills | Status: DC | PRN

## 2022-02-23 NOTE — Telephone Encounter (Signed)
R/s pt's appt time per 7/24 secure chat with Dr. Burr Medico. Spoke to pt who is aware of new appt time.

## 2022-02-23 NOTE — Progress Notes (Signed)
[place]   [phone] Fax:([phone]) [place] Note   Patient Care Team: Pcp, No as PCP - General [name], MD (Rheumatology) Tyson Dense, MD as Consulting Physician (Obstetrics and Gynecology) Truitt Merle, MD as Consulting Physician (Hematology and Oncology)  Date of Service:  02/23/2022   CHIEF COMPLAINTS/PURPOSE OF CONSULTATION:  Newly Diagnosed Anal Cancer  REFERRING PHYSICIAN:  Dr. Grandville Silos  ASSESSMENT & PLAN:  Tammy Garrison is a 31 y.o. pre-menopausal female with  1. Anal Cancer, cT2NxMx, HPV(+) -presented with worsening rectal bleeding, began passing clots and was admitted 02/06/22 for anemia from blood loss. S/p hemorrhoidectomy on 02/08/22 by Dr. 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 -No clinical suspicion for metastatic disease, we will get a PET scan for staging. --I reviewed the pathology results with the patient and her mother today.  Also her anal cancer was partially removed by surgery, she had a positive margin, likely still have residual disease.  I discussed what surgery will likely end up her with permanent colostomy, and I recommend concurrent chemoradiation as curative intent, if PET scan is negative for distant metastasis.   -I recommend Robin Searing regimen with concurrent chemoradiation, which consist of mitomycin on day 1 and 5-FU on day 1-5, and repeat again on week 5. I recommend PICC line placement for chemo. -If there is suspicion for metastatic disease on PET scan, I recommend further biopsy if feasible. -She will schedule her appointment with Dr. Lisbeth Renshaw to discuss radiation, hopefully later this week. -Also scheduled to see colorectal surgeon Dr. Dema Severin next week.  2. Rectal Bleeding, Pain, Constipation -she began passing blood clots in early 01/2022. -s/p hemorrhoidectomy, she reports she  still has hematochezia and pain with BM. -pain is rated 7-8/10 with/after a BM and 5/10 today (had a BM this morning). She is unable to sit or lay flat and has to sit on one side. I will call in hydrocodone for her. -she notes she has tried miralax previously with relief. I advised her to continue daily to maintain soft BM.  3. Anemia, from bleeding and iron deficiency -low hgb dating back to 04/2018, prior to that in 2017 was WNL. She has heavy periods, requiring pad/tampon change every 30 minutes. -previously on oral iron, received two doses IV Feraheme in 04/2019. -hgb was 6.9 on admission on 02/06/22. She was given 1u pRBC in the hospital. -we will check her labs again today and plan for additional doses of IV Feraheme or Venofer as needed.  4. Rheumatoid Arthritis -previously on methotrexate, has been off for at least 2 years due to lack of insurance coverage. -she reports issues with her left arm and legs. -she is now on medicaid; I recommend reconnecting with her rheumatologist  5. Social Support -currently unemployed, secondary to absence from her rectal symptoms. -she lives alone with two cats, no children. -she was recently approved for Medicaid for family-planning, she did not cover cancer treatment.  She has met our social worker Manuela Schwartz today.    PLAN:  -lab today including CBC, CMP and iron study. -schedule appointment with rad onc -PET scan to be done in 1 to 2 weeks -IV iron with Feraheme or Venofer in next few weeks. -We will schedule her chemo after her visit with Dr. Lisbeth Renshaw and PET returns    Oncology History  Anal cancer [facility])  02/08/2022 Pathology Results  FINAL MICROSCOPIC DIAGNOSIS:   A. SOFT TISSUE, ANTERIOR MASS VS. HEMORRHOID, EXCISION:  Invasive squamous cell carcinoma, focally keratinizing and  well-differentiated.  Carcinoma is present at the margins of excision.   B. HEMORRHOID, INTERNAL, EXCISION:  Hemorrhoid without the overlying epithelium.  Separate  fragment of benign squamous epithelium.  Negative for neoplasm.   ADDENDUM:  P16 immunostain is strongly positive suggestive of this lesion being the HPV related.    02/23/2022 Initial Diagnosis   Anal cancer ([place])   03/09/2022 -  Chemotherapy   Patient is on Treatment Plan : ANUS Mitomycin D1,28 / 5FU D1-4, 28-31 q32d        HISTORY OF PRESENTING ILLNESS:  Tammy Garrison 31 y.o. female is a here because of anal cancer. The patient was referred by Dr. Grandville Silos. I previously saw her in 03/2019 for anemia, but she was lost to f/u. The patient presents to the clinic today accompanied by her mother.   She reports she first noticed rectal bleeding a few months ago. She explains she began passing blood clots, prompting her to present to urgent care on 02/06/22. She was taken for hemorrhoidectomy on 02/08/22 and was found to have a 2.5 cm external hemorrhoid/mass. Pathology unfortunately revealed invasive squamous cell carcinoma, focally keratinizing and well-differentiated. Carcinoma was present at excision margins. P16 immunostain was positive, suggestive of HPV-related cancer. A second, internal hemorrhoid was negative.   Today the patient notes they felt/feeling prior/after... -still passing clots -has persistent rectal pain, rated 5/10 at our visit and 7-8/10 with/after a BM, causing difficulty walking and sitting after BM. She explains she tried milk of magnesia, which caused cramps, and miralax, which worked for her. -bloating -mild headaches -denies weight loss, eating well overall  She has a PMHx of.... -RA, not currently on medicine or followed by rheumatologist due to insurance. -menorrhagia  -denies mood disorder diagnosis despite prior notes in her chart (including ED visit for suicide attempt in 2017)  Socially... -lives alone, currently not working due to symptoms -she is single, no children -no family history of cancer on her mother's side to their knowledge  -drinks  alcohol socially -current smoker, "occasionally." Denies drug use.   REVIEW OF SYSTEMS:    Constitutional: Denies fevers, chills or abnormal night sweats Eyes: Denies blurriness of vision, double vision or watery eyes Ears, nose, mouth, throat, and face: Denies mucositis or sore throat Respiratory: Denies cough, dyspnea or wheezes Cardiovascular: Denies palpitation, chest discomfort or lower extremity swelling Gastrointestinal:  Denies nausea, heartburn, (+) rectal pain, rated 5/10, (+) bloating Skin: Denies abnormal skin rashes Lymphatics: Denies new lymphadenopathy or easy bruising Neurological:Denies numbness, tingling or new weaknesses Behavioral/Psych: Mood is stable, no new changes  All other systems were reviewed with the patient and are negative.   MEDICAL HISTORY:  Past Medical History:  Diagnosis Date   Bipolar 1 disorder ([place])    ETOH abuse    H/O self-harm    H/O suicide attempt    x 5 - last 05/2016 - overdose Ibuprofen   Marijuana abuse    Rheumatoid arthritis ([place])     SURGICAL HISTORY: Past Surgical History:  Procedure Laterality Date   HEMORRHOID SURGERY N/A 02/08/2022   Procedure: EXTERNAL AND INTERNAL HEMORRHOIDECTOMY;  Surgeon: Georganna Skeans, MD;  Location: [facility];  Service: General;  Laterality: N/A;    SOCIAL HISTORY: Social History   Socioeconomic History   Marital status: Single    Spouse name: Not on file   Number of children: 0  Years of education: Not on file   Highest education level: Not on file  Occupational History   Occupation: call center   Tobacco Use   Smoking status: Former    Packs/day: 0.25    Years: 7.00    Total pack years: 1.75    Types: Cigarettes    Quit date: 02/17/2019    Years since quitting: 3.0   Smokeless tobacco: Never  Vaping Use   Vaping Use: Former  Substance and Sexual Activity   Alcohol use: Not Currently    Comment: social   Drug use: Not Currently    Types: Marijuana   Sexual activity: Yes     Birth control/protection: None  Other Topics Concern   Not on file  Social History Narrative   Not on file   Social Determinants of Health   Financial Resource Strain: Not on file  Food Insecurity: Not on file  Transportation Needs: Not on file  Physical Activity: Not on file  Stress: Not on file  Social Connections: Not on file  Intimate Partner Violence: Not on file    FAMILY HISTORY: Family History  Problem Relation Age of Onset   Hypertension Other    Lupus Mother     ALLERGIES:  is allergic to coconut flavor.  MEDICATIONS:  Current Outpatient Medications  Medication Sig Dispense Refill   HYDROcodone-acetaminophen (NORCO/VICODIN) 5-325 MG tablet Take 1 tablet by mouth every 6 (six) hours as needed for moderate pain. 30 tablet 0   acetaminophen (TYLENOL) 500 MG tablet Take 2 tablets (1,000 mg total) by mouth every 6 (six) hours as needed. 100 tablet 2   Ascorbic Acid (VITAMIN C PO) Take 1 tablet by mouth daily.     docusate sodium (COLACE) 100 MG capsule Take 1 capsule (100 mg total) by mouth 2 (two) times daily. 10 capsule 0   Multiple Vitamins-Minerals (HAIR/SKIN/NAILS) CAPS Take 1 capsule by mouth daily.     polyethylene glycol (MIRALAX / GLYCOLAX) 17 g packet Take 17 g by mouth 2 (two) times daily. 14 each 0   trolamine salicylate (ASPERCREME) 10 % cream Apply 1 application topically as needed for muscle pain.     VITAMIN D PO Take 1 capsule by mouth daily.     No current facility-administered medications for this visit.    PHYSICAL EXAMINATION: ECOG PERFORMANCE STATUS: 2 - Symptomatic, <50% confined to bed  Vitals:   02/23/22 1435  BP: (!) 138/95  Pulse: (!) 111  Resp: 17  Temp: 98.4 F (36.9 C)  SpO2: 100%   Filed Weights   02/23/22 1435  Weight: 262 lb 12.8 oz (119.2 kg)    GENERAL:alert, no distress and comfortable SKIN: skin color, texture, turgor are normal, no rashes or significant lesions EYES: normal, Conjunctiva are pink and non-injected,  sclera clear  LYMPH:  no palpable lymphadenopathy in the inguinal LUNGS: clear to auscultation and percussion with normal breathing effort HEART: regular rate & rhythm and no murmurs and no lower extremity edema ABDOMEN:abdomen soft, non-tender and normal bowel sounds Musculoskeletal:no cyanosis of digits and no clubbing  NEURO: alert & oriented x 3 with fluent speech, no focal motor/sensory deficits RECTAL: No visible perianal mass. No blood or discharge.  Due to her pain, rectal exam was not performed.      LABORATORY DATA:  I have reviewed the data as listed    Latest Ref Rng & Units 02/23/2022    4:02 PM 02/09/2022    2:22 AM 02/08/2022    2:44 AM  CBC  WBC 4.0 - 10.5 K/uL 8.1  12.9  8.5   Hemoglobin 12.0 - 15.0 g/dL 8.1  7.8  7.6   Hematocrit 36.0 - 46.0 % 28.6  27.1  27.1   Platelets 150 - 400 K/uL 566  247  237        Latest Ref Rng & Units 02/23/2022    4:02 PM 02/08/2022    2:00 PM 02/07/2022    3:44 AM  CMP  Glucose 70 - 99 mg/dL 105  98  118   BUN 6 - 20 mg/dL $Remove'10  9  7   'JYpPTOQ$ Creatinine 0.44 - 1.00 mg/dL 0.75  0.72  0.72   Sodium 135 - 145 mmol/L 137  134  136   Potassium 3.5 - 5.1 mmol/L 4.0  4.5  3.1   Chloride 98 - 111 mmol/L 106  104  106   CO2 22 - 32 mmol/L $RemoveB'25  23  23   'hGyCTfQP$ Calcium 8.9 - 10.3 mg/dL 9.0  9.0  9.1   Total Protein 6.5 - 8.1 g/dL 7.6     Total Bilirubin 0.3 - 1.2 mg/dL 0.4     Alkaline Phos 38 - 126 U/L 61     AST 15 - 41 U/L 16     ALT 0 - 44 U/L 15        RADIOGRAPHIC STUDIES: I have personally reviewed the radiological images as listed and agreed with the findings in the report. No results found.   Orders Placed This Encounter  Procedures   NM PET Image Initial (PI) Skull Base To Thigh    Standing Status:   Future    Standing Expiration Date:   02/23/2023    Order Specific Question:   If indicated for the ordered procedure, I authorize the administration of a radiopharmaceutical per Radiology protocol    Answer:   Yes    Order Specific Question:    Is the patient pregnant?    Answer:   No    Order Specific Question:   Preferred imaging location?    Answer:   [facility]   Comprehensive metabolic panel    Standing Status:   Standing    Number of Occurrences:   50    Standing Expiration Date:   02/24/2023   CBC with Differential/Platelet    Standing Status:   Standing    Number of Occurrences:   50    Standing Expiration Date:   02/24/2023   Ferritin    Standing Status:   Standing    Number of Occurrences:   20    Standing Expiration Date:   02/24/2023   Iron and TIBC    Standing Status:   Standing    Number of Occurrences:   20    Standing Expiration Date:   02/24/2023   Sample to Blood Bank    Standing Status:   Future    Number of Occurrences:   1    Standing Expiration Date:   02/24/2023    All questions were answered. The patient knows to call the clinic with any problems, questions or concerns. The total time spent in the appointment was 60 minutes.     Truitt Merle, MD 02/23/2022 5:38 PM  I, Wilburn Mylar, am acting as scribe for Truitt Merle, MD.   I have reviewed the above documentation for accuracy and completeness, and I agree with the above.

## 2022-02-23 NOTE — Progress Notes (Signed)
START ON PATHWAY REGIMEN - Anal Carcinoma     One cycle, concurrent with RT:     Fluorouracil      Mitomycin   **Always confirm dose/schedule in your pharmacy ordering system**  Patient Characteristics: Anal Canal Tumors, Newly Diagnosed - Locoregional Disease (Clinical Staging) Therapeutic Status: Newly Diagnosed - Locoregional Disease (Clinical Staging) AJCC T Category: cT1 AJCC N Category: cN0 AJCC M Category: cM0 AJCC 9 Stage Grouping: I Check here if patient was staged using an edition other than AJCC Staging 9th Edition: false Intent of Therapy: Curative Intent, Discussed with Patient 

## 2022-02-23 NOTE — Progress Notes (Signed)
I met with Tammy Garrison and her mother, Corey Skains after  her consultation with Dr Burr Medico.  I explained my role as a nurse navigator and provided my contact information. I explained the services provided at [facility] and provided written information.  I explained the alight grant and let  her know one of the financial advisors will reach out to  her at the time of her chemo education class. I briefly explained the chemo regimen mitomycin/5-FU. I briefly explained insertion of a PICC. I explained that she will receive chemotherapy the first week of radiation therapy and the last week of radiation therapy. I made her an appt with radiation oncology and provided that information in written form. I told her that she will be scheduled for chemotherapy education class prior to receiving chemotherapy.  I told her our schedulers will call her with those appts. I told her I will get her PET scan scheduled and call her with that appt.  All questions were answered. They verbalized understanding.

## 2022-02-24 LAB — FERRITIN: Ferritin: 4 ng/mL — ABNORMAL LOW (ref 11–307)

## 2022-02-24 NOTE — Progress Notes (Signed)
I was unable to contact Venezia at her phone number listed.  I called her mom, Corey Skains and let her know the new PET scan appt date, time and instructions.  She will let Rosealynn know.

## 2022-02-25 NOTE — Progress Notes (Signed)
GI Location of Tumor / Histology: Anal Cancer, cT2NxMx, HPV(+)  Tammy Garrison presented with worsening rectal bleeding, passing clots and was admitted 02/06/2022 for anemia.  PET 03/02/2022:  Biopsies of Anal Mass 02/08/2022    Past/Anticipated interventions by surgeon, if any:  Dr. 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  Dr. Dema Severin -No appointment scheduled at this time.   Past/Anticipated interventions by medical oncology, if any:  Dr. Burr Medico 02/23/2022 --I reviewed the pathology results with the patient and her mother today.  Also her anal cancer was partially removed by surgery, she had a positive margin, likely still have residual disease.   --I discussed what surgery will likely end up her with permanent colostomy, and I recommend concurrent chemoradiation as curative intent, if PET scan is negative for distant metastasis.   -If there is suspicion for metastatic disease on PET scan, I recommend further biopsy if feasible. -She will schedule her appointment with Dr. Lisbeth Renshaw to discuss radiation, hopefully later this week. -Also scheduled to see colorectal surgeon Dr. Dema Severin next week.    Weight changes, if any:   Bowel/Bladder complaints, if any:   Nausea / Vomiting, if any:   Pain issues, if any:    Any blood per rectum:     SAFETY ISSUES: Prior radiation?  Pacemaker/ICD?  Possible current pregnancy? Having periods Is the patient on methotrexate? Previously on methotrexate, has been off for at least 2 years.  Current Complaints/Details:

## 2022-02-26 ENCOUNTER — Encounter: Payer: Self-pay | Admitting: Hematology

## 2022-02-26 ENCOUNTER — Other Ambulatory Visit: Payer: Self-pay

## 2022-02-26 ENCOUNTER — Ambulatory Visit
Admission: RE | Admit: 2022-02-26 | Discharge: 2022-02-26 | Disposition: A | Discharge status: Home / Self Care | Payer: Medicaid Other | Source: Ambulatory Visit | Attending: Radiation Oncology | Admitting: Radiation Oncology

## 2022-02-26 ENCOUNTER — Ambulatory Visit (HOSPITAL_COMMUNITY)
Admission: RE | Admit: 2022-02-26 | Discharge: 2022-02-26 | Disposition: A | Discharge status: Home / Self Care | Payer: Medicaid Other | Source: Ambulatory Visit | Attending: Hematology | Admitting: Hematology

## 2022-02-26 ENCOUNTER — Encounter: Payer: Self-pay | Admitting: Radiation Oncology

## 2022-02-26 VITALS — BP 118/76 | HR 108 | Temp 98.0°F | Resp 18 | Ht 72.0 in | Wt 258.5 lb

## 2022-02-26 DIAGNOSIS — C210 Malignant neoplasm of anus, unspecified: Secondary | ICD-10-CM

## 2022-02-26 DIAGNOSIS — F1010 Alcohol abuse, uncomplicated: Secondary | ICD-10-CM | POA: Insufficient documentation

## 2022-02-26 DIAGNOSIS — Z87891 Personal history of nicotine dependence: Secondary | ICD-10-CM | POA: Insufficient documentation

## 2022-02-26 DIAGNOSIS — M069 Rheumatoid arthritis, unspecified: Secondary | ICD-10-CM | POA: Insufficient documentation

## 2022-02-26 DIAGNOSIS — Z9151 Personal history of suicidal behavior: Secondary | ICD-10-CM | POA: Insufficient documentation

## 2022-02-26 DIAGNOSIS — F319 Bipolar disorder, unspecified: Secondary | ICD-10-CM | POA: Insufficient documentation

## 2022-02-26 LAB — GLUCOSE, CAPILLARY: Glucose-Capillary: 114 mg/dL — ABNORMAL HIGH (ref 70–99)

## 2022-02-26 MED ORDER — FLUDEOXYGLUCOSE F - 18 (FDG) INJECTION
12.9000 | Freq: Once | INTRAVENOUS | Status: AC
  Administered 2022-02-26: 12.8 via INTRAVENOUS

## 2022-02-26 NOTE — Progress Notes (Signed)
[place] Work  Initial Assessment   Tammy Garrison is a 31 y.o. year old female accompanied by patient and mother. Clinical Social Work was referred by medical provider for assessment of psychosocial needs.   SDOH (Social Determinants of Health) assessments performed: Yes SDOH Interventions    Flowsheet Row Most Recent Value  SDOH Interventions   Financial Strain Interventions Financial Counselor       SDOH Screenings   Alcohol Screen: Not on file  Depression (GUR4-2): Not on file  Financial Resource Strain: High Risk (02/26/2022)   Overall Financial Resource Strain (CARDIA)    Difficulty of Paying Living Expenses: Very hard  Food Insecurity: Not on file  Housing: Not on file  Physical Activity: Not on file  Social Connections: Not on file  Stress: Not on file  Tobacco Use: Medium Risk (02/23/2022)   Patient History    Smoking Tobacco Use: Former    Smokeless Tobacco Use: Never    Passive Exposure: Not on file  Transportation Needs: Not on file     Distress Screen completed: No     No data to display            Family/Social Information:  Housing Arrangement: patient lives alone with 2 cats. Family members/support persons in your life? Pt's mother accompanied her to appointment and will continue to provide transportation and necessary support as able.  Pt also has a brother and sister residing locally. Transportation concerns: Not at present, but will address should transportation needs arise.  Employment: Unemployed Pt states she has not worked since May due to her current health condition.  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  Income source: No income Financial concerns: Yes, current concerns Type of concern: Utilities, Rent/ mortgage, and Medical bills, at present pt is financially being supported by family Food access concerns: no Religious or spiritual practice: Yes-Christian Services Currently in place:  At present pt has FP  Medicaid.  Pt advised to reach out to DSS to expand Medicaid coverage as the current coverage will not cover treatment.  Coping/ Adjustment to diagnosis: Patient understands treatment plan and what happens next? yes Concerns about diagnosis and/or treatment: Overwhelmed by information, How I will pay for the services I need, and How will I care for myself Patient reported stressors: Insurance, Finances, Anxiety/ nervousness, and Adjusting to my illness Hopes and/or priorities: Pt's priority is to initiate treatment w/ the hope of a positive result. Patient enjoys time with family/ friends Current coping skills/ strengths: Motivation for treatment/growth  and Supportive family/friends     SUMMARY: Current SDOH Barriers:  Financial constraints related to unemployment and insufficient Medicaid coverage, Medication procurement, and Pmhx of previous suicide attempts x5 with the last being in 2017.  Pt states at that time she was hospitalized in behavioral health for 2 weeks w/ no suicidal ideation since that time.  Pt states she has never taken medication for depression, nor for bi-polar disorder which is listed as a diagnosis in her medical record.  Pt is not connected to a mental health provider and reports she has never engaged in counseling w/ the exception of the inpatient behavioral health admission.  Clinical Social Work Clinical Goal(s):  Explore community resource options for unmet needs related to:  Financial Strain  Patient will work with SW to address concerns related to Pmhx of depression.  Interventions: Discussed common feeling and emotions when being diagnosed with cancer, and the importance of support during treatment Informed patient of the  support team roles and support services at [facility] Provided [place] contact information and encouraged patient to call with any questions or concerns Referred patient to financial resources.  Will provide pt w/ additional available community resources  for financial assistance.  CSW spoke w/ pt regarding the need for emotional support at present given diagnosis and anticipated treatment as pt is more vulnerable to depression given her history.  Pt verbalized understanding and will maintain communication w/ CSW regarding emotional support.   Follow Up Plan: Patient will contact CSW with any support or resource needs and CSW will check in w/ pt periodically as appropriate throughout duration of treatment to ensure pt has adequate emotional support.  Patient verbalizes understanding of plan: Yes    Henriette Combs, LCSW

## 2022-02-26 NOTE — Progress Notes (Signed)
Radiation Oncology         [phone]) [phone] ________________________________  Name: Tammy Garrison        MRN: [id number]  Date of Service: 02/26/2022 DOB: 1990-11-16  CC:Pcp, No  Tammy Skeans, MD     REFERRING PHYSICIAN: Georganna Skeans, MD   DIAGNOSIS: The encounter diagnosis was Anal cancer [facility]).   HISTORY OF PRESENT ILLNESS: Tammy Garrison is a 31 y.o. female seen at the request of Dr. Grandville Silos for new diagnosis of anal cancer.  The had been evaluated for pain and swelling in her rectum for approximately a month.  She returned to the emergency department on 02/07/2022 with syncope complaining of rectal bleeding hemorrhoids and was admitted.  She was evaluated by GI and it was felt that she had a large hemorrhoid on examination.  She was seen by general surgery and Dr. Grandville Silos evaluated the patient and recommended surgical resection.  She underwent external and internal hemorrhoidectomy on 02/08/2022 and final pathology from the procedure showed invasive squamous cell carcinoma focally keratinizing and well differentiated.  Carcinoma was present at the margins of the excision.  Internally the additional tissue was consistent with hemorrhoid and her p16 immunostaining was strongly positive suggesting HPV related carcinoma.  Of note the patient has had normal Pap smear testing with her most recent in 2020 when she had a normal Pap smear and cotesting was negative for high risk HPV.  She is scheduled to undergo PET scan this afternoon and is seen today to discuss a course of chemoradiation.    PREVIOUS RADIATION THERAPY: No   PAST MEDICAL HISTORY:  Past Medical History:  Diagnosis Date   Bipolar 1 disorder ([place])    ETOH abuse    H/O self-harm    H/O suicide attempt    x 5 - last 05/2016 - overdose Ibuprofen   Marijuana abuse    Rheumatoid arthritis ([place])        PAST SURGICAL HISTORY: Past Surgical History:  Procedure Laterality Date   HEMORRHOID SURGERY N/A  02/08/2022   Procedure: EXTERNAL AND INTERNAL HEMORRHOIDECTOMY;  Surgeon: Tammy Skeans, MD;  Location: [place];  Service: General;  Laterality: N/A;     FAMILY HISTORY:  Family History  Problem Relation Age of Onset   Hypertension Other    Lupus Mother      SOCIAL HISTORY:  reports that she quit smoking about 3 years ago. Her smoking use included cigarettes. She has a 1.75 pack-year smoking history. She has never used smokeless tobacco. She reports that she does not currently use alcohol. She reports that she does not currently use drugs after having used the following drugs: Marijuana.  The patient is single.  She lives in [place] and worked for [employer] prior to her diagnosis. She is accompanied by her mother.    ALLERGIES: Coconut flavor   MEDICATIONS:  Current Outpatient Medications  Medication Sig Dispense Refill   acetaminophen (TYLENOL) 500 MG tablet Take 2 tablets (1,000 mg total) by mouth every 6 (six) hours as needed. 100 tablet 2   Ascorbic Acid (VITAMIN C PO) Take 1 tablet by mouth daily.     docusate sodium (COLACE) 100 MG capsule Take 1 capsule (100 mg total) by mouth 2 (two) times daily. 10 capsule 0   HYDROcodone-acetaminophen (NORCO/VICODIN) 5-325 MG tablet Take 1 tablet by mouth every 6 (six) hours as needed for moderate pain. 30 tablet 0   Multiple Vitamins-Minerals (HAIR/SKIN/NAILS) CAPS Take 1 capsule by mouth daily.  polyethylene glycol (MIRALAX / GLYCOLAX) 17 g packet Take 17 g by mouth 2 (two) times daily. 14 each 0   trolamine salicylate (ASPERCREME) 10 % cream Apply 1 application topically as needed for muscle pain.     VITAMIN D PO Take 1 capsule by mouth daily.     No current facility-administered medications for this encounter.     REVIEW OF SYSTEMS: On review of systems, the patient reports that she is doing better since her surgery. Her pain is still present when passing bowel movements, and small amounts of bleeding occur but no more heavy clots  like she had previously noted. She is having regular menstrual cycles that are heavy, and she is currently menstruating. She reports she is sexually active but does not use contraception but does not plan to be active during treatment.     PHYSICAL EXAM:  Wt Readings from Last 3 Encounters:  02/26/22 258 lb 8 oz (117.3 kg)  02/23/22 262 lb 12.8 oz (119.2 kg)  02/08/22 273 lb 13 oz (124.2 kg)   Temp Readings from Last 3 Encounters:  02/26/22 98 F (36.7 C) (Oral)  02/23/22 98.4 F (36.9 C) (Oral)  02/09/22 98.4 F (36.9 C) (Oral)   BP Readings from Last 3 Encounters:  02/26/22 118/76  02/23/22 (!) 138/95  02/09/22 112/66   Pulse Readings from Last 3 Encounters:  02/26/22 (!) 108  02/23/22 (!) 111  02/09/22 75   Pain Assessment Pain Score: 0-No pain/10  In general this is a well appearing African-American female in no acute distress. She's alert and oriented x4 and appropriate throughout the examination. Cardiopulmonary assessment is negative for acute distress and she exhibits normal effort.     ECOG = 1  0 - Asymptomatic (Fully active, able to carry on all predisease activities without restriction)  1 - Symptomatic but completely ambulatory (Restricted in physically strenuous activity but ambulatory and able to carry out work of a light or sedentary nature. For example, light housework, office work)  2 - Symptomatic, <50% in bed during the day (Ambulatory and capable of all self care but unable to carry out any work activities. Up and about more than 50% of waking hours)  3 - Symptomatic, >50% in bed, but not bedbound (Capable of only limited self-care, confined to bed or chair 50% or more of waking hours)  4 - Bedbound (Completely disabled. Cannot carry on any self-care. Totally confined to bed or chair)  5 - Death   Eustace Pen MM, Creech RH, Tormey DC, et al. [phone]). "Toxicity and response criteria of the [employer]". [place] Oncol. 5 (6):  649-55    LABORATORY DATA:  Lab Results  Component Value Date   WBC 8.1 02/23/2022   HGB 8.1 (L) 02/23/2022   HCT 28.6 (L) 02/23/2022   MCV 64.9 (L) 02/23/2022   PLT 566 (H) 02/23/2022   Lab Results  Component Value Date   NA 137 02/23/2022   K 4.0 02/23/2022   CL 106 02/23/2022   CO2 25 02/23/2022   Lab Results  Component Value Date   ALT 15 02/23/2022   AST 16 02/23/2022   ALKPHOS 61 02/23/2022   BILITOT 0.4 02/23/2022      RADIOGRAPHY: No results found.     IMPRESSION/PLAN: 1. Squamous cell carcinoma of the anus. Dr. Lisbeth Renshaw discusses the pathology findings and reviews the nature of anal carcinoma. He discusses the rationale for chemoradiation for treatment of her disease. Her PET scan will  confirm that this is appropriate and her staging overall. We discussed the risks, benefits, short, and long term effects of radiotherapy, as well as the curative intent, and the patient is interested in proceeding. Dr. Lisbeth Renshaw discusses the delivery and logistics of radiotherapy and anticipates a course of 6 weeks of radiotherapy. Written consent is obtained and placed in the chart, a copy was provided to the patient. She will simulate tomorrow afternoon. 2. Contraceptive Counseling. She is aware to avoid pregnancy during radiation. If she desires to consider hormonal contraceptives she will let me know, but currently plans abstinence and hence does not need pregnancy testing prior to simulation. 3. Risks of long term vaginal stenosis, rectal stenosis, and pelvic floor dysfunction. We will refer the patient to physical therapy for pelvic floor prehab. We have discussed the need for vaginal dilation and gave her supplies.  4. Rheumatoid Arthritis. The patient is not taking methotrexate and knows that she needs to avoid this during radiation; but desires to get re-established with rheumatology. A referral will be placed.    In a visit lasting 60 minutes, greater than 50% of the time was spent  face to face discussing the patient's condition, in preparation for the discussion, and coordinating the patient's care.   The above documentation reflects my direct findings during this shared patient visit. Please see the separate note by Dr. Lisbeth Renshaw on this date for the remainder of the patient's plan of care.    Carola Rhine, [facility]   **Disclaimer: This note was dictated with voice recognition software. Similar sounding words can inadvertently be transcribed and this note may contain transcription errors which may not have been corrected upon publication of note.**

## 2022-02-27 ENCOUNTER — Other Ambulatory Visit: Payer: Self-pay

## 2022-02-27 ENCOUNTER — Ambulatory Visit
Admission: RE | Admit: 2022-02-27 | Discharge: 2022-02-27 | Disposition: A | Discharge status: Home / Self Care | Payer: Medicaid Other | Source: Ambulatory Visit | Attending: Radiation Oncology | Admitting: Radiation Oncology

## 2022-02-27 VITALS — BP 133/92 | HR 104 | Resp 18

## 2022-02-27 DIAGNOSIS — C210 Malignant neoplasm of anus, unspecified: Secondary | ICD-10-CM | POA: Insufficient documentation

## 2022-02-27 DIAGNOSIS — Z510 Encounter for antineoplastic radiation therapy: Secondary | ICD-10-CM | POA: Insufficient documentation

## 2022-02-27 MED ORDER — HYDROMORPHONE HCL 1 MG/ML IJ SOLN
0.5000 mg | Freq: Once | INTRAMUSCULAR | Status: AC
  Administered 2022-02-27: 0.5 mg via INTRAMUSCULAR
  Filled 2022-02-27: qty 1

## 2022-02-28 ENCOUNTER — Encounter: Payer: Self-pay | Admitting: Radiation Oncology

## 2022-03-02 ENCOUNTER — Encounter (HOSPITAL_COMMUNITY): Admission: RE | Admit: 2022-03-02 | Payer: Medicaid Other | Source: Ambulatory Visit

## 2022-03-02 ENCOUNTER — Other Ambulatory Visit: Payer: Self-pay

## 2022-03-02 ENCOUNTER — Inpatient Hospital Stay: Payer: Self-pay | Admitting: Nutrition

## 2022-03-02 DIAGNOSIS — C210 Malignant neoplasm of anus, unspecified: Secondary | ICD-10-CM

## 2022-03-02 NOTE — Progress Notes (Signed)
31 year old female diagnosed with anal cancer, HPV+ and followed by Dr. Burr Medico and Dr, Lisbeth Renshaw.  Plan is for concurrent chemotherapy and radiation treatments.  PMH includes Rheumatoid Arthritis, Bipolar Disorder, ETOH and Marijuana.  Medications include Colace and Miralax daily, Vitamin C and D.  Labs  include glucose of 105, HGB 8.1  Height: 6' Weight: 258 pounds. UBW: 280 Pounds. BMI: 35.06. ECOG: 2  Patient reports she usually is up all night and sleeps during the day. She generally eats one meal daily such as 3 wings and pasta salad. She snacks on Rice Krispie treats or sun chips. She eats ice all day. She prefers unsweetened tea to drink. She comments on how this diagnosis is very scary.   Nutrition Diagnosis: Food and Nutrition Related knowledge deficit related to new dx of anal cancer as evidenced by no prior need for nutrition related information. Unintended wt loss related to inadequate oral intake as evidenced by 8% wt loss.  Intervention: Educated to consume small, frequent meals and snacks. Encouraged low fiber diet. Reviewed high protein foods. Establish bowel regimen. Fact sheets provided. Questions answered. Contact information given.  Monitoring, Evaluation, Goals: Tolerate low fiber diet with adequate protein to maintain lean body mass.  Next Visit: Monday, August 7 during infusion.

## 2022-03-02 NOTE — Progress Notes (Signed)
Pharmacist Chemotherapy Monitoring - Initial Assessment    Anticipated start date: 03/09/22   The following has been reviewed per standard work regarding the patient's treatment regimen: The patient's diagnosis, treatment plan and drug doses, and organ/hematologic function Lab orders and baseline tests specific to treatment regimen  The treatment plan start date, drug sequencing, and pre-medications Prior authorization status  Patient's documented medication list, including drug-drug interaction screen and prescriptions for anti-emetics and supportive care specific to the treatment regimen The drug concentrations, fluid compatibility, administration routes, and timing of the medications to be used The patient's access for treatment and lifetime cumulative dose history, if applicable  The patient's medication allergies and previous infusion related reactions, if applicable   Changes made to treatment plan:  N/A  Follow up needed:  F/U doses - Cap Mitomycin @ '20mg'$  and 5FU at '2000mg'$ /day ('8000mg'$  total) if MD uses full dose. Message sent to MD.   Acquanetta Belling, RPH, BCPS, BCOP 03/02/2022  1:05 PM

## 2022-03-02 NOTE — Progress Notes (Signed)
I spoke with Tammy Garrison and reviewed her upcoming PICC insertion appt and chemotherapy appt. All questions were answered.  She verbalized understanding.

## 2022-03-02 NOTE — Progress Notes (Signed)
Request for PICC placement for second cycle of chemotherapy scheduled for 9/11

## 2022-03-03 ENCOUNTER — Ambulatory Visit: Payer: Medicaid Other

## 2022-03-04 ENCOUNTER — Other Ambulatory Visit: Payer: Self-pay

## 2022-03-04 ENCOUNTER — Other Ambulatory Visit: Payer: Self-pay | Admitting: Hematology

## 2022-03-04 ENCOUNTER — Inpatient Hospital Stay (HOSPITAL_COMMUNITY): Admission: RE | Admit: 2022-03-04 | Payer: Medicaid Other | Source: Ambulatory Visit

## 2022-03-04 DIAGNOSIS — D500 Iron deficiency anemia secondary to blood loss (chronic): Secondary | ICD-10-CM

## 2022-03-04 DIAGNOSIS — C210 Malignant neoplasm of anus, unspecified: Secondary | ICD-10-CM

## 2022-03-04 NOTE — Progress Notes (Signed)
..  Patient is receiving Assistance Medication - Supplied Externally. Medication: Feraheme Manufacture: Feraheme Assist/ [place] Approval Dates: Approved from 03/04/2022 for 2 doses. ID: [id number] Reason: Self Pay

## 2022-03-04 NOTE — Progress Notes (Signed)
The proposed treatment discussed in conference is for discussion purpose only and is not a binding recommendation.  The patients have not been physically examined, or presented with their treatment options.  Therefore, final treatment plans cannot be decided.  

## 2022-03-05 ENCOUNTER — Other Ambulatory Visit: Payer: Self-pay | Admitting: Student

## 2022-03-05 ENCOUNTER — Other Ambulatory Visit: Payer: Self-pay

## 2022-03-05 ENCOUNTER — Telehealth: Payer: Self-pay

## 2022-03-05 ENCOUNTER — Ambulatory Visit: Payer: Self-pay | Admitting: *Deleted

## 2022-03-05 VITALS — BP 142/100 | Wt 260.5 lb

## 2022-03-05 DIAGNOSIS — R2232 Localized swelling, mass and lump, left upper limb: Secondary | ICD-10-CM

## 2022-03-05 DIAGNOSIS — Z1239 Encounter for other screening for malignant neoplasm of breast: Secondary | ICD-10-CM

## 2022-03-05 DIAGNOSIS — N6322 Unspecified lump in the left breast, upper inner quadrant: Secondary | ICD-10-CM

## 2022-03-05 DIAGNOSIS — N6325 Unspecified lump in the left breast, overlapping quadrants: Secondary | ICD-10-CM

## 2022-03-05 DIAGNOSIS — R2231 Localized swelling, mass and lump, right upper limb: Secondary | ICD-10-CM

## 2022-03-05 DIAGNOSIS — R590 Localized enlarged lymph nodes: Secondary | ICD-10-CM

## 2022-03-05 NOTE — Progress Notes (Signed)
Tammy Garrison is a 31 y.o. female who presents to [facility] clinic today with complaint of left breast lump that she noticed yesterday that is tender to the touch. Patient had a PET scan completed 02/26/2022 that left axillary adenopathy was noted that needs follow up. Patient has recently been diagnosed with Anal cancer and begins treatment next Monday, March 09, 2022.   Pap Smear: Pap smear not completed today. Last Pap smear was 02/10/2019 at [facility] and was normal with negative HPV. Per patient has no history of an abnormal Pap smear. Last Pap smear result is available in Epic.   Physical exam: Breasts Breasts symmetrical. No skin abnormalities bilateral breasts. No nipple retraction bilateral breasts. No nipple discharge bilateral breasts. No lymphadenopathy. Palpated a pea sized lump within the right axilla at 11 o'clock 32 cm from the nipple. Palpated a lump within the left inner  breast at 9 o'clock 27 cm from the nipple. Palpated a pea sized lump within the left axilla. Complaints of tenderness when palpated all three lumps.   Pelvic/Bimanual Pap is not indicated today per BCCCP guidelines.   Smoking History: Patient is a current smoker that vapes. Discussed smoking cessation with patient and referred to the [facility] Quitline.   Patient Navigation: Patient education provided. Access to services provided for patient through [facility] program. Patient has food insecurities. Patient escorted to the    Breast and Cervical Cancer Risk Assessment: Patient has family history of her paternal grandmother having breast cancer. Patient has no known genetic mutations or history of radiation treatment to the chest before age 24. Patient does not have history of cervical dysplasia, immunocompromised, or DES exposure in-utero. Breast cancer risk assessment completed. No breast cancer risk calculated due to patient is less than 70 years old.  Risk Assessment     Risk Scores        03/05/2022   Last edited by: Royston Bake, CMA   5-year risk:    Lifetime risk:             A: BCCCP exam without pap smear Complaint of left breast lump.  P: Referred patient to the [place] for a diagnostic mammogram and bilateral breast ultrasound. Have spoke with Gust Brooms. [facility] will call patient to schedule appointment. Requested an appointment today or tomorrow due to patient begins treatment on Monday.  Loletta Parish, RN 03/05/2022 2:00 PM

## 2022-03-05 NOTE — Patient Instructions (Addendum)
Explained breast self awareness with Tammy Garrison. Patient did not need a Pap smear today due to last Pap smear and HPV typing was 02/10/2019. Let her know BCCCP will cover Pap smears and HPV typing every 5 years unless has a history of abnormal Pap smears. Referred patient to the [facility] for a diagnostic mammogram and bilateral breast ultrasound. Have spoke with Jasper Riling Mammogram Tech supervisor to schedule. [facility] will call patient to schedule appointment. Requested an appointment today or tomorrow due to patient begins treatment on Monday. Patient aware of appointment and will be there. Discussed smoking cessation with patient and referred to the [facility] Quitline. Tammy Garrison verbalized understanding.  Tammy Garrison, Arvil Chaco, RN 2:00 PM

## 2022-03-05 NOTE — Telephone Encounter (Signed)
Telephoned patient at mobile number. Left message with BCCCP scheduling information.

## 2022-03-06 ENCOUNTER — Other Ambulatory Visit: Payer: Self-pay | Admitting: Obstetrics and Gynecology

## 2022-03-06 ENCOUNTER — Ambulatory Visit
Admission: RE | Admit: 2022-03-06 | Discharge: 2022-03-06 | Disposition: A | Discharge status: Home / Self Care | Payer: Medicaid Other | Source: Ambulatory Visit | Attending: Obstetrics and Gynecology | Admitting: Obstetrics and Gynecology

## 2022-03-06 ENCOUNTER — Ambulatory Visit
Admission: RE | Admit: 2022-03-06 | Discharge: 2022-03-06 | Disposition: A | Discharge status: Home / Self Care | Payer: No Typology Code available for payment source | Source: Ambulatory Visit | Attending: Obstetrics and Gynecology | Admitting: Obstetrics and Gynecology

## 2022-03-06 ENCOUNTER — Other Ambulatory Visit: Payer: Self-pay | Admitting: Hematology

## 2022-03-06 ENCOUNTER — Inpatient Hospital Stay: Payer: No Typology Code available for payment source | Attending: Hematology

## 2022-03-06 ENCOUNTER — Encounter: Payer: Self-pay | Admitting: Hematology

## 2022-03-06 ENCOUNTER — Ambulatory Visit
Admission: RE | Admit: 2022-03-06 | Discharge: 2022-03-06 | Disposition: A | Discharge status: Home / Self Care | Payer: Medicaid Other | Source: Ambulatory Visit | Attending: Hematology | Admitting: Hematology

## 2022-03-06 DIAGNOSIS — C20 Malignant neoplasm of rectum: Secondary | ICD-10-CM | POA: Insufficient documentation

## 2022-03-06 DIAGNOSIS — Z79899 Other long term (current) drug therapy: Secondary | ICD-10-CM | POA: Insufficient documentation

## 2022-03-06 DIAGNOSIS — Z87891 Personal history of nicotine dependence: Secondary | ICD-10-CM | POA: Insufficient documentation

## 2022-03-06 DIAGNOSIS — R112 Nausea with vomiting, unspecified: Secondary | ICD-10-CM | POA: Insufficient documentation

## 2022-03-06 DIAGNOSIS — R590 Localized enlarged lymph nodes: Secondary | ICD-10-CM | POA: Insufficient documentation

## 2022-03-06 DIAGNOSIS — C210 Malignant neoplasm of anus, unspecified: Secondary | ICD-10-CM

## 2022-03-06 DIAGNOSIS — D500 Iron deficiency anemia secondary to blood loss (chronic): Secondary | ICD-10-CM | POA: Insufficient documentation

## 2022-03-06 DIAGNOSIS — R109 Unspecified abdominal pain: Secondary | ICD-10-CM | POA: Insufficient documentation

## 2022-03-06 DIAGNOSIS — M069 Rheumatoid arthritis, unspecified: Secondary | ICD-10-CM | POA: Insufficient documentation

## 2022-03-06 DIAGNOSIS — E860 Dehydration: Secondary | ICD-10-CM | POA: Insufficient documentation

## 2022-03-06 DIAGNOSIS — Z5111 Encounter for antineoplastic chemotherapy: Secondary | ICD-10-CM | POA: Insufficient documentation

## 2022-03-06 DIAGNOSIS — N6322 Unspecified lump in the left breast, upper inner quadrant: Secondary | ICD-10-CM

## 2022-03-06 MED ORDER — ONDANSETRON HCL 8 MG PO TABS: 8.0000 mg | ORAL_TABLET | Freq: Two times a day (BID) | ORAL | 1 refills | Status: DC | PRN

## 2022-03-06 MED ORDER — PROCHLORPERAZINE MALEATE 10 MG PO TABS: 10.0000 mg | ORAL_TABLET | Freq: Four times a day (QID) | ORAL | 1 refills | Status: DC | PRN

## 2022-03-08 DIAGNOSIS — C210 Malignant neoplasm of anus, unspecified: Secondary | ICD-10-CM | POA: Insufficient documentation

## 2022-03-09 ENCOUNTER — Other Ambulatory Visit (HOSPITAL_COMMUNITY): Payer: Self-pay

## 2022-03-09 ENCOUNTER — Ambulatory Visit (HOSPITAL_COMMUNITY)
Admission: RE | Admit: 2022-03-09 | Discharge: 2022-03-09 | Disposition: A | Discharge status: Home / Self Care | Payer: Medicaid Other | Source: Ambulatory Visit | Attending: Hematology | Admitting: Hematology

## 2022-03-09 ENCOUNTER — Inpatient Hospital Stay (HOSPITAL_BASED_OUTPATIENT_CLINIC_OR_DEPARTMENT_OTHER): Payer: Self-pay | Admitting: Hematology

## 2022-03-09 ENCOUNTER — Other Ambulatory Visit: Payer: Medicaid Other

## 2022-03-09 ENCOUNTER — Encounter: Payer: Self-pay | Admitting: Licensed Clinical Social Worker

## 2022-03-09 ENCOUNTER — Encounter: Payer: Self-pay | Admitting: Hematology

## 2022-03-09 ENCOUNTER — Inpatient Hospital Stay: Payer: No Typology Code available for payment source

## 2022-03-09 ENCOUNTER — Inpatient Hospital Stay: Payer: No Typology Code available for payment source | Admitting: Nutrition

## 2022-03-09 ENCOUNTER — Other Ambulatory Visit: Payer: Self-pay

## 2022-03-09 ENCOUNTER — Ambulatory Visit: Payer: Medicaid Other

## 2022-03-09 ENCOUNTER — Ambulatory Visit
Admission: RE | Admit: 2022-03-09 | Discharge: 2022-03-09 | Disposition: A | Discharge status: Home / Self Care | Payer: No Typology Code available for payment source | Source: Ambulatory Visit | Attending: Radiation Oncology | Admitting: Radiation Oncology

## 2022-03-09 VITALS — BP 124/79 | HR 89 | Temp 98.1°F | Resp 17 | Wt 262.5 lb

## 2022-03-09 DIAGNOSIS — D500 Iron deficiency anemia secondary to blood loss (chronic): Secondary | ICD-10-CM

## 2022-03-09 DIAGNOSIS — C210 Malignant neoplasm of anus, unspecified: Secondary | ICD-10-CM

## 2022-03-09 DIAGNOSIS — D649 Anemia, unspecified: Secondary | ICD-10-CM

## 2022-03-09 LAB — COMPREHENSIVE METABOLIC PANEL
ALT: 19 U/L (ref 0–44)
AST: 17 U/L (ref 15–41)
Albumin: 4.2 g/dL (ref 3.5–5.0)
Alkaline Phosphatase: 59 U/L (ref 38–126)
Anion gap: 4 — ABNORMAL LOW (ref 5–15)
BUN: 12 mg/dL (ref 6–20)
CO2: 27 mmol/L (ref 22–32)
Calcium: 9 mg/dL (ref 8.9–10.3)
Chloride: 105 mmol/L (ref 98–111)
Creatinine, Ser: 0.64 mg/dL (ref 0.44–1.00)
GFR, Estimated: >60 mL/min (ref >60)
Glucose, Bld: 113 mg/dL — ABNORMAL HIGH (ref 70–99)
Potassium: 4 mmol/L (ref 3.5–5.1)
Sodium: 136 mmol/L (ref 135–145)
Total Bilirubin: 0.4 mg/dL (ref 0.3–1.2)
Total Protein: 8 g/dL (ref 6.5–8.1)

## 2022-03-09 LAB — CBC WITH DIFFERENTIAL/PLATELET
Abs Immature Granulocytes: 0.02 10*3/uL (ref 0.00–0.07)
Basophils Absolute: 0 10*3/uL (ref 0.0–0.1)
Basophils Relative: 0 %
Eosinophils Absolute: 0.3 10*3/uL (ref 0.0–0.5)
Eosinophils Relative: 3 %
HCT: 25.4 % — ABNORMAL LOW (ref 36.0–46.0)
Hemoglobin: 7.4 g/dL — ABNORMAL LOW (ref 12.0–15.0)
Immature Granulocytes: 0 %
Lymphocytes Relative: 25 %
Lymphs Abs: 1.9 10*3/uL (ref 0.7–4.0)
MCH: 18.7 pg — ABNORMAL LOW (ref 26.0–34.0)
MCHC: 29.1 g/dL — ABNORMAL LOW (ref 30.0–36.0)
MCV: 64.3 fL — ABNORMAL LOW (ref 80.0–100.0)
Monocytes Absolute: 0.8 10*3/uL (ref 0.1–1.0)
Monocytes Relative: 10 %
Neutro Abs: 4.7 10*3/uL (ref 1.7–7.7)
Neutrophils Relative %: 62 %
Platelets: 370 10*3/uL (ref 150–400)
RBC: 3.95 MIL/uL (ref 3.87–5.11)
RDW: 23.6 % — ABNORMAL HIGH (ref 11.5–15.5)
WBC: 7.7 10*3/uL (ref 4.0–10.5)
nRBC: 0 % (ref 0.0–0.2)

## 2022-03-09 LAB — PREPARE RBC (CROSSMATCH)

## 2022-03-09 LAB — RAD ONC ARIA SESSION SUMMARY
Course Elapsed Days: 0
Plan Fractions Treated to Date: 1
Plan Prescribed Dose Per Fraction: 1.8 Gy
Plan Total Fractions Prescribed: 30
Plan Total Prescribed Dose: 54 Gy
Reference Point Dosage Given to Date: 1.8 Gy
Reference Point Session Dosage Given: 1.8 Gy
Session Number: 1

## 2022-03-09 LAB — FERRITIN: Ferritin: 3 ng/mL — ABNORMAL LOW (ref 11–307)

## 2022-03-09 LAB — IRON AND IRON BINDING CAPACITY (CC-WL,HP ONLY)
Iron: 14 ug/dL — ABNORMAL LOW (ref 28–170)
Saturation Ratios: 2 % — ABNORMAL LOW (ref 10.4–31.8)
TIBC: 592 ug/dL — ABNORMAL HIGH (ref 250–450)
UIBC: 578 ug/dL — ABNORMAL HIGH (ref 148–442)

## 2022-03-09 MED ORDER — SODIUM CHLORIDE 0.9% IV SOLUTION
250.0000 mL | Freq: Once | INTRAVENOUS | Status: AC
  Administered 2022-03-09: 250 mL via INTRAVENOUS

## 2022-03-09 MED ORDER — ONDANSETRON HCL 8 MG PO TABS
8.0000 mg | ORAL_TABLET | Freq: Two times a day (BID) | ORAL | 1 refills | Status: DC | PRN
  Filled 2022-03-09: qty 30, 15d supply, fill #0

## 2022-03-09 MED ORDER — SODIUM CHLORIDE 0.9% IV SOLUTION: 250.0000 mL | Freq: Once | INTRAVENOUS | Status: AC

## 2022-03-09 MED ORDER — SODIUM CHLORIDE 0.9% FLUSH: 10.0000 mL | INTRAVENOUS | Status: DC | PRN

## 2022-03-09 MED ORDER — PROCHLORPERAZINE MALEATE 10 MG PO TABS
10.0000 mg | ORAL_TABLET | Freq: Once | ORAL | Status: AC
  Administered 2022-03-09: 10 mg via ORAL
  Filled 2022-03-09: qty 1

## 2022-03-09 MED ORDER — SODIUM CHLORIDE 0.9 % IV SOLN: Freq: Once | INTRAVENOUS | Status: AC

## 2022-03-09 MED ORDER — ACETAMINOPHEN 325 MG PO TABS
650.0000 mg | ORAL_TABLET | Freq: Once | ORAL | Status: AC
  Administered 2022-03-09: 650 mg via ORAL
  Filled 2022-03-09: qty 2

## 2022-03-09 MED ORDER — ACETAMINOPHEN 325 MG PO TABS: 650.0000 mg | ORAL_TABLET | Freq: Once | ORAL | Status: DC

## 2022-03-09 MED ORDER — PROCHLORPERAZINE MALEATE 10 MG PO TABS
10.0000 mg | ORAL_TABLET | Freq: Four times a day (QID) | ORAL | 1 refills | Status: DC | PRN
  Filled 2022-03-09: qty 30, 8d supply, fill #0

## 2022-03-09 MED ORDER — SODIUM CHLORIDE 0.9 % IV SOLN
1000.0000 mg/m2/d | INTRAVENOUS | Status: DC
  Administered 2022-03-09: 9850 mg via INTRAVENOUS
  Filled 2022-03-09: qty 197

## 2022-03-09 MED ORDER — DIPHENHYDRAMINE HCL 25 MG PO CAPS: 25.0000 mg | ORAL_CAPSULE | Freq: Once | ORAL | Status: DC

## 2022-03-09 MED ORDER — MITOMYCIN CHEMO IV INJECTION 40 MG
10.0000 mg/m2 | Freq: Once | INTRAVENOUS | Status: AC
  Administered 2022-03-09: 24.5 mg via INTRAVENOUS
  Filled 2022-03-09: qty 49

## 2022-03-09 MED ORDER — HEPARIN SOD (PORK) LOCK FLUSH 100 UNIT/ML IV SOLN
500.0000 [IU] | Freq: Once | INTRAVENOUS | Status: AC
  Administered 2022-03-09: 500 [IU] via INTRAVENOUS

## 2022-03-09 MED ORDER — DIPHENHYDRAMINE HCL 25 MG PO CAPS
25.0000 mg | ORAL_CAPSULE | Freq: Once | ORAL | Status: AC
  Administered 2022-03-09: 25 mg via ORAL
  Filled 2022-03-09: qty 1

## 2022-03-09 MED ORDER — LIDOCAINE HCL 1 % IJ SOLN
INTRAMUSCULAR | Status: AC
  Administered 2022-03-09: 10 mL via INTRADERMAL
  Filled 2022-03-09: qty 20

## 2022-03-09 NOTE — Progress Notes (Signed)
No dose capping of mitomycin or 5FU @ this time per MD Burr Medico.   Larene [place], PharmD

## 2022-03-09 NOTE — Progress Notes (Signed)
[facility] CSW Progress Note  Holiday representative met with patient to provide with a [employer].  Pt reports she checked with her case manager at [facility] and was informed she is not eligible for expanded coverage.  CSW requested pt provided contact details for case manager in order for CSW to follow up which pt agreed to do once she returns home.  If pt is determined to not be eligible for expanded Medicaid coverage she will need to apply for market place insurance which she was provided information on today.  CSW also provided pt w/ contact details for Stefanie Libel to apply for an [employer].  CSW to continue to provide supportive services as appropriate throughout duration of treatment.      Henriette Combs, LCSW

## 2022-03-09 NOTE — Procedures (Addendum)
Successful placement of single lumen PICC line to right brachial vein. Length 40 cm Tip at lower SVC/RA PICC capped No complications Ready for use.  EBL < 5 mL   Tyson Alias, AGNP 03/09/2022 9:19 AM

## 2022-03-09 NOTE — Progress Notes (Signed)
[facility]   Telephone:([phone] [phone]) [phone]   Clinic Follow up Note   Patient Care Team: Pcp, No as PCP - General [name], MD (Rheumatology) Royston Sinner Colin Benton, MD as Consulting Physician (Obstetrics and Gynecology) Truitt Merle, MD as Consulting Physician (Hematology and Oncology)  Date of Service:  03/09/2022  CHIEF COMPLAINT: f/u of anal cancer  CURRENT THERAPY:  Neoadjuvant concurrent chemoRT, starting 03/09/22  -mitomycin on day 1, 28  -5FU on days 1-5, 28-31  ASSESSMENT & PLAN:  Tammy Garrison is a 31 y.o. female with   1. Anal Cancer, cT2N0Mx, with hypermetabolic left axillary nodes, HPV(+) -presented with worsening rectal bleeding, began passing clots and was admitted 02/06/22 for anemia from blood loss. S/p hemorrhoidectomy on 02/08/22 by Dr. 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 -PET scan 02/26/22 showed: intense uptake to anus; no signs of solid organ or FDG-avid nodal metastasis within abdomen or pelvis; indeterminate FDG-avid left inguinal lymph nodes. -she established care with Dr. Dema Severin on 03/03/22. -she underwent PICC line placement this morning in anticipation of beginning concurrent chemoRT with mitomycin/5FU today, 03/09/22. She will receive mitomycin on day 1 and 5-FU on day 1-5, and repeat again on week 5.  Reviewed potential side effects and management again.  I called in Zofran and Compazine to [facility] today. -labs reviewed, hgb down to 7.4 today. We will plan for 1u pRBC transfusion, hold on IV iron due to work up for axillary lymphadenopathy (#2).  2. Left Axillary Lymphadenopathy -seen on staging PET scan 02/24/22, along with nonspecific mass within ventral chest wall. -[facility]able [facility] breast lump. B/l MM and [place] on 03/06/22 showed: at least 3 enlarged lymph nodes in left axilla;  benign sebaceous cyst within inner left breast, corresponding with uptake on PET. -biopsy of an abnormal lymph node was performed the same day. Path results are pending.   3. Rectal Bleeding, Pain, Constipation -she began passing blood clots in early 01/2022. -s/p hemorrhoidectomy, she reports she still has hematochezia and pain with BM. -she reports pain with/following BM will last for several hours after. She is unable to sit or lay flat and has to sit on one side.  -she is now on a daily miralax regimen. I called in [place] for her 02/23/22.   4. Anemia, from bleeding and iron deficiency -low hgb dating back to 04/2018, prior to that in 2017 was WNL. She has heavy periods, requiring pad/tampon change every 30 minutes. -previously on oral iron, received two doses IV Feraheme in 04/2019. -hgb was 6.9 on admission on 02/06/22. She was given 1u pRBC in the hospital. -hgb 7.4 today (03/09/22), we will plan to give 1u pRBC soon. Will hold IV iron while working up axillary lymphadenopathy.   5. Rheumatoid Arthritis -previously on methotrexate, has been off for at least 2 years due to lack of insurance coverage. -she reports issues with her left arm and legs. -she is now on medicaid; I recommend reconnecting with her rheumatologist   6. Social Support -currently unemployed, secondary to absence from her rectal symptoms. -she lives alone with two cats, no children. -she was recently approved for Medicaid for family-planning, she did not cover cancer treatment.  She has met our social worker Manuela Schwartz today.      PLAN:  -proceed with first mitomycin and 5FU today  -pump and PICC d/c 8/11 -start  radiation today and continue M-F -1u pRBC transfusion soon -PICC placement, lab, and mitomycin/5FU in 4 weeks -lab and f/u weekly during [facility]   No problem-specific Assessment & Plan notes found for this encounter.   SUMMARY OF ONCOLOGIC HISTORY: Oncology History  Anal cancer ([place])  02/08/2022 Pathology  Results   FINAL MICROSCOPIC DIAGNOSIS:   A. SOFT TISSUE, ANTERIOR MASS VS. HEMORRHOID, EXCISION:  Invasive squamous cell carcinoma, focally keratinizing and  well-differentiated.  Carcinoma is present at the margins of excision.   B. HEMORRHOID, INTERNAL, EXCISION:  Hemorrhoid without the overlying epithelium.  Separate fragment of benign squamous epithelium.  Negative for neoplasm.   ADDENDUM:  P16 immunostain is strongly positive suggestive of this lesion being the HPV related.    02/08/2022 Cancer Staging   Staging form: Anus, AJCC V9 - Clinical stage from 02/08/2022: Stage IIA (cT2, cN0, cM0) - Signed by Truitt Merle, MD on 02/23/2022 Stage prefix: Initial diagnosis   02/23/2022 Initial Diagnosis   Anal cancer ([place])   03/09/2022 -  Chemotherapy   Patient is on Treatment Plan : ANUS Mitomycin D1,28 / 5FU D1-4, 28-31 q32d        INTERVAL HISTORY:  Tammy Garrison is here for a follow up of anal cancer. She was last seen by me on 02/23/22. She presents to the clinic accompanied by her mother. She reports rectal pain/burning for several hours after BM. She explains she was previously constipated but had ~7 BM yesterday. She reports continued rectal bleeding.   All other systems were reviewed with the patient and are negative.  MEDICAL HISTORY:  Past Medical History:  Diagnosis Date   Bipolar 1 disorder ([place])    ETOH abuse    H/O self-harm    H/O suicide attempt    x 5 - last 05/2016 - overdose Ibuprofen   Marijuana abuse    Rheumatoid arthritis ([place])     SURGICAL HISTORY: Past Surgical History:  Procedure Laterality Date   HEMORRHOID SURGERY N/A 02/08/2022   Procedure: EXTERNAL AND INTERNAL HEMORRHOIDECTOMY;  Surgeon: Georganna Skeans, MD;  Location: [facility];  Service: General;  Laterality: N/A;    I have reviewed the social history and family history with the patient and they are unchanged from previous note.  ALLERGIES:  is allergic to coconut  flavor.  MEDICATIONS:  Current Outpatient Medications  Medication Sig Dispense Refill   acetaminophen (TYLENOL) 500 MG tablet Take 2 tablets (1,000 mg total) by mouth every 6 (six) hours as needed. (Patient not taking: Reported on 03/05/2022) 100 tablet 2   Ascorbic Acid (VITAMIN C PO) Take 1 tablet by mouth daily. (Patient not taking: Reported on 03/05/2022)     docusate sodium (COLACE) 100 MG capsule Take 1 capsule (100 mg total) by mouth 2 (two) times daily. (Patient not taking: Reported on 03/05/2022) 10 capsule 0   HYDROcodone-acetaminophen (NORCO/VICODIN) 5-325 MG tablet Take 1 tablet by mouth every 6 (six) hours as needed for moderate pain. (Patient not taking: Reported on 03/05/2022) 30 tablet 0   Multiple Vitamins-Minerals (HAIR/SKIN/NAILS) CAPS Take 1 capsule by mouth daily. (Patient not taking: Reported on 03/05/2022)     ondansetron (ZOFRAN) 8 MG tablet Take 1 tablet (8 mg total) by mouth 2 (two) times daily as needed (Nausea or vomiting). 30 tablet 1   polyethylene glycol (MIRALAX / GLYCOLAX) 17 g packet Take 17 g by mouth 2 (two) times daily. (Patient not taking: Reported on 03/05/2022) 14 each 0   prochlorperazine (COMPAZINE) 10 MG tablet Take 1  tablet (10 mg total) by mouth every 6 (six) hours as needed (Nausea or vomiting). 30 tablet 1   trolamine salicylate (ASPERCREME) 10 % cream Apply 1 application topically as needed for muscle pain. (Patient not taking: Reported on 03/05/2022)     VITAMIN D PO Take 1 capsule by mouth daily. (Patient not taking: Reported on 03/05/2022)     No current facility-administered medications for this visit.   Facility-Administered Medications Ordered in Other Visits  Medication Dose Route Frequency Provider Last Rate Last Admin   0.9 %  sodium chloride infusion (Manually program via Guardrails IV Fluids)  250 mL Intravenous Once Truitt Merle, MD       fluorouracil (ADRUCIL) 9,850 mg in sodium chloride 0.9 % 53 mL chemo infusion  1,000 mg/m2/day (Treatment Plan  Recorded) Intravenous 4 days Truitt Merle, MD   Infusion Verify at 03/09/22 1612    PHYSICAL EXAMINATION: ECOG PERFORMANCE STATUS: 1 - Symptomatic but completely ambulatory  There were no vitals filed for this visit. Wt Readings from Last 3 Encounters:  03/09/22 262 lb 8 oz (119.1 kg)  03/05/22 260 lb 8 oz (118.2 kg)  02/26/22 258 lb 8 oz (117.3 kg)     GENERAL:alert, no distress and comfortable SKIN: skin color normal, no rashes or significant lesions EYES: normal, Conjunctiva are pink and non-injected, sclera clear  NEURO: alert & oriented x 3 with fluent speech  LABORATORY DATA:  I have reviewed the data as listed    Latest Ref Rng & Units 03/09/2022    9:53 AM 02/23/2022    4:02 PM 02/09/2022    2:22 AM  CBC  WBC 4.0 - 10.5 K/uL 7.7  8.1  12.9   Hemoglobin 12.0 - 15.0 g/dL 7.4  8.1  7.8   Hematocrit 36.0 - 46.0 % 25.4  28.6  27.1   Platelets 150 - 400 K/uL 370  566  247         Latest Ref Rng & Units 03/09/2022    9:53 AM 02/23/2022    4:02 PM 02/08/2022    2:00 PM  CMP  Glucose 70 - 99 mg/dL 113  105  98   BUN 6 - 20 mg/dL _0 Creatinine 0.44 - 1.00 mg/dL 0.64  0.75  0.72   Sodium 135 - 145 mmol/L 136  137  134   Potassium 3.5 - 5.1 mmol/L 4.0  4.0  4.5   Chloride 98 - 111 mmol/L 105  106  104   CO2 22 - 32 mmol/L _1 Calcium 8.9 - 10.3 mg/dL 9.0  9.0  9.0   Total Protein 6.5 - 8.1 g/dL 8.0  7.6    Total Bilirubin 0.3 - 1.2 mg/dL 0.4  0.4    Alkaline Phos 38 - 126 U/L 59  61    AST 15 - 41 U/L 17  16    ALT 0 - 44 U/L 19  15        RADIOGRAPHIC STUDIES: I have personally reviewed the radiological images as listed and agreed with the findings in the report. IR PICC PLACEMENT LEFT >5 YRS INC IMG GUIDE  Result Date: 03/09/2022 INDICATION: Patient with history of anal cancer. Request for PICC line for short-term chemotherapy. EXAM: ULTRASOUND AND FLUOROSCOPIC GUIDED PICC LINE INSERTION MEDICATIONS: 1% lidocaine CONTRAST:  None FLUOROSCOPY TIME:  (7  mGy) COMPLICATIONS: None immediate. TECHNIQUE: The procedure, risks, benefits, and alternatives were explained to the patient and/or  patient's representative and informed written consent was obtained. The RIGHT upper extremity was prepped with chlorhexidine in a sterile fashion, and a sterile drape was applied covering the operative field. Maximum barrier sterile technique with sterile gowns and gloves were used for the procedure. A timeout was performed prior to the initiation of the procedure. Local anesthesia was provided with 1% lidocaine. After the overlying soft tissues were anesthetized with 1% lidocaine, a micropuncture kit was utilized to access the RIGHT brachial vein. Real-time ultrasound guidance was utilized for vascular access including the acquisition of a permanent ultrasound image documenting patency of the accessed vessel. A guidewire was advanced to the level of the superior caval-atrial junction for measurement purposes and the PICC line was cut to length. A peel-away sheath was placed and a 40 cm, 5 [place], single lumen was inserted to level of the superior caval-atrial junction. A post procedure spot fluoroscopic was obtained. The catheter easily aspirated and flushed and was secured in place. A dressing was placed. The patient tolerated the procedure well without immediate post procedural complication. FINDINGS: After catheter placement, the tip lies within the superior cavoatrial junction. The catheter aspirates and flushes normally and is ready for immediate use. IMPRESSION: Successful ultrasound and fluoroscopic guided placement of a RIGHT brachial vein approach, 40 cm, 5 Fr, single lumen PICC. The tip of the catheter is positioned within the superior cavoatrial junction. The PICC line is ready for immediate use. Read by: Narda Rutherford, AGNP-BC Electronically Signed   By: Michaelle Birks M.D.   On: 03/09/2022 09:52      Orders Placed This Encounter  Procedures   Iron and Iron Binding  Capacity ([facility] only)   Informed Consent Details: Physician/Practitioner Attestation; Transcribe to consent form and obtain patient signature    Order Specific Question:   Physician/Practitioner attestation of informed consent for blood and or blood product transfusion    Answer:   I, the physician/practitioner, attest that I have discussed with the patient the benefits, risks, side effects, alternatives, likelihood of achieving goals and potential problems during recovery for the procedure that I have provided informed consent.    Order Specific Question:   Product(s)    Answer:   All Product(s)   All questions were answered. The patient knows to call the clinic with any problems, questions or concerns. No barriers to learning was detected. The total time spent in the appointment was 30 minutes.     Truitt Merle, MD 03/09/2022   I, Wilburn Mylar, am acting as scribe for Truitt Merle, MD.   I have reviewed the above documentation for accuracy and completeness, and I agree with the above.

## 2022-03-09 NOTE — Progress Notes (Signed)
Nutrition follow up completed with patient during infusion for anal cancer.  Wt improved to 262 pounds 8 oz on Aug 7. This is improved from 258 pounds July 31.  Labs included Glucose 113.  Reports increased appetite. States she is eating better and acknowledges improved wt. Denies nutrition impact symptoms or questions.  Nutrition Diagnosis: Food and Nutrition Related Knowledge Deficit resolved.  Encouraged patient to continue increased protein, low fiber foods for wt maintenance. Recommended she contact RD with further questions. She has RD contact information. No further interventions needed at this time.  **Disclaimer: This note was dictated with voice recognition software. Similar sounding words can inadvertently be transcribed and this note may contain transcription errors which may not have been corrected upon publication of note.**

## 2022-03-09 NOTE — Patient Instructions (Addendum)
[facility] ONCOLOGY  Discharge Instructions: Thank you for choosing [facility] to provide your oncology and hematology care.   If you have a lab appointment with the [facility], please go directly to the [facility] and check in at the registration area.   Wear comfortable clothing and clothing appropriate for easy access to any Portacath or PICC line.   We strive to give you quality time with your provider. You may need to reschedule your appointment if you arrive late (15 or more minutes).  Arriving late affects you and other patients whose appointments are after yours.  Also, if you miss three or more appointments without notifying the office, you may be dismissed from the clinic at the provider's discretion.      For prescription refill requests, have your pharmacy contact our office and allow 72 hours for refills to be completed.    Today you received the following chemotherapy and/or immunotherapy agents: Mitomycin and fluorouracil      To help prevent nausea and vomiting after your treatment, we encourage you to take your nausea medication as directed.  BELOW ARE SYMPTOMS THAT SHOULD BE REPORTED IMMEDIATELY: *FEVER GREATER THAN 100.4 F (38 C) OR HIGHER *CHILLS OR SWEATING *NAUSEA AND VOMITING THAT IS NOT CONTROLLED WITH YOUR NAUSEA MEDICATION *UNUSUAL SHORTNESS OF BREATH *UNUSUAL BRUISING OR BLEEDING *URINARY PROBLEMS (pain or burning when urinating, or frequent urination) *BOWEL PROBLEMS (unusual diarrhea, constipation, pain near the anus) TENDERNESS IN MOUTH AND THROAT WITH OR WITHOUT PRESENCE OF ULCERS (sore throat, sores in mouth, or a toothache) UNUSUAL RASH, SWELLING OR PAIN  UNUSUAL VAGINAL DISCHARGE OR ITCHING   Items with * indicate a potential emergency and should be followed up as soon as possible or go to the Emergency Department if any problems should occur.  Please show the CHEMOTHERAPY ALERT CARD or IMMUNOTHERAPY ALERT  CARD at check-in to the Emergency Department and triage nurse.  Should you have questions after your visit or need to cancel or reschedule your appointment, please contact [facility]  Dept: [phone]  and follow the prompts.  Office hours are 8:00 a.m. to 4:30 p.m. Monday - Friday. Please note that voicemails left after 4:00 p.m. may not be returned until the following business day.  We are closed weekends and major holidays. You have access to a nurse at all times for urgent questions. Please call the main number to the clinic Dept: [phone] and follow the prompts.   For any non-urgent questions, you may also contact your provider using MyChart. We now offer e-Visits for anyone 36 and older to request care online for non-urgent symptoms. For details visit [email or web address].   Also download the MyChart app! Go to the app store, search "MyChart", open the app, select [facility], and log in with your MyChart username and password.  Masks are optional in the cancer centers. If you would like for your care team to wear a mask while they are taking care of you, please let them know. You may have one support person who is at least 29 years old accompany you for your appointments. Mitomycin Injection What is this medication? MITOMYCIN (mye toe MYE sin) treats stomach cancer and pancreatic cancer. It works by slowing down the growth of cancer cells. This medicine may be used for other purposes; ask your health care provider or pharmacist if you have questions. COMMON BRAND NAME(S): Mutamycin What should I tell my care team before I take  this medication? They need to know if you have any of these conditions: Bleeding disorders Infection, such as chickenpox, cold sores, herpes Low blood counts, such as low white cells, platelets, red blood cells Kidney disease An unusual or allergic reaction to mitomycin, other medications, foods, dyes, or  preservatives Pregnant or trying to get pregnant Breastfeeding How should I use this medication? This medication is injected into a vein. It is given by your care team in a hospital or clinic setting. Talk to your care team about the use of this medication in children. Special care may be needed. Overdosage: If you think you have taken too much of this medicine contact a poison control center or emergency room at once. NOTE: This medicine is only for you. Do not share this medicine with others. What if I miss a dose? Keep appointments for follow-up doses. It is important not to miss your dose. Call your care team if you are unable to keep an appointment. What may interact with this medication? Interactions are not expected. This list may not describe all possible interactions. Give your health care provider a list of all the medicines, herbs, non-prescription drugs, or dietary supplements you use. Also tell them if you smoke, drink alcohol, or use illegal drugs. Some items may interact with your medicine. What should I watch for while using this medication? Your condition will be monitored carefully while you are receiving this medication. You may need blood work while taking this medication. This medication may make you feel generally unwell. This is not uncommon as chemotherapy can affect healthy cells as well as cancer cells. Report any side effects. Continue your course of treatment even though you feel ill unless your care team tells you to stop. This medication may increase your risk of getting an infection. Call your care team for advice if you get a fever, chills, sore throat, or other symptoms of a cold or flu. Do not treat yourself. Try to avoid being around people who are sick. Avoid taking medications that contain aspirin, acetaminophen, ibuprofen, naproxen, or ketoprofen unless instructed by your care team. These medications may hide a fever. This medication may increase your risk to  bruise or bleed. Call your care team if you notice any unusual bleeding. Be careful brushing or flossing your teeth or using a toothpick because you may get an infection or bleed more easily. If you have any dental work done, tell your dentist you are receiving this medication. Talk to your care team if you may be pregnant. Serious birth defects can occur if you take this medication during pregnancy. Contraception is recommended while taking this medication. Your care team can help you find the option that works for you. Do not breastfeed while taking this medication. What side effects may I notice from receiving this medication? Side effects that you should report to your care team as soon as possible: Allergic reactions--skin rash, itching, hives, swelling of the face, lips, tongue, or throat Dry cough, shortness of breath or trouble breathing Infection--fever, chills, cough, sore throat, wounds that don't heal, pain or trouble when passing urine, general feeling of discomfort or being unwell Kidney injury--decrease in the amount of urine, swelling of the ankles, hands, or feet Low red blood cell level--unusual weakness or fatigue, dizziness, headache, trouble breathing Stomach pain, bloody diarrhea, pale skin, unusual weakness or fatigue, decrease in the amount of urine, which may be signs of hemolytic uremic syndrome Unusual bruising or bleeding Side effects that usually do not  require medical attention (report these to your care team if they continue or are bothersome): Diarrhea Hair loss Loss of appetite with weight loss Nausea Pain, redness, or swelling with sores inside the mouth or throat This list may not describe all possible side effects. Call your doctor for medical advice about side effects. You may report side effects to FDA at 1-800-FDA-1088. Where should I keep my medication? This medication is given in a hospital or clinic. It will not be stored at home. NOTE: This sheet is a  summary. It may not cover all possible information. If you have questions about this medicine, talk to your doctor, pharmacist, or health care provider.  2023 Elsevier/Gold Standard (2021-12-10 00:00:00) Fluorouracil Injection What is this medication? FLUOROURACIL (flure oh YOOR a sil) treats some types of cancer. It works by slowing down the growth of cancer cells. This medicine may be used for other purposes; ask your health care provider or pharmacist if you have questions. COMMON BRAND NAME(S): Adrucil What should I tell my care team before I take this medication? They need to know if you have any of these conditions: Blood disorders Dihydropyrimidine dehydrogenase (DPD) deficiency Infection, such as chickenpox, cold sores, herpes Kidney disease Liver disease Poor nutrition Recent or ongoing radiation therapy An unusual or allergic reaction to fluorouracil, other medications, foods, dyes, or preservatives If you or your partner are pregnant or trying to get pregnant Breast-feeding How should I use this medication? This medication is injected into a vein. It is administered by your care team in a hospital or clinic setting. Talk to your care team about the use of this medication in children. Special care may be needed. Overdosage: If you think you have taken too much of this medicine contact a poison control center or emergency room at once. NOTE: This medicine is only for you. Do not share this medicine with others. What if I miss a dose? Keep appointments for follow-up doses. It is important not to miss your dose. Call your care team if you are unable to keep an appointment. What may interact with this medication? Do not take this medication with any of the following: Live virus vaccines This medication may also interact with the following: Medications that treat or prevent blood clots, such as warfarin, enoxaparin, dalteparin This list may not describe all possible interactions.  Give your health care provider a list of all the medicines, herbs, non-prescription drugs, or dietary supplements you use. Also tell them if you smoke, drink alcohol, or use illegal drugs. Some items may interact with your medicine. What should I watch for while using this medication? Your condition will be monitored carefully while you are receiving this medication. This medication may make you feel generally unwell. This is not uncommon as chemotherapy can affect healthy cells as well as cancer cells. Report any side effects. Continue your course of treatment even though you feel ill unless your care team tells you to stop. In some cases, you may be given additional medications to help with side effects. Follow all directions for their use. This medication may increase your risk of getting an infection. Call your care team for advice if you get a fever, chills, sore throat, or other symptoms of a cold or flu. Do not treat yourself. Try to avoid being around people who are sick. This medication may increase your risk to bruise or bleed. Call your care team if you notice any unusual bleeding. Be careful brushing or flossing your teeth or  using a toothpick because you may get an infection or bleed more easily. If you have any dental work done, tell your dentist you are receiving this medication. Avoid taking medications that contain aspirin, acetaminophen, ibuprofen, naproxen, or ketoprofen unless instructed by your care team. These medications may hide a fever. Do not treat diarrhea with over the counter products. Contact your care team if you have diarrhea that lasts more than 2 days or if it is severe and watery. This medication can make you more sensitive to the sun. Keep out of the sun. If you cannot avoid being in the sun, wear protective clothing and sunscreen. Do not use sun lamps, tanning beds, or tanning booths. Talk to your care team if you or your partner wish to become pregnant or think you  might be pregnant. This medication can cause serious birth defects if taken during pregnancy and for 3 months after the last dose. A reliable form of contraception is recommended while taking this medication and for 3 months after the last dose. Talk to your care team about effective forms of contraception. Do not father a child while taking this medication and for 3 months after the last dose. Use a condom while having sex during this time period. Do not breastfeed while taking this medication. This medication may cause infertility. Talk to your care team if you are concerned about your fertility. What side effects may I notice from receiving this medication? Side effects that you should report to your care team as soon as possible: Allergic reactions--skin rash, itching, hives, swelling of the face, lips, tongue, or throat Heart attack--pain or tightness in the chest, shoulders, arms, or jaw, nausea, shortness of breath, cold or clammy skin, feeling faint or lightheaded Heart failure--shortness of breath, swelling of the ankles, feet, or hands, sudden weight gain, unusual weakness or fatigue Heart rhythm changes--fast or irregular heartbeat, dizziness, feeling faint or lightheaded, chest pain, trouble breathing High ammonia level--unusual weakness or fatigue, confusion, loss of appetite, nausea, vomiting, seizures Infection--fever, chills, cough, sore throat, wounds that don't heal, pain or trouble when passing urine, general feeling of discomfort or being unwell Low red blood cell level--unusual weakness or fatigue, dizziness, headache, trouble breathing Pain, tingling, or numbness in the hands or feet, muscle weakness, change in vision, confusion or trouble speaking, loss of balance or coordination, trouble walking, seizures Redness, swelling, and blistering of the skin over hands and feet Severe or prolonged diarrhea Unusual bruising or bleeding Side effects that usually do not require medical  attention (report to your care team if they continue or are bothersome): Dry skin Headache Increased tears Nausea Pain, redness, or swelling with sores inside the mouth or throat Sensitivity to light Vomiting This list may not describe all possible side effects. Call your doctor for medical advice about side effects. You may report side effects to FDA at 1-800-FDA-1088. Where should I keep my medication? This medication is given in a hospital or clinic. It will not be stored at home. NOTE: This sheet is a summary. It may not cover all possible information. If you have questions about this medicine, talk to your doctor, pharmacist, or health care provider.  2023 Elsevier/Gold Standard (2021-11-25 00:00:00)  Blood Transfusion, Adult, Care After The following information offers guidance on how to care for yourself after your procedure. Your health care provider may also give you more specific instructions. If you have problems or questions, contact your health care provider. What can I expect after the procedure? After  the procedure, it is common to have: Bruising and soreness where the IV was inserted. A headache. Follow these instructions at home: IV insertion site care     Follow instructions from your health care provider about how to take care of your IV insertion site. Make sure you: Wash your hands with soap and water for at least 20 seconds before and after you change your bandage (dressing). If soap and water are not available, use hand sanitizer. Change your dressing as told by your health care provider. Check your IV insertion site every day for signs of infection. Check for: Redness, swelling, or pain. Bleeding from the site. Warmth. Pus or a bad smell. General instructions Take over-the-counter and prescription medicines only as told by your health care provider. Rest as told by your health care provider. Return to your normal activities as told by your health care  provider. Keep all follow-up visits. Lab tests may need to be done at certain periods to recheck your blood counts. Contact a health care provider if: You have itching or red, swollen areas of skin (hives). You have a fever or chills. You have pain in the head, back, or chest. You feel anxious or you feel weak after doing your normal activities. You have redness, swelling, warmth, or pain around the IV insertion site. You have blood coming from the IV insertion site that does not stop with pressure. You have pus or a bad smell coming from your IV insertion site. If you received your blood transfusion in an outpatient setting, you will be told whom to contact to report any reactions. Get help right away if: You have symptoms of a serious allergic or immune system reaction, including: Trouble breathing or shortness of breath. Swelling of the face, feeling flushed, or widespread rash. Dark urine or blood in the urine. Fast heartbeat. These symptoms may be an emergency. Get help right away. Call 911. Do not wait to see if the symptoms will go away. Do not drive yourself to the hospital. Summary Bruising and soreness around the IV insertion site are common. Check your IV insertion site every day for signs of infection. Rest as told by your health care provider. Return to your normal activities as told by your health care provider. Get help right away for symptoms of a serious allergic or immune system reaction to the blood transfusion. This information is not intended to replace advice given to you by your health care provider. Make sure you discuss any questions you have with your health care provider. Document Revised: 10/17/2021 Document Reviewed: 10/17/2021 Elsevier Patient Education  [place].

## 2022-03-09 NOTE — Progress Notes (Signed)
Ok to proceed with hgb 7.4 per Dr Burr Medico. No iron today pending biopsy result

## 2022-03-10 ENCOUNTER — Other Ambulatory Visit: Payer: Self-pay

## 2022-03-10 ENCOUNTER — Ambulatory Visit
Admission: RE | Admit: 2022-03-10 | Discharge: 2022-03-10 | Disposition: A | Discharge status: Home / Self Care | Payer: No Typology Code available for payment source | Source: Ambulatory Visit | Attending: Radiation Oncology | Admitting: Radiation Oncology

## 2022-03-10 ENCOUNTER — Other Ambulatory Visit: Payer: Self-pay | Admitting: Hematology

## 2022-03-10 ENCOUNTER — Other Ambulatory Visit (HOSPITAL_COMMUNITY): Payer: Medicaid Other

## 2022-03-10 ENCOUNTER — Encounter: Payer: Self-pay | Admitting: Hematology

## 2022-03-10 ENCOUNTER — Encounter: Payer: Self-pay | Admitting: Licensed Clinical Social Worker

## 2022-03-10 DIAGNOSIS — C210 Malignant neoplasm of anus, unspecified: Secondary | ICD-10-CM

## 2022-03-10 LAB — RAD ONC ARIA SESSION SUMMARY
Course Elapsed Days: 1
Plan Fractions Treated to Date: 2
Plan Prescribed Dose Per Fraction: 1.8 Gy
Plan Total Fractions Prescribed: 30
Plan Total Prescribed Dose: 54 Gy
Reference Point Dosage Given to Date: 3.6 Gy
Reference Point Session Dosage Given: 1.8 Gy
Session Number: 2

## 2022-03-10 LAB — TYPE AND SCREEN
ABO/RH(D): AB POS
Antibody Screen: NEGATIVE
Unit division: 0

## 2022-03-10 LAB — BPAM RBC
Blood Product Expiration Date: 202308142359
ISSUE DATE / TIME: 202308071315
Unit Type and Rh: 6200

## 2022-03-10 NOTE — Progress Notes (Signed)
[facility] CSW Progress Note  Clinical Education officer, museum  spoke w/ pt's case worker through [employer] [phone])  to inquire about pt's coverage.  CSW informed pt will need to complete a new Medicaid application online and check that at present she is disabled and will need a retro review in order to expand her coverage to include cancer treatment.  CSW spoke w/ pt over the phone to instruct.  Pt to complete online application.  CSW to continue to provide supportive services throughout duration of treatment as appropriate.      Henriette Combs, LCSW

## 2022-03-10 NOTE — Addendum Note (Signed)
Addended by: Truitt Merle on: 03/10/2022 03:24 PM   Modules accepted: Orders

## 2022-03-11 ENCOUNTER — Other Ambulatory Visit: Payer: Self-pay

## 2022-03-11 ENCOUNTER — Ambulatory Visit
Admission: RE | Admit: 2022-03-11 | Discharge: 2022-03-11 | Disposition: A | Discharge status: Home / Self Care | Payer: No Typology Code available for payment source | Source: Ambulatory Visit | Attending: Radiation Oncology | Admitting: Radiation Oncology

## 2022-03-11 LAB — RAD ONC ARIA SESSION SUMMARY
Course Elapsed Days: 2
Plan Fractions Treated to Date: 1
Plan Prescribed Dose Per Fraction: 1.8 Gy
Plan Total Fractions Prescribed: 28
Plan Total Prescribed Dose: 50.4 Gy
Reference Point Dosage Given to Date: 5.4 Gy
Reference Point Session Dosage Given: 1.8 Gy
Session Number: 3

## 2022-03-12 ENCOUNTER — Other Ambulatory Visit: Payer: Self-pay

## 2022-03-12 ENCOUNTER — Ambulatory Visit
Admission: RE | Admit: 2022-03-12 | Discharge: 2022-03-12 | Disposition: A | Discharge status: Home / Self Care | Payer: No Typology Code available for payment source | Source: Ambulatory Visit | Attending: Radiation Oncology | Admitting: Radiation Oncology

## 2022-03-12 LAB — RAD ONC ARIA SESSION SUMMARY
Course Elapsed Days: 3
Plan Fractions Treated to Date: 2
Plan Prescribed Dose Per Fraction: 1.8 Gy
Plan Total Fractions Prescribed: 28
Plan Total Prescribed Dose: 50.4 Gy
Reference Point Dosage Given to Date: 7.2 Gy
Reference Point Session Dosage Given: 1.8 Gy
Session Number: 4

## 2022-03-13 ENCOUNTER — Ambulatory Visit
Admission: RE | Admit: 2022-03-13 | Discharge: 2022-03-13 | Disposition: A | Discharge status: Home / Self Care | Payer: No Typology Code available for payment source | Source: Ambulatory Visit | Attending: Radiation Oncology | Admitting: Radiation Oncology

## 2022-03-13 ENCOUNTER — Inpatient Hospital Stay (HOSPITAL_BASED_OUTPATIENT_CLINIC_OR_DEPARTMENT_OTHER): Payer: Self-pay | Admitting: Hematology

## 2022-03-13 ENCOUNTER — Encounter: Payer: Self-pay | Admitting: Hematology

## 2022-03-13 ENCOUNTER — Inpatient Hospital Stay: Payer: No Typology Code available for payment source

## 2022-03-13 ENCOUNTER — Other Ambulatory Visit (HOSPITAL_COMMUNITY): Payer: Self-pay

## 2022-03-13 ENCOUNTER — Other Ambulatory Visit: Payer: Self-pay

## 2022-03-13 VITALS — BP 138/86 | HR 92 | Temp 98.2°F | Resp 18

## 2022-03-13 VITALS — BP 116/66 | HR 104 | Temp 98.2°F | Resp 18 | Wt 259.0 lb

## 2022-03-13 DIAGNOSIS — E860 Dehydration: Secondary | ICD-10-CM

## 2022-03-13 DIAGNOSIS — D500 Iron deficiency anemia secondary to blood loss (chronic): Secondary | ICD-10-CM

## 2022-03-13 DIAGNOSIS — C210 Malignant neoplasm of anus, unspecified: Secondary | ICD-10-CM

## 2022-03-13 DIAGNOSIS — R112 Nausea with vomiting, unspecified: Secondary | ICD-10-CM | POA: Insufficient documentation

## 2022-03-13 LAB — RAD ONC ARIA SESSION SUMMARY
Course Elapsed Days: 4
Plan Fractions Treated to Date: 3
Plan Prescribed Dose Per Fraction: 1.8 Gy
Plan Total Fractions Prescribed: 28
Plan Total Prescribed Dose: 50.4 Gy
Reference Point Dosage Given to Date: 9 Gy
Reference Point Session Dosage Given: 1.8 Gy
Session Number: 5

## 2022-03-13 MED ORDER — DEXAMETHASONE 4 MG PO TABS
4.0000 mg | ORAL_TABLET | Freq: Every day | ORAL | 0 refills | Status: DC
  Filled 2022-03-13: qty 30, 30d supply, fill #0

## 2022-03-13 MED ORDER — ONDANSETRON 8 MG PO TBDP
8.0000 mg | ORAL_TABLET | Freq: Three times a day (TID) | ORAL | 1 refills | Status: DC | PRN
  Filled 2022-03-13: qty 20, 7d supply, fill #0

## 2022-03-13 MED ORDER — SODIUM CHLORIDE 0.9% FLUSH
10.0000 mL | INTRAVENOUS | Status: DC | PRN
  Administered 2022-03-13: 10 mL

## 2022-03-13 MED ORDER — PROMETHAZINE HCL 25 MG PO TABS
25.0000 mg | ORAL_TABLET | Freq: Four times a day (QID) | ORAL | 1 refills | Status: DC | PRN
  Filled 2022-03-13: qty 30, 8d supply, fill #0
  Filled 2022-04-07: qty 30, 8d supply, fill #1

## 2022-03-13 MED ORDER — SODIUM CHLORIDE 0.9 % IV SOLN: Freq: Once | INTRAVENOUS | Status: AC

## 2022-03-13 MED ORDER — HEPARIN SOD (PORK) LOCK FLUSH 100 UNIT/ML IV SOLN
250.0000 [IU] | Freq: Once | INTRAVENOUS | Status: AC | PRN
  Administered 2022-03-13: 250 [IU]

## 2022-03-13 MED ORDER — SODIUM CHLORIDE 0.9 % IV SOLN
510.0000 mg | Freq: Once | INTRAVENOUS | Status: AC
  Administered 2022-03-13: 510 mg via INTRAVENOUS
  Filled 2022-03-13: qty 17

## 2022-03-13 MED ORDER — SONAFINE EX EMUL
1.0000 | Freq: Once | CUTANEOUS | Status: AC
  Administered 2022-03-13: 1 via TOPICAL

## 2022-03-13 MED ORDER — ACETAMINOPHEN 325 MG PO TABS
650.0000 mg | ORAL_TABLET | Freq: Once | ORAL | Status: AC
  Administered 2022-03-13: 650 mg via ORAL

## 2022-03-13 MED ORDER — DIPHENHYDRAMINE HCL 25 MG PO CAPS
25.0000 mg | ORAL_CAPSULE | Freq: Once | ORAL | Status: AC
  Administered 2022-03-13: 25 mg via ORAL

## 2022-03-13 MED ORDER — SODIUM CHLORIDE 0.9 % IV SOLN
510.0000 mg | Freq: Once | INTRAVENOUS | Status: DC
  Filled 2022-03-13: qty 17

## 2022-03-13 NOTE — Progress Notes (Signed)
Received referral from provider regarding [facility] for patient to receive medication.  Contacted patient while in [place] to introduce myself as Arboriculturist and to offer available resources. Discussed one-time $1000 Radio broadcast assistant to assist with personal expenses while going through treatment. Advised what is needed and she may send via email. She verbalized understanding. Advised patient to stop by registration before leaving today to sign grant approval paperwork. Approving patient for medications at this time and I will contact her to discuss full grant expenses in detail once she receives the paperwork to follow along.  She has my card for any additional financial questions or concerns.   Returned message to provider approving funds to be used today for medications needed from [facility].

## 2022-03-13 NOTE — Patient Instructions (Signed)

## 2022-03-13 NOTE — Progress Notes (Signed)
Patient stopped by registration after her visit to sign grant paperwork. She was given a copy of the approval letter and expense sheet to go and get medicine.  She has my card to contact at earliest convenience to discuss other expenses in detail.

## 2022-03-13 NOTE — Progress Notes (Addendum)
[place]   [phone] Fax:([phone]   Clinic Follow up Note   Patient Care Team: Pcp, No as PCP - General [name], MD (Rheumatology) Royston Sinner Colin Benton, MD as Consulting Physician (Obstetrics and Gynecology) Truitt Merle, MD as Consulting Physician (Hematology and Oncology)  Date of Service:  03/13/2022  CHIEF COMPLAINT: chemo toxicities, f/u of anal cancer  CURRENT THERAPY:  Neoadjuvant concurrent chemoRT, starting 03/09/22             -mitomycin on day 1, 28             -5FU on days 1-5, 28-31  ASSESSMENT & PLAN:  Tammy Garrison is a 31 y.o. female with   1. Chemo Toxicities: Nausea with vomiting, headaches -she reports she developed intense nausea with vomiting 2-3 days ago, notes she was unable to keep down even water much for last 2 days   -she is here for IV iron today, we will give her IVF as well. -I will call in phenergan to replace her compazine, dexamethasone, and sublingual zofran. -IVF tomorrow and next Monday   2. Rectal Bleeding, Pain, Constipation -she began passing blood clots in early 01/2022. -s/p hemorrhoidectomy, she reports she still has hematochezia and pain with BM. -she reports pain with/following BM will last for several hours after. She is unable to sit or lay flat and has to sit on one side. I called in [place] for her 02/23/22 -she is now on a daily miralax regimen.  -she reports today she has not had a BM in 3 days. I advised her to use the colace she was previously prescribed.  3. Anal Cancer, cT2N0Mx, with hypermetabolic left axillary nodes, HPV(+) -presented with worsening rectal bleeding, began passing clots and was admitted 02/06/22 for anemia from blood loss. S/p hemorrhoidectomy on 02/08/22 by Dr. 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 -PET scan 02/26/22 showed: intense uptake to anus; no signs  of solid organ or FDG-avid nodal metastasis within abdomen or pelvis; indeterminate FDG-avid left inguinal lymph nodes. -she established care with Dr. Dema Severin on 03/03/22. -she began concurrent chemoRT with mitomycin/5FU on 03/09/22.   2. Left Axillary Lymphadenopathy -seen on staging PET scan 02/24/22, along with nonspecific mass within ventral chest wall. -[facility]. B/l MM and [place] on 03/06/22 showed: at least 3 enlarged lymph nodes in left axilla; benign sebaceous cyst within inner left breast, corresponding with uptake on PET. -biopsy of an abnormal lymph node was performed the same day. Path results were benign.   4. Anemia, from bleeding and iron deficiency -low hgb dating back to 04/2018, prior to that in 2017 was WNL. She has heavy periods, requiring pad/tampon change every 30 minutes. -previously on oral iron, received two doses IV Feraheme in 04/2019. -hgb was 6.9 on admission on 02/06/22. She was given 1u pRBC in the hospital. -she will receive IV iron today, 03/13/22   5. Rheumatoid Arthritis -previously on methotrexate, has been off for at least 2 years due to lack of insurance coverage. -she reports issues with her left arm and legs. -she is now on medicaid; I recommend reconnecting with her rheumatologist   6. Social Support -currently unemployed, secondary to absence from her rectal symptoms. -she lives alone with two cats, no children. -she was recently approved for Medicaid for family-planning, she did not cover cancer treatment.  She has met our social worker Manuela Schwartz today.      PLAN:  -proceed with IV iron and IVF today -I called in phenergan, sublingual zofran, and dexamethasone to [place] placement, lab, and mitomycin/5FU in 4 weeks -lab and f/u weekly during [facility], next on 8/14 -I messaged social work for her financial assistance    No problem-specific [place] notes found for this encounter.   SUMMARY OF  ONCOLOGIC HISTORY: Oncology History  Anal cancer ([place])  02/08/2022 Pathology Results   FINAL MICROSCOPIC DIAGNOSIS:   A. SOFT TISSUE, ANTERIOR MASS VS. HEMORRHOID, EXCISION:  Invasive squamous cell carcinoma, focally keratinizing and  well-differentiated.  Carcinoma is present at the margins of excision.   B. HEMORRHOID, INTERNAL, EXCISION:  Hemorrhoid without the overlying epithelium.  Separate fragment of benign squamous epithelium.  Negative for neoplasm.   ADDENDUM:  P16 immunostain is strongly positive suggestive of this lesion being the HPV related.    02/08/2022 Cancer Staging   Staging form: Anus, AJCC V9 - Clinical stage from 02/08/2022: Stage IIA (cT2, cN0, cM0) - Signed by Truitt Merle, MD on 02/23/2022 Stage prefix: Initial diagnosis   02/23/2022 Initial Diagnosis   Anal cancer ([place])   03/09/2022 -  Chemotherapy   Patient is on Treatment Plan : ANUS Mitomycin D1,28 / 5FU D1-4, 28-31 q32d        INTERVAL HISTORY:  Tammy Garrison is here for a follow up of anal cancer. She was last seen by me on 03/09/22. She was seen in the infusion area. She reports she developed severe nausea with vomiting about 2-3 days ago. She explains she has been unable to keep down anything, including water. She also reports she has not had a BM in 3 days. She notes she feels like she needs to go but can't.   All other systems were reviewed with the patient and are negative.  MEDICAL HISTORY:  Past Medical History:  Diagnosis Date   Bipolar 1 disorder ([place])    ETOH abuse    H/O self-harm    H/O suicide attempt    x 5 - last 05/2016 - overdose Ibuprofen   Marijuana abuse    Rheumatoid arthritis ([place])     SURGICAL HISTORY: Past Surgical History:  Procedure Laterality Date   HEMORRHOID SURGERY N/A 02/08/2022   Procedure: EXTERNAL AND INTERNAL HEMORRHOIDECTOMY;  Surgeon: Georganna Skeans, MD;  Location: [facility];  Service: General;  Laterality: N/A;    I have reviewed the social history  and family history with the patient and they are unchanged from previous note.  ALLERGIES:  is allergic to coconut flavor.  MEDICATIONS:  Current Outpatient Medications  Medication Sig Dispense Refill   dexamethasone (DECADRON) 4 MG tablet Take 1 tablet (4 mg total) by mouth daily. Take daily for up to 5 days after chemo. 30 tablet 0   ondansetron (ZOFRAN-ODT) 8 MG disintegrating tablet Take 1 tablet (8 mg total) by mouth every 8 (eight) hours as needed for nausea or vomiting. 20 tablet 1   promethazine (PHENERGAN) 25 MG tablet Take 1 tablet (25 mg total) by mouth every 6 (six) hours as needed for nausea or vomiting. 30 tablet 1   acetaminophen (TYLENOL) 500 MG tablet Take 2 tablets (1,000 mg total) by mouth every 6 (six) hours as needed. (Patient not taking: Reported on 03/05/2022) 100 tablet 2   Ascorbic Acid (VITAMIN C PO) Take 1 tablet by mouth daily. (Patient not taking: Reported on 03/05/2022)  docusate sodium (COLACE) 100 MG capsule Take 1 capsule (100 mg total) by mouth 2 (two) times daily. (Patient not taking: Reported on 03/05/2022) 10 capsule 0   HYDROcodone-acetaminophen (NORCO/VICODIN) 5-325 MG tablet Take 1 tablet by mouth every 6 (six) hours as needed for moderate pain. (Patient not taking: Reported on 03/05/2022) 30 tablet 0   Multiple Vitamins-Minerals (HAIR/SKIN/NAILS) CAPS Take 1 capsule by mouth daily. (Patient not taking: Reported on 03/05/2022)     ondansetron (ZOFRAN) 8 MG tablet Take 1 tablet (8 mg total) by mouth 2 (two) times daily as needed (Nausea or vomiting). 30 tablet 1   polyethylene glycol (MIRALAX / GLYCOLAX) 17 g packet Take 17 g by mouth 2 (two) times daily. (Patient not taking: Reported on 03/05/2022) 14 each 0   prochlorperazine (COMPAZINE) 10 MG tablet Take 1 tablet (10 mg total) by mouth every 6 (six) hours as needed (Nausea or vomiting). 30 tablet 1   trolamine salicylate (ASPERCREME) 10 % cream Apply 1 application topically as needed for muscle pain. (Patient not  taking: Reported on 03/05/2022)     VITAMIN D PO Take 1 capsule by mouth daily. (Patient not taking: Reported on 03/05/2022)     No current facility-administered medications for this visit.   Facility-Administered Medications Ordered in Other Visits  Medication Dose Route Frequency Provider Last Rate Last Admin   0.9 %  sodium chloride infusion (Manually program via Guardrails IV Fluids)  250 mL Intravenous Once Truitt Merle, MD       Sonafine emulsion 1 Application  1 Application Topical Once Hayden Pedro, PA-C        PHYSICAL EXAMINATION: ECOG PERFORMANCE STATUS: 2 - Symptomatic, <50% confined to bed  There were no vitals filed for this visit. Wt Readings from Last 3 Encounters:  03/13/22 259 lb (117.5 kg)  03/09/22 262 lb 8 oz (119.1 kg)  03/05/22 260 lb 8 oz (118.2 kg)     GENERAL:alert, no distress and comfortable SKIN: skin color normal, no rashes or significant lesions EYES: normal, Conjunctiva are pink and non-injected, sclera clear  NEURO: alert & oriented x 3 with fluent speech  LABORATORY DATA:  I have reviewed the data as listed    Latest Ref Rng & Units 03/09/2022    9:53 AM 02/23/2022    4:02 PM 02/09/2022    2:22 AM  CBC  WBC 4.0 - 10.5 K/uL 7.7  8.1  12.9   Hemoglobin 12.0 - 15.0 g/dL 7.4  8.1  7.8   Hematocrit 36.0 - 46.0 % 25.4  28.6  27.1   Platelets 150 - 400 K/uL 370  566  247         Latest Ref Rng & Units 03/09/2022    9:53 AM 02/23/2022    4:02 PM 02/08/2022    2:00 PM  CMP  Glucose 70 - 99 mg/dL 113  105  98   BUN 6 - 20 mg/dL $Remove'12  10  9   'GpspwSJ$ Creatinine 0.44 - 1.00 mg/dL 0.64  0.75  0.72   Sodium 135 - 145 mmol/L 136  137  134   Potassium 3.5 - 5.1 mmol/L 4.0  4.0  4.5   Chloride 98 - 111 mmol/L 105  106  104   CO2 22 - 32 mmol/L $RemoveB'27  25  23   'wwWtoEXI$ Calcium 8.9 - 10.3 mg/dL 9.0  9.0  9.0   Total Protein 6.5 - 8.1 g/dL 8.0  7.6    Total Bilirubin 0.3 - 1.2 mg/dL 0.4  0.4    Alkaline Phos 38 - 126 U/L 59  61    AST 15 - 41 U/L 17  16    ALT 0 - 44 U/L  19  15        RADIOGRAPHIC STUDIES: I have personally reviewed the radiological images as listed and agreed with the findings in the report. No results found.    No orders of the defined types were placed in this encounter.  All questions were answered. The patient knows to call the clinic with any problems, questions or concerns. No barriers to learning was detected. The total time spent in the appointment was 25 minutes.     Truitt Merle, MD 03/13/2022   I, Wilburn Mylar, am acting as scribe for Truitt Merle, MD.   I have reviewed the above documentation for accuracy and completeness, and I agree with the above.

## 2022-03-13 NOTE — Progress Notes (Signed)
Okay to bolus remaining 2.80m of 5FU pump per Dr FBurr Medico

## 2022-03-13 NOTE — Progress Notes (Signed)
Pt here for patient teaching.  Pt given Radiation and You booklet, skin care instructions, and Sonafine.  Reviewed areas of pertinence such as diarrhea, fatigue, hair loss, nausea and vomiting, sexual and fertility changes, skin changes, and urinary and bladder changes . Pt able to give teach back of to pat skin, use unscented/gentle soap, use baby wipes, have Imodium on hand, drink plenty of water, and sitz bath,apply Sonafine bid and avoid applying anything to skin within 4 hours of treatment. Pt verbalizes understanding of information given and will contact nursing with any questions or concerns.    Gloriajean Dell. Leonie Green, BSN

## 2022-03-13 NOTE — Progress Notes (Signed)
I met with Tammy Garrison in the infusion room and reviewed schedule changes with her.  All questions were answered.  She verbalized understanding.

## 2022-03-13 NOTE — Progress Notes (Signed)
Pt reports n/v and constipation. Dr. Burr Medico evaluated pt in the infusion. Pt is to receive IVF today, tomorrow and Monday. Pt requests for PICC to stay in until complettion of IVF on Monday. We left PIV in while she went to radiation appt today. She will return to infusion for IVF after radiation treatment

## 2022-03-14 ENCOUNTER — Inpatient Hospital Stay: Payer: No Typology Code available for payment source

## 2022-03-14 ENCOUNTER — Other Ambulatory Visit: Payer: Self-pay

## 2022-03-14 ENCOUNTER — Encounter: Payer: Self-pay | Admitting: Hematology

## 2022-03-14 VITALS — BP 148/101 | HR 109 | Temp 97.2°F | Resp 17

## 2022-03-14 DIAGNOSIS — D649 Anemia, unspecified: Secondary | ICD-10-CM

## 2022-03-14 DIAGNOSIS — E860 Dehydration: Secondary | ICD-10-CM

## 2022-03-14 DIAGNOSIS — C210 Malignant neoplasm of anus, unspecified: Secondary | ICD-10-CM

## 2022-03-14 DIAGNOSIS — Z452 Encounter for adjustment and management of vascular access device: Secondary | ICD-10-CM | POA: Insufficient documentation

## 2022-03-14 DIAGNOSIS — D500 Iron deficiency anemia secondary to blood loss (chronic): Secondary | ICD-10-CM

## 2022-03-14 DIAGNOSIS — R112 Nausea with vomiting, unspecified: Secondary | ICD-10-CM

## 2022-03-14 MED ORDER — SODIUM CHLORIDE 0.9% FLUSH
10.0000 mL | Freq: Once | INTRAVENOUS | Status: AC
  Administered 2022-03-14: 10 mL

## 2022-03-14 MED ORDER — SODIUM CHLORIDE 0.9 % IV SOLN: Freq: Once | INTRAVENOUS | Status: AC

## 2022-03-14 MED ORDER — HEPARIN SOD (PORK) LOCK FLUSH 100 UNIT/ML IV SOLN
250.0000 [IU] | Freq: Once | INTRAVENOUS | Status: AC
  Administered 2022-03-14: 250 [IU]

## 2022-03-14 MED ORDER — SODIUM CHLORIDE 0.9% IV SOLUTION
250.0000 mL | Freq: Once | INTRAVENOUS | Status: AC
  Administered 2022-03-14: 250 mL via INTRAVENOUS

## 2022-03-16 ENCOUNTER — Ambulatory Visit: Payer: Medicaid Other

## 2022-03-16 ENCOUNTER — Other Ambulatory Visit: Payer: Self-pay

## 2022-03-16 ENCOUNTER — Other Ambulatory Visit: Payer: No Typology Code available for payment source

## 2022-03-16 ENCOUNTER — Inpatient Hospital Stay: Payer: No Typology Code available for payment source

## 2022-03-16 ENCOUNTER — Inpatient Hospital Stay (HOSPITAL_BASED_OUTPATIENT_CLINIC_OR_DEPARTMENT_OTHER): Payer: Self-pay | Admitting: Hematology

## 2022-03-16 ENCOUNTER — Ambulatory Visit
Admission: RE | Admit: 2022-03-16 | Discharge: 2022-03-16 | Disposition: A | Discharge status: Home / Self Care | Payer: No Typology Code available for payment source | Source: Ambulatory Visit | Attending: Radiation Oncology | Admitting: Radiation Oncology

## 2022-03-16 ENCOUNTER — Encounter: Payer: Self-pay | Admitting: Hematology

## 2022-03-16 VITALS — BP 136/83 | HR 110 | Temp 97.8°F | Resp 17 | Ht 72.0 in | Wt 261.4 lb

## 2022-03-16 VITALS — BP 132/85 | HR 87 | Temp 98.1°F | Resp 16

## 2022-03-16 DIAGNOSIS — C210 Malignant neoplasm of anus, unspecified: Secondary | ICD-10-CM

## 2022-03-16 DIAGNOSIS — D500 Iron deficiency anemia secondary to blood loss (chronic): Secondary | ICD-10-CM

## 2022-03-16 DIAGNOSIS — E860 Dehydration: Secondary | ICD-10-CM

## 2022-03-16 DIAGNOSIS — Z452 Encounter for adjustment and management of vascular access device: Secondary | ICD-10-CM

## 2022-03-16 DIAGNOSIS — R112 Nausea with vomiting, unspecified: Secondary | ICD-10-CM

## 2022-03-16 LAB — CBC WITH DIFFERENTIAL/PLATELET
Abs Immature Granulocytes: 0.05 10*3/uL (ref 0.00–0.07)
Basophils Absolute: 0 10*3/uL (ref 0.0–0.1)
Basophils Relative: 1 %
Eosinophils Absolute: 0.2 10*3/uL (ref 0.0–0.5)
Eosinophils Relative: 2 %
HCT: 25.4 % — ABNORMAL LOW (ref 36.0–46.0)
Hemoglobin: 7.6 g/dL — ABNORMAL LOW (ref 12.0–15.0)
Immature Granulocytes: 1 %
Lymphocytes Relative: 9 %
Lymphs Abs: 0.7 10*3/uL (ref 0.7–4.0)
MCH: 19.6 pg — ABNORMAL LOW (ref 26.0–34.0)
MCHC: 29.9 g/dL — ABNORMAL LOW (ref 30.0–36.0)
MCV: 65.5 fL — ABNORMAL LOW (ref 80.0–100.0)
Monocytes Absolute: 0.2 10*3/uL (ref 0.1–1.0)
Monocytes Relative: 2 %
Neutro Abs: 6.8 10*3/uL (ref 1.7–7.7)
Neutrophils Relative %: 85 %
Platelets: 204 10*3/uL (ref 150–400)
RBC: 3.88 MIL/uL (ref 3.87–5.11)
RDW: 23.5 % — ABNORMAL HIGH (ref 11.5–15.5)
WBC: 8 10*3/uL (ref 4.0–10.5)
nRBC: 0 % (ref 0.0–0.2)

## 2022-03-16 LAB — COMPREHENSIVE METABOLIC PANEL
ALT: 16 U/L (ref 0–44)
AST: 16 U/L (ref 15–41)
Albumin: 4.1 g/dL (ref 3.5–5.0)
Alkaline Phosphatase: 64 U/L (ref 38–126)
Anion gap: 3 — ABNORMAL LOW (ref 5–15)
BUN: 11 mg/dL (ref 6–20)
CO2: 27 mmol/L (ref 22–32)
Calcium: 9 mg/dL (ref 8.9–10.3)
Chloride: 105 mmol/L (ref 98–111)
Creatinine, Ser: 0.64 mg/dL (ref 0.44–1.00)
GFR, Estimated: >60 mL/min (ref >60)
Glucose, Bld: 194 mg/dL — ABNORMAL HIGH (ref 70–99)
Potassium: 3.4 mmol/L — ABNORMAL LOW (ref 3.5–5.1)
Sodium: 135 mmol/L (ref 135–145)
Total Bilirubin: 0.5 mg/dL (ref 0.3–1.2)
Total Protein: 7.5 g/dL (ref 6.5–8.1)

## 2022-03-16 LAB — RAD ONC ARIA SESSION SUMMARY
Course Elapsed Days: 7
Plan Fractions Treated to Date: 4
Plan Prescribed Dose Per Fraction: 1.8 Gy
Plan Total Fractions Prescribed: 28
Plan Total Prescribed Dose: 50.4 Gy
Reference Point Dosage Given to Date: 10.8 Gy
Reference Point Session Dosage Given: 1.8 Gy
Session Number: 6

## 2022-03-16 LAB — PREPARE RBC (CROSSMATCH)

## 2022-03-16 MED ORDER — SODIUM CHLORIDE 0.9 % IV SOLN: Freq: Once | INTRAVENOUS | Status: AC

## 2022-03-16 MED ORDER — ACETAMINOPHEN 325 MG PO TABS
650.0000 mg | ORAL_TABLET | Freq: Once | ORAL | Status: AC
  Administered 2022-03-16: 650 mg via ORAL

## 2022-03-16 MED ORDER — DIPHENHYDRAMINE HCL 25 MG PO CAPS
ORAL_CAPSULE | ORAL | Status: DC
Start: 2022-03-16 — End: 2022-03-16
  Filled 2022-03-16: qty 1

## 2022-03-16 MED ORDER — DIPHENHYDRAMINE HCL 25 MG PO CAPS
25.0000 mg | ORAL_CAPSULE | Freq: Once | ORAL | Status: AC
  Administered 2022-03-16: 25 mg via ORAL

## 2022-03-16 MED ORDER — SODIUM CHLORIDE 0.9% IV SOLUTION
250.0000 mL | Freq: Once | INTRAVENOUS | Status: AC
  Administered 2022-03-16: 250 mL via INTRAVENOUS

## 2022-03-16 MED ORDER — SODIUM CHLORIDE 0.9% FLUSH
10.0000 mL | Freq: Once | INTRAVENOUS | Status: AC
  Administered 2022-03-16: 10 mL

## 2022-03-16 MED ORDER — ACETAMINOPHEN 325 MG PO TABS
ORAL_TABLET | ORAL | Status: AC
  Filled 2022-03-16: qty 2

## 2022-03-16 MED ORDER — HEPARIN SOD (PORK) LOCK FLUSH 100 UNIT/ML IV SOLN
250.0000 [IU] | Freq: Once | INTRAVENOUS | Status: AC
  Administered 2022-03-16: 250 [IU]

## 2022-03-16 NOTE — Progress Notes (Addendum)
[place]   Telephone:([phone]) [phone] Fax:(336) [phone]   Clinic Follow up Note   Patient Care Team: Pcp, No as PCP - General [name], MD (Rheumatology) Royston Sinner Colin Benton, MD as Consulting Physician (Obstetrics and Gynecology) Truitt Merle, MD as Consulting Physician (Hematology and Oncology)  Date of Service:  03/16/2022  CHIEF COMPLAINT: f/u of anal cancer  CURRENT THERAPY:  Neoadjuvant concurrent chemoRT, starting 03/09/22             -mitomycin on day 1, 28             -5FU on days 1-5, 28-31  ASSESSMENT & PLAN:  Tammy Garrison is a 31 y.o. female with   1. Anal Cancer, cT2N0Mx, with hypermetabolic left axillary nodes, HPV(+) -presented with worsening rectal bleeding, began passing clots and was admitted 02/06/22 for anemia from blood loss. S/p hemorrhoidectomy on 02/08/22 by Dr. 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 -PET scan 02/26/22 showed: intense uptake to anus; no signs of solid organ or FDG-avid nodal metastasis within abdomen or pelvis, hypermetabolic left axillary lymph nodes.  Sequently left axillary lymph node biopsy was benign. -she established care with Dr. Dema Severin on 03/03/22. -she began concurrent chemoRT with mitomycin/5FU on 03/09/22. She developed severe nausea with vomiting, as well as headaches. She was given IVF on 8/11 and 8/12 and will receive again today. She has recovered well today.  -she notes she is interested in keeping her PICC in place to avoid continued sticking; we reviewed that this involves daily flushing and dressing.   2. Chemo Toxicities: Nausea with vomiting, headaches -she reports she developed intense nausea with vomiting 2-3 days ago, notes she was unable to keep down even water much for last 2 days   -she received IVF 8/11, 8/12, 8/14 -I called in phenergan to replace her compazine, dexamethasone, and  sublingual zofran. -she has recovered well but does report continued headaches.   3. Rectal Bleeding, Pain, Constipation -she began passing blood clots in early 01/2022. -s/p hemorrhoidectomy, she reports she still has hematochezia and pain with BM. -she reports pain with/following BM will last for several hours after. She is unable to sit or lay flat and has to sit on one side. I called in [place] for her 02/23/22 -she is now on a daily miralax regimen and has colace. I advised her to increase her dose to have a BM daily.   4. Anemia, from bleeding and iron deficiency -low hgb dating back to 04/2018, prior to that in 2017 was WNL. She has heavy periods, requiring pad/tampon change every 30 minutes. -previously on oral iron, received two doses IV Feraheme in 04/2019. -hgb was 6.9 on admission on 02/06/22. She was given 1u pRBC in the hospital. -she received IV Feraheme on 03/13/22 and will receive one more dose this week  -her hgb remains low at 7.6 today. Will give 1u pRBC.   5. Rheumatoid Arthritis -previously on methotrexate, has been off for at least 2 years due to lack of insurance coverage. -she reports issues with her left arm and legs. -she is now on medicaid; I recommend reconnecting with her rheumatologist   6. Social Support -currently unemployed, secondary to absence from her rectal symptoms. -she lives alone with two cats, no children. -she was recently approved for Medicaid for family planning, which does not cover cancer treatment. -she asked about transportation today; I will  refer her.     PLAN:  -proceed with IVF and 1u blood transfusion today -continue daily radiation -referral for transportation services -IV Feraheme 8/18 -lab, PICC flush, and f/u 8/22 -will schedule her daily PICC line flushing    No problem-specific Assessment & Plan notes found for this encounter.   SUMMARY OF ONCOLOGIC HISTORY: Oncology History  Anal cancer ([place])  02/08/2022 Pathology Results    FINAL MICROSCOPIC DIAGNOSIS:   A. SOFT TISSUE, ANTERIOR MASS VS. HEMORRHOID, EXCISION:  Invasive squamous cell carcinoma, focally keratinizing and  well-differentiated.  Carcinoma is present at the margins of excision.   B. HEMORRHOID, INTERNAL, EXCISION:  Hemorrhoid without the overlying epithelium.  Separate fragment of benign squamous epithelium.  Negative for neoplasm.   ADDENDUM:  P16 immunostain is strongly positive suggestive of this lesion being the HPV related.    02/08/2022 Cancer Staging   Staging form: Anus, AJCC V9 - Clinical stage from 02/08/2022: Stage IIA (cT2, cN0, cM0) - Signed by Truitt Merle, MD on 02/23/2022 Stage prefix: Initial diagnosis   02/23/2022 Initial Diagnosis   Anal cancer ([place])   03/09/2022 -  Chemotherapy   Patient is on Treatment Plan : ANUS Mitomycin D1,28 / 5FU D1-4, 28-31 q32d        INTERVAL HISTORY:  Tammy Garrison is here for a follow up of anal cancer. She was last seen by me on 03/13/22. She presents to the clinic alone. She reports she is feeling much better today. She reports she is still having constipation and notes it took 3 days to have a BM on daily miralax.   All other systems were reviewed with the patient and are negative.  MEDICAL HISTORY:  Past Medical History:  Diagnosis Date   Bipolar 1 disorder ([place])    ETOH abuse    H/O self-harm    H/O suicide attempt    x 5 - last 05/2016 - overdose Ibuprofen   Marijuana abuse    Rheumatoid arthritis ([place])     SURGICAL HISTORY: Past Surgical History:  Procedure Laterality Date   HEMORRHOID SURGERY N/A 02/08/2022   Procedure: EXTERNAL AND INTERNAL HEMORRHOIDECTOMY;  Surgeon: Georganna Skeans, MD;  Location: [facility];  Service: General;  Laterality: N/A;    I have reviewed the social history and family history with the patient and they are unchanged from previous note.  ALLERGIES:  is allergic to coconut flavor.  MEDICATIONS:  Current Outpatient Medications  Medication  Sig Dispense Refill   acetaminophen (TYLENOL) 500 MG tablet Take 2 tablets (1,000 mg total) by mouth every 6 (six) hours as needed. (Patient not taking: Reported on 03/05/2022) 100 tablet 2   Ascorbic Acid (VITAMIN C PO) Take 1 tablet by mouth daily. (Patient not taking: Reported on 03/05/2022)     dexamethasone (DECADRON) 4 MG tablet Take 1 tablet (4 mg total) by mouth daily. Take daily for up to 5 days after chemo. 30 tablet 0   docusate sodium (COLACE) 100 MG capsule Take 1 capsule (100 mg total) by mouth 2 (two) times daily. (Patient not taking: Reported on 03/05/2022) 10 capsule 0   HYDROcodone-acetaminophen (NORCO/VICODIN) 5-325 MG tablet Take 1 tablet by mouth every 6 (six) hours as needed for moderate pain. (Patient not taking: Reported on 03/05/2022) 30 tablet 0   Multiple Vitamins-Minerals (HAIR/SKIN/NAILS) CAPS Take 1 capsule by mouth daily. (Patient not taking: Reported on 03/05/2022)     ondansetron (ZOFRAN) 8 MG tablet Take 1 tablet (8 mg total) by mouth 2 (two) times daily  as needed (Nausea or vomiting). 30 tablet 1   ondansetron (ZOFRAN-ODT) 8 MG disintegrating tablet Dissolve 1 tablet (8 mg total) by mouth every 8 (eight) hours as needed for nausea or vomiting. 20 tablet 1   polyethylene glycol (MIRALAX / GLYCOLAX) 17 g packet Take 17 g by mouth 2 (two) times daily. (Patient not taking: Reported on 03/05/2022) 14 each 0   prochlorperazine (COMPAZINE) 10 MG tablet Take 1 tablet (10 mg total) by mouth every 6 (six) hours as needed (Nausea or vomiting). 30 tablet 1   promethazine (PHENERGAN) 25 MG tablet Take 1 tablet (25 mg total) by mouth every 6 (six) hours as needed for nausea or vomiting. 30 tablet 1   trolamine salicylate (ASPERCREME) 10 % cream Apply 1 application topically as needed for muscle pain. (Patient not taking: Reported on 03/05/2022)     VITAMIN D PO Take 1 capsule by mouth daily. (Patient not taking: Reported on 03/05/2022)     No current facility-administered medications for this  visit.   Facility-Administered Medications Ordered in Other Visits  Medication Dose Route Frequency Provider Last Rate Last Admin   0.9 %  sodium chloride infusion (Manually program via Guardrails IV Fluids)  250 mL Intravenous Once Truitt Merle, MD        PHYSICAL EXAMINATION: ECOG PERFORMANCE STATUS: 1 - Symptomatic but completely ambulatory  Vitals:   03/16/22 1229  BP: 136/83  Pulse: (!) 110  Resp: 17  Temp: 97.8 F (36.6 C)  SpO2: 100%   Wt Readings from Last 3 Encounters:  03/16/22 261 lb 6.4 oz (118.6 kg)  03/13/22 259 lb (117.5 kg)  03/09/22 262 lb 8 oz (119.1 kg)     GENERAL:alert, no distress and comfortable SKIN: skin color normal, no rashes or significant lesions EYES: normal, Conjunctiva are pink and non-injected, sclera clear  NEURO: alert & oriented x 3 with fluent speech  LABORATORY DATA:  I have reviewed the data as listed    Latest Ref Rng & Units 03/16/2022   11:53 AM 03/09/2022    9:53 AM 02/23/2022    4:02 PM  CBC  WBC 4.0 - 10.5 K/uL 8.0  7.7  8.1   Hemoglobin 12.0 - 15.0 g/dL 7.6  7.4  8.1   Hematocrit 36.0 - 46.0 % 25.4  25.4  28.6   Platelets 150 - 400 K/uL 204  370  566         Latest Ref Rng & Units 03/16/2022   11:53 AM 03/09/2022    9:53 AM 02/23/2022    4:02 PM  CMP  Glucose 70 - 99 mg/dL 194  113  105   BUN 6 - 20 mg/dL '11  12  10   '$ Creatinine 0.44 - 1.00 mg/dL 0.64  0.64  0.75   Sodium 135 - 145 mmol/L 135  136  137   Potassium 3.5 - 5.1 mmol/L 3.4  4.0  4.0   Chloride 98 - 111 mmol/L 105  105  106   CO2 22 - 32 mmol/L '27  27  25   '$ Calcium 8.9 - 10.3 mg/dL 9.0  9.0  9.0   Total Protein 6.5 - 8.1 g/dL 7.5  8.0  7.6   Total Bilirubin 0.3 - 1.2 mg/dL 0.5  0.4  0.4   Alkaline Phos 38 - 126 U/L 64  59  61   AST 15 - 41 U/L '16  17  16   '$ ALT 0 - 44 U/L 16  19  15  RADIOGRAPHIC STUDIES: I have personally reviewed the radiological images as listed and agreed with the findings in the report. No results found.    No orders of the  defined types were placed in this encounter.  All questions were answered. The patient knows to call the clinic with any problems, questions or concerns. No barriers to learning was detected. The total time spent in the appointment was 30 minutes.     Truitt Merle, MD 03/16/2022   I, Wilburn Mylar, am acting as scribe for Truitt Merle, MD.   I have reviewed the above documentation for accuracy and completeness, and I agree with the above.

## 2022-03-16 NOTE — Progress Notes (Signed)
Spoke with Ubaldo Glassing in Blood Bank regarding T&S and Prepare orders.  Alexis confirmed orders and will prepare 1unit PRBCs today for transfusion today.

## 2022-03-16 NOTE — Therapy (Addendum)
OUTPATIENT PHYSICAL THERAPY FEMALE PELVIC EVALUATION   Patient Name: Tammy Garrison MRN: [id number] DOB:1991/05/05, 31 y.o., female Today's Date: 03/16/2022    03/17/2022   0928  PT Visits / Re-Eval   Visit Number 1  Number of Visits --  Date for PT Re-Evaluation 07/17/2022  Authorization   Authorization Type --  Authorization Time Period --  Authorization - Visit Number --  Authorization - Number of Visits --  Progress Note Due on Visit --  PT Time Calculation   PT Start Time 0830  PT Stop Time 0920  PT Time Calculation (min) 50 min  PT - End of Session   Equipment Utilized During Treatment --  Activity Tolerance Patient tolerated treatment well; No increased pain  Behavior During Therapy [facility] for tasks assessed/performed  Earlie Counts, PT 03/25/22 8:49 AM  Past Medical History:  Diagnosis Date   Bipolar 1 disorder ([place])    ETOH abuse    H/O self-harm    H/O suicide attempt    x 5 - last 05/2016 - overdose Ibuprofen   Marijuana abuse    Rheumatoid arthritis ([place])    Past Surgical History:  Procedure Laterality Date   HEMORRHOID SURGERY N/A 02/08/2022   Procedure: EXTERNAL AND INTERNAL HEMORRHOIDECTOMY;  Surgeon: Georganna Skeans, MD;  Location: [place];  Service: General;  Laterality: N/A;   Patient Active Problem List   Diagnosis Date Noted   PICC (peripherally inserted central catheter) in place 03/14/2022   Dehydration 03/13/2022   Nausea with vomiting 03/13/2022   Anal cancer ([place]) 02/23/2022   Bipolar 1 disorder ([place]) 02/23/2022   RA (rheumatoid arthritis) ([place]) 02/07/2022   Syncope 02/06/2022   ABLA (acute blood loss anemia) 02/06/2022   BRBPR (bright red blood per rectum) 02/06/2022   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  Rationale for Evaluation and Treatment Rehabilitation  ONSET DATE: 02/08/2022  SUBJECTIVE:                                                                                                                                                                                           SUBJECTIVE STATEMENT: Diagnosed with anal cancer on 02/08/2022. Patient radiation started 2 weeks ago and getting chemotherapy last week. My pelvis starting to hurt. I have pain when pass bowel movements. Tumor was removed.  Fluid intake: Yes: water.      PAIN:  Are you having  pain? Yes NPRS scale: 6/10 Pain location: Anterior  Pain type: burning and cramping Pain description: intermittent   Aggravating factors: sudden onset, difficulty with sitting Relieving factors: shower and lay on fan.   PRECAUTIONS: Other: anal cancer  WEIGHT BEARING RESTRICTIONS No  FALLS:  Has patient fallen in last 6 months? No  LIVING ENVIRONMENT: Lives with: lives alone  OCCUPATION: none  PLOF: Independent  PATIENT GOALS understand how to manage the pain, understand the changes the pelvis goes through and what to do  PERTINENT HISTORY:  Rheumatoid arthritis ([place]), anal cancer  BOWEL MOVEMENT Pain with bowel movement: Yes at level 7/10 Type of bowel movement:Type (Bristol Stool Scale) type 4 , Frequency every other day, and Strain Yes Fully empty rectum: No Leakage: No Pads: No Fiber supplement: Yes: miralax 2x/day  URINATION Pain with urination: No Fully empty bladder: Yes:   Stream: Strong Urgency: Yes: not able to hold the urine Frequency: 1.5 to 2 hours Leakage: Urge to void, Walking to the bathroom, Coughing, Sneezing, and Bending forward Pads: No  INTERCOURSE Pain with intercourse: Initial Penetration and During Penetration Ability to have vaginal penetration:  Yes: clitorus is sensitive Climax: no Marinoff Scale: 1/3     OBJECTIVE:   DIAGNOSTIC  FINDINGS:  none  PATIENT SURVEYS:  PFIQ-7 129; UIQ-7 43; CRAIQ-7 43; POPIQ-7 43  COGNITION:  Overall cognitive status: Within functional limits for tasks assessed     SENSATION:  Light touch: Appears intact  Proprioception: Appears intact                POSTURE: No Significant postural limitations   PELVIC ALIGNMENT:  LUMBARAROM/PROM  A/PROM A/PROM  eval  Flexion Decreased by 25%  Extension full  Right lateral flexion full  Left lateral flexion full  Right rotation full  Left rotation full   (Blank rows = not tested)  LOWER EXTREMITY ROM:  Passive ROM Right eval Left eval  Hip flexion 120 110  Hip extension 10 10  Hip abduction 20 25  Hip internal rotation 10 10  Hip external rotation 40 50   (Blank rows = not tested)  LOWER EXTREMITY MMT:  MMT Right eval Left eval  Hip flexion 4/5 4/5  Hip extension 4/5 4/5  Hip abduction 4/5 4/5  Hip internal rotation 4/5 4/5  Hip external rotation 4/5 4/5    PALPATION:   General                  External Perineal Exam tenderness located on the labia majora, labia minora, clitorus, and vulva                             Internal Pelvic Floor tenderness located in the introitus, only placed index finger in 1/4 inch to assess the strength  Patient confirms identification and approves PT to assess internal pelvic floor and treatment Yes  PELVIC MMT:   MMT eval  Vaginal 2/5 and circular but no lift holding for 5 sec  Internal Anal Sphincter   External Anal Sphincter   Puborectalis   Diastasis Recti   (Blank rows = not tested)        TONE: Increased due to pain  PROLAPSE: None noted  TODAY'S TREATMENT  EVAL Date: 03/17/2022 HEP established-see below    PATIENT EDUCATION:  Education details: Access Code: [email or web address]; toileting technique Person educated: Patient Education method: Explanation, Demonstration, Tactile cues, Verbal cues, and Handouts Education comprehension: verbalized understanding, returned  demonstration, verbal cues required, tactile cues required, and needs further education   HOME EXERCISE PROGRAM: 03/17/2022 Access Code: [email or web address] URL: [email or web address] Date: 03/17/2022 Prepared by: Earlie Counts  Exercises - Seated Pelvic Floor Contraction  - 3 x daily - 7 x weekly - 1 sets - 5 reps - 5 sec hold  ASSESSMENT:  CLINICAL IMPRESSION: Patient is a 31 y.o. female who was seen today for physical therapy evaluation and treatment for anal cancer. Patient was diagnosed wit anal cancer on 02/08/2022. She is presently having chemotherapy and radiation. Patient reports her pelvic pain is constant at level 6/10 with burning and cramping. She has pain at level 6/10 when having a bowel movement. Patient pain makes it difficulty to sit, walk, and participate with social activities. She also has Rheumatoid arthritis that affects her joints. Patient leaks urine with urge to void, walking to the bathroom, coughing, sneezing, and bending forward. Pelvic floor strength is 2/5 with circular contraction. She has to go to the bathroom every 1.5 to 2 hours and during the night. Patient has to strain to have a bowel movement and has one every other day. She does not feel like she fully empties her rectum. She has a vaginal dilator but is not ready to use it due to the pain with palpation along the labia minora and majora and vulvar area. Patient perineal skin looks good at this time. She limited hip ROM. Bilateral hip strength is 4/5. Patient has to get up and down from a chair slowly due to pain and when she moves in bed. Patient will benefit from skilled therapy to improve mobility, strength and how to manage the changes from radiation.    OBJECTIVE IMPAIRMENTS decreased activity tolerance, decreased coordination, decreased endurance, decreased ROM, decreased strength, increased fascial restrictions, and pain.   ACTIVITY LIMITATIONS sitting, standing, squatting, transfers, bed mobility,  continence, toileting, and locomotion level  PARTICIPATION LIMITATIONS: cleaning, laundry, interpersonal relationship, and community activity  PERSONAL FACTORS Fitness, Past/current experiences, Time since onset of injury/illness/exacerbation, and 3+ comorbidities:    Rheumatoid arthritis ([place]), anal cancer with present chemotherapy and radiation treatment are also affecting patient's functional outcome.   REHAB POTENTIAL: Excellent  CLINICAL DECISION MAKING: Evolving/moderate complexity  EVALUATION COMPLEXITY: Moderate   GOALS: Goals reviewed with patient? Yes  SHORT TERM GOALS: Target date: 04/14/2022  Patient understands hip stretching program to reduce the tightness that happens from radiation.  Baseline: not educated yet Goal status: INITIAL  2.  Patient understands correct toileting technique to reduce pain with bowel movements.  Baseline: not educated yet Goal status: INITIAL  3.  Patient understands how to perform diaphragmatic breathing to elongate the pelvic floor for a bowel movement.  Baseline: not educated yet Goal status: INITIAL  4.  Patient understands the importance of a walking program to manage endurance and balance.  Baseline: not educated yet Goal status: INITIAL  5.  Patient was educated on rectal health.  Baseline: not educated yet Goal status: INITIAL   LONG TERM GOALS: Target date: 07/17/2022   Patient independent with advanced HEP for core and hip strength.  Baseline: not educated yet Goal status: INITIAL  2.  Pelvic floor strength >/= 3/5 to reduce her urinary leakage >/= 80%.  Baseline: strength is 2/5 and leaks with urge to void, walking to the bathroom, coughing, sneezing, and bending forward. Goal status: INITIAL  3.  Patient reports her pain with bowel movements decreased >/= 2/10 due to healing of the tissue and correct relaxation of the  pelvic floor.  Baseline: pain level 6/10 Goal status: INITIAL  4.  Patient understands how to  use the vaginal dilators to keep the opening of the introitus after radiation.  Baseline: not educated yet.  Goal status: INITIAL    PLAN: PT FREQUENCY: 1x/week  PT DURATION: other: for 4 months up to 12 visitis.   PLANNED INTERVENTIONS: Therapeutic exercises, Therapeutic activity, Neuromuscular re-education, Balance training, Patient/Family education, Self Care, Biofeedback, and Manual therapy  PLAN FOR NEXT SESSION: review toileting, go over hip stretches, diaphragmatic breahting, skin care, walking program   Earlie Counts, PT 03/17/22 9:55 AM

## 2022-03-17 ENCOUNTER — Ambulatory Visit
Admission: RE | Admit: 2022-03-17 | Discharge: 2022-03-17 | Disposition: A | Discharge status: Home / Self Care | Payer: No Typology Code available for payment source | Source: Ambulatory Visit | Attending: Radiation Oncology | Admitting: Radiation Oncology

## 2022-03-17 ENCOUNTER — Other Ambulatory Visit: Payer: Self-pay

## 2022-03-17 ENCOUNTER — Encounter: Payer: No Typology Code available for payment source | Attending: Radiation Oncology | Admitting: Physical Therapy

## 2022-03-17 ENCOUNTER — Inpatient Hospital Stay: Payer: No Typology Code available for payment source

## 2022-03-17 ENCOUNTER — Encounter: Payer: Self-pay | Admitting: Physical Therapy

## 2022-03-17 ENCOUNTER — Ambulatory Visit: Payer: Medicaid Other | Admitting: Physical Therapy

## 2022-03-17 DIAGNOSIS — C210 Malignant neoplasm of anus, unspecified: Secondary | ICD-10-CM | POA: Insufficient documentation

## 2022-03-17 DIAGNOSIS — R102 Pelvic and perineal pain: Secondary | ICD-10-CM

## 2022-03-17 DIAGNOSIS — E860 Dehydration: Secondary | ICD-10-CM

## 2022-03-17 DIAGNOSIS — Z452 Encounter for adjustment and management of vascular access device: Secondary | ICD-10-CM

## 2022-03-17 DIAGNOSIS — M6281 Muscle weakness (generalized): Secondary | ICD-10-CM

## 2022-03-17 DIAGNOSIS — R112 Nausea with vomiting, unspecified: Secondary | ICD-10-CM

## 2022-03-17 DIAGNOSIS — M25652 Stiffness of left hip, not elsewhere classified: Secondary | ICD-10-CM

## 2022-03-17 DIAGNOSIS — D500 Iron deficiency anemia secondary to blood loss (chronic): Secondary | ICD-10-CM

## 2022-03-17 DIAGNOSIS — M25651 Stiffness of right hip, not elsewhere classified: Secondary | ICD-10-CM

## 2022-03-17 LAB — RAD ONC ARIA SESSION SUMMARY
Course Elapsed Days: 8
Plan Fractions Treated to Date: 5
Plan Prescribed Dose Per Fraction: 1.8 Gy
Plan Total Fractions Prescribed: 28
Plan Total Prescribed Dose: 50.4 Gy
Reference Point Dosage Given to Date: 12.6 Gy
Reference Point Session Dosage Given: 1.8 Gy
Session Number: 7

## 2022-03-17 LAB — BPAM RBC
Blood Product Expiration Date: 202309022359
ISSUE DATE / TIME: 202308141455
Unit Type and Rh: 6200

## 2022-03-17 LAB — TYPE AND SCREEN
ABO/RH(D): AB POS
Antibody Screen: NEGATIVE
Unit division: 0

## 2022-03-17 MED ORDER — SODIUM CHLORIDE 0.9% FLUSH
10.0000 mL | Freq: Once | INTRAVENOUS | Status: AC
  Administered 2022-03-17: 10 mL

## 2022-03-17 MED ORDER — HEPARIN SOD (PORK) LOCK FLUSH 100 UNIT/ML IV SOLN
500.0000 [IU] | Freq: Once | INTRAVENOUS | Status: AC
  Administered 2022-03-17: 500 [IU]

## 2022-03-17 NOTE — Patient Instructions (Signed)

## 2022-03-18 ENCOUNTER — Other Ambulatory Visit: Payer: Self-pay

## 2022-03-18 ENCOUNTER — Inpatient Hospital Stay: Payer: No Typology Code available for payment source

## 2022-03-18 ENCOUNTER — Ambulatory Visit
Admission: RE | Admit: 2022-03-18 | Discharge: 2022-03-18 | Disposition: A | Discharge status: Home / Self Care | Payer: No Typology Code available for payment source | Source: Ambulatory Visit | Attending: Radiation Oncology | Admitting: Radiation Oncology

## 2022-03-18 DIAGNOSIS — E860 Dehydration: Secondary | ICD-10-CM

## 2022-03-18 DIAGNOSIS — C210 Malignant neoplasm of anus, unspecified: Secondary | ICD-10-CM

## 2022-03-18 DIAGNOSIS — R112 Nausea with vomiting, unspecified: Secondary | ICD-10-CM

## 2022-03-18 DIAGNOSIS — Z452 Encounter for adjustment and management of vascular access device: Secondary | ICD-10-CM

## 2022-03-18 DIAGNOSIS — D500 Iron deficiency anemia secondary to blood loss (chronic): Secondary | ICD-10-CM

## 2022-03-18 LAB — RAD ONC ARIA SESSION SUMMARY
Course Elapsed Days: 9
Plan Fractions Treated to Date: 6
Plan Prescribed Dose Per Fraction: 1.8 Gy
Plan Total Fractions Prescribed: 28
Plan Total Prescribed Dose: 50.4 Gy
Reference Point Dosage Given to Date: 14.4 Gy
Reference Point Session Dosage Given: 1.8 Gy
Session Number: 8

## 2022-03-18 MED ORDER — ACETAMINOPHEN 325 MG PO TABS: 650.0000 mg | ORAL_TABLET | Freq: Once | ORAL | Status: DC

## 2022-03-18 MED ORDER — SODIUM CHLORIDE 0.9 % IV SOLN: Freq: Once | INTRAVENOUS | Status: DC

## 2022-03-18 MED ORDER — METHYLPREDNISOLONE SODIUM SUCC 125 MG IJ SOLR: 125.0000 mg | Freq: Once | INTRAMUSCULAR | Status: DC | PRN

## 2022-03-18 MED ORDER — EPINEPHRINE 0.3 MG/0.3ML IJ SOAJ: 0.3000 mg | Freq: Once | INTRAMUSCULAR | Status: DC | PRN

## 2022-03-18 MED ORDER — DIPHENHYDRAMINE HCL 25 MG PO CAPS: 25.0000 mg | ORAL_CAPSULE | Freq: Once | ORAL | Status: DC

## 2022-03-18 MED ORDER — HEPARIN SOD (PORK) LOCK FLUSH 100 UNIT/ML IV SOLN
250.0000 [IU] | Freq: Once | INTRAVENOUS | Status: AC
  Administered 2022-03-18: 250 [IU]

## 2022-03-18 MED ORDER — SODIUM CHLORIDE 0.9% FLUSH
10.0000 mL | Freq: Once | INTRAVENOUS | Status: AC
  Administered 2022-03-18: 10 mL

## 2022-03-18 MED ORDER — FAMOTIDINE IN NACL 20-0.9 MG/50ML-% IV SOLN: 20.0000 mg | Freq: Once | INTRAVENOUS | Status: DC | PRN

## 2022-03-18 MED ORDER — SODIUM CHLORIDE 0.9 % IV SOLN
510.0000 mg | Freq: Once | INTRAVENOUS | Status: DC
  Filled 2022-03-18: qty 17

## 2022-03-18 MED ORDER — SODIUM CHLORIDE 0.9 % IV SOLN: Freq: Once | INTRAVENOUS | Status: DC | PRN

## 2022-03-18 MED ORDER — DIPHENHYDRAMINE HCL 50 MG/ML IJ SOLN: 50.0000 mg | Freq: Once | INTRAMUSCULAR | Status: DC | PRN

## 2022-03-18 MED ORDER — ALBUTEROL SULFATE (2.5 MG/3ML) 0.083% IN NEBU: 2.5000 mg | INHALATION_SOLUTION | Freq: Once | RESPIRATORY_TRACT | Status: DC | PRN

## 2022-03-19 ENCOUNTER — Ambulatory Visit
Admission: RE | Admit: 2022-03-19 | Discharge: 2022-03-19 | Disposition: A | Discharge status: Home / Self Care | Payer: No Typology Code available for payment source | Source: Ambulatory Visit | Attending: Radiation Oncology | Admitting: Radiation Oncology

## 2022-03-19 ENCOUNTER — Other Ambulatory Visit: Payer: Self-pay

## 2022-03-19 ENCOUNTER — Inpatient Hospital Stay: Payer: No Typology Code available for payment source

## 2022-03-19 DIAGNOSIS — Z452 Encounter for adjustment and management of vascular access device: Secondary | ICD-10-CM

## 2022-03-19 DIAGNOSIS — D500 Iron deficiency anemia secondary to blood loss (chronic): Secondary | ICD-10-CM

## 2022-03-19 DIAGNOSIS — E860 Dehydration: Secondary | ICD-10-CM

## 2022-03-19 DIAGNOSIS — R112 Nausea with vomiting, unspecified: Secondary | ICD-10-CM

## 2022-03-19 LAB — RAD ONC ARIA SESSION SUMMARY
Course Elapsed Days: 10
Plan Fractions Treated to Date: 7
Plan Prescribed Dose Per Fraction: 1.8 Gy
Plan Total Fractions Prescribed: 28
Plan Total Prescribed Dose: 50.4 Gy
Reference Point Dosage Given to Date: 16.2 Gy
Reference Point Session Dosage Given: 1.8 Gy
Session Number: 9

## 2022-03-19 MED ORDER — HEPARIN SOD (PORK) LOCK FLUSH 100 UNIT/ML IV SOLN
250.0000 [IU] | Freq: Once | INTRAVENOUS | Status: AC
  Administered 2022-03-19: 250 [IU]

## 2022-03-19 MED ORDER — SODIUM CHLORIDE 0.9% FLUSH
10.0000 mL | Freq: Once | INTRAVENOUS | Status: AC
  Administered 2022-03-19: 10 mL

## 2022-03-20 ENCOUNTER — Ambulatory Visit
Admission: RE | Admit: 2022-03-20 | Discharge: 2022-03-20 | Disposition: A | Discharge status: Home / Self Care | Payer: No Typology Code available for payment source | Source: Ambulatory Visit | Attending: Radiation Oncology | Admitting: Radiation Oncology

## 2022-03-20 ENCOUNTER — Inpatient Hospital Stay: Payer: No Typology Code available for payment source

## 2022-03-20 ENCOUNTER — Other Ambulatory Visit: Payer: Self-pay

## 2022-03-20 ENCOUNTER — Other Ambulatory Visit: Payer: Self-pay | Admitting: Hematology

## 2022-03-20 VITALS — BP 148/94 | HR 100 | Temp 98.7°F | Resp 16

## 2022-03-20 DIAGNOSIS — E860 Dehydration: Secondary | ICD-10-CM

## 2022-03-20 DIAGNOSIS — R112 Nausea with vomiting, unspecified: Secondary | ICD-10-CM

## 2022-03-20 DIAGNOSIS — Z452 Encounter for adjustment and management of vascular access device: Secondary | ICD-10-CM

## 2022-03-20 DIAGNOSIS — C210 Malignant neoplasm of anus, unspecified: Secondary | ICD-10-CM

## 2022-03-20 DIAGNOSIS — D500 Iron deficiency anemia secondary to blood loss (chronic): Secondary | ICD-10-CM

## 2022-03-20 LAB — COMPREHENSIVE METABOLIC PANEL
ALT: 20 U/L (ref 0–44)
AST: 13 U/L — ABNORMAL LOW (ref 15–41)
Albumin: 3.9 g/dL (ref 3.5–5.0)
Alkaline Phosphatase: 67 U/L (ref 38–126)
Anion gap: 5 (ref 5–15)
BUN: 10 mg/dL (ref 6–20)
CO2: 27 mmol/L (ref 22–32)
Calcium: 9.3 mg/dL (ref 8.9–10.3)
Chloride: 105 mmol/L (ref 98–111)
Creatinine, Ser: 0.67 mg/dL (ref 0.44–1.00)
GFR, Estimated: >60 mL/min (ref >60)
Glucose, Bld: 190 mg/dL — ABNORMAL HIGH (ref 70–99)
Potassium: 3.7 mmol/L (ref 3.5–5.1)
Sodium: 137 mmol/L (ref 135–145)
Total Bilirubin: 0.3 mg/dL (ref 0.3–1.2)
Total Protein: 7 g/dL (ref 6.5–8.1)

## 2022-03-20 LAB — IRON AND IRON BINDING CAPACITY (CC-WL,HP ONLY)
Iron: 99 ug/dL (ref 28–170)
Saturation Ratios: 22 % (ref 10.4–31.8)
TIBC: 452 ug/dL — ABNORMAL HIGH (ref 250–450)
UIBC: 353 ug/dL (ref 148–442)

## 2022-03-20 LAB — RAD ONC ARIA SESSION SUMMARY
Course Elapsed Days: 11
Plan Fractions Treated to Date: 8
Plan Prescribed Dose Per Fraction: 1.8 Gy
Plan Total Fractions Prescribed: 28
Plan Total Prescribed Dose: 50.4 Gy
Reference Point Dosage Given to Date: 18 Gy
Reference Point Session Dosage Given: 1.8 Gy
Session Number: 10

## 2022-03-20 LAB — CBC WITH DIFFERENTIAL/PLATELET
Abs Immature Granulocytes: 0.09 10*3/uL — ABNORMAL HIGH (ref 0.00–0.07)
Basophils Absolute: 0 10*3/uL (ref 0.0–0.1)
Basophils Relative: 0 %
Eosinophils Absolute: 0 10*3/uL (ref 0.0–0.5)
Eosinophils Relative: 0 %
HCT: 28.8 % — ABNORMAL LOW (ref 36.0–46.0)
Hemoglobin: 8.8 g/dL — ABNORMAL LOW (ref 12.0–15.0)
Immature Granulocytes: 1 %
Lymphocytes Relative: 8 %
Lymphs Abs: 0.6 10*3/uL — ABNORMAL LOW (ref 0.7–4.0)
MCH: 21 pg — ABNORMAL LOW (ref 26.0–34.0)
MCHC: 30.6 g/dL (ref 30.0–36.0)
MCV: 68.7 fL — ABNORMAL LOW (ref 80.0–100.0)
Monocytes Absolute: 0.4 10*3/uL (ref 0.1–1.0)
Monocytes Relative: 5 %
Neutro Abs: 6.1 10*3/uL (ref 1.7–7.7)
Neutrophils Relative %: 86 %
Platelets: 114 10*3/uL — ABNORMAL LOW (ref 150–400)
RBC: 4.19 MIL/uL (ref 3.87–5.11)
RDW: 25.4 % — ABNORMAL HIGH (ref 11.5–15.5)
WBC: 7.1 10*3/uL (ref 4.0–10.5)
nRBC: 1.1 % — ABNORMAL HIGH (ref 0.0–0.2)

## 2022-03-20 LAB — FERRITIN: Ferritin: 256 ng/mL (ref 11–307)

## 2022-03-20 MED ORDER — HEPARIN SOD (PORK) LOCK FLUSH 100 UNIT/ML IV SOLN
250.0000 [IU] | Freq: Once | INTRAVENOUS | Status: AC
  Administered 2022-03-20: 250 [IU]

## 2022-03-20 MED ORDER — DIPHENHYDRAMINE HCL 25 MG PO CAPS
ORAL_CAPSULE | ORAL | Status: AC
  Filled 2022-03-20: qty 1

## 2022-03-20 MED ORDER — DIPHENHYDRAMINE HCL 25 MG PO CAPS: 25.0000 mg | ORAL_CAPSULE | Freq: Once | ORAL | Status: DC

## 2022-03-20 MED ORDER — SODIUM CHLORIDE 0.9 % IV SOLN
510.0000 mg | Freq: Once | INTRAVENOUS | Status: AC
  Administered 2022-03-20: 510 mg via INTRAVENOUS
  Filled 2022-03-20: qty 17

## 2022-03-20 MED ORDER — DIPHENHYDRAMINE HCL 25 MG PO CAPS
25.0000 mg | ORAL_CAPSULE | Freq: Once | ORAL | Status: AC
  Administered 2022-03-20: 25 mg via ORAL

## 2022-03-20 MED ORDER — SODIUM CHLORIDE 0.9% FLUSH
10.0000 mL | Freq: Once | INTRAVENOUS | Status: AC
  Administered 2022-03-20: 10 mL

## 2022-03-20 NOTE — Patient Instructions (Signed)

## 2022-03-23 ENCOUNTER — Other Ambulatory Visit: Payer: Self-pay | Admitting: Hematology

## 2022-03-23 ENCOUNTER — Inpatient Hospital Stay: Payer: No Typology Code available for payment source

## 2022-03-23 ENCOUNTER — Ambulatory Visit
Admission: RE | Admit: 2022-03-23 | Discharge: 2022-03-23 | Disposition: A | Discharge status: Home / Self Care | Payer: No Typology Code available for payment source | Source: Ambulatory Visit | Attending: Radiation Oncology | Admitting: Radiation Oncology

## 2022-03-23 ENCOUNTER — Ambulatory Visit: Payer: Medicaid Other

## 2022-03-23 ENCOUNTER — Other Ambulatory Visit: Payer: Self-pay

## 2022-03-23 DIAGNOSIS — R112 Nausea with vomiting, unspecified: Secondary | ICD-10-CM

## 2022-03-23 DIAGNOSIS — D500 Iron deficiency anemia secondary to blood loss (chronic): Secondary | ICD-10-CM

## 2022-03-23 DIAGNOSIS — E860 Dehydration: Secondary | ICD-10-CM

## 2022-03-23 DIAGNOSIS — Z452 Encounter for adjustment and management of vascular access device: Secondary | ICD-10-CM

## 2022-03-23 DIAGNOSIS — C210 Malignant neoplasm of anus, unspecified: Secondary | ICD-10-CM

## 2022-03-23 LAB — RAD ONC ARIA SESSION SUMMARY
Course Elapsed Days: 14
Plan Fractions Treated to Date: 9
Plan Prescribed Dose Per Fraction: 1.8 Gy
Plan Total Fractions Prescribed: 28
Plan Total Prescribed Dose: 50.4 Gy
Reference Point Dosage Given to Date: 19.8 Gy
Reference Point Session Dosage Given: 1.8 Gy
Session Number: 11

## 2022-03-23 MED ORDER — SODIUM CHLORIDE 0.9% FLUSH
10.0000 mL | Freq: Once | INTRAVENOUS | Status: AC
  Administered 2022-03-23: 10 mL

## 2022-03-23 MED ORDER — HEPARIN SOD (PORK) LOCK FLUSH 100 UNIT/ML IV SOLN
250.0000 [IU] | Freq: Once | INTRAVENOUS | Status: AC
  Administered 2022-03-23: 250 [IU]

## 2022-03-24 ENCOUNTER — Inpatient Hospital Stay: Payer: No Typology Code available for payment source

## 2022-03-24 ENCOUNTER — Other Ambulatory Visit: Payer: No Typology Code available for payment source

## 2022-03-24 ENCOUNTER — Other Ambulatory Visit: Payer: Self-pay

## 2022-03-24 ENCOUNTER — Encounter: Payer: Self-pay | Admitting: Hematology

## 2022-03-24 ENCOUNTER — Other Ambulatory Visit (HOSPITAL_COMMUNITY): Payer: Self-pay

## 2022-03-24 ENCOUNTER — Inpatient Hospital Stay (HOSPITAL_BASED_OUTPATIENT_CLINIC_OR_DEPARTMENT_OTHER): Payer: Self-pay | Admitting: Hematology

## 2022-03-24 ENCOUNTER — Ambulatory Visit
Admission: RE | Admit: 2022-03-24 | Discharge: 2022-03-24 | Disposition: A | Discharge status: Home / Self Care | Payer: No Typology Code available for payment source | Source: Ambulatory Visit | Attending: Radiation Oncology | Admitting: Radiation Oncology

## 2022-03-24 VITALS — BP 130/83 | HR 114 | Temp 98.7°F | Resp 18 | Ht 72.0 in | Wt 272.4 lb

## 2022-03-24 DIAGNOSIS — C210 Malignant neoplasm of anus, unspecified: Secondary | ICD-10-CM

## 2022-03-24 DIAGNOSIS — D500 Iron deficiency anemia secondary to blood loss (chronic): Secondary | ICD-10-CM

## 2022-03-24 DIAGNOSIS — E860 Dehydration: Secondary | ICD-10-CM

## 2022-03-24 DIAGNOSIS — R112 Nausea with vomiting, unspecified: Secondary | ICD-10-CM

## 2022-03-24 DIAGNOSIS — Z452 Encounter for adjustment and management of vascular access device: Secondary | ICD-10-CM

## 2022-03-24 LAB — CBC WITH DIFFERENTIAL/PLATELET
Abs Immature Granulocytes: 0.13 10*3/uL — ABNORMAL HIGH (ref 0.00–0.07)
Basophils Absolute: 0 10*3/uL (ref 0.0–0.1)
Basophils Relative: 0 %
Eosinophils Absolute: 0 10*3/uL (ref 0.0–0.5)
Eosinophils Relative: 0 %
HCT: 32.1 % — ABNORMAL LOW (ref 36.0–46.0)
Hemoglobin: 9.6 g/dL — ABNORMAL LOW (ref 12.0–15.0)
Immature Granulocytes: 2 %
Lymphocytes Relative: 4 %
Lymphs Abs: 0.4 10*3/uL — ABNORMAL LOW (ref 0.7–4.0)
MCH: 22.2 pg — ABNORMAL LOW (ref 26.0–34.0)
MCHC: 29.9 g/dL — ABNORMAL LOW (ref 30.0–36.0)
MCV: 74.3 fL — ABNORMAL LOW (ref 80.0–100.0)
Monocytes Absolute: 0.6 10*3/uL (ref 0.1–1.0)
Monocytes Relative: 7 %
Neutro Abs: 7.8 10*3/uL — ABNORMAL HIGH (ref 1.7–7.7)
Neutrophils Relative %: 87 %
Platelets: 76 10*3/uL — ABNORMAL LOW (ref 150–400)
RBC: 4.32 MIL/uL (ref 3.87–5.11)
RDW: 30.9 % — ABNORMAL HIGH (ref 11.5–15.5)
WBC: 8.9 10*3/uL (ref 4.0–10.5)
nRBC: 2.3 % — ABNORMAL HIGH (ref 0.0–0.2)

## 2022-03-24 LAB — RAD ONC ARIA SESSION SUMMARY
Course Elapsed Days: 15
Plan Fractions Treated to Date: 10
Plan Prescribed Dose Per Fraction: 1.8 Gy
Plan Total Fractions Prescribed: 28
Plan Total Prescribed Dose: 50.4 Gy
Reference Point Dosage Given to Date: 21.6 Gy
Reference Point Session Dosage Given: 1.8 Gy
Session Number: 12

## 2022-03-24 LAB — COMPREHENSIVE METABOLIC PANEL
ALT: 33 U/L (ref 0–44)
AST: 19 U/L (ref 15–41)
Albumin: 4 g/dL (ref 3.5–5.0)
Alkaline Phosphatase: 58 U/L (ref 38–126)
Anion gap: 5 (ref 5–15)
BUN: 15 mg/dL (ref 6–20)
CO2: 28 mmol/L (ref 22–32)
Calcium: 9.3 mg/dL (ref 8.9–10.3)
Chloride: 104 mmol/L (ref 98–111)
Creatinine, Ser: 0.77 mg/dL (ref 0.44–1.00)
GFR, Estimated: >60 mL/min (ref >60)
Glucose, Bld: 165 mg/dL — ABNORMAL HIGH (ref 70–99)
Potassium: 3.9 mmol/L (ref 3.5–5.1)
Sodium: 137 mmol/L (ref 135–145)
Total Bilirubin: 0.5 mg/dL (ref 0.3–1.2)
Total Protein: 7.1 g/dL (ref 6.5–8.1)

## 2022-03-24 MED ORDER — SODIUM CHLORIDE 0.9% FLUSH
10.0000 mL | Freq: Once | INTRAVENOUS | Status: AC
  Administered 2022-03-24: 10 mL

## 2022-03-24 MED ORDER — HEPARIN SOD (PORK) LOCK FLUSH 100 UNIT/ML IV SOLN
250.0000 [IU] | Freq: Once | INTRAVENOUS | Status: AC
  Administered 2022-03-24: 250 [IU]

## 2022-03-24 MED ORDER — ACETAMINOPHEN-CODEINE 300-30 MG PO TABS
1.0000 | ORAL_TABLET | Freq: Four times a day (QID) | ORAL | 0 refills | Status: DC | PRN
  Filled 2022-03-24: qty 30, 8d supply, fill #0

## 2022-03-24 NOTE — Progress Notes (Signed)
[place]   Telephone:(336) [phone] Fax:(336) [phone]   Clinic Follow up Note   Patient Care Team: Pcp, No as PCP - General [name], MD (Rheumatology) Royston Sinner Colin Benton, MD as Consulting Physician (Obstetrics and Gynecology) Truitt Merle, MD as Consulting Physician (Hematology and Oncology)  Date of Service:  03/24/2022  CHIEF COMPLAINT: f/u of anal cancer  CURRENT THERAPY:  Neoadjuvant concurrent chemoRT, starting 03/09/22             -mitomycin on day 1, 28             -5FU on days 1-5, 28-31  ASSESSMENT & PLAN:  Tammy Garrison is a 31 y.o. female with   1. Anal Cancer, cT2N0Mx, with hypermetabolic left axillary nodes, HPV(+) -presented with worsening rectal bleeding, began passing clots and was admitted 02/06/22 for anemia from blood loss. S/p hemorrhoidectomy on 02/08/22 by Dr. 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 -PET scan 02/26/22 showed: intense uptake to anus; no signs of solid organ or FDG-avid nodal metastasis within abdomen or pelvis, hypermetabolic left axillary lymph nodes.  Sequently left axillary lymph node biopsy was benign. -she established care with Dr. Dema Severin on 03/03/22. -she began concurrent chemoRT with mitomycin/5FU on 03/09/22. She developed severe nausea with vomiting, as well as headaches. She was given IVF on three days last week and recovered well. -she is keeping her PICC line in place and has it flushed daily when she comes in for radiation.   2. Chemo Toxicities: Rectal irritation, Night sweats -she has developed irritation from radiation. She did not tolerate Norco with nausea and vomiting. I called in tylenol #3 for her to try. -she reports she has developed drenching night sweats and hot flashes. I explained that chemo has suppressed her ovarian function, and she is likely going through temporary menopause. I encouraged  her to check her temperature to ensure she doesn't have a fever.   3. Rectal Bleeding, Pain, Constipation -she began passing blood clots in early 01/2022. -s/p hemorrhoidectomy -she is now on a daily miralax regimen and has colace.    4. Anemia, from bleeding and iron deficiency -low hgb dating back to 04/2018, prior to that in 2017 was WNL. She has heavy periods, requiring pad/tampon change every 30 minutes. -she has required IV Feraheme and pRBC. -hgb 9.6 today (03/24/22)   5. Rheumatoid Arthritis -previously on methotrexate, has been off for at least 2 years due to lack of insurance coverage.   6. Social Support -currently unemployed, secondary to absence from her rectal symptoms. -she lives alone with two cats, no children. -previously referred for transportation services.     PLAN:  -continue daily radiation and PICC flush -I called in Tylenol 3 for pain, she does not tolerate hydrocodone well -lab and f/u 8/28 -lab, f/u, and 5FU/mitomycin on 9/5   No problem-specific Assessment & Plan notes found for this encounter.   SUMMARY OF ONCOLOGIC HISTORY: Oncology History  Anal cancer ([place])  02/08/2022 Pathology Results   FINAL MICROSCOPIC DIAGNOSIS:   A. SOFT TISSUE, ANTERIOR MASS VS. HEMORRHOID, EXCISION:  Invasive squamous cell carcinoma, focally keratinizing and  well-differentiated.  Carcinoma is present at the margins of excision.   B. HEMORRHOID, INTERNAL, EXCISION:  Hemorrhoid without the overlying epithelium.  Separate fragment of benign squamous epithelium.  Negative for neoplasm.   ADDENDUM:  P16 immunostain is strongly positive suggestive of this  lesion being the HPV related.    02/08/2022 Cancer Staging   Staging form: Anus, AJCC V9 - Clinical stage from 02/08/2022: Stage IIA (cT2, cN0, cM0) - Signed by Truitt Merle, MD on 02/23/2022 Stage prefix: Initial diagnosis   02/23/2022 Initial Diagnosis   Anal cancer ([place])   03/09/2022 - 03/13/2022 Chemotherapy   Patient  is on Treatment Plan : ANUS Mitomycin D1,28 / 5FU D1-4, 28-31 q32d     03/09/2022 -  Chemotherapy   Patient is on Treatment Plan : ANUS Mitomycin D1,28 + 5FU D1-4, 28-31 q32d        INTERVAL HISTORY:  Tammy Garrison is here for a follow up of anal cancer. She was last seen by me on 03/16/22. She presents to the clinic alone. She reports she has started experiencing side effects from radiation therapy, mainly rectal irritation. She explains the pain worsens with walking. She notes she is having 4-5 BM a day. She also reports skin breakdown. She reports she is having drenching night sweats and hot flashes.    All other systems were reviewed with the patient and are negative.  MEDICAL HISTORY:  Past Medical History:  Diagnosis Date   Bipolar 1 disorder ([place])    ETOH abuse    H/O self-harm    H/O suicide attempt    x 5 - last 05/2016 - overdose Ibuprofen   Marijuana abuse    Rheumatoid arthritis ([place])     SURGICAL HISTORY: Past Surgical History:  Procedure Laterality Date   HEMORRHOID SURGERY N/A 02/08/2022   Procedure: EXTERNAL AND INTERNAL HEMORRHOIDECTOMY;  Surgeon: Georganna Skeans, MD;  Location: [facility];  Service: General;  Laterality: N/A;    I have reviewed the social history and family history with the patient and they are unchanged from previous note.  ALLERGIES:  is allergic to coconut flavor.  MEDICATIONS:  Current Outpatient Medications  Medication Sig Dispense Refill   acetaminophen-codeine (TYLENOL #3) 300-30 MG tablet Take 1 tablet by mouth every 6 hours as needed for moderate pain. 30 tablet 0   acetaminophen (TYLENOL) 500 MG tablet Take 2 tablets (1,000 mg total) by mouth every 6 (six) hours as needed. (Patient not taking: Reported on 03/05/2022) 100 tablet 2   Ascorbic Acid (VITAMIN C PO) Take 1 tablet by mouth daily. (Patient not taking: Reported on 03/05/2022)     dexamethasone (DECADRON) 4 MG tablet Take 1 tablet (4 mg total) by mouth daily. Take daily for  up to 5 days after chemo. 30 tablet 0   docusate sodium (COLACE) 100 MG capsule Take 1 capsule (100 mg total) by mouth 2 (two) times daily. (Patient not taking: Reported on 03/05/2022) 10 capsule 0   HYDROcodone-acetaminophen (NORCO/VICODIN) 5-325 MG tablet Take 1 tablet by mouth every 6 (six) hours as needed for moderate pain. (Patient not taking: Reported on 03/05/2022) 30 tablet 0   Multiple Vitamins-Minerals (HAIR/SKIN/NAILS) CAPS Take 1 capsule by mouth daily. (Patient not taking: Reported on 03/05/2022)     ondansetron (ZOFRAN-ODT) 8 MG disintegrating tablet Dissolve 1 tablet (8 mg total) by mouth every 8 (eight) hours as needed for nausea or vomiting. 20 tablet 1   polyethylene glycol (MIRALAX / GLYCOLAX) 17 g packet Take 17 g by mouth 2 (two) times daily. (Patient not taking: Reported on 03/05/2022) 14 each 0   promethazine (PHENERGAN) 25 MG tablet Take 1 tablet (25 mg total) by mouth every 6 (six) hours as needed for nausea or vomiting. 30 tablet 1   trolamine salicylate (  ASPERCREME) 10 % cream Apply 1 application topically as needed for muscle pain. (Patient not taking: Reported on 03/05/2022)     VITAMIN D PO Take 1 capsule by mouth daily. (Patient not taking: Reported on 03/05/2022)     No current facility-administered medications for this visit.   Facility-Administered Medications Ordered in Other Visits  Medication Dose Route Frequency Provider Last Rate Last Admin   0.9 %  sodium chloride infusion (Manually program via Guardrails IV Fluids)  250 mL Intravenous Once Truitt Merle, MD        PHYSICAL EXAMINATION: ECOG PERFORMANCE STATUS: 2 - Symptomatic, <50% confined to bed  Vitals:   03/24/22 1030  BP: 130/83  Pulse: (!) 114  Resp: 18  Temp: 98.7 F (37.1 C)  SpO2: 97%   Wt Readings from Last 3 Encounters:  03/24/22 272 lb 6.4 oz (123.6 kg)  03/16/22 261 lb 6.4 oz (118.6 kg)  03/13/22 259 lb (117.5 kg)     GENERAL:alert, no distress and comfortable SKIN: skin color normal, no  rashes or significant lesions EYES: normal, Conjunctiva are pink and non-injected, sclera clear  NEURO: alert & oriented x 3 with fluent speech  LABORATORY DATA:  I have reviewed the data as listed    Latest Ref Rng & Units 03/24/2022   10:13 AM 03/20/2022    9:22 AM 03/16/2022   11:53 AM  CBC  WBC 4.0 - 10.5 K/uL 8.9  7.1  8.0   Hemoglobin 12.0 - 15.0 g/dL 9.6  8.8  7.6   Hematocrit 36.0 - 46.0 % 32.1  28.8  25.4   Platelets 150 - 400 K/uL 76  114  204         Latest Ref Rng & Units 03/24/2022   10:13 AM 03/20/2022    9:22 AM 03/16/2022   11:53 AM  CMP  Glucose 70 - 99 mg/dL 165  190  194   BUN 6 - 20 mg/dL '15  10  11   '$ Creatinine 0.44 - 1.00 mg/dL 0.77  0.67  0.64   Sodium 135 - 145 mmol/L 137  137  135   Potassium 3.5 - 5.1 mmol/L 3.9  3.7  3.4   Chloride 98 - 111 mmol/L 104  105  105   CO2 22 - 32 mmol/L '28  27  27   '$ Calcium 8.9 - 10.3 mg/dL 9.3  9.3  9.0   Total Protein 6.5 - 8.1 g/dL 7.1  7.0  7.5   Total Bilirubin 0.3 - 1.2 mg/dL 0.5  0.3  0.5   Alkaline Phos 38 - 126 U/L 58  67  64   AST 15 - 41 U/L '19  13  16   '$ ALT 0 - 44 U/L 33  20  16       RADIOGRAPHIC STUDIES: I have personally reviewed the radiological images as listed and agreed with the findings in the report. No results found.    No orders of the defined types were placed in this encounter.  All questions were answered. The patient knows to call the clinic with any problems, questions or concerns. No barriers to learning was detected. The total time spent in the appointment was 30 minutes.     Truitt Merle, MD 03/24/2022   I, Wilburn Mylar, am acting as scribe for Truitt Merle, MD.   I have reviewed the above documentation for accuracy and completeness, and I agree with the above.

## 2022-03-25 ENCOUNTER — Ambulatory Visit
Admission: RE | Admit: 2022-03-25 | Discharge: 2022-03-25 | Disposition: A | Discharge status: Home / Self Care | Payer: No Typology Code available for payment source | Source: Ambulatory Visit | Attending: Radiation Oncology | Admitting: Radiation Oncology

## 2022-03-25 ENCOUNTER — Other Ambulatory Visit (HOSPITAL_COMMUNITY): Payer: Self-pay

## 2022-03-25 ENCOUNTER — Inpatient Hospital Stay: Payer: No Typology Code available for payment source

## 2022-03-25 ENCOUNTER — Other Ambulatory Visit: Payer: Self-pay

## 2022-03-25 ENCOUNTER — Encounter: Payer: Self-pay | Admitting: Hematology

## 2022-03-25 DIAGNOSIS — E860 Dehydration: Secondary | ICD-10-CM

## 2022-03-25 DIAGNOSIS — D500 Iron deficiency anemia secondary to blood loss (chronic): Secondary | ICD-10-CM

## 2022-03-25 DIAGNOSIS — R112 Nausea with vomiting, unspecified: Secondary | ICD-10-CM

## 2022-03-25 DIAGNOSIS — Z452 Encounter for adjustment and management of vascular access device: Secondary | ICD-10-CM

## 2022-03-25 LAB — RAD ONC ARIA SESSION SUMMARY
Course Elapsed Days: 16
Plan Fractions Treated to Date: 11
Plan Prescribed Dose Per Fraction: 1.8 Gy
Plan Total Fractions Prescribed: 28
Plan Total Prescribed Dose: 50.4 Gy
Reference Point Dosage Given to Date: 23.4 Gy
Reference Point Session Dosage Given: 1.8 Gy
Session Number: 13

## 2022-03-25 MED ORDER — HEPARIN SOD (PORK) LOCK FLUSH 100 UNIT/ML IV SOLN
250.0000 [IU] | Freq: Once | INTRAVENOUS | Status: AC
  Administered 2022-03-25: 250 [IU]

## 2022-03-25 MED ORDER — SODIUM CHLORIDE 0.9% FLUSH
10.0000 mL | Freq: Once | INTRAVENOUS | Status: AC
  Administered 2022-03-25: 10 mL

## 2022-03-26 ENCOUNTER — Encounter: Payer: No Typology Code available for payment source | Admitting: Physical Therapy

## 2022-03-26 ENCOUNTER — Ambulatory Visit
Admission: RE | Admit: 2022-03-26 | Discharge: 2022-03-26 | Disposition: A | Discharge status: Home / Self Care | Payer: No Typology Code available for payment source | Source: Ambulatory Visit | Attending: Radiation Oncology | Admitting: Radiation Oncology

## 2022-03-26 ENCOUNTER — Other Ambulatory Visit: Payer: Self-pay

## 2022-03-26 ENCOUNTER — Encounter: Payer: Self-pay | Admitting: Physical Therapy

## 2022-03-26 ENCOUNTER — Inpatient Hospital Stay: Payer: No Typology Code available for payment source

## 2022-03-26 DIAGNOSIS — R112 Nausea with vomiting, unspecified: Secondary | ICD-10-CM

## 2022-03-26 DIAGNOSIS — E860 Dehydration: Secondary | ICD-10-CM

## 2022-03-26 DIAGNOSIS — R102 Pelvic and perineal pain: Secondary | ICD-10-CM

## 2022-03-26 DIAGNOSIS — M25651 Stiffness of right hip, not elsewhere classified: Secondary | ICD-10-CM

## 2022-03-26 DIAGNOSIS — Z452 Encounter for adjustment and management of vascular access device: Secondary | ICD-10-CM

## 2022-03-26 DIAGNOSIS — M6281 Muscle weakness (generalized): Secondary | ICD-10-CM

## 2022-03-26 DIAGNOSIS — M25652 Stiffness of left hip, not elsewhere classified: Secondary | ICD-10-CM

## 2022-03-26 DIAGNOSIS — D500 Iron deficiency anemia secondary to blood loss (chronic): Secondary | ICD-10-CM

## 2022-03-26 LAB — RAD ONC ARIA SESSION SUMMARY
Course Elapsed Days: 17
Plan Fractions Treated to Date: 12
Plan Prescribed Dose Per Fraction: 1.8 Gy
Plan Total Fractions Prescribed: 28
Plan Total Prescribed Dose: 50.4 Gy
Reference Point Dosage Given to Date: 25.2 Gy
Reference Point Session Dosage Given: 1.8 Gy
Session Number: 14

## 2022-03-26 MED ORDER — HEPARIN SOD (PORK) LOCK FLUSH 100 UNIT/ML IV SOLN
250.0000 [IU] | Freq: Once | INTRAVENOUS | Status: AC
  Administered 2022-03-26: 250 [IU]

## 2022-03-26 MED ORDER — SODIUM CHLORIDE 0.9% FLUSH
10.0000 mL | Freq: Once | INTRAVENOUS | Status: AC
  Administered 2022-03-26: 10 mL

## 2022-03-26 NOTE — Therapy (Signed)
OUTPATIENT PHYSICAL THERAPY TREATMENT NOTE   Patient Name: Tammy Garrison MRN: [id number] DOB:Jul 29, 1991, 31 y.o., female Today's Date: 03/26/2022  PCP: None REFERRING PROVIDER: Hayden Pedro, PA-C  END OF SESSION:   PT End of Session - 03/26/22 1403     Visit Number 2    Date for PT Re-Evaluation 07/17/22    Authorization Type self pay    PT Start Time 1400    PT Stop Time 1445    PT Time Calculation (min) 45 min    Activity Tolerance Patient tolerated treatment well;No increased pain    Behavior During Therapy WFL for tasks assessed/performed             Past Medical History:  Diagnosis Date   Bipolar 1 disorder ([place])    ETOH abuse    H/O self-harm    H/O suicide attempt    x 5 - last 05/2016 - overdose Ibuprofen   Marijuana abuse    Rheumatoid arthritis ([place])    Past Surgical History:  Procedure Laterality Date   HEMORRHOID SURGERY N/A 02/08/2022   Procedure: EXTERNAL AND INTERNAL HEMORRHOIDECTOMY;  Surgeon: Georganna Skeans, MD;  Location: [facility];  Service: General;  Laterality: N/A;   Patient Active Problem List   Diagnosis Date Noted   PICC (peripherally inserted central catheter) in place 03/14/2022   Dehydration 03/13/2022   Nausea with vomiting 03/13/2022   Anal cancer ([place]) 02/23/2022   Bipolar 1 disorder ([place]) 02/23/2022   RA (rheumatoid arthritis) ([place]) 02/07/2022   Syncope 02/06/2022   ABLA (acute blood loss anemia) 02/06/2022   BRBPR (bright red blood per rectum) 02/06/2022   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   Rationale for Evaluation and Treatment Rehabilitation   ONSET DATE: 02/08/2022   SUBJECTIVE:                                                                                                                                                                                             SUBJECTIVE STATEMENT: I am doing the radiation. My skin is peeling now.     PAIN:  Are you having pain? Yes NPRS scale: 7/10 Pain location: Anterior, rectum   Pain type: burning and cramping Pain description: intermittent    Aggravating factors: sudden onset, difficulty with sitting Relieving factors: shower and lay on fan.    PRECAUTIONS: Other: anal cancer   WEIGHT BEARING RESTRICTIONS No   FALLS:  Has patient fallen  in last 6 months? No   LIVING ENVIRONMENT: Lives with: lives alone   OCCUPATION: none   PLOF: Independent   PATIENT GOALS understand how to manage the pain, understand the changes the pelvis goes through and what to do   PERTINENT HISTORY:  Rheumatoid arthritis ([place]), anal cancer   BOWEL MOVEMENT Pain with bowel movement: Yes at level 7/10 Type of bowel movement:Type (Bristol Stool Scale) type 4 , Frequency every other day, and Strain Yes Fully empty rectum: No Leakage: yes, started 3 days ago 03/26/2022 Pads: No Fiber supplement: Yes: miralax 2x/day   URINATION Pain with urination: No Fully empty bladder: Yes:   Stream: Strong Urgency: Yes: not able to hold the urine Frequency: 1.5 to 2 hours Leakage: Urge to void, Walking to the bathroom, Coughing, Sneezing, and Bending forward Pads: No   INTERCOURSE Pain with intercourse: Initial Penetration and During Penetration Ability to have vaginal penetration:  Yes: clitorus is sensitive Climax: no Marinoff Scale: 1/3         OBJECTIVE:    DIAGNOSTIC FINDINGS:  none   PATIENT SURVEYS:  PFIQ-7 129; UIQ-7 43; CRAIQ-7 43; POPIQ-7 43   COGNITION:            Overall cognitive status: Within functional limits for tasks assessed                          SENSATION:            Light touch: Appears intact            Proprioception: Appears intact                  POSTURE: No Significant postural limitations               PELVIC ALIGNMENT:    LUMBARAROM/PROM   A/PROM A/PROM  eval  Flexion Decreased by 25%  Extension full  Right lateral flexion full  Left lateral flexion full  Right rotation full  Left rotation full   (Blank rows = not tested)   LOWER EXTREMITY ROM:   Passive ROM Right eval Left eval  Hip flexion 120 110  Hip extension 10 10  Hip abduction 20 25  Hip internal rotation 10 10  Hip external rotation 40 50   (Blank rows = not tested)   LOWER EXTREMITY MMT:   MMT Right eval Left eval  Hip flexion 4/5 4/5  Hip extension 4/5 4/5  Hip abduction 4/5 4/5  Hip internal rotation 4/5 4/5  Hip external rotation 4/5 4/5     PALPATION:   General                   External Perineal Exam tenderness located on the labia majora, labia minora, clitorus, and vulva                             Internal Pelvic Floor tenderness located in the introitus, only placed index finger in 1/4 inch to assess the strength   Patient confirms identification and approves PT to assess internal pelvic floor and treatment Yes   PELVIC MMT:   MMT eval  Vaginal 2/5 and circular but no lift holding for 5 sec  Internal Anal Sphincter    External Anal Sphincter    Puborectalis    Diastasis Recti    (Blank rows = not tested)  TONE: Increased due to pain   PROLAPSE: None noted   TODAY'S TREATMENT  03/26/2022 Manual: Soft tissue mobilization:circular massage to the abdomen to assist with peristalic motion of the intestines then educated patient on how to perform at home. Lifting the tissue off the suprapubic bone to release around the bladder and educated patient on how to perform at home.  Myofascial release:fascial release in the lower abdomen to reduce the restrictions and firmness around the bladder.  Exercises: Stretches/mobility:single knee to chest  bil. 15 sec    Supine figure 4 holding for 30 sec bil.     Supine hamstring stretch hold 30 sec     Supine trunk rotation bil. Hold 30 sec    Supine  butterfly stretch hold 30 sec    Supine hip flexor stretch hold 30 sec bil.  Therapeutic activities: Functional strengthening activities:sit to stand with trunk leaning forward    Reviewed toileting technique and contract 5 times after bowel movement Self care Educated patient to not drink 2 hours prior to bed time and elevate her legs 30 minutes prior to bed time to reduce the number of night time voids.     EVAL Date: 03/17/2022 HEP established-see below      PATIENT EDUCATION:  03/26/2022 Education details: Access Code: [email or web address]; toileting technique; abdominal massage Person educated: Patient Education method: Explanation, Demonstration, Tactile cues, Verbal cues, and Handouts Education comprehension: verbalized understanding, returned demonstration, verbal cues required, tactile cues required, and needs further education     HOME EXERCISE PROGRAM: 03/26/2022  Access Code: [email or web address] URL: [email or web address] Date: 03/26/2022 Prepared by: Earlie Counts  Exercises - Seated Pelvic Floor Contraction  - 3 x daily - 7 x weekly - 1 sets - 5 reps - 5 sec hold - Hooklying Single Knee to Chest Stretch  - 2 x daily - 7 x weekly - 1 sets - 2 reps - 1 sec hold - Supine Figure 4 Piriformis Stretch  - 2 x daily - 7 x weekly - 1 sets - 2 reps - 30 sec hold - Supine Hamstring Stretch  - 2 x daily - 7 x weekly - 1 sets - 2 reps - 30 sec hold - Supine Lower Trunk Rotation  - 2 x daily - 7 x weekly - 1 sets - 2 reps - 30 sec hold - Supine Butterfly Groin Stretch  - 2 x daily - 7 x weekly - 1 sets - 2 reps - 30 sec hold - Modified Thomas Stretch  - 2 x daily - 7 x weekly - 1 sets - 2 reps - 30 sec hold ASSESSMENT:   CLINICAL IMPRESSION: Patient is a 31 y.o. female who was seen today for physical therapy  treatment for anal cancer. Patient had pain decreased from 7/10-4/10. She had reduction of tension in the lower abdomen. She has learned how to get up from a chair to put less pressure  on her abdomen. She has learned hip stretches. Patient is waking up every hour at night so therapist instructed her to not drink 2 hours prior to bed time and elevate her legs. . Patient will benefit from skilled therapy to improve mobility, strength and how to manage the changes from radiation.      OBJECTIVE IMPAIRMENTS decreased activity tolerance, decreased coordination, decreased endurance, decreased ROM, decreased strength, increased fascial restrictions, and pain.    ACTIVITY LIMITATIONS sitting, standing, squatting, transfers, bed mobility, continence, toileting, and locomotion level   PARTICIPATION LIMITATIONS: cleaning, laundry, interpersonal relationship, and community  activity   PERSONAL FACTORS Fitness, Past/current experiences, Time since onset of injury/illness/exacerbation, and 3+ comorbidities:    Rheumatoid arthritis ([place]), anal cancer with present chemotherapy and radiation treatment are also affecting patient's functional outcome.    REHAB POTENTIAL: Excellent   CLINICAL DECISION MAKING: Evolving/moderate complexity   EVALUATION COMPLEXITY: Moderate     GOALS: Goals reviewed with patient? Yes   SHORT TERM GOALS: Target date: 04/14/2022   Patient understands hip stretching program to reduce the tightness that happens from radiation.  Baseline: not educated yet Goal status: Met 03/26/2022   2.  Patient understands correct toileting technique to reduce pain with bowel movements.  Baseline: not educated yet Goal status: Met 03/26/2022   3.  Patient understands how to perform diaphragmatic breathing to elongate the pelvic floor for a bowel movement.  Baseline: not educated yet Goal status: INITIAL   4.  Patient understands the importance of a walking program to manage endurance and balance.  Baseline: not educated yet Goal status: INITIAL   5.  Patient was educated on rectal health.  Baseline: not educated yet Goal status: INITIAL     LONG TERM GOALS: Target  date: 07/17/2022    Patient independent with advanced HEP for core and hip strength.  Baseline: not educated yet Goal status: INITIAL   2.  Pelvic floor strength >/= 3/5 to reduce her urinary leakage >/= 80%.  Baseline: strength is 2/5 and leaks with urge to void, walking to the bathroom, coughing, sneezing, and bending forward. Goal status: INITIAL   3.  Patient reports her pain with bowel movements decreased >/= 2/10 due to healing of the tissue and correct relaxation of the pelvic floor.  Baseline: pain level 6/10 Goal status: INITIAL   4.  Patient understands how to use the vaginal dilators to keep the opening of the introitus after radiation.  Baseline: not educated yet.  Goal status: INITIAL       PLAN: PT FREQUENCY: 1x/week   PT DURATION: other: for 4 months up to 12 visitis.    PLANNED INTERVENTIONS: Therapeutic exercises, Therapeutic activity, Neuromuscular re-education, Balance training, Patient/Family education, Self Care, Biofeedback, and Manual therapy   PLAN FOR NEXT SESSION: go over hip stretches, diaphragmatic breahting, skin care, walking program, balance  Earlie Counts, PT 03/26/22 2:57 PM

## 2022-03-27 ENCOUNTER — Other Ambulatory Visit: Payer: Self-pay

## 2022-03-27 ENCOUNTER — Inpatient Hospital Stay: Payer: No Typology Code available for payment source

## 2022-03-27 ENCOUNTER — Ambulatory Visit
Admission: RE | Admit: 2022-03-27 | Discharge: 2022-03-27 | Disposition: A | Discharge status: Home / Self Care | Payer: Self-pay | Source: Ambulatory Visit | Attending: Radiation Oncology | Admitting: Radiation Oncology

## 2022-03-27 DIAGNOSIS — D500 Iron deficiency anemia secondary to blood loss (chronic): Secondary | ICD-10-CM

## 2022-03-27 DIAGNOSIS — R112 Nausea with vomiting, unspecified: Secondary | ICD-10-CM

## 2022-03-27 DIAGNOSIS — E860 Dehydration: Secondary | ICD-10-CM

## 2022-03-27 DIAGNOSIS — Z452 Encounter for adjustment and management of vascular access device: Secondary | ICD-10-CM

## 2022-03-27 LAB — RAD ONC ARIA SESSION SUMMARY
Course Elapsed Days: 18
Plan Fractions Treated to Date: 13
Plan Prescribed Dose Per Fraction: 1.8 Gy
Plan Total Fractions Prescribed: 28
Plan Total Prescribed Dose: 50.4 Gy
Reference Point Dosage Given to Date: 27 Gy
Reference Point Session Dosage Given: 1.8 Gy
Session Number: 15

## 2022-03-27 MED ORDER — SODIUM CHLORIDE 0.9% FLUSH
10.0000 mL | Freq: Once | INTRAVENOUS | Status: AC
  Administered 2022-03-27: 10 mL

## 2022-03-27 MED ORDER — HEPARIN SOD (PORK) LOCK FLUSH 100 UNIT/ML IV SOLN
250.0000 [IU] | Freq: Once | INTRAVENOUS | Status: AC
  Administered 2022-03-27: 250 [IU]

## 2022-03-30 ENCOUNTER — Ambulatory Visit
Admission: RE | Admit: 2022-03-30 | Discharge: 2022-03-30 | Disposition: A | Discharge status: Home / Self Care | Payer: Self-pay | Source: Ambulatory Visit | Attending: Radiation Oncology | Admitting: Radiation Oncology

## 2022-03-30 ENCOUNTER — Other Ambulatory Visit: Payer: Self-pay

## 2022-03-30 ENCOUNTER — Inpatient Hospital Stay (HOSPITAL_BASED_OUTPATIENT_CLINIC_OR_DEPARTMENT_OTHER): Payer: Self-pay | Admitting: Hematology

## 2022-03-30 ENCOUNTER — Inpatient Hospital Stay: Payer: No Typology Code available for payment source

## 2022-03-30 ENCOUNTER — Other Ambulatory Visit: Payer: No Typology Code available for payment source

## 2022-03-30 ENCOUNTER — Encounter: Payer: Self-pay | Admitting: Hematology

## 2022-03-30 VITALS — BP 131/90 | HR 112 | Temp 98.4°F | Resp 19 | Ht 72.0 in | Wt 275.2 lb

## 2022-03-30 DIAGNOSIS — C210 Malignant neoplasm of anus, unspecified: Secondary | ICD-10-CM

## 2022-03-30 DIAGNOSIS — R112 Nausea with vomiting, unspecified: Secondary | ICD-10-CM

## 2022-03-30 DIAGNOSIS — Z452 Encounter for adjustment and management of vascular access device: Secondary | ICD-10-CM

## 2022-03-30 DIAGNOSIS — D500 Iron deficiency anemia secondary to blood loss (chronic): Secondary | ICD-10-CM

## 2022-03-30 DIAGNOSIS — E860 Dehydration: Secondary | ICD-10-CM

## 2022-03-30 LAB — RAD ONC ARIA SESSION SUMMARY
Course Elapsed Days: 21
Plan Fractions Treated to Date: 14
Plan Prescribed Dose Per Fraction: 1.8 Gy
Plan Total Fractions Prescribed: 28
Plan Total Prescribed Dose: 50.4 Gy
Reference Point Dosage Given to Date: 28.8 Gy
Reference Point Session Dosage Given: 1.8 Gy
Session Number: 16

## 2022-03-30 LAB — CBC WITH DIFFERENTIAL (CANCER CENTER ONLY)
Abs Immature Granulocytes: 0.09 10*3/uL — ABNORMAL HIGH (ref 0.00–0.07)
Basophils Absolute: 0 10*3/uL (ref 0.0–0.1)
Basophils Relative: 0 %
Eosinophils Absolute: 0 10*3/uL (ref 0.0–0.5)
Eosinophils Relative: 0 %
HCT: 34.9 % — ABNORMAL LOW (ref 36.0–46.0)
Hemoglobin: 10.8 g/dL — ABNORMAL LOW (ref 12.0–15.0)
Immature Granulocytes: 1 %
Lymphocytes Relative: 2 %
Lymphs Abs: 0.3 10*3/uL — ABNORMAL LOW (ref 0.7–4.0)
MCH: 23.9 pg — ABNORMAL LOW (ref 26.0–34.0)
MCHC: 30.9 g/dL (ref 30.0–36.0)
MCV: 77.2 fL — ABNORMAL LOW (ref 80.0–100.0)
Monocytes Absolute: 2.1 10*3/uL — ABNORMAL HIGH (ref 0.1–1.0)
Monocytes Relative: 16 %
Neutro Abs: 10.2 10*3/uL — ABNORMAL HIGH (ref 1.7–7.7)
Neutrophils Relative %: 81 %
Platelet Count: 155 10*3/uL (ref 150–400)
RBC: 4.52 MIL/uL (ref 3.87–5.11)
WBC Count: 12.7 10*3/uL — ABNORMAL HIGH (ref 4.0–10.5)
nRBC: 0 % (ref 0.0–0.2)

## 2022-03-30 LAB — CMP (CANCER CENTER ONLY)
ALT: 42 U/L (ref 0–44)
AST: 26 U/L (ref 15–41)
Albumin: 4 g/dL (ref 3.5–5.0)
Alkaline Phosphatase: 54 U/L (ref 38–126)
Anion gap: 4 — ABNORMAL LOW (ref 5–15)
BUN: 11 mg/dL (ref 6–20)
CO2: 31 mmol/L (ref 22–32)
Calcium: 9.8 mg/dL (ref 8.9–10.3)
Chloride: 99 mmol/L (ref 98–111)
Creatinine: 0.52 mg/dL (ref 0.44–1.00)
GFR, Estimated: >60 mL/min (ref >60)
Glucose, Bld: 86 mg/dL (ref 70–99)
Potassium: 4.3 mmol/L (ref 3.5–5.1)
Sodium: 134 mmol/L — ABNORMAL LOW (ref 135–145)
Total Bilirubin: 0.6 mg/dL (ref 0.3–1.2)
Total Protein: 7.4 g/dL (ref 6.5–8.1)

## 2022-03-30 MED ORDER — SODIUM CHLORIDE 0.9% FLUSH
10.0000 mL | Freq: Once | INTRAVENOUS | Status: AC
  Administered 2022-03-30: 10 mL

## 2022-03-30 MED ORDER — HEPARIN SOD (PORK) LOCK FLUSH 100 UNIT/ML IV SOLN
500.0000 [IU] | Freq: Once | INTRAVENOUS | Status: AC
  Administered 2022-03-30: 500 [IU]

## 2022-03-30 NOTE — Progress Notes (Signed)
[facility]   [phone] [phone]   Clinic Follow up Note   Patient Care Team: Pcp, No as PCP - General [name], MD (Rheumatology) Royston Sinner Colin Benton, MD as Consulting Physician (Obstetrics and Gynecology) Truitt Merle, MD as Consulting Physician (Hematology and Oncology)  Date of Service:  03/30/2022  CHIEF COMPLAINT: f/u of anal cancer  CURRENT THERAPY:  Neoadjuvant concurrent chemoRT, starting 03/09/22             -mitomycin on day 1, 28             -5FU on days 1-5, 28-31  ASSESSMENT & PLAN:  Tammy Garrison is a 31 y.o. female with   1. Anal Cancer, cT2N0Mx, with hypermetabolic left axillary nodes, HPV(+) -presented with worsening rectal bleeding, began passing clots and was admitted 02/06/22 for anemia from blood loss. S/p hemorrhoidectomy on 02/08/22 by Dr. 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 -PET scan 02/26/22 showed: intense uptake to anus; no signs of solid organ or FDG-avid nodal metastasis within abdomen or pelvis, hypermetabolic left axillary lymph nodes.  Sequently left axillary lymph node biopsy was benign. -she established care with Dr. Dema Severin on 03/03/22. -she began concurrent chemoRT with mitomycin/5FU on 03/09/22. She developed severe nausea with vomiting, as well as headaches. She was given IVF on three days last week and recovered well. -she is keeping her PICC line in place and has it flushed daily when she comes in for radiation. -labs reviewed, hgb up to 10.8 today, she is responding well to IV Feraheme.    2. Chemo Toxicities: Rectal irritation, Night sweats -she has developed irritation from radiation. She did not tolerate Norco with nausea and vomiting. She is now on tylenol #3. -she reports she has developed drenching night sweats and hot flashes. I previously discussed she could be going through menopause  because of chemotherapy. She is currently on her period this week.   3. Rectal Bleeding, Pain, Constipation -she began passing blood clots in early 01/2022. -s/p hemorrhoidectomy -she is now on a daily miralax regimen and has colace.    4. Anemia, from bleeding and iron deficiency -low hgb dating back to 04/2018, prior to that in 2017 was WNL. She has heavy periods, requiring pad/tampon change every 30 minutes. -she has required IV Feraheme and pRBC. -hgb 10.8 today (03/30/22), she is responding well.   5. Rheumatoid Arthritis -previously on methotrexate, has been off for at least 2 years due to lack of insurance coverage.   6. Social Support -currently unemployed, secondary to absence from her rectal symptoms. -she lives alone with two cats, no children. -previously referred for transportation services.     PLAN:  -continue daily radiation and PICC flush -lab, f/u, and 5FU/mitomycin on 9/5 -continue skin care and tylenol #3 as needed for pain   No problem-specific Assessment & Plan notes found for this encounter.   SUMMARY OF ONCOLOGIC HISTORY: Oncology History  Anal cancer ([place])  02/08/2022 Pathology Results   FINAL MICROSCOPIC DIAGNOSIS:   A. SOFT TISSUE, ANTERIOR MASS VS. HEMORRHOID, EXCISION:  Invasive squamous cell carcinoma, focally keratinizing and  well-differentiated.  Carcinoma is present at the margins of excision.   B. HEMORRHOID, INTERNAL, EXCISION:  Hemorrhoid without the overlying epithelium.  Separate fragment of benign squamous epithelium.  Negative for neoplasm.   ADDENDUM:  P16 immunostain is strongly positive suggestive of this lesion being  the HPV related.    02/08/2022 Cancer Staging   Staging form: Anus, AJCC V9 - Clinical stage from 02/08/2022: Stage IIA (cT2, cN0, cM0) - Signed by Truitt Merle, MD on 02/23/2022 Stage prefix: Initial diagnosis   02/23/2022 Initial Diagnosis   Anal cancer ([place])   03/09/2022 - 03/13/2022 Chemotherapy   Patient is on  Treatment Plan : ANUS Mitomycin D1,28 / 5FU D1-4, 28-31 q32d     03/09/2022 -  Chemotherapy   Patient is on Treatment Plan : ANUS Mitomycin D1,28 + 5FU D1-4, 28-31 q32d        INTERVAL HISTORY:  Tammy Garrison is here for a follow up of anal cancer. She was last seen by me on 03/24/22. She presents to the clinic alone. She reports her anal irritation is getting worse, and she is very fatigued. She reports her BM are okay, and the pain is worse with wiping. She reports she still has hematochezia but that it's less. She also notes continued night sweats.  She adds she is currently on her period and was told she cannot use tampons.   All other systems were reviewed with the patient and are negative.  MEDICAL HISTORY:  Past Medical History:  Diagnosis Date   Bipolar 1 disorder ([place])    ETOH abuse    H/O self-harm    H/O suicide attempt    x 5 - last 05/2016 - overdose Ibuprofen   Marijuana abuse    Rheumatoid arthritis ([place])     SURGICAL HISTORY: Past Surgical History:  Procedure Laterality Date   HEMORRHOID SURGERY N/A 02/08/2022   Procedure: EXTERNAL AND INTERNAL HEMORRHOIDECTOMY;  Surgeon: Georganna Skeans, MD;  Location: [facility];  Service: General;  Laterality: N/A;    I have reviewed the social history and family history with the patient and they are unchanged from previous note.  ALLERGIES:  is allergic to coconut flavor.  MEDICATIONS:  Current Outpatient Medications  Medication Sig Dispense Refill   acetaminophen (TYLENOL) 500 MG tablet Take 2 tablets (1,000 mg total) by mouth every 6 (six) hours as needed. (Patient not taking: Reported on 03/05/2022) 100 tablet 2   acetaminophen-codeine (TYLENOL #3) 300-30 MG tablet Take 1 tablet by mouth every 6 hours as needed for moderate pain. 30 tablet 0   Ascorbic Acid (VITAMIN C PO) Take 1 tablet by mouth daily. (Patient not taking: Reported on 03/05/2022)     dexamethasone (DECADRON) 4 MG tablet Take 1 tablet (4 mg total) by  mouth daily. Take daily for up to 5 days after chemo. 30 tablet 0   docusate sodium (COLACE) 100 MG capsule Take 1 capsule (100 mg total) by mouth 2 (two) times daily. (Patient not taking: Reported on 03/05/2022) 10 capsule 0   HYDROcodone-acetaminophen (NORCO/VICODIN) 5-325 MG tablet Take 1 tablet by mouth every 6 (six) hours as needed for moderate pain. (Patient not taking: Reported on 03/05/2022) 30 tablet 0   Multiple Vitamins-Minerals (HAIR/SKIN/NAILS) CAPS Take 1 capsule by mouth daily. (Patient not taking: Reported on 03/05/2022)     ondansetron (ZOFRAN-ODT) 8 MG disintegrating tablet Dissolve 1 tablet (8 mg total) by mouth every 8 (eight) hours as needed for nausea or vomiting. 20 tablet 1   polyethylene glycol (MIRALAX / GLYCOLAX) 17 g packet Take 17 g by mouth 2 (two) times daily. (Patient not taking: Reported on 03/05/2022) 14 each 0   promethazine (PHENERGAN) 25 MG tablet Take 1 tablet (25 mg total) by mouth every 6 (six) hours as needed for nausea  or vomiting. 30 tablet 1   trolamine salicylate (ASPERCREME) 10 % cream Apply 1 application topically as needed for muscle pain. (Patient not taking: Reported on 03/05/2022)     VITAMIN D PO Take 1 capsule by mouth daily. (Patient not taking: Reported on 03/05/2022)     No current facility-administered medications for this visit.   Facility-Administered Medications Ordered in Other Visits  Medication Dose Route Frequency Provider Last Rate Last Admin   0.9 %  sodium chloride infusion (Manually program via Guardrails IV Fluids)  250 mL Intravenous Once Truitt Merle, MD        PHYSICAL EXAMINATION: ECOG PERFORMANCE STATUS: 2 - Symptomatic, <50% confined to bed  Vitals:   03/30/22 0953  BP: (!) 131/90  Pulse: (!) 112  Resp: 19  Temp: 98.4 F (36.9 C)  SpO2: 98%   Wt Readings from Last 3 Encounters:  03/30/22 275 lb 3.2 oz (124.8 kg)  03/24/22 272 lb 6.4 oz (123.6 kg)  03/16/22 261 lb 6.4 oz (118.6 kg)     GENERAL:alert, no distress and  comfortable SKIN: skin color normal, no rashes or significant lesions EYES: normal, Conjunctiva are pink and non-injected, sclera clear  NEURO: alert & oriented x 3 with fluent speech  LABORATORY DATA:  I have reviewed the data as listed    Latest Ref Rng & Units 03/30/2022    9:43 AM 03/24/2022   10:13 AM 03/20/2022    9:22 AM  CBC  WBC 4.0 - 10.5 K/uL 12.7  8.9  7.1   Hemoglobin 12.0 - 15.0 g/dL 10.8  9.6  8.8   Hematocrit 36.0 - 46.0 % 34.9  32.1  28.8   Platelets 150 - 400 K/uL 155  76  114         Latest Ref Rng & Units 03/24/2022   10:13 AM 03/20/2022    9:22 AM 03/16/2022   11:53 AM  CMP  Glucose 70 - 99 mg/dL 165  190  194   BUN 6 - 20 mg/dL '15  10  11   '$ Creatinine 0.44 - 1.00 mg/dL 0.77  0.67  0.64   Sodium 135 - 145 mmol/L 137  137  135   Potassium 3.5 - 5.1 mmol/L 3.9  3.7  3.4   Chloride 98 - 111 mmol/L 104  105  105   CO2 22 - 32 mmol/L '28  27  27   '$ Calcium 8.9 - 10.3 mg/dL 9.3  9.3  9.0   Total Protein 6.5 - 8.1 g/dL 7.1  7.0  7.5   Total Bilirubin 0.3 - 1.2 mg/dL 0.5  0.3  0.5   Alkaline Phos 38 - 126 U/L 58  67  64   AST 15 - 41 U/L '19  13  16   '$ ALT 0 - 44 U/L 33  20  16       RADIOGRAPHIC STUDIES: I have personally reviewed the radiological images as listed and agreed with the findings in the report. No results found.    No orders of the defined types were placed in this encounter.  All questions were answered. The patient knows to call the clinic with any problems, questions or concerns. No barriers to learning was detected. The total time spent in the appointment was 25 minutes.     Truitt Merle, MD 03/30/2022   I, Wilburn Mylar, am acting as scribe for Truitt Merle, MD.   I have reviewed the above documentation for accuracy and completeness, and I agree with  the above.

## 2022-03-31 ENCOUNTER — Emergency Department (HOSPITAL_COMMUNITY): Payer: No Typology Code available for payment source

## 2022-03-31 ENCOUNTER — Other Ambulatory Visit: Payer: Self-pay

## 2022-03-31 ENCOUNTER — Emergency Department (HOSPITAL_COMMUNITY)
Admission: EM | Admit: 2022-03-31 | Discharge: 2022-04-01 | Disposition: A | Discharge status: Home / Self Care | Payer: No Typology Code available for payment source | Attending: Emergency Medicine | Admitting: Emergency Medicine

## 2022-03-31 ENCOUNTER — Ambulatory Visit
Admission: RE | Admit: 2022-03-31 | Discharge: 2022-03-31 | Disposition: A | Discharge status: Home / Self Care | Payer: Self-pay | Source: Ambulatory Visit | Attending: Radiation Oncology | Admitting: Radiation Oncology

## 2022-03-31 ENCOUNTER — Inpatient Hospital Stay: Payer: No Typology Code available for payment source

## 2022-03-31 ENCOUNTER — Encounter (HOSPITAL_COMMUNITY): Payer: Self-pay

## 2022-03-31 DIAGNOSIS — N939 Abnormal uterine and vaginal bleeding, unspecified: Secondary | ICD-10-CM

## 2022-03-31 DIAGNOSIS — R42 Dizziness and giddiness: Secondary | ICD-10-CM | POA: Insufficient documentation

## 2022-03-31 DIAGNOSIS — Z85048 Personal history of other malignant neoplasm of rectum, rectosigmoid junction, and anus: Secondary | ICD-10-CM | POA: Insufficient documentation

## 2022-03-31 LAB — RAD ONC ARIA SESSION SUMMARY
Course Elapsed Days: 22
Plan Fractions Treated to Date: 15
Plan Prescribed Dose Per Fraction: 1.8 Gy
Plan Total Fractions Prescribed: 28
Plan Total Prescribed Dose: 50.4 Gy
Reference Point Dosage Given to Date: 30.6 Gy
Reference Point Session Dosage Given: 1.8 Gy
Session Number: 17

## 2022-03-31 LAB — I-STAT CHEM 8, ED
BUN: 11 mg/dL (ref 6–20)
Calcium, Ion: 1.15 mmol/L (ref 1.15–1.40)
Chloride: 99 mmol/L (ref 98–111)
Creatinine, Ser: 0.6 mg/dL (ref 0.44–1.00)
Glucose, Bld: 101 mg/dL — ABNORMAL HIGH (ref 70–99)
HCT: 34 % — ABNORMAL LOW (ref 36.0–46.0)
Hemoglobin: 11.6 g/dL — ABNORMAL LOW (ref 12.0–15.0)
Potassium: 3.9 mmol/L (ref 3.5–5.1)
Sodium: 133 mmol/L — ABNORMAL LOW (ref 135–145)
TCO2: 26 mmol/L (ref 22–32)

## 2022-03-31 LAB — LIPASE, BLOOD: Lipase: 27 U/L (ref 11–51)

## 2022-03-31 LAB — WET PREP, GENITAL
Clue Cells Wet Prep HPF POC: NONE SEEN
Sperm: NONE SEEN
Trich, Wet Prep: NONE SEEN
WBC, Wet Prep HPF POC: <10 (ref <10)
Yeast Wet Prep HPF POC: NONE SEEN

## 2022-03-31 LAB — CBC WITH DIFFERENTIAL/PLATELET
Abs Immature Granulocytes: 0.15 10*3/uL — ABNORMAL HIGH (ref 0.00–0.07)
Basophils Absolute: 0 10*3/uL (ref 0.0–0.1)
Basophils Relative: 0 %
Eosinophils Absolute: 0 10*3/uL (ref 0.0–0.5)
Eosinophils Relative: 0 %
HCT: 34.3 % — ABNORMAL LOW (ref 36.0–46.0)
Hemoglobin: 10.3 g/dL — ABNORMAL LOW (ref 12.0–15.0)
Immature Granulocytes: 1 %
Lymphocytes Relative: 3 %
Lymphs Abs: 0.5 10*3/uL — ABNORMAL LOW (ref 0.7–4.0)
MCH: 24.3 pg — ABNORMAL LOW (ref 26.0–34.0)
MCHC: 30 g/dL (ref 30.0–36.0)
MCV: 81.1 fL (ref 80.0–100.0)
Monocytes Absolute: 2 10*3/uL — ABNORMAL HIGH (ref 0.1–1.0)
Monocytes Relative: 14 %
Neutro Abs: 11.3 10*3/uL — ABNORMAL HIGH (ref 1.7–7.7)
Neutrophils Relative %: 82 %
Platelets: 154 10*3/uL (ref 150–400)
RBC: 4.23 MIL/uL (ref 3.87–5.11)
RDW: 36.5 % — ABNORMAL HIGH (ref 11.5–15.5)
WBC: 13.9 10*3/uL — ABNORMAL HIGH (ref 4.0–10.5)
nRBC: 0 % (ref 0.0–0.2)

## 2022-03-31 LAB — CBC
HCT: 32.6 % — ABNORMAL LOW (ref 36.0–46.0)
Hemoglobin: 9.8 g/dL — ABNORMAL LOW (ref 12.0–15.0)
MCH: 24.4 pg — ABNORMAL LOW (ref 26.0–34.0)
MCHC: 30.1 g/dL (ref 30.0–36.0)
MCV: 81.3 fL (ref 80.0–100.0)
Platelets: 153 10*3/uL (ref 150–400)
RBC: 4.01 MIL/uL (ref 3.87–5.11)
RDW: 36.4 % — ABNORMAL HIGH (ref 11.5–15.5)
WBC: 13.9 10*3/uL — ABNORMAL HIGH (ref 4.0–10.5)
nRBC: 0 % (ref 0.0–0.2)

## 2022-03-31 LAB — COMPREHENSIVE METABOLIC PANEL
ALT: 44 U/L (ref 0–44)
AST: 24 U/L (ref 15–41)
Albumin: 3.4 g/dL — ABNORMAL LOW (ref 3.5–5.0)
Alkaline Phosphatase: 53 U/L (ref 38–126)
Anion gap: 8 (ref 5–15)
BUN: 11 mg/dL (ref 6–20)
CO2: 25 mmol/L (ref 22–32)
Calcium: 8.8 mg/dL — ABNORMAL LOW (ref 8.9–10.3)
Chloride: 100 mmol/L (ref 98–111)
Creatinine, Ser: 0.67 mg/dL (ref 0.44–1.00)
GFR, Estimated: >60 mL/min (ref >60)
Glucose, Bld: 104 mg/dL — ABNORMAL HIGH (ref 70–99)
Potassium: 3.9 mmol/L (ref 3.5–5.1)
Sodium: 133 mmol/L — ABNORMAL LOW (ref 135–145)
Total Bilirubin: 0.8 mg/dL (ref 0.3–1.2)
Total Protein: 7.4 g/dL (ref 6.5–8.1)

## 2022-03-31 LAB — URINALYSIS, ROUTINE W REFLEX MICROSCOPIC
Bacteria, UA: NONE SEEN
RBC / HPF: >50 RBC/hpf — ABNORMAL HIGH (ref 0–5)

## 2022-03-31 LAB — TYPE AND SCREEN
ABO/RH(D): AB POS
Antibody Screen: NEGATIVE

## 2022-03-31 LAB — I-STAT BETA HCG BLOOD, ED (MC, WL, AP ONLY): I-stat hCG, quantitative: <5 m[IU]/mL (ref <5)

## 2022-03-31 MED ORDER — SODIUM CHLORIDE 0.9% FLUSH: 10.0000 mL | Freq: Once | INTRAVENOUS | Status: AC

## 2022-03-31 MED ORDER — SODIUM CHLORIDE 0.9 % IV BOLUS
1000.0000 mL | Freq: Once | INTRAVENOUS | Status: AC
  Administered 2022-03-31: 1000 mL via INTRAVENOUS

## 2022-03-31 MED ORDER — ONDANSETRON HCL 4 MG/2ML IJ SOLN
4.0000 mg | Freq: Once | INTRAMUSCULAR | Status: AC
  Administered 2022-03-31: 4 mg via INTRAVENOUS
  Filled 2022-03-31: qty 2

## 2022-03-31 MED ORDER — MEGESTROL ACETATE 40 MG PO TABS: 40.0000 mg | ORAL_TABLET | Freq: Two times a day (BID) | ORAL | 0 refills | Status: DC

## 2022-03-31 MED ORDER — MEGESTROL ACETATE 40 MG PO TABS
40.0000 mg | ORAL_TABLET | Freq: Once | ORAL | Status: AC
  Administered 2022-03-31: 40 mg via ORAL
  Filled 2022-03-31: qty 1

## 2022-03-31 MED ORDER — IOHEXOL 350 MG/ML SOLN
75.0000 mL | Freq: Once | INTRAVENOUS | Status: AC | PRN
  Administered 2022-03-31: 75 mL via INTRAVENOUS

## 2022-03-31 MED ORDER — MORPHINE SULFATE (PF) 4 MG/ML IV SOLN
4.0000 mg | Freq: Once | INTRAVENOUS | Status: AC
  Administered 2022-03-31: 4 mg via INTRAVENOUS
  Filled 2022-03-31: qty 1

## 2022-03-31 NOTE — ED Provider Notes (Addendum)
[facility] DEPT Provider Note   CSN: [id number] Arrival date & time: 03/31/22  1825     History  Chief Complaint  Patient presents with   Vaginal Bleeding   Abdominal Pain    Tammy Garrison is a 31 y.o. female.  31 year old female with medical history significant for anal cancer undergoing radiation presents today for evaluation of heavy vaginal bleeding onset today.  She states she had light menstrual bleeding starting Sunday.  She initially assumed that this was her usual menstrual cycle as it is time for her monthly menstrual cycle.  She states as of today she noticed multiple large clots from her vaginal vault.  She also endorses lightheadedness.  Currently denies chest pain, shortness of breath.  States she has never had vaginal bleeding to the point she passed large blood clots.  She has required multiple blood transfusions in the past.  Currently tachycardic but this appears to be her baseline according to multiple recent outpatient visits.  The history is provided by the patient. No language interpreter was used.       Home Medications Prior to Admission medications   Medication Sig Start Date End Date Taking? Authorizing Provider  acetaminophen (TYLENOL) 500 MG tablet Take 2 tablets (1,000 mg total) by mouth every 6 (six) hours as needed. Patient not taking: Reported on 03/05/2022 02/09/22 02/09/23  Saverio Danker, PA-C  acetaminophen-codeine (TYLENOL #3) 300-30 MG tablet Take 1 tablet by mouth every 6 hours as needed for moderate pain. 03/24/22   Truitt Merle, MD  Ascorbic Acid (VITAMIN C PO) Take 1 tablet by mouth daily. Patient not taking: Reported on 03/05/2022    [provider]  dexamethasone (DECADRON) 4 MG tablet Take 1 tablet (4 mg total) by mouth daily. Take daily for up to 5 days after chemo. 03/13/22   Truitt Merle, MD  docusate sodium (COLACE) 100 MG capsule Take 1 capsule (100 mg total) by mouth 2 (two) times daily. Patient  not taking: Reported on 03/05/2022 02/09/22   Saverio Danker, PA-C  HYDROcodone-acetaminophen (NORCO/VICODIN) 5-325 MG tablet Take 1 tablet by mouth every 6 (six) hours as needed for moderate pain. Patient not taking: Reported on 03/05/2022 02/23/22   Truitt Merle, MD  Multiple Vitamins-Minerals (HAIR/SKIN/NAILS) CAPS Take 1 capsule by mouth daily. Patient not taking: Reported on 03/05/2022    [provider]  ondansetron (ZOFRAN-ODT) 8 MG disintegrating tablet Dissolve 1 tablet (8 mg total) by mouth every 8 (eight) hours as needed for nausea or vomiting. 03/13/22   Truitt Merle, MD  polyethylene glycol (MIRALAX / GLYCOLAX) 17 g packet Take 17 g by mouth 2 (two) times daily. Patient not taking: Reported on 03/05/2022 02/09/22   Saverio Danker, PA-C  promethazine (PHENERGAN) 25 MG tablet Take 1 tablet (25 mg total) by mouth every 6 (six) hours as needed for nausea or vomiting. 03/13/22   Truitt Merle, MD  trolamine salicylate (ASPERCREME) 10 % cream Apply 1 application topically as needed for muscle pain. Patient not taking: Reported on 03/05/2022    [provider]  VITAMIN D PO Take 1 capsule by mouth daily. Patient not taking: Reported on 03/05/2022    [provider]  prochlorperazine (COMPAZINE) 10 MG tablet Take 1 tablet (10 mg total) by mouth every 6 (six) hours as needed (Nausea or vomiting). 03/09/22 03/23/22  Truitt Merle, MD      Allergies    Coconut flavor    Review of Systems   Review of Systems  Constitutional:  Negative for activity change, chills and fever.  Respiratory:  Negative for shortness of breath.   Cardiovascular:  Negative for chest pain.  Gastrointestinal:  Positive for abdominal pain, nausea and vomiting. Negative for abdominal distention.  Genitourinary:  Positive for vaginal bleeding. Negative for dysuria and vaginal discharge.  Neurological:  Positive for light-headedness. Negative for weakness.  All other systems reviewed and are negative.   Physical  Exam Updated Vital Signs BP (!) 135/102   Pulse (!) 114   Temp 98.6 F (37 C) (Oral)   Resp 20   LMP 03/29/2022   SpO2 97%  Physical Exam Vitals and nursing note reviewed. Exam conducted with a chaperone present.  Constitutional:      General: She is not in acute distress.    Appearance: Normal appearance. She is not ill-appearing.  HENT:     Head: Normocephalic and atraumatic.     Nose: Nose normal.  Eyes:     General: No scleral icterus.    Extraocular Movements: Extraocular movements intact.     Conjunctiva/sclera: Conjunctivae normal.  Cardiovascular:     Rate and Rhythm: Regular rhythm. Tachycardia present.     Pulses: Normal pulses.  Pulmonary:     Effort: Pulmonary effort is normal. No respiratory distress.     Breath sounds: Normal breath sounds. No wheezing or rales.  Abdominal:     General: There is no distension.     Palpations: Abdomen is soft.     Tenderness: There is abdominal tenderness. There is no right CVA tenderness, left CVA tenderness or guarding.  Genitourinary:    Comments: Small blood clot noted.  Mild active bleed noted as well, from closed os. Musculoskeletal:        General: Normal range of motion.     Cervical back: Normal range of motion.  Skin:    General: Skin is warm and dry.  Neurological:     General: No focal deficit present.     Mental Status: She is alert. Mental status is at baseline.     ED Results / Procedures / Treatments   Labs (all labs ordered are listed, but only abnormal results are displayed) Labs Reviewed  CBC WITH DIFFERENTIAL/PLATELET  COMPREHENSIVE METABOLIC PANEL  URINALYSIS, ROUTINE W REFLEX MICROSCOPIC  I-STAT BETA HCG BLOOD, ED ([facility], [facility], [facility] ONLY)  TYPE AND SCREEN    EKG None  Radiology No results found.  Procedures Procedures    Medications Ordered in ED Medications - No data to display  ED Course/ Medical Decision Making/ A&P                           Medical Decision Making Amount and/or  Complexity of Data Reviewed Labs: ordered. Radiology: ordered.  Risk Prescription drug management.   Medical Decision Making / ED Course   This patient presents to the ED for concern of vaginal bleeding, this involves an extensive number of treatment options, and is a complaint that carries with it a high risk of complications and morbidity.  The differential diagnosis includes abnormal uterine bleeding, vaginal wall laceration  MDM: 31 year old female with past medical history significant for anal cancer on radiation presents today for evaluation of vaginal bleeding, and passing large clots.  She does endorse feeling lightheaded.  Denies chest pain, shortness of breath.  Endorses lower abdominal pain.  Patient is tachycardic however this is baseline for her on review of her recent outpatient visits.  Work-up shows CBC with  mild leukocytosis, hemoglobin of 10.3 which is slightly above her baseline.  CMP shows no acute findings.  UA is pending.  GC chlamydia collected.  Wet prep pending.  Pelvic ultrasound pending. Patient denies nausea, vomiting.  Having bowel movements.  Afebrile. Patient will need a repeat CBC at 11:40 PM.  This has been ordered.  Patient at the end of my shift is awaiting ultrasound read, and repeat CBC.  Patient has been signed out to oncoming provider Will PA-C for follow-up and disposition. Patient does not have a primary GYN.  Will provide referral to women's center.   Additional history obtained: -Additional history obtained from oncology clinic -External records from outside source obtained and reviewed including: Chart review including previous notes, labs, imaging, consultation notes   Lab Tests: -I ordered, reviewed, and interpreted labs.   The pertinent results include:   Labs Reviewed  CBC WITH DIFFERENTIAL/PLATELET - Abnormal; Notable for the following components:      Result Value   WBC 13.9 (*)    Hemoglobin 10.3 (*)    HCT 34.3 (*)    MCH 24.3 (*)     RDW 36.5 (*)    Neutro Abs 11.3 (*)    Lymphs Abs 0.5 (*)    Monocytes Absolute 2.0 (*)    Abs Immature Granulocytes 0.15 (*)    All other components within normal limits  COMPREHENSIVE METABOLIC PANEL - Abnormal; Notable for the following components:   Sodium 133 (*)    Glucose, Bld 104 (*)    Calcium 8.8 (*)    Albumin 3.4 (*)    All other components within normal limits  I-STAT CHEM 8, ED - Abnormal; Notable for the following components:   Sodium 133 (*)    Glucose, Bld 101 (*)    Hemoglobin 11.6 (*)    HCT 34.0 (*)    All other components within normal limits  WET PREP, GENITAL  LIPASE, BLOOD  URINALYSIS, ROUTINE W REFLEX MICROSCOPIC  I-STAT BETA HCG BLOOD, ED ([facility], [facility], [facility] ONLY)  TYPE AND SCREEN  GC/CHLAMYDIA PROBE AMP () NOT AT [facility]      EKG  EKG Interpretation  Date/Time:    Ventricular Rate:    PR Interval:    QRS Duration:   QT Interval:    QTC Calculation:   R Axis:     Text Interpretation:           Imaging Studies ordered: I ordered imaging studies including pelvic, transvaginal ultrasound I independently visualized and interpreted imaging. I agree with the radiologist interpretation   Medicines ordered and prescription drug management: Meds ordered this encounter  Medications   sodium chloride 0.9 % bolus 1,000 mL   morphine (PF) 4 MG/ML injection 4 mg   ondansetron (ZOFRAN) injection 4 mg   megestrol (MEGACE) tablet 40 mg    -I have reviewed the patients home medicines and have made adjustments as needed  Consultations Obtained: I requested consultation with the OB/GYN,  and discussed lab and imaging findings as well as pertinent plan - they recommend: If patient's repeat hemoglobin is stable patient is appropriate for discharge.  They recommend patient start on 40 mg twice daily Megace to help with the vaginal bleeding.   Cardiac Monitoring: The patient was maintained on a cardiac monitor.  I personally viewed and interpreted  the cardiac monitored which showed an underlying rhythm of: Sinus tachycardic  Reevaluation: After the interventions noted above, I reevaluated the patient and found that they have :improved  Co morbidities that complicate the patient evaluation  Past Medical History:  Diagnosis Date   Bipolar 1 disorder ([place])    ETOH abuse    H/O self-harm    H/O suicide attempt    x 5 - last 05/2016 - overdose Ibuprofen   Marijuana abuse    Rheumatoid arthritis ([place])       Dispostion: Patient at the end of my shift has been signed out to oncoming provider.  Final Clinical Impression(s) / ED Diagnoses Final diagnoses:  Vaginal bleeding    Rx / DC Orders ED Discharge Orders          Ordered    megestrol (MEGACE) 40 MG tablet  2 times daily        03/31/22 2216              Evlyn Courier, PA-C 03/31/22 2222    Evlyn Courier, PA-C 03/31/22 2231    Carmin Muskrat, MD 03/31/22 [phone]

## 2022-03-31 NOTE — ED Notes (Signed)
US tech at the bedside

## 2022-03-31 NOTE — ED Provider Notes (Signed)
Received at shift change from Evlyn Courier, PA-C please see note for full detail  Patient with medical history including anal cancer, status postradiation, starting chemotherapy presents  with complaints of vaginal bleeding, started on Sunday, has become heavier over the last couple days, states she went through multiple pads and tampons, states that she has passed multiple clots, she has intermittent vaginal cramping which is normal for her, no associated urinary symptoms no stomach pains nausea vomiting diarrhea no URI-like symptoms no fevers or chills, no abdominal history, she notes that her menstrual cycles are generally regular, they are normally heavy, she is not on birth control at this time.  Per previous provider follow-up on repeat H&H unremarkable patient can be discharged home if significant decrease in hemoglobin admit to medicine with repeat consultation with OB/GYN. Physical Exam  BP (!) 145/98   Pulse (!) 108   Temp 99.8 F (37.7 C) (Oral)   Resp 18   LMP 03/29/2022   SpO2 97%   Physical Exam Vitals and nursing note reviewed.  Constitutional:      General: She is not in acute distress.    Appearance: She is not ill-appearing.  HENT:     Head: Normocephalic and atraumatic.     Nose: No congestion.  Eyes:     Conjunctiva/sclera: Conjunctivae normal.  Cardiovascular:     Rate and Rhythm: Regular rhythm. Tachycardia present.     Pulses: Normal pulses.     Heart sounds: No murmur heard.    No friction rub. No gallop.  Pulmonary:     Effort: No respiratory distress.     Breath sounds: No wheezing, rhonchi or rales.  Abdominal:     General: There is no distension.     Palpations: Abdomen is soft.     Tenderness: There is abdominal tenderness. There is no right CVA tenderness or left CVA tenderness.     Comments: Abdomen nondistended, soft, slight tenderness on the lower pelvic region, without guarding rebound or peritoneal sign negative Murphy sign McBurney point.   Musculoskeletal:     Right lower leg: No edema.     Left lower leg: No edema.  Skin:    General: Skin is warm and dry.  Neurological:     Mental Status: She is alert.  Psychiatric:        Mood and Affect: Mood normal.     Procedures  Procedures  ED Course / MDM    Medical Decision Making Amount and/or Complexity of Data Reviewed Radiology: ordered.  Risk Prescription drug management.      Lab Tests:  I Ordered, and personally interpreted labs.  The pertinent results include: CBC leukocytosis of 13.9, hemoglobin 10.3, repeat CBC reveals white count of 13.9 with hemoglobin 9.8, CMP shows sodium 133 glucose 104 calcium 8.8, lipase 27, wet prep unremarkable GC chlamydia pending UA shows many red blood cells.   Imaging Studies ordered:  I ordered imaging studies including vaginal ultrasound, CT of chest I independently visualized and interpreted imaging which showed transvaginal ultrasound large fibroids, normal-appearing right ovary nonvisualized left ovary. I agree with the radiologist interpretation   Cardiac Monitoring:  The patient was maintained on a cardiac monitor.  I personally viewed and interpreted the cardiac monitored which showed an underlying rhythm of: N/A   Medicines ordered and prescription drug management:  I ordered medication including Megace I have reviewed the patients home medicines and have made adjustments as needed  Critical Interventions:  N/A   Reevaluation:  My assessment  patient is resting comfortably, having no complaints, nonsurgical abdomen, she is notably tachycardic, per previous provider has had history of this, but patient is endorsing to me that she feels more short of breath, due to her history of malignancy she is not an increased risk of PE, will obtain CTA of chest for rule out.    Consultations Obtained:  I requested consultation with the ***,  and discussed lab and imaging findings as well as pertinent plan -  they recommend: ***    Test Considered:  ***    Rule out ****    Dispostion and problem list  After consideration of the diagnostic results and the patients response to treatment, I feel that the patent would benefit from ***.

## 2022-03-31 NOTE — ED Triage Notes (Signed)
Pt BIB EMS from home. Pt had radiation today and went home and took a nap. Pt woke up and used the bathroom and passed 3 large blood clots. Pt endorses lower abdominal pain that radiates to back. Hx of rectal squamous cell cancer.   BP 155/100 HR 105 RR 22 100% RA Temp 97.7

## 2022-04-01 ENCOUNTER — Encounter: Payer: Self-pay | Admitting: Hematology

## 2022-04-01 ENCOUNTER — Ambulatory Visit
Admission: RE | Admit: 2022-04-01 | Discharge: 2022-04-01 | Disposition: A | Discharge status: Home / Self Care | Payer: Self-pay | Source: Ambulatory Visit | Attending: Radiation Oncology | Admitting: Radiation Oncology

## 2022-04-01 ENCOUNTER — Other Ambulatory Visit: Payer: Self-pay | Admitting: Hematology

## 2022-04-01 ENCOUNTER — Other Ambulatory Visit: Payer: Self-pay

## 2022-04-01 ENCOUNTER — Other Ambulatory Visit (HOSPITAL_COMMUNITY): Payer: Self-pay

## 2022-04-01 ENCOUNTER — Inpatient Hospital Stay: Payer: No Typology Code available for payment source

## 2022-04-01 DIAGNOSIS — R112 Nausea with vomiting, unspecified: Secondary | ICD-10-CM

## 2022-04-01 DIAGNOSIS — D500 Iron deficiency anemia secondary to blood loss (chronic): Secondary | ICD-10-CM

## 2022-04-01 DIAGNOSIS — Z452 Encounter for adjustment and management of vascular access device: Secondary | ICD-10-CM

## 2022-04-01 DIAGNOSIS — E860 Dehydration: Secondary | ICD-10-CM

## 2022-04-01 LAB — RAD ONC ARIA SESSION SUMMARY
Course Elapsed Days: 23
Plan Fractions Treated to Date: 16
Plan Prescribed Dose Per Fraction: 1.8 Gy
Plan Total Fractions Prescribed: 28
Plan Total Prescribed Dose: 50.4 Gy
Reference Point Dosage Given to Date: 32.4 Gy
Reference Point Session Dosage Given: 1.8 Gy
Session Number: 18

## 2022-04-01 LAB — GC/CHLAMYDIA PROBE AMP (~~LOC~~) NOT AT ARMC
Chlamydia: NEGATIVE
Comment: NEGATIVE
Comment: NORMAL
Neisseria Gonorrhea: NEGATIVE

## 2022-04-01 MED ORDER — HEPARIN SOD (PORK) LOCK FLUSH 100 UNIT/ML IV SOLN
250.0000 [IU] | Freq: Once | INTRAVENOUS | Status: AC
  Administered 2022-04-01: 250 [IU]

## 2022-04-01 MED ORDER — SODIUM CHLORIDE 0.9% FLUSH
10.0000 mL | Freq: Once | INTRAVENOUS | Status: AC
  Administered 2022-04-01: 10 mL

## 2022-04-01 MED ORDER — MEGESTROL ACETATE 40 MG PO TABS
40.0000 mg | ORAL_TABLET | Freq: Two times a day (BID) | ORAL | 0 refills | Status: DC
  Filled 2022-04-01: qty 28, 14d supply, fill #0

## 2022-04-01 MED ORDER — HYDROCODONE-ACETAMINOPHEN 5-325 MG PO TABS
1.0000 | ORAL_TABLET | Freq: Four times a day (QID) | ORAL | 0 refills | Status: DC | PRN
  Filled 2022-04-01: qty 30, 8d supply, fill #0

## 2022-04-01 NOTE — ED Provider Notes (Incomplete)
Received at shift change from Evlyn Courier, PA-C please see note for full detail  Patient with medical history including anal cancer, status postradiation, starting chemotherapy presents  with complaints of vaginal bleeding, started on Sunday, has become heavier over the last couple days, states she went through multiple pads and tampons, states that she has passed multiple clots, she has intermittent vaginal cramping which is normal for her, no associated urinary symptoms no stomach pains nausea vomiting diarrhea no URI-like symptoms no fevers or chills, no abdominal history, she notes that her menstrual cycles are generally regular, they are normally heavy, she is not on birth control at this time.  Per previous provider follow-up on repeat H&H unremarkable patient can be discharged home if significant decrease in hemoglobin admit to medicine with repeat consultation with OB/GYN. Physical Exam  BP (!) 145/98   Pulse (!) 108   Temp 99.8 F (37.7 C) (Oral)   Resp 18   LMP 03/29/2022   SpO2 97%   Physical Exam Vitals and nursing note reviewed.  Constitutional:      General: She is not in acute distress.    Appearance: She is not ill-appearing.  HENT:     Head: Normocephalic and atraumatic.     Nose: No congestion.  Eyes:     Conjunctiva/sclera: Conjunctivae normal.  Cardiovascular:     Rate and Rhythm: Regular rhythm. Tachycardia present.     Pulses: Normal pulses.     Heart sounds: No murmur heard.    No friction rub. No gallop.  Pulmonary:     Effort: No respiratory distress.     Breath sounds: No wheezing, rhonchi or rales.  Abdominal:     General: There is no distension.     Palpations: Abdomen is soft.     Tenderness: There is abdominal tenderness. There is no right CVA tenderness or left CVA tenderness.     Comments: Abdomen nondistended, soft, slight tenderness on the lower pelvic region, without guarding rebound or peritoneal sign negative Murphy sign McBurney point.   Musculoskeletal:     Right lower leg: No edema.     Left lower leg: No edema.  Skin:    General: Skin is warm and dry.  Neurological:     Mental Status: She is alert.  Psychiatric:        Mood and Affect: Mood normal.     Procedures  Procedures  ED Course / MDM    Medical Decision Making Amount and/or Complexity of Data Reviewed Radiology: ordered.  Risk Prescription drug management.      Lab Tests:  I Ordered, and personally interpreted labs.  The pertinent results include: CBC leukocytosis of 13.9, hemoglobin 10.3, repeat CBC reveals white count of 13.9 with hemoglobin 9.8, CMP shows sodium 133 glucose 104 calcium 8.8, lipase 27, wet prep unremarkable GC chlamydia pending UA shows many red blood cells.   Imaging Studies ordered:  I ordered imaging studies including vaginal ultrasound, CT of chest I independently visualized and interpreted imaging which showed transvaginal ultrasound large fibroids, normal-appearing right ovary nonvisualized left ovary.  CTA is negative at this time I agree with the radiologist interpretation   Cardiac Monitoring:  The patient was maintained on a cardiac monitor.  I personally viewed and interpreted the cardiac monitored which showed an underlying rhythm of: N/A   Medicines ordered and prescription drug management:  I ordered medication including Megace I have reviewed the patients home medicines and have made adjustments as needed  Critical Interventions:  N/A   Reevaluation:  My assessment patient is resting comfortably, having no complaints, nonsurgical abdomen, she is notably tachycardic, per previous provider has had history of this, but patient is endorsing to me that she feels more short of breath, due to her history of malignancy she is not an increased risk of PE, will obtain CTA of chest for rule out.  Patient was reassessed resting comfortably having no complaints she is agreement plan discharge at this  time.    Consultations Obtained:  N/A   Test Considered:  CT abdomen and pelvis-deferred as my suspicion for intra-abdominal abnormality is low at this time, she has nonsurgical abdomen, pain is mainly in the lower pelvic region which seems consistent with noted fibroids as seen on ultrasound.    Rule out Low suspicion for PE or dissection as CT imaging is negative for this.  I have low suspicion for liver or gallbladder abnormality as she has no right upper quadrant tenderness, liver enzymes, alk phos, T bili all within normal limits.  Low suspicion for pancreatitis as lipase is within normal limits.  Low suspicion for ruptured stomach ulcer as she has no peritoneal sign present on exam.  Low suspicion for bowel obstruction as abdomen is nondistended normal bowel sounds, so passing gas and having normal bowel movements.  Low suspicion for complicated diverticulitis as she is nontoxic-appearing, no associated nausea vomiting diarrhea, does have an elevated heart rate as well as white count but I suspect the white count is acute phase reaction from continued vaginal bleeding and patient's elevated tachycardia has been consistent for her.  Low suspicion for appendicitis as she has no right lower quadrant tenderness.  I doubt ovarian torsion presentation is atypical, ultrasound is negative for this.     Dispostion and problem list  After consideration of the diagnostic results and the patients response to treatment, I feel that the patent would benefit from discharge.  Vaginal bleeding-likely this is dysfunctional vaginal bleeding secondary due to fibroids, patient will be placed on Megace, will have her follow-up with OB for further evaluation and strict return precautions.

## 2022-04-01 NOTE — Discharge Instructions (Signed)
Suspect her vaginal bleeding is from the fibroids seen on ultrasound, will be starting on Megace please take as prescribed, continue take this medication 2 days after you stopped vaginal bleeding.  It is important they follow-up with OB please follow-up with the women's clinic.  If you continue have significant vaginal bleeding become lightheaded dizziness chest pain shortness of breath generalized fatigue please come back in for reassessment.

## 2022-04-02 ENCOUNTER — Inpatient Hospital Stay: Payer: No Typology Code available for payment source

## 2022-04-02 ENCOUNTER — Other Ambulatory Visit: Payer: Self-pay

## 2022-04-02 ENCOUNTER — Other Ambulatory Visit (HOSPITAL_COMMUNITY): Payer: Self-pay

## 2022-04-02 ENCOUNTER — Ambulatory Visit
Admission: RE | Admit: 2022-04-02 | Discharge: 2022-04-02 | Disposition: A | Discharge status: Home / Self Care | Payer: Self-pay | Source: Ambulatory Visit | Attending: Radiation Oncology | Admitting: Radiation Oncology

## 2022-04-02 ENCOUNTER — Inpatient Hospital Stay (HOSPITAL_BASED_OUTPATIENT_CLINIC_OR_DEPARTMENT_OTHER): Payer: No Typology Code available for payment source | Admitting: Physician Assistant

## 2022-04-02 ENCOUNTER — Encounter: Payer: Self-pay | Admitting: Family Medicine

## 2022-04-02 ENCOUNTER — Encounter: Payer: Self-pay | Admitting: Hematology

## 2022-04-02 ENCOUNTER — Encounter: Payer: No Typology Code available for payment source | Admitting: Physical Therapy

## 2022-04-02 VITALS — BP 129/83 | HR 132 | Resp 20

## 2022-04-02 VITALS — BP 132/82 | HR 108 | Temp 98.3°F | Resp 18

## 2022-04-02 VITALS — BP 106/68 | Resp 16

## 2022-04-02 DIAGNOSIS — R112 Nausea with vomiting, unspecified: Secondary | ICD-10-CM

## 2022-04-02 DIAGNOSIS — D500 Iron deficiency anemia secondary to blood loss (chronic): Secondary | ICD-10-CM

## 2022-04-02 DIAGNOSIS — E860 Dehydration: Secondary | ICD-10-CM

## 2022-04-02 DIAGNOSIS — C210 Malignant neoplasm of anus, unspecified: Secondary | ICD-10-CM

## 2022-04-02 DIAGNOSIS — Z452 Encounter for adjustment and management of vascular access device: Secondary | ICD-10-CM

## 2022-04-02 DIAGNOSIS — N939 Abnormal uterine and vaginal bleeding, unspecified: Secondary | ICD-10-CM

## 2022-04-02 LAB — CBC WITH DIFFERENTIAL/PLATELET
Abs Immature Granulocytes: 0.09 10*3/uL — ABNORMAL HIGH (ref 0.00–0.07)
Basophils Absolute: 0 10*3/uL (ref 0.0–0.1)
Basophils Relative: 0 %
Eosinophils Absolute: 0 10*3/uL (ref 0.0–0.5)
Eosinophils Relative: 0 %
HCT: 32.1 % — ABNORMAL LOW (ref 36.0–46.0)
Hemoglobin: 10.1 g/dL — ABNORMAL LOW (ref 12.0–15.0)
Immature Granulocytes: 1 %
Lymphocytes Relative: 2 %
Lymphs Abs: 0.3 10*3/uL — ABNORMAL LOW (ref 0.7–4.0)
MCH: 24.7 pg — ABNORMAL LOW (ref 26.0–34.0)
MCHC: 31.5 g/dL (ref 30.0–36.0)
MCV: 78.5 fL — ABNORMAL LOW (ref 80.0–100.0)
Monocytes Absolute: 1.7 10*3/uL — ABNORMAL HIGH (ref 0.1–1.0)
Monocytes Relative: 10 %
Neutro Abs: 15.1 10*3/uL — ABNORMAL HIGH (ref 1.7–7.7)
Neutrophils Relative %: 87 %
Platelets: 163 10*3/uL (ref 150–400)
RBC: 4.09 MIL/uL (ref 3.87–5.11)
RDW: 35.8 % — ABNORMAL HIGH (ref 11.5–15.5)
WBC: 17.2 10*3/uL — ABNORMAL HIGH (ref 4.0–10.5)
nRBC: 0 % (ref 0.0–0.2)

## 2022-04-02 LAB — RAD ONC ARIA SESSION SUMMARY
Course Elapsed Days: 24
Plan Fractions Treated to Date: 17
Plan Prescribed Dose Per Fraction: 1.8 Gy
Plan Total Fractions Prescribed: 28
Plan Total Prescribed Dose: 50.4 Gy
Reference Point Dosage Given to Date: 34.2 Gy
Reference Point Session Dosage Given: 1.8 Gy
Session Number: 19

## 2022-04-02 LAB — IRON AND IRON BINDING CAPACITY (CC-WL,HP ONLY)
Iron: 72 ug/dL (ref 28–170)
Saturation Ratios: 19 % (ref 10.4–31.8)
TIBC: 389 ug/dL (ref 250–450)
UIBC: 317 ug/dL (ref 148–442)

## 2022-04-02 LAB — COMPREHENSIVE METABOLIC PANEL
ALT: 99 U/L — ABNORMAL HIGH (ref 0–44)
AST: 45 U/L — ABNORMAL HIGH (ref 15–41)
Albumin: 3.8 g/dL (ref 3.5–5.0)
Alkaline Phosphatase: 65 U/L (ref 38–126)
Anion gap: 4 — ABNORMAL LOW (ref 5–15)
BUN: 15 mg/dL (ref 6–20)
CO2: 28 mmol/L (ref 22–32)
Calcium: 9.6 mg/dL (ref 8.9–10.3)
Chloride: 104 mmol/L (ref 98–111)
Creatinine, Ser: 0.71 mg/dL (ref 0.44–1.00)
GFR, Estimated: >60 mL/min (ref >60)
Glucose, Bld: 137 mg/dL — ABNORMAL HIGH (ref 70–99)
Potassium: 4.2 mmol/L (ref 3.5–5.1)
Sodium: 136 mmol/L (ref 135–145)
Total Bilirubin: 0.3 mg/dL (ref 0.3–1.2)
Total Protein: 7.7 g/dL (ref 6.5–8.1)

## 2022-04-02 LAB — FERRITIN: Ferritin: 227 ng/mL (ref 11–307)

## 2022-04-02 MED ORDER — ONDANSETRON HCL 4 MG/2ML IJ SOLN
4.0000 mg | Freq: Once | INTRAMUSCULAR | Status: AC
  Administered 2022-04-02: 4 mg via INTRAVENOUS
  Filled 2022-04-02: qty 2

## 2022-04-02 MED ORDER — SODIUM CHLORIDE 0.9% FLUSH
10.0000 mL | Freq: Once | INTRAVENOUS | Status: AC
  Administered 2022-04-02: 10 mL

## 2022-04-02 MED ORDER — SODIUM CHLORIDE 0.9 % IV SOLN: Freq: Once | INTRAVENOUS | Status: AC

## 2022-04-02 MED ORDER — HEPARIN SOD (PORK) LOCK FLUSH 100 UNIT/ML IV SOLN
500.0000 [IU] | Freq: Once | INTRAVENOUS | Status: AC
  Administered 2022-04-02: 500 [IU]

## 2022-04-02 MED ORDER — MORPHINE SULFATE (PF) 4 MG/ML IV SOLN
4.0000 mg | Freq: Once | INTRAVENOUS | Status: AC
  Administered 2022-04-02: 4 mg via INTRAVENOUS
  Filled 2022-04-02: qty 1

## 2022-04-02 MED ORDER — SODIUM CHLORIDE 0.9% FLUSH: 10.0000 mL | Freq: Once | INTRAVENOUS | Status: DC

## 2022-04-02 NOTE — Patient Instructions (Signed)

## 2022-04-02 NOTE — Progress Notes (Signed)
Symptom Management Consult note [place]    Patient Care Team: Pcp, No as PCP - General [name], MD (Rheumatology) Tyson Dense, MD as Consulting Physician (Obstetrics and Gynecology) Truitt Merle, MD as Consulting Physician (Hematology and Oncology)    Name of the patient: Tammy Garrison  [id number]  [birth year]   Date of visit: 04/02/2022    Chief complaint/ Reason for visit- vaginal bleeding and abdominal pain  Oncology History  Anal cancer ([place])  02/08/2022 Pathology Results   FINAL MICROSCOPIC DIAGNOSIS:   A. SOFT TISSUE, ANTERIOR MASS VS. HEMORRHOID, EXCISION:  Invasive squamous cell carcinoma, focally keratinizing and  well-differentiated.  Carcinoma is present at the margins of excision.   B. HEMORRHOID, INTERNAL, EXCISION:  Hemorrhoid without the overlying epithelium.  Separate fragment of benign squamous epithelium.  Negative for neoplasm.   ADDENDUM:  P16 immunostain is strongly positive suggestive of this lesion being the HPV related.    02/08/2022 Cancer Staging   Staging form: Anus, AJCC V9 - Clinical stage from 02/08/2022: Stage IIA (cT2, cN0, cM0) - Signed by Truitt Merle, MD on 02/23/2022 Stage prefix: Initial diagnosis   02/23/2022 Initial Diagnosis   Anal cancer ([place])   03/09/2022 - 03/13/2022 Chemotherapy   Patient is on Treatment Plan : ANUS Mitomycin D1,28 / 5FU D1-4, 28-31 q32d     03/09/2022 -  Chemotherapy   Patient is on Treatment Plan : ANUS Mitomycin D1,28 + 5FU D1-4, 28-31 q32d       Current Therapy: Neoadjuvant concurrent chemoRT, starting 03/09/22   -mitomycin on day 1, 28    -5FU on days 1-5, 28-31  Last treatment:   03/09/22   Day 1   Cycle 1  Interval history- Tammy Garrison is a 31 y.o.  G0P0with oncologic history as above presenting to [facility] today with chief complaint of vaginal bleeding x5 days.  Patient states she has monthly regular menstrual cycles.  They are typically heavy although only last  5 to 6 days.  It is unusual for her to be bleeding heavy on day 5 as she is now.  She admits for the last 3 days to be passing large clots that she describes as baseball size.  She was seen in the ED 03/31/2022 for the same and had pelvic and vaginal ultrasound showing multiple uterine fibroids.  Patient states her mother is having a hysterectomy because of fibroids.  She was prescribed Megace by ED provider which she has not yet picked up from the pharmacy although she tells me she plans to pick it up today. She is not established with gynecologist and was given referral for the [facility].  Patient is not currently on birth control.  She does admit to abdominal cramping and feeling light headed, both symptoms are intermittent and worse with activity ongoing x 5 days. She describes the cramping as located in her pelvis with 7/10 severity. She has not taken anything PTA for pain. She has tramadol at home which does not typically help cramps she reports. She admits to decreased PO intake over the last several days because she felt so poorly. She denies fever, chills, urinary symptoms.     ROS  All other systems are reviewed and are negative for acute change except as noted in the HPI.    Allergies  Allergen Reactions   Coconut Flavor Itching and Rash    Itchy throat, rash,itching      Past Medical History:  Diagnosis Date  Bipolar 1 disorder ([place])    ETOH abuse    H/O self-harm    H/O suicide attempt    x 5 - last 05/2016 - overdose Ibuprofen   Marijuana abuse    Rheumatoid arthritis ([place])      Past Surgical History:  Procedure Laterality Date   HEMORRHOID SURGERY N/A 02/08/2022   Procedure: EXTERNAL AND INTERNAL HEMORRHOIDECTOMY;  Surgeon: Georganna Skeans, MD;  Location: [place];  Service: General;  Laterality: N/A;    Social History   Socioeconomic History   Marital status: Single    Spouse name: Not on file   Number of children: 0   Years of education: Not on file    Highest education level: Not on file  Occupational History   Occupation: call center   Tobacco Use   Smoking status: Former    Packs/day: 0.25    Years: 7.00    Total pack years: 1.75    Types: Cigarettes    Quit date: 02/17/2019    Years since quitting: 3.1   Smokeless tobacco: Never  Vaping Use   Vaping Use: Every day   Substances: Nicotine  Substance and Sexual Activity   Alcohol use: Not Currently    Comment: social   Drug use: Not Currently    Types: Marijuana   Sexual activity: Yes    Birth control/protection: None  Other Topics Concern   Not on file  Social History Narrative   Not on file   Social Determinants of Health   Financial Resource Strain: High Risk (02/26/2022)   Overall Financial Resource Strain (CARDIA)    Difficulty of Paying Living Expenses: Very hard  Food Insecurity: Food Insecurity Present (03/05/2022)   Hunger Vital Sign    Worried About Running Out of Food in the Last Year: Often true    Ran Out of Food in the Last Year: Often true  Transportation Needs: Unmet Transportation Needs (03/05/2022)   PRAPARE - Hydrologist (Medical): Yes    Lack of Transportation (Non-Medical): Yes  Physical Activity: Not on file  Stress: Not on file  Social Connections: Not on file  Intimate Partner Violence: Not on file    Family History  Problem Relation Age of Onset   Hypertension Other    Lupus Mother      Current Outpatient Medications:    acetaminophen (TYLENOL) 500 MG tablet, Take 2 tablets (1,000 mg total) by mouth every 6 (six) hours as needed. (Patient not taking: Reported on 03/05/2022), Disp: 100 tablet, Rfl: 2   acetaminophen-codeine (TYLENOL #3) 300-30 MG tablet, Take 1 tablet by mouth every 6 hours as needed for moderate pain., Disp: 30 tablet, Rfl: 0   Ascorbic Acid (VITAMIN C PO), Take 1 tablet by mouth daily. (Patient not taking: Reported on 03/05/2022), Disp: , Rfl:    dexamethasone (DECADRON) 4 MG tablet, Take 1  tablet (4 mg total) by mouth daily. Take daily for up to 5 days after chemo., Disp: 30 tablet, Rfl: 0   docusate sodium (COLACE) 100 MG capsule, Take 1 capsule (100 mg total) by mouth 2 (two) times daily. (Patient not taking: Reported on 03/05/2022), Disp: 10 capsule, Rfl: 0   HYDROcodone-acetaminophen (NORCO/VICODIN) 5-325 MG tablet, Take 1 tablet by mouth every 6 (six) hours as needed for moderate pain., Disp: 30 tablet, Rfl: 0   megestrol (MEGACE) 40 MG tablet, Take 1 tablet (40 mg total) by mouth 2 (two) times daily., Disp: 28 tablet, Rfl: 0   Multiple  Vitamins-Minerals (HAIR/SKIN/NAILS) CAPS, Take 1 capsule by mouth daily. (Patient not taking: Reported on 03/05/2022), Disp: , Rfl:    ondansetron (ZOFRAN-ODT) 8 MG disintegrating tablet, Dissolve 1 tablet (8 mg total) by mouth every 8 (eight) hours as needed for nausea or vomiting., Disp: 20 tablet, Rfl: 1   polyethylene glycol (MIRALAX / GLYCOLAX) 17 g packet, Take 17 g by mouth 2 (two) times daily. (Patient not taking: Reported on 03/05/2022), Disp: 14 each, Rfl: 0   promethazine (PHENERGAN) 25 MG tablet, Take 1 tablet (25 mg total) by mouth every 6 (six) hours as needed for nausea or vomiting., Disp: 30 tablet, Rfl: 1   trolamine salicylate (ASPERCREME) 10 % cream, Apply 1 application topically as needed for muscle pain. (Patient not taking: Reported on 03/05/2022), Disp: , Rfl:    VITAMIN D PO, Take 1 capsule by mouth daily. (Patient not taking: Reported on 03/05/2022), Disp: , Rfl:  No current facility-administered medications for this visit.  Facility-Administered Medications Ordered in Other Visits:    0.9 %  sodium chloride infusion (Manually program via Guardrails IV Fluids), 250 mL, Intravenous, Once, Truitt Merle, MD   sodium chloride flush (NS) 0.9 % injection 10 mL, 10 mL, Intracatheter, Once, Truitt Merle, MD  PHYSICAL EXAM: ECOG FS:1 - Symptomatic but completely ambulatory    Vitals:   04/02/22 0955 04/02/22 1109  BP: 115/85 132/82  Pulse:  (!) 125 (!) 108  Resp: 20 18  Temp: 98.3 F (36.8 C)   TempSrc: Oral   SpO2: 99% 100%   Physical Exam Vitals and nursing note reviewed.  Constitutional:      Appearance: She is well-developed. She is not ill-appearing or toxic-appearing.  HENT:     Head: Normocephalic and atraumatic.     Nose: Nose normal.     Mouth/Throat:     Mouth: Mucous membranes are dry.  Eyes:     General: No scleral icterus.       Right eye: No discharge.        Left eye: No discharge.     Conjunctiva/sclera: Conjunctivae normal.  Neck:     Vascular: No JVD.  Cardiovascular:     Rate and Rhythm: Regular rhythm. Tachycardia present.     Pulses: Normal pulses.     Heart sounds: Normal heart sounds.  Pulmonary:     Effort: Pulmonary effort is normal.     Breath sounds: Normal breath sounds.  Abdominal:     General: There is no distension.     Palpations: Abdomen is soft. There is no mass.     Tenderness: There is no abdominal tenderness. There is no guarding or rebound.     Hernia: No hernia is present.  Musculoskeletal:        General: Normal range of motion.     Cervical back: Normal range of motion.     Right lower leg: No edema.     Left lower leg: No edema.  Skin:    General: Skin is warm and dry.  Neurological:     Mental Status: She is oriented to person, place, and time.     GCS: GCS eye subscore is 4. GCS verbal subscore is 5. GCS motor subscore is 6.     Comments: Fluent speech, no facial droop.  Psychiatric:        Behavior: Behavior normal.        LABORATORY DATA: I have reviewed the data as listed    Latest Ref Rng & Units 04/02/2022  9:26 AM 03/31/2022   11:06 PM 03/31/2022    7:52 PM  CBC  WBC 4.0 - 10.5 K/uL 17.2  13.9    Hemoglobin 12.0 - 15.0 g/dL 10.1  9.8  11.6   Hematocrit 36.0 - 46.0 % 32.1  32.6  34.0   Platelets 150 - 400 K/uL 163  153          Latest Ref Rng & Units 04/02/2022    9:26 AM 03/31/2022    7:52 PM 03/31/2022    7:41 PM  CMP  Glucose 70 -  99 mg/dL 137  101  104   BUN 6 - 20 mg/dL '15  11  11   '$ Creatinine 0.44 - 1.00 mg/dL 0.71  0.60  0.67   Sodium 135 - 145 mmol/L 136  133  133   Potassium 3.5 - 5.1 mmol/L 4.2  3.9  3.9   Chloride 98 - 111 mmol/L 104  99  100   CO2 22 - 32 mmol/L 28   25   Calcium 8.9 - 10.3 mg/dL 9.6   8.8   Total Protein 6.5 - 8.1 g/dL 7.7   7.4   Total Bilirubin 0.3 - 1.2 mg/dL 0.3   0.8   Alkaline Phos 38 - 126 U/L 65   53   AST 15 - 41 U/L 45   24   ALT 0 - 44 U/L 99   44        RADIOGRAPHIC STUDIES (from last 24 hours if applicable) I have personally reviewed the radiological images as listed and agreed with the findings in the report. No results found.     ASSESSMENT & PLAN: Patient is a 31 y.o. female  with oncologic history of stage IIA anal cancer followed by Dr. Burr Medico.  I have viewed most recent oncology note and lab work.   #)Vaginal bleeding -Multiple fibroids seen on recent US.  -Abdominal exam is benign. Patient given IVF, morphine and zofran for symptoms. Vitals rechecked after interventions and tachycardia significantly improved. She is feeling better and serial abdominal exams are benign. -CBC today with normal platelets, has hemoglobin 10.1. No indication for transfusion at this time. Iron study shows Iron today is 72. -Patient no established with GYN. I consulted on call provider for [facility] and discussed case with Dr. Dione Plover. He viewed recent US imaging and is recommending evaluation at clinic for likely hormonal IUD placement and/or IR embolization if warranted based on symptoms at time of eval. He placed internal referral and clinic staff will reach out to patient to schedule. From oncology standpoint she is appropriate candidate for IUD as there is no hormone component with rectal cancer. I appreciate WUX'L assistance with this patient. -Patient updated on plan and is agreeable. -She will try Megace incase there is component of chronic bleeding which could improve although if this  is acute there will likely be no benefit. -Strict ED precautions discussed should symptoms worsen.   #)Stage IIA anal cancer - Next appointment with oncologist is 04/07/22   Visit Diagnosis: 1. PICC (peripherally inserted central catheter) in place   2. Dehydration   3. Nausea and vomiting, unspecified vomiting type   4. Iron deficiency anemia secondary to blood loss (chronic)   5. Vaginal bleeding      Orders Placed This Encounter  Procedures   Iron and Iron Binding Capacity ([facility] only)    All questions were answered. The patient knows to call the clinic with any problems, questions or concerns. No  barriers to learning was detected.  I have spent a total of 30 minutes minutes of face-to-face and non-face-to-face time, preparing to see the patient, obtaining and/or reviewing separately obtained history, performing a medically appropriate examination, counseling and educating the patient, ordering tests, documenting clinical information in the electronic health record, and care coordination (communications with other health care professionals or caregivers).    Thank you for allowing me to participate in the care of this patient.    Barrie Folk, PA-C Department of Hematology/Oncology [facility] at [facility] Phone: [phone]  [phone]    04/02/2022 12:07 PM

## 2022-04-02 NOTE — Patient Instructions (Signed)
Rehydration, Adult Rehydration is the replacement of body fluids, salts, and minerals (electrolytes) that are lost during dehydration. Dehydration is when there is not enough water or other fluids in the body. This happens when you lose more fluids than you take in. Common causes of dehydration include: Not drinking enough fluids. This can occur when you are ill or doing activities that require a lot of energy, especially in hot weather. Conditions that cause loss of water or other fluids, such as diarrhea, vomiting, sweating, or urinating a lot. Other illnesses, such as fever or infection. Certain medicines, such as those that remove excess fluid from the body (diuretics). Symptoms of mild or moderate dehydration may include thirst, dry lips and mouth, and dizziness. Symptoms of severe dehydration may include increased heart rate, confusion, fainting, and not urinating. For severe dehydration, you may need to get fluids through an IV at the hospital. For mild or moderate dehydration, you can usually rehydrate at home by drinking certain fluids as told by your health care provider. What are the risks? Generally, rehydration is safe. However, taking in too much fluid (overhydration) can be a problem. This is rare. Overhydration can cause an electrolyte imbalance, kidney failure, or a decrease in salt (sodium) levels in the body. Supplies needed You will need an oral rehydration solution (ORS) if your health care provider tells you to use one. This is a drink to treat dehydration. It can be found in pharmacies and retail stores. How to rehydrate Fluids Follow instructions from your health care provider for rehydration. The kind of fluid and the amount you should drink depend on your condition. In general, you should choose drinks that you prefer. If told by your health care provider, drink an ORS. Make an ORS by following instructions on the package. Start by drinking small amounts, about  cup (120  mL) every 5-10 minutes. Slowly increase how much you drink until you have taken the amount recommended by your health care provider. Drink enough clear fluids to keep your urine pale yellow. If you were told to drink an ORS, finish it first, then start slowly drinking other clear fluids. Drink fluids such as: Water. This includes sparkling water and flavored water. Drinking only water can lead to having too little sodium in your body (hyponatremia). Follow the advice of your health care provider. Water from ice chips you suck on. Fruit juice with water you add to it (diluted). Sports drinks. Hot or cold herbal teas. Broth-based soups. Milk or milk products. Food Follow instructions from your health care provider about what to eat while you rehydrate. Your health care provider may recommend that you slowly begin eating regular foods in small amounts. Eat foods that contain a healthy balance of electrolytes, such as bananas, oranges, potatoes, tomatoes, and spinach. Avoid foods that are greasy or contain a lot of sugar. In some cases, you may get nutrition through a feeding tube that is passed through your nose and into your stomach (nasogastric tube, or NG tube). This may be done if you have uncontrolled vomiting or diarrhea. Beverages to avoid  Certain beverages may make dehydration worse. While you rehydrate, avoid drinking alcohol. How to tell if you are recovering from dehydration You may be recovering from dehydration if: You are urinating more often than before you started rehydrating. Your urine is pale yellow. Your energy level improves. You vomit less frequently. You have diarrhea less frequently. Your appetite improves or returns to normal. You feel less dizzy or less light-headed.   Your skin tone and color start to look more normal. Follow these instructions at home: Take over-the-counter and prescription medicines only as told by your health care provider. Do not take sodium  tablets. Doing this can lead to having too much sodium in your body (hypernatremia). Contact a health care provider if: You continue to have symptoms of mild or moderate dehydration, such as: Thirst. Dry lips. Slightly dry mouth. Dizziness. Dark urine or less urine than normal. Muscle cramps. You continue to vomit or have diarrhea. Get help right away if you: Have symptoms of dehydration that get worse. Have a fever. Have a severe headache. Have been vomiting and the following happens: Your vomiting gets worse or does not go away. Your vomit includes blood or green matter (bile). You cannot eat or drink without vomiting. Have problems with urination or bowel movements, such as: Diarrhea that gets worse or does not go away. Blood in your stool (feces). This may cause stool to look black and tarry. Not urinating, or urinating only a small amount of very dark urine, within 6-8 hours. Have trouble breathing. Have symptoms that get worse with treatment. These symptoms may represent a serious problem that is an emergency. Do not wait to see if the symptoms will go away. Get medical help right away. Call your local emergency services (911 in the [place] Do not drive yourself to the hospital. Summary Rehydration is the replacement of body fluids and minerals (electrolytes) that are lost during dehydration. Follow instructions from your health care provider for rehydration. The kind of fluid and amount you should drink depend on your condition. Slowly increase how much you drink until you have taken the amount recommended by your health care provider. Contact your health care provider if you continue to show signs of mild or moderate dehydration. This information is not intended to replace advice given to you by your health care provider. Make sure you discuss any questions you have with your health care provider. Document Revised: 09/20/2019 Document Reviewed: 07/31/2019 Elsevier Patient  Education  2023 [employer] Inc.  

## 2022-04-02 NOTE — Progress Notes (Signed)
Contacted by Sherol Dade PA at the cancer center at [facility], she is currently receiving treatment there for rectal cancer. Reports history of DUB with multiple large fibroids and heavy menses but does not have any OBGYN follow up.  Images briefly reviewed, has several moderate sized fibroids, largest about 8 cm.   Discussed with provider that I will send a message to [facility] to schedule her for a new GYN appointment, however likely her best option given ongoing treatment for malignancy would be to get a hormonal IUD to help control her DUB. Surgery or uterine artery embolization are not ideal at this time. Could also consider OCP's if no contraindication with her current chemo though given she is high risk for embolism this is a much less ideal option.   Clarnce Flock, MD/MPH Attending Family Medicine Physician, [facility], [place]

## 2022-04-03 ENCOUNTER — Other Ambulatory Visit: Payer: Self-pay

## 2022-04-03 ENCOUNTER — Ambulatory Visit
Admission: RE | Admit: 2022-04-03 | Discharge: 2022-04-03 | Disposition: A | Discharge status: Home / Self Care | Payer: No Typology Code available for payment source | Source: Ambulatory Visit | Attending: Radiation Oncology | Admitting: Radiation Oncology

## 2022-04-03 ENCOUNTER — Inpatient Hospital Stay: Payer: No Typology Code available for payment source | Attending: Hematology

## 2022-04-03 DIAGNOSIS — E860 Dehydration: Secondary | ICD-10-CM

## 2022-04-03 DIAGNOSIS — Z452 Encounter for adjustment and management of vascular access device: Secondary | ICD-10-CM

## 2022-04-03 DIAGNOSIS — C210 Malignant neoplasm of anus, unspecified: Secondary | ICD-10-CM | POA: Insufficient documentation

## 2022-04-03 DIAGNOSIS — Z5111 Encounter for antineoplastic chemotherapy: Secondary | ICD-10-CM | POA: Insufficient documentation

## 2022-04-03 DIAGNOSIS — R112 Nausea with vomiting, unspecified: Secondary | ICD-10-CM | POA: Insufficient documentation

## 2022-04-03 DIAGNOSIS — Z923 Personal history of irradiation: Secondary | ICD-10-CM | POA: Insufficient documentation

## 2022-04-03 DIAGNOSIS — Z920 Personal history of contraception: Secondary | ICD-10-CM | POA: Insufficient documentation

## 2022-04-03 DIAGNOSIS — D500 Iron deficiency anemia secondary to blood loss (chronic): Secondary | ICD-10-CM | POA: Insufficient documentation

## 2022-04-03 LAB — RAD ONC ARIA SESSION SUMMARY
Course Elapsed Days: 25
Plan Fractions Treated to Date: 18
Plan Prescribed Dose Per Fraction: 1.8 Gy
Plan Total Fractions Prescribed: 28
Plan Total Prescribed Dose: 50.4 Gy
Reference Point Dosage Given to Date: 36 Gy
Reference Point Session Dosage Given: 1.8 Gy
Session Number: 20

## 2022-04-03 MED ORDER — SODIUM CHLORIDE 0.9% FLUSH
10.0000 mL | Freq: Once | INTRAVENOUS | Status: AC
  Administered 2022-04-03: 10 mL

## 2022-04-03 MED ORDER — HEPARIN SOD (PORK) LOCK FLUSH 100 UNIT/ML IV SOLN
500.0000 [IU] | Freq: Once | INTRAVENOUS | Status: AC
  Administered 2022-04-03: 500 [IU]

## 2022-04-07 ENCOUNTER — Inpatient Hospital Stay: Payer: No Typology Code available for payment source

## 2022-04-07 ENCOUNTER — Other Ambulatory Visit: Payer: Self-pay

## 2022-04-07 ENCOUNTER — Encounter (HOSPITAL_COMMUNITY): Payer: Self-pay

## 2022-04-07 ENCOUNTER — Other Ambulatory Visit (HOSPITAL_COMMUNITY): Payer: Self-pay

## 2022-04-07 ENCOUNTER — Encounter: Payer: Self-pay | Admitting: Hematology

## 2022-04-07 ENCOUNTER — Inpatient Hospital Stay (HOSPITAL_BASED_OUTPATIENT_CLINIC_OR_DEPARTMENT_OTHER): Payer: Self-pay | Admitting: Hematology

## 2022-04-07 ENCOUNTER — Ambulatory Visit
Admission: RE | Admit: 2022-04-07 | Discharge: 2022-04-07 | Disposition: A | Discharge status: Home / Self Care | Payer: No Typology Code available for payment source | Source: Ambulatory Visit | Attending: Radiation Oncology | Admitting: Radiation Oncology

## 2022-04-07 ENCOUNTER — Inpatient Hospital Stay (HOSPITAL_COMMUNITY): Admission: RE | Admit: 2022-04-07 | Payer: No Typology Code available for payment source | Source: Ambulatory Visit

## 2022-04-07 ENCOUNTER — Other Ambulatory Visit: Payer: Self-pay | Admitting: Hematology

## 2022-04-07 VITALS — HR 100

## 2022-04-07 VITALS — BP 117/93 | HR 114 | Temp 98.6°F | Resp 18 | Ht 72.0 in | Wt 269.6 lb

## 2022-04-07 DIAGNOSIS — C210 Malignant neoplasm of anus, unspecified: Secondary | ICD-10-CM

## 2022-04-07 DIAGNOSIS — E860 Dehydration: Secondary | ICD-10-CM

## 2022-04-07 DIAGNOSIS — D500 Iron deficiency anemia secondary to blood loss (chronic): Secondary | ICD-10-CM

## 2022-04-07 DIAGNOSIS — R112 Nausea with vomiting, unspecified: Secondary | ICD-10-CM

## 2022-04-07 DIAGNOSIS — Z452 Encounter for adjustment and management of vascular access device: Secondary | ICD-10-CM

## 2022-04-07 LAB — RAD ONC ARIA SESSION SUMMARY
Course Elapsed Days: 29
Plan Fractions Treated to Date: 19
Plan Prescribed Dose Per Fraction: 1.8 Gy
Plan Total Fractions Prescribed: 28
Plan Total Prescribed Dose: 50.4 Gy
Reference Point Dosage Given to Date: 37.8 Gy
Reference Point Session Dosage Given: 1.8 Gy
Session Number: 21

## 2022-04-07 LAB — CBC WITH DIFFERENTIAL/PLATELET
Abs Immature Granulocytes: 0.05 10*3/uL (ref 0.00–0.07)
Basophils Absolute: 0 10*3/uL (ref 0.0–0.1)
Basophils Relative: 0 %
Eosinophils Absolute: 0.1 10*3/uL (ref 0.0–0.5)
Eosinophils Relative: 1 %
HCT: 28.8 % — ABNORMAL LOW (ref 36.0–46.0)
Hemoglobin: 9.3 g/dL — ABNORMAL LOW (ref 12.0–15.0)
Immature Granulocytes: 1 %
Lymphocytes Relative: 3 %
Lymphs Abs: 0.3 10*3/uL — ABNORMAL LOW (ref 0.7–4.0)
MCH: 25.3 pg — ABNORMAL LOW (ref 26.0–34.0)
MCHC: 32.3 g/dL (ref 30.0–36.0)
MCV: 78.5 fL — ABNORMAL LOW (ref 80.0–100.0)
Monocytes Absolute: 0.6 10*3/uL (ref 0.1–1.0)
Monocytes Relative: 7 %
Neutro Abs: 7.5 10*3/uL (ref 1.7–7.7)
Neutrophils Relative %: 88 %
Platelets: 84 10*3/uL — ABNORMAL LOW (ref 150–400)
RBC: 3.67 MIL/uL — ABNORMAL LOW (ref 3.87–5.11)
RDW: 35.3 % — ABNORMAL HIGH (ref 11.5–15.5)
WBC: 8.5 10*3/uL (ref 4.0–10.5)
nRBC: 0 % (ref 0.0–0.2)

## 2022-04-07 LAB — COMPREHENSIVE METABOLIC PANEL
ALT: 45 U/L — ABNORMAL HIGH (ref 0–44)
AST: 14 U/L — ABNORMAL LOW (ref 15–41)
Albumin: 3.7 g/dL (ref 3.5–5.0)
Alkaline Phosphatase: 69 U/L (ref 38–126)
Anion gap: 7 (ref 5–15)
BUN: 15 mg/dL (ref 6–20)
CO2: 26 mmol/L (ref 22–32)
Calcium: 9.3 mg/dL (ref 8.9–10.3)
Chloride: 102 mmol/L (ref 98–111)
Creatinine, Ser: 0.62 mg/dL (ref 0.44–1.00)
GFR, Estimated: >60 mL/min (ref >60)
Glucose, Bld: 118 mg/dL — ABNORMAL HIGH (ref 70–99)
Potassium: 4 mmol/L (ref 3.5–5.1)
Sodium: 135 mmol/L (ref 135–145)
Total Bilirubin: 0.4 mg/dL (ref 0.3–1.2)
Total Protein: 7.7 g/dL (ref 6.5–8.1)

## 2022-04-07 MED ORDER — SODIUM CHLORIDE 0.9 % IV SOLN: Freq: Once | INTRAVENOUS | Status: AC

## 2022-04-07 MED ORDER — ACETAMINOPHEN-CODEINE 300-30 MG PO TABS
1.0000 | ORAL_TABLET | Freq: Four times a day (QID) | ORAL | 0 refills | Status: DC | PRN
  Filled 2022-04-07: qty 30, 8d supply, fill #0

## 2022-04-07 MED ORDER — MITOMYCIN CHEMO IV INJECTION 20 MG
7.5000 mg/m2 | Freq: Once | INTRAVENOUS | Status: AC
  Administered 2022-04-07: 18.5 mg via INTRAVENOUS
  Filled 2022-04-07: qty 37

## 2022-04-07 MED ORDER — SODIUM CHLORIDE 0.9% FLUSH
10.0000 mL | Freq: Once | INTRAVENOUS | Status: AC
  Administered 2022-04-07: 10 mL

## 2022-04-07 MED ORDER — PROCHLORPERAZINE MALEATE 10 MG PO TABS
10.0000 mg | ORAL_TABLET | Freq: Once | ORAL | Status: AC
  Administered 2022-04-07: 10 mg via ORAL
  Filled 2022-04-07: qty 1

## 2022-04-07 MED ORDER — SODIUM CHLORIDE 0.9 % IV SOLN
1000.0000 mg/m2/d | INTRAVENOUS | Status: DC
  Administered 2022-04-07: 9800 mg via INTRAVENOUS
  Filled 2022-04-07: qty 196

## 2022-04-07 NOTE — Patient Instructions (Signed)
[facility]  Discharge Instructions: Thank you for choosing [facility] to provide your oncology and hematology care.   If you have a lab appointment with the [facility], please go directly to the [facility] and check in at the registration area.   Wear comfortable clothing and clothing appropriate for easy access to any Portacath or PICC line.   We strive to give you quality time with your provider. You may need to reschedule your appointment if you arrive late (15 or more minutes).  Arriving late affects you and other patients whose appointments are after yours.  Also, if you miss three or more appointments without notifying the office, you may be dismissed from the clinic at the provider's discretion.      For prescription refill requests, have your pharmacy contact our office and allow 72 hours for refills to be completed.    Today you received the following chemotherapy and/or immunotherapy agents: Mitomycin and fluorouracil      To help prevent nausea and vomiting after your treatment, we encourage you to take your nausea medication as directed.  BELOW ARE SYMPTOMS THAT SHOULD BE REPORTED IMMEDIATELY: *FEVER GREATER THAN 100.4 F (38 C) OR HIGHER *CHILLS OR SWEATING *NAUSEA AND VOMITING THAT IS NOT CONTROLLED WITH YOUR NAUSEA MEDICATION *UNUSUAL SHORTNESS OF BREATH *UNUSUAL BRUISING OR BLEEDING *URINARY PROBLEMS (pain or burning when urinating, or frequent urination) *BOWEL PROBLEMS (unusual diarrhea, constipation, pain near the anus) TENDERNESS IN MOUTH AND THROAT WITH OR WITHOUT PRESENCE OF ULCERS (sore throat, sores in mouth, or a toothache) UNUSUAL RASH, SWELLING OR PAIN  UNUSUAL VAGINAL DISCHARGE OR ITCHING   Items with * indicate a potential emergency and should be followed up as soon as possible or go to the Emergency Department if any problems should occur.  Please show the CHEMOTHERAPY ALERT CARD or IMMUNOTHERAPY ALERT  CARD at check-in to the Emergency Department and triage nurse.  Should you have questions after your visit or need to cancel or reschedule your appointment, please contact [facility]  Dept: [phone]  and follow the prompts.  Office hours are 8:00 a.m. to 4:30 p.m. Monday - Friday. Please note that voicemails left after 4:00 p.m. may not be returned until the following business day.  We are closed weekends and major holidays. You have access to a nurse at all times for urgent questions. Please call the main number to the clinic Dept: [phone] and follow the prompts.   For any non-urgent questions, you may also contact your provider using MyChart. We now offer e-Visits for anyone 75 and older to request care online for non-urgent symptoms. For details visit [email or web address].   Also download the MyChart app! Go to the app store, search "MyChart", open the app, select [facility], and log in with your MyChart username and password.  Masks are optional in the cancer centers. If you would like for your care team to wear a mask while they are taking care of you, please let them know. You may have one support person who is at least 29 years old accompany you for your appointments. Mitomycin Injection What is this medication? MITOMYCIN (mye toe MYE sin) treats stomach cancer and pancreatic cancer. It works by slowing down the growth of cancer cells. This medicine may be used for other purposes; ask your health care provider or pharmacist if you have questions. COMMON BRAND NAME(S): Mutamycin What should I tell my care team before I take  this medication? They need to know if you have any of these conditions: Bleeding disorders Infection, such as chickenpox, cold sores, herpes Low blood counts, such as low white cells, platelets, red blood cells Kidney disease An unusual or allergic reaction to mitomycin, other medications, foods, dyes, or  preservatives Pregnant or trying to get pregnant Breastfeeding How should I use this medication? This medication is injected into a vein. It is given by your care team in a hospital or clinic setting. Talk to your care team about the use of this medication in children. Special care may be needed. Overdosage: If you think you have taken too much of this medicine contact a poison control center or emergency room at once. NOTE: This medicine is only for you. Do not share this medicine with others. What if I miss a dose? Keep appointments for follow-up doses. It is important not to miss your dose. Call your care team if you are unable to keep an appointment. What may interact with this medication? Interactions are not expected. This list may not describe all possible interactions. Give your health care provider a list of all the medicines, herbs, non-prescription drugs, or dietary supplements you use. Also tell them if you smoke, drink alcohol, or use illegal drugs. Some items may interact with your medicine. What should I watch for while using this medication? Your condition will be monitored carefully while you are receiving this medication. You may need blood work while taking this medication. This medication may make you feel generally unwell. This is not uncommon as chemotherapy can affect healthy cells as well as cancer cells. Report any side effects. Continue your course of treatment even though you feel ill unless your care team tells you to stop. This medication may increase your risk of getting an infection. Call your care team for advice if you get a fever, chills, sore throat, or other symptoms of a cold or flu. Do not treat yourself. Try to avoid being around people who are sick. Avoid taking medications that contain aspirin, acetaminophen, ibuprofen, naproxen, or ketoprofen unless instructed by your care team. These medications may hide a fever. This medication may increase your risk to  bruise or bleed. Call your care team if you notice any unusual bleeding. Be careful brushing or flossing your teeth or using a toothpick because you may get an infection or bleed more easily. If you have any dental work done, tell your dentist you are receiving this medication. Talk to your care team if you may be pregnant. Serious birth defects can occur if you take this medication during pregnancy. Contraception is recommended while taking this medication. Your care team can help you find the option that works for you. Do not breastfeed while taking this medication. What side effects may I notice from receiving this medication? Side effects that you should report to your care team as soon as possible: Allergic reactions--skin rash, itching, hives, swelling of the face, lips, tongue, or throat Dry cough, shortness of breath or trouble breathing Infection--fever, chills, cough, sore throat, wounds that don't heal, pain or trouble when passing urine, general feeling of discomfort or being unwell Kidney injury--decrease in the amount of urine, swelling of the ankles, hands, or feet Low red blood cell level--unusual weakness or fatigue, dizziness, headache, trouble breathing Stomach pain, bloody diarrhea, pale skin, unusual weakness or fatigue, decrease in the amount of urine, which may be signs of hemolytic uremic syndrome Unusual bruising or bleeding Side effects that usually do not  require medical attention (report these to your care team if they continue or are bothersome): Diarrhea Hair loss Loss of appetite with weight loss Nausea Pain, redness, or swelling with sores inside the mouth or throat This list may not describe all possible side effects. Call your doctor for medical advice about side effects. You may report side effects to FDA at 1-800-FDA-1088. Where should I keep my medication? This medication is given in a hospital or clinic. It will not be stored at home. NOTE: This sheet is a  summary. It may not cover all possible information. If you have questions about this medicine, talk to your doctor, pharmacist, or health care provider.  2023 Elsevier/Gold Standard (2021-12-10 00:00:00) Fluorouracil Injection What is this medication? FLUOROURACIL (flure oh YOOR a sil) treats some types of cancer. It works by slowing down the growth of cancer cells. This medicine may be used for other purposes; ask your health care provider or pharmacist if you have questions. COMMON BRAND NAME(S): Adrucil What should I tell my care team before I take this medication? They need to know if you have any of these conditions: Blood disorders Dihydropyrimidine dehydrogenase (DPD) deficiency Infection, such as chickenpox, cold sores, herpes Kidney disease Liver disease Poor nutrition Recent or ongoing radiation therapy An unusual or allergic reaction to fluorouracil, other medications, foods, dyes, or preservatives If you or your partner are pregnant or trying to get pregnant Breast-feeding How should I use this medication? This medication is injected into a vein. It is administered by your care team in a hospital or clinic setting. Talk to your care team about the use of this medication in children. Special care may be needed. Overdosage: If you think you have taken too much of this medicine contact a poison control center or emergency room at once. NOTE: This medicine is only for you. Do not share this medicine with others. What if I miss a dose? Keep appointments for follow-up doses. It is important not to miss your dose. Call your care team if you are unable to keep an appointment. What may interact with this medication? Do not take this medication with any of the following: Live virus vaccines This medication may also interact with the following: Medications that treat or prevent blood clots, such as warfarin, enoxaparin, dalteparin This list may not describe all possible interactions.  Give your health care provider a list of all the medicines, herbs, non-prescription drugs, or dietary supplements you use. Also tell them if you smoke, drink alcohol, or use illegal drugs. Some items may interact with your medicine. What should I watch for while using this medication? Your condition will be monitored carefully while you are receiving this medication. This medication may make you feel generally unwell. This is not uncommon as chemotherapy can affect healthy cells as well as cancer cells. Report any side effects. Continue your course of treatment even though you feel ill unless your care team tells you to stop. In some cases, you may be given additional medications to help with side effects. Follow all directions for their use. This medication may increase your risk of getting an infection. Call your care team for advice if you get a fever, chills, sore throat, or other symptoms of a cold or flu. Do not treat yourself. Try to avoid being around people who are sick. This medication may increase your risk to bruise or bleed. Call your care team if you notice any unusual bleeding. Be careful brushing or flossing your teeth or  using a toothpick because you may get an infection or bleed more easily. If you have any dental work done, tell your dentist you are receiving this medication. Avoid taking medications that contain aspirin, acetaminophen, ibuprofen, naproxen, or ketoprofen unless instructed by your care team. These medications may hide a fever. Do not treat diarrhea with over the counter products. Contact your care team if you have diarrhea that lasts more than 2 days or if it is severe and watery. This medication can make you more sensitive to the sun. Keep out of the sun. If you cannot avoid being in the sun, wear protective clothing and sunscreen. Do not use sun lamps, tanning beds, or tanning booths. Talk to your care team if you or your partner wish to become pregnant or think you  might be pregnant. This medication can cause serious birth defects if taken during pregnancy and for 3 months after the last dose. A reliable form of contraception is recommended while taking this medication and for 3 months after the last dose. Talk to your care team about effective forms of contraception. Do not father a child while taking this medication and for 3 months after the last dose. Use a condom while having sex during this time period. Do not breastfeed while taking this medication. This medication may cause infertility. Talk to your care team if you are concerned about your fertility. What side effects may I notice from receiving this medication? Side effects that you should report to your care team as soon as possible: Allergic reactions--skin rash, itching, hives, swelling of the face, lips, tongue, or throat Heart attack--pain or tightness in the chest, shoulders, arms, or jaw, nausea, shortness of breath, cold or clammy skin, feeling faint or lightheaded Heart failure--shortness of breath, swelling of the ankles, feet, or hands, sudden weight gain, unusual weakness or fatigue Heart rhythm changes--fast or irregular heartbeat, dizziness, feeling faint or lightheaded, chest pain, trouble breathing High ammonia level--unusual weakness or fatigue, confusion, loss of appetite, nausea, vomiting, seizures Infection--fever, chills, cough, sore throat, wounds that don't heal, pain or trouble when passing urine, general feeling of discomfort or being unwell Low red blood cell level--unusual weakness or fatigue, dizziness, headache, trouble breathing Pain, tingling, or numbness in the hands or feet, muscle weakness, change in vision, confusion or trouble speaking, loss of balance or coordination, trouble walking, seizures Redness, swelling, and blistering of the skin over hands and feet Severe or prolonged diarrhea Unusual bruising or bleeding Side effects that usually do not require medical  attention (report to your care team if they continue or are bothersome): Dry skin Headache Increased tears Nausea Pain, redness, or swelling with sores inside the mouth or throat Sensitivity to light Vomiting This list may not describe all possible side effects. Call your doctor for medical advice about side effects. You may report side effects to FDA at 1-800-FDA-1088. Where should I keep my medication? This medication is given in a hospital or clinic. It will not be stored at home. NOTE: This sheet is a summary. It may not cover all possible information. If you have questions about this medicine, talk to your doctor, pharmacist, or health care provider.  2023 Elsevier/Gold Standard (2021-11-25 00:00:00)  Blood Transfusion, Adult, Care After The following information offers guidance on how to care for yourself after your procedure. Your health care provider may also give you more specific instructions. If you have problems or questions, contact your health care provider. What can I expect after the procedure? After  the procedure, it is common to have: Bruising and soreness where the IV was inserted. A headache. Follow these instructions at home: IV insertion site care     Follow instructions from your health care provider about how to take care of your IV insertion site. Make sure you: Wash your hands with soap and water for at least 20 seconds before and after you change your bandage (dressing). If soap and water are not available, use hand sanitizer. Change your dressing as told by your health care provider. Check your IV insertion site every day for signs of infection. Check for: Redness, swelling, or pain. Bleeding from the site. Warmth. Pus or a bad smell. General instructions Take over-the-counter and prescription medicines only as told by your health care provider. Rest as told by your health care provider. Return to your normal activities as told by your health care  provider. Keep all follow-up visits. Lab tests may need to be done at certain periods to recheck your blood counts. Contact a health care provider if: You have itching or red, swollen areas of skin (hives). You have a fever or chills. You have pain in the head, back, or chest. You feel anxious or you feel weak after doing your normal activities. You have redness, swelling, warmth, or pain around the IV insertion site. You have blood coming from the IV insertion site that does not stop with pressure. You have pus or a bad smell coming from your IV insertion site. If you received your blood transfusion in an outpatient setting, you will be told whom to contact to report any reactions. Get help right away if: You have symptoms of a serious allergic or immune system reaction, including: Trouble breathing or shortness of breath. Swelling of the face, feeling flushed, or widespread rash. Dark urine or blood in the urine. Fast heartbeat. These symptoms may be an emergency. Get help right away. Call 911. Do not wait to see if the symptoms will go away. Do not drive yourself to the hospital. Summary Bruising and soreness around the IV insertion site are common. Check your IV insertion site every day for signs of infection. Rest as told by your health care provider. Return to your normal activities as told by your health care provider. Get help right away for symptoms of a serious allergic or immune system reaction to the blood transfusion. This information is not intended to replace advice given to you by your health care provider. Make sure you discuss any questions you have with your health care provider. Document Revised: 10/17/2021 Document Reviewed: 10/17/2021 Elsevier Patient Education  [place].

## 2022-04-07 NOTE — Progress Notes (Addendum)
[place]   Telephone:(336) [phone] [phone]   Clinic Follow up Note   Patient Care Team: Pcp, No as PCP - General [name], MD (Rheumatology) Royston Sinner Colin Benton, MD as Consulting Physician (Obstetrics and Gynecology) Truitt Merle, MD as Consulting Physician (Hematology and Oncology)  Date of Service:  04/07/2022  CHIEF COMPLAINT: f/u of anal cancer  CURRENT THERAPY:  Neoadjuvant concurrent chemoRT, starting 03/09/22             -mitomycin on day 1, 28             -5FU on days 1-5, 28-31  ASSESSMENT & PLAN:  Tammy Garrison is a 31 y.o. female with   1. Anal Cancer, cT2N0Mx, with hypermetabolic left axillary nodes, HPV(+) -presented with worsening rectal bleeding, began passing clots and was admitted 02/06/22 for anemia from blood loss. S/p hemorrhoidectomy on 02/08/22 by Dr. 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 -PET scan 02/26/22 showed: intense uptake to anus; no signs of solid organ or FDG-avid nodal metastasis within abdomen or pelvis, hypermetabolic left axillary lymph nodes.  Sequently left axillary lymph node biopsy was benign. -she established care with Dr. Dema Severin on 03/03/22. -she began concurrent chemoRT with mitomycin/5FU on 03/09/22. She developed severe nausea with vomiting, as well as headaches. She was given IVF on three days last week and recovered well. -she is keeping her PICC line in place and has it flushed daily when she comes in for radiation. -labs reviewed, hgb 9.3, plt 84k today. Will proceed with 5FU/mitomycin as scheduled. -Her nadir platelet count was 79 K after first dose chemo, recovered completely.  Her mild thrombocytopenia today is probably related to severe vaginal bleeding   2. ChemoRT Toxicities: Rectal irritation -she has developed irritation from radiation. She is alternating norco and tylenol #3.   3.  Anemia, from bleeding and iron deficiency -low hgb dating back to 04/2018, prior to that in 2017 was WNL. She has heavy periods, requiring pad/tampon change every 30 minutes. -she has required IV Feraheme and pRBC. -she has been on her period for the last 12 days. She was started on Megace on 04/02/22, which is causing diarrhea and insomnia.     PLAN:  -proceed with 5FU/mitomycin today, will reduce mitomycin dose by 25% due to her low nadir platelet count after first does  -continue daily radiation -lab, f/u, and f/u on 04/13/22 -I sent an urgent message to GYN Dr. Rip Harbour to see if we can see her this or  next week.   No problem-specific Assessment & Plan notes found for this encounter.   SUMMARY OF ONCOLOGIC HISTORY: Oncology History  Anal cancer ([place])  02/08/2022 Pathology Results   FINAL MICROSCOPIC DIAGNOSIS:   A. SOFT TISSUE, ANTERIOR MASS VS. HEMORRHOID, EXCISION:  Invasive squamous cell carcinoma, focally keratinizing and  well-differentiated.  Carcinoma is present at the margins of excision.   B. HEMORRHOID, INTERNAL, EXCISION:  Hemorrhoid without the overlying epithelium.  Separate fragment of benign squamous epithelium.  Negative for neoplasm.   ADDENDUM:  P16 immunostain is strongly positive suggestive of this lesion being the HPV related.    02/08/2022 Cancer Staging   Staging form: Anus, AJCC V9 - Clinical stage from 02/08/2022: Stage IIA (cT2, cN0, cM0) - Signed by Truitt Merle, MD on 02/23/2022 Stage prefix: Initial diagnosis   02/23/2022 Initial Diagnosis   Anal cancer ([place])   03/09/2022 -  03/13/2022 Chemotherapy   Patient is on Treatment Plan : ANUS Mitomycin D1,28 / 5FU D1-4, 28-31 q32d     03/09/2022 -  Chemotherapy   Patient is on Treatment Plan : ANUS Mitomycin D1,28 + 5FU D1-4, 28-31 q32d        INTERVAL HISTORY:  Tammy Garrison is here for a follow up of anal cancer. She was last seen by me on 03/30/22. She presents to the clinic alone. She is having  a lot of pelvic concerns-- rectal irritation from radiation, diarrhea from the megace, vaginal bleeding with clots from fibroids. She notes she has been bleeding for the last 12 days. She is visibly in pain today and having issues sitting straight on her bottom. She reports she had chills and low appetite this past weekend. She notes her temperature was low at 22F.   All other systems were reviewed with the patient and are negative.  MEDICAL HISTORY:  Past Medical History:  Diagnosis Date   Bipolar 1 disorder ([place])    ETOH abuse    H/O self-harm    H/O suicide attempt    x 5 - last 05/2016 - overdose Ibuprofen   Marijuana abuse    Rheumatoid arthritis ([place])     SURGICAL HISTORY: Past Surgical History:  Procedure Laterality Date   HEMORRHOID SURGERY N/A 02/08/2022   Procedure: EXTERNAL AND INTERNAL HEMORRHOIDECTOMY;  Surgeon: Georganna Skeans, MD;  Location: [facility];  Service: General;  Laterality: N/A;    I have reviewed the social history and family history with the patient and they are unchanged from previous note.  ALLERGIES:  is allergic to coconut flavor.  MEDICATIONS:  Current Outpatient Medications  Medication Sig Dispense Refill   acetaminophen (TYLENOL) 500 MG tablet Take 2 tablets (1,000 mg total) by mouth every 6 (six) hours as needed. (Patient not taking: Reported on 03/05/2022) 100 tablet 2   acetaminophen-codeine (TYLENOL #3) 300-30 MG tablet Take 1 tablet by mouth every 6 hours as needed for moderate pain. 30 tablet 0   Ascorbic Acid (VITAMIN C PO) Take 1 tablet by mouth daily. (Patient not taking: Reported on 03/05/2022)     dexamethasone (DECADRON) 4 MG tablet Take 1 tablet (4 mg total) by mouth daily. Take daily for up to 5 days after chemo. 30 tablet 0   docusate sodium (COLACE) 100 MG capsule Take 1 capsule (100 mg total) by mouth 2 (two) times daily. (Patient not taking: Reported on 03/05/2022) 10 capsule 0   HYDROcodone-acetaminophen (NORCO/VICODIN) 5-325 MG tablet Take  1 tablet by mouth every 6 (six) hours as needed for moderate pain. 30 tablet 0   megestrol (MEGACE) 40 MG tablet Take 1 tablet (40 mg total) by mouth 2 (two) times daily. 28 tablet 0   Multiple Vitamins-Minerals (HAIR/SKIN/NAILS) CAPS Take 1 capsule by mouth daily. (Patient not taking: Reported on 03/05/2022)     ondansetron (ZOFRAN-ODT) 8 MG disintegrating tablet Dissolve 1 tablet (8 mg total) by mouth every 8 (eight) hours as needed for nausea or vomiting. 20 tablet 1   polyethylene glycol (MIRALAX / GLYCOLAX) 17 g packet Take 17 g by mouth 2 (two) times daily. (Patient not taking: Reported on 03/05/2022) 14 each 0   promethazine (PHENERGAN) 25 MG tablet Take 1 tablet (25 mg total) by mouth every 6 (six) hours as needed for nausea or vomiting. 30 tablet 1   trolamine salicylate (ASPERCREME) 10 % cream Apply 1 application topically as needed for muscle pain. (Patient not taking: Reported on 03/05/2022)  VITAMIN D PO Take 1 capsule by mouth daily. (Patient not taking: Reported on 03/05/2022)     No current facility-administered medications for this visit.   Facility-Administered Medications Ordered in Other Visits  Medication Dose Route Frequency Provider Last Rate Last Admin   0.9 %  sodium chloride infusion (Manually program via Guardrails IV Fluids)  250 mL Intravenous Once Truitt Merle, MD       fluorouracil (ADRUCIL) 9,800 mg in sodium chloride 0.9 % 54 mL chemo infusion  1,000 mg/m2/day (Treatment Plan Recorded) Intravenous 4 days Truitt Merle, MD   Infusion Verify at 04/07/22 1206   sodium chloride flush (NS) 0.9 % injection 10 mL  10 mL Intracatheter Once Truitt Merle, MD        PHYSICAL EXAMINATION: ECOG PERFORMANCE STATUS: 2 - Symptomatic, <50% confined to bed  Vitals:   04/07/22 0942  BP: (!) 117/93  Pulse: (!) 114  Resp: 18  Temp: 98.6 F (37 C)  SpO2: 100%   Wt Readings from Last 3 Encounters:  04/07/22 269 lb 9.6 oz (122.3 kg)  03/30/22 275 lb 3.2 oz (124.8 kg)  03/24/22 272 lb 6.4  oz (123.6 kg)     GENERAL:alert, no distress and (+) uncomfortable SKIN: skin color normal, no rashes or significant lesions EYES: normal, Conjunctiva are pink and non-injected, sclera clear  NEURO: alert & oriented x 3 with fluent speech  LABORATORY DATA:  I have reviewed the data as listed    Latest Ref Rng & Units 04/07/2022    9:16 AM 04/02/2022    9:26 AM 03/31/2022   11:06 PM  CBC  WBC 4.0 - 10.5 K/uL 8.5  17.2  13.9   Hemoglobin 12.0 - 15.0 g/dL 9.3  10.1  9.8   Hematocrit 36.0 - 46.0 % 28.8  32.1  32.6   Platelets 150 - 400 K/uL 84  163  153         Latest Ref Rng & Units 04/07/2022    9:16 AM 04/02/2022    9:26 AM 03/31/2022    7:52 PM  CMP  Glucose 70 - 99 mg/dL 118  137  101   BUN 6 - 20 mg/dL '15  15  11   '$ Creatinine 0.44 - 1.00 mg/dL 0.62  0.71  0.60   Sodium 135 - 145 mmol/L 135  136  133   Potassium 3.5 - 5.1 mmol/L 4.0  4.2  3.9   Chloride 98 - 111 mmol/L 102  104  99   CO2 22 - 32 mmol/L 26  28    Calcium 8.9 - 10.3 mg/dL 9.3  9.6    Total Protein 6.5 - 8.1 g/dL 7.7  7.7    Total Bilirubin 0.3 - 1.2 mg/dL 0.4  0.3    Alkaline Phos 38 - 126 U/L 69  65    AST 15 - 41 U/L 14  45    ALT 0 - 44 U/L 45  99        RADIOGRAPHIC STUDIES: I have personally reviewed the radiological images as listed and agreed with the findings in the report. No results found.    No orders of the defined types were placed in this encounter.  All questions were answered. The patient knows to call the clinic with any problems, questions or concerns. No barriers to learning was detected. The total time spent in the appointment was 30 minutes.     Truitt Merle, MD 04/07/2022   I, Wilburn Mylar, am acting as  scribe for Truitt Merle, MD.   I have reviewed the above documentation for accuracy and completeness, and I agree with the above.

## 2022-04-08 ENCOUNTER — Other Ambulatory Visit: Payer: Self-pay

## 2022-04-08 ENCOUNTER — Inpatient Hospital Stay: Payer: No Typology Code available for payment source

## 2022-04-08 ENCOUNTER — Ambulatory Visit
Admission: RE | Admit: 2022-04-08 | Discharge: 2022-04-08 | Disposition: A | Discharge status: Home / Self Care | Payer: No Typology Code available for payment source | Source: Ambulatory Visit | Attending: Radiation Oncology | Admitting: Radiation Oncology

## 2022-04-08 LAB — RAD ONC ARIA SESSION SUMMARY
Course Elapsed Days: 30
Plan Fractions Treated to Date: 20
Plan Prescribed Dose Per Fraction: 1.8 Gy
Plan Total Fractions Prescribed: 28
Plan Total Prescribed Dose: 50.4 Gy
Reference Point Dosage Given to Date: 39.6 Gy
Reference Point Session Dosage Given: 1.8 Gy
Session Number: 22

## 2022-04-09 ENCOUNTER — Encounter: Payer: No Typology Code available for payment source | Admitting: Physical Therapy

## 2022-04-09 ENCOUNTER — Inpatient Hospital Stay: Payer: No Typology Code available for payment source

## 2022-04-09 ENCOUNTER — Other Ambulatory Visit: Payer: Self-pay

## 2022-04-09 ENCOUNTER — Ambulatory Visit
Admission: RE | Admit: 2022-04-09 | Discharge: 2022-04-09 | Disposition: A | Discharge status: Home / Self Care | Payer: No Typology Code available for payment source | Source: Ambulatory Visit | Attending: Radiation Oncology | Admitting: Radiation Oncology

## 2022-04-09 ENCOUNTER — Other Ambulatory Visit (HOSPITAL_COMMUNITY): Payer: Medicaid Other

## 2022-04-09 LAB — RAD ONC ARIA SESSION SUMMARY
Course Elapsed Days: 31
Plan Fractions Treated to Date: 21
Plan Prescribed Dose Per Fraction: 1.8 Gy
Plan Total Fractions Prescribed: 28
Plan Total Prescribed Dose: 50.4 Gy
Reference Point Dosage Given to Date: 41.4 Gy
Reference Point Session Dosage Given: 1.8 Gy
Session Number: 23

## 2022-04-10 ENCOUNTER — Encounter: Payer: Self-pay | Admitting: Hematology

## 2022-04-10 ENCOUNTER — Telehealth: Payer: Self-pay

## 2022-04-10 ENCOUNTER — Other Ambulatory Visit: Payer: No Typology Code available for payment source

## 2022-04-10 ENCOUNTER — Other Ambulatory Visit: Payer: Self-pay

## 2022-04-10 ENCOUNTER — Ambulatory Visit
Admission: RE | Admit: 2022-04-10 | Discharge: 2022-04-10 | Disposition: A | Discharge status: Home / Self Care | Payer: No Typology Code available for payment source | Source: Ambulatory Visit | Attending: Radiation Oncology | Admitting: Radiation Oncology

## 2022-04-10 ENCOUNTER — Other Ambulatory Visit: Payer: Self-pay | Admitting: Obstetrics and Gynecology

## 2022-04-10 ENCOUNTER — Other Ambulatory Visit (HOSPITAL_COMMUNITY): Payer: Self-pay

## 2022-04-10 ENCOUNTER — Inpatient Hospital Stay (HOSPITAL_BASED_OUTPATIENT_CLINIC_OR_DEPARTMENT_OTHER): Payer: No Typology Code available for payment source | Admitting: Hematology

## 2022-04-10 ENCOUNTER — Inpatient Hospital Stay: Payer: No Typology Code available for payment source

## 2022-04-10 VITALS — BP 130/79 | HR 127 | Temp 98.7°F | Resp 17 | Wt 267.8 lb

## 2022-04-10 VITALS — BP 110/68 | HR 108 | Resp 16

## 2022-04-10 DIAGNOSIS — N938 Other specified abnormal uterine and vaginal bleeding: Secondary | ICD-10-CM

## 2022-04-10 DIAGNOSIS — C210 Malignant neoplasm of anus, unspecified: Secondary | ICD-10-CM

## 2022-04-10 DIAGNOSIS — D500 Iron deficiency anemia secondary to blood loss (chronic): Secondary | ICD-10-CM

## 2022-04-10 LAB — CBC WITH DIFFERENTIAL/PLATELET
Abs Immature Granulocytes: 0.02 10*3/uL (ref 0.00–0.07)
Basophils Absolute: 0 10*3/uL (ref 0.0–0.1)
Basophils Relative: 0 %
Eosinophils Absolute: 0.1 10*3/uL (ref 0.0–0.5)
Eosinophils Relative: 1 %
HCT: 30.7 % — ABNORMAL LOW (ref 36.0–46.0)
Hemoglobin: 10 g/dL — ABNORMAL LOW (ref 12.0–15.0)
Immature Granulocytes: 0 %
Lymphocytes Relative: 3 %
Lymphs Abs: 0.2 10*3/uL — ABNORMAL LOW (ref 0.7–4.0)
MCH: 25.9 pg — ABNORMAL LOW (ref 26.0–34.0)
MCHC: 32.6 g/dL (ref 30.0–36.0)
MCV: 79.5 fL — ABNORMAL LOW (ref 80.0–100.0)
Monocytes Absolute: 0.2 10*3/uL (ref 0.1–1.0)
Monocytes Relative: 2 %
Neutro Abs: 6.8 10*3/uL (ref 1.7–7.7)
Neutrophils Relative %: 94 %
Platelets: 56 10*3/uL — ABNORMAL LOW (ref 150–400)
RBC: 3.86 MIL/uL — ABNORMAL LOW (ref 3.87–5.11)
RDW: 35.2 % — ABNORMAL HIGH (ref 11.5–15.5)
WBC: 7.3 10*3/uL (ref 4.0–10.5)
nRBC: 0 % (ref 0.0–0.2)

## 2022-04-10 LAB — RAD ONC ARIA SESSION SUMMARY
Course Elapsed Days: 32
Plan Fractions Treated to Date: 22
Plan Prescribed Dose Per Fraction: 1.8 Gy
Plan Total Fractions Prescribed: 28
Plan Total Prescribed Dose: 50.4 Gy
Reference Point Dosage Given to Date: 43.2 Gy
Reference Point Session Dosage Given: 1.8 Gy
Session Number: 24

## 2022-04-10 LAB — COMPREHENSIVE METABOLIC PANEL
ALT: 29 U/L (ref 0–44)
AST: 13 U/L — ABNORMAL LOW (ref 15–41)
Albumin: 4.1 g/dL (ref 3.5–5.0)
Alkaline Phosphatase: 74 U/L (ref 38–126)
Anion gap: 7 (ref 5–15)
BUN: 11 mg/dL (ref 6–20)
CO2: 25 mmol/L (ref 22–32)
Calcium: 9.8 mg/dL (ref 8.9–10.3)
Chloride: 101 mmol/L (ref 98–111)
Creatinine, Ser: 0.74 mg/dL (ref 0.44–1.00)
GFR, Estimated: >60 mL/min (ref >60)
Glucose, Bld: 115 mg/dL — ABNORMAL HIGH (ref 70–99)
Potassium: 4.2 mmol/L (ref 3.5–5.1)
Sodium: 133 mmol/L — ABNORMAL LOW (ref 135–145)
Total Bilirubin: 0.4 mg/dL (ref 0.3–1.2)
Total Protein: 8.6 g/dL — ABNORMAL HIGH (ref 6.5–8.1)

## 2022-04-10 LAB — SAMPLE TO BLOOD BANK

## 2022-04-10 MED ORDER — SODIUM CHLORIDE 0.9% FLUSH: 10.0000 mL | Freq: Once | INTRAVENOUS | Status: AC

## 2022-04-10 MED ORDER — ESTROGENS CONJUGATED 1.25 MG PO TABS
1.2500 mg | ORAL_TABLET | Freq: Two times a day (BID) | ORAL | 0 refills | Status: DC
  Filled 2022-04-10: qty 14, 7d supply, fill #0

## 2022-04-10 MED ORDER — LORAZEPAM 0.5 MG PO TABS
0.5000 mg | ORAL_TABLET | Freq: Four times a day (QID) | ORAL | 0 refills | Status: DC | PRN
  Filled 2022-04-10: qty 20, 5d supply, fill #0

## 2022-04-10 MED ORDER — SODIUM CHLORIDE 0.9 % IV SOLN
10.0000 mg | Freq: Once | INTRAVENOUS | Status: AC
  Administered 2022-04-10: 10 mg via INTRAVENOUS
  Filled 2022-04-10: qty 10

## 2022-04-10 MED ORDER — SODIUM CHLORIDE 0.9 % IV SOLN
150.0000 mg | Freq: Once | INTRAVENOUS | Status: AC
  Administered 2022-04-10: 150 mg via INTRAVENOUS
  Filled 2022-04-10: qty 150

## 2022-04-10 MED ORDER — ESTROGENS CONJUGATED 1.25 MG PO TABS: 1.2500 mg | ORAL_TABLET | Freq: Two times a day (BID) | ORAL | 0 refills | Status: DC

## 2022-04-10 MED ORDER — MORPHINE SULFATE (PF) 2 MG/ML IV SOLN
2.0000 mg | Freq: Once | INTRAVENOUS | Status: AC
  Administered 2022-04-10: 2 mg via INTRAVENOUS
  Filled 2022-04-10: qty 1

## 2022-04-10 MED ORDER — HEPARIN SOD (PORK) LOCK FLUSH 100 UNIT/ML IV SOLN: 250.0000 [IU] | Freq: Once | INTRAVENOUS | Status: AC

## 2022-04-10 MED ORDER — SODIUM CHLORIDE 0.9 % IV SOLN: Freq: Once | INTRAVENOUS | Status: AC

## 2022-04-10 NOTE — Progress Notes (Signed)
PICC dressing change was due for today, however, patient is currently getting 5FU infusion. Per Dr. Burr Medico, ok to wait until tomorrow to change PICC dressing. Patient states she would like to keep her PICC line in place for future IV fluid/ IV iron infusions. Patient was originally scheduled to have PICC line removed tomorrow (04/11/2022), per Dr. Burr Medico, ok to keep PICC line in place for future infusions.

## 2022-04-10 NOTE — Progress Notes (Signed)
[name]   Telephone:(336) [phone] Fax:(336) [phone]   Clinic Follow up Note   Patient Care Team: Pcp, No as PCP - General [name], MD (Rheumatology) Royston Sinner Colin Benton, MD as Consulting Physician (Obstetrics and Gynecology) Truitt Merle, MD as Consulting Physician (Hematology and Oncology)  Date of Service:  04/10/2022  CHIEF COMPLAINT: nausea, vomiting and fatigue, insomnia   CURRENT THERAPY:  Neoadjuvant concurrent chemoRT, starting 03/09/22             -mitomycin on day 1, 28             -5FU on days 1-5, 28-31  ASSESSMENT & PLAN:  Tammy Garrison is a 31 y.o. female with   1. Anal Cancer, cT2N0Mx, with hypermetabolic left axillary nodes, HPV(+) -on concurrent chemoradiation   2.  Nausea, vomiting, poor oral intake -Secondary to chemotherapy   3. Anemia, from bleeding and iron deficiency  4.  Vaginal bleeding -She has been having vaginal bleeding for 17 days, with large blood clots    PLAN:  -My nurse checked chemo infusion pump, which is running normally. -1 L normal saline today with IV Dex and Emend, will give IV morphine 2 mg for her pain -check lab to see if she needs blood transfusion -I have reached out to gynecologist again to see if they can see her sooner -Lab follow-up scheduled for next Monday, will add on IV fluids   No problem-specific Assessment & Plan notes found for this encounter.   SUMMARY OF ONCOLOGIC HISTORY: Oncology History  Anal cancer ([place])  02/08/2022 Pathology Results   FINAL MICROSCOPIC DIAGNOSIS:   A. SOFT TISSUE, ANTERIOR MASS VS. HEMORRHOID, EXCISION:  Invasive squamous cell carcinoma, focally keratinizing and  well-differentiated.  Carcinoma is present at the margins of excision.   B. HEMORRHOID, INTERNAL, EXCISION:  Hemorrhoid without the overlying epithelium.  Separate fragment of benign squamous epithelium.  Negative for neoplasm.   ADDENDUM:  P16 immunostain is strongly positive  suggestive of this lesion being the HPV related.    02/08/2022 Cancer Staging   Staging form: Anus, AJCC V9 - Clinical stage from 02/08/2022: Stage IIA (cT2, cN0, cM0) - Signed by Truitt Merle, MD on 02/23/2022 Stage prefix: Initial diagnosis   02/23/2022 Initial Diagnosis   Anal cancer ([place])   03/09/2022 - 03/13/2022 Chemotherapy   Patient is on Treatment Plan : ANUS Mitomycin D1,28 / 5FU D1-4, 28-31 q32d     03/09/2022 -  Chemotherapy   Patient is on Treatment Plan : ANUS Mitomycin D1,28 + 5FU D1-4, 28-31 q32d        INTERVAL HISTORY:  Tammy Garrison is here for an urgent visit for worsening nausea, vomiting, rectal pain and vaginal bleeding.  She started chemo again this Monday, plan to complete tomorrow.  She has developed worsening nausea and vomiting in the past 2 to 3 days, she is still able to drink fluids clearly, but has not been eating much food.  She still has persistent severe vaginal bleeding with large blood clots, she has not heard from her gynecologist office about moving her appointments..  She also complains of worsening rectal pain, she has been taking hydrocodone 3-4 times a day, still not adequate for her pain control.  All other systems were reviewed with the patient and are negative.  MEDICAL HISTORY:  Past Medical History:  Diagnosis Date   Bipolar 1 disorder ([place])    ETOH abuse    H/O self-harm    H/O suicide  attempt    x 5 - last 05/2016 - overdose Ibuprofen   Marijuana abuse    Rheumatoid arthritis ([place])     SURGICAL HISTORY: Past Surgical History:  Procedure Laterality Date   HEMORRHOID SURGERY N/A 02/08/2022   Procedure: EXTERNAL AND INTERNAL HEMORRHOIDECTOMY;  Surgeon: Georganna Skeans, MD;  Location: [facility];  Service: General;  Laterality: N/A;    I have reviewed the social history and family history with the patient and they are unchanged from previous note.  ALLERGIES:  is allergic to coconut flavor.  MEDICATIONS:  Current Outpatient  Medications  Medication Sig Dispense Refill   LORazepam (ATIVAN) 0.5 MG tablet Take 1 tablet (0.5 mg total) by mouth every 6 (six) hours as needed (nausea). 20 tablet 0   acetaminophen (TYLENOL) 500 MG tablet Take 2 tablets (1,000 mg total) by mouth every 6 (six) hours as needed. (Patient not taking: Reported on 03/05/2022) 100 tablet 2   acetaminophen-codeine (TYLENOL #3) 300-30 MG tablet Take 1 tablet by mouth every 6 hours as needed for moderate pain. 30 tablet 0   Ascorbic Acid (VITAMIN C PO) Take 1 tablet by mouth daily. (Patient not taking: Reported on 03/05/2022)     dexamethasone (DECADRON) 4 MG tablet Take 1 tablet (4 mg total) by mouth daily. Take daily for up to 5 days after chemo. 30 tablet 0   docusate sodium (COLACE) 100 MG capsule Take 1 capsule (100 mg total) by mouth 2 (two) times daily. (Patient not taking: Reported on 03/05/2022) 10 capsule 0   HYDROcodone-acetaminophen (NORCO/VICODIN) 5-325 MG tablet Take 1 tablet by mouth every 6 (six) hours as needed for moderate pain. 30 tablet 0   megestrol (MEGACE) 40 MG tablet Take 1 tablet (40 mg total) by mouth 2 (two) times daily. 28 tablet 0   Multiple Vitamins-Minerals (HAIR/SKIN/NAILS) CAPS Take 1 capsule by mouth daily. (Patient not taking: Reported on 03/05/2022)     ondansetron (ZOFRAN-ODT) 8 MG disintegrating tablet Dissolve 1 tablet (8 mg total) by mouth every 8 (eight) hours as needed for nausea or vomiting. 20 tablet 1   polyethylene glycol (MIRALAX / GLYCOLAX) 17 g packet Take 17 g by mouth 2 (two) times daily. (Patient not taking: Reported on 03/05/2022) 14 each 0   promethazine (PHENERGAN) 25 MG tablet Take 1 tablet (25 mg total) by mouth every 6 (six) hours as needed for nausea or vomiting. 30 tablet 1   trolamine salicylate (ASPERCREME) 10 % cream Apply 1 application topically as needed for muscle pain. (Patient not taking: Reported on 03/05/2022)     VITAMIN D PO Take 1 capsule by mouth daily. (Patient not taking: Reported on 03/05/2022)      No current facility-administered medications for this visit.   Facility-Administered Medications Ordered in Other Visits  Medication Dose Route Frequency Provider Last Rate Last Admin   0.9 %  sodium chloride infusion (Manually program via Guardrails IV Fluids)  250 mL Intravenous Once Truitt Merle, MD       0.9 %  sodium chloride infusion   Intravenous Once Truitt Merle, MD       dexamethasone (DECADRON) 10 mg in sodium chloride 0.9 % 50 mL IVPB  10 mg Intravenous Once Truitt Merle, MD       fosaprepitant (EMEND) 150 mg in sodium chloride 0.9 % 145 mL IVPB  150 mg Intravenous Once Truitt Merle, MD       heparin lock flush 100 unit/mL  250 Units Intracatheter Once Truitt Merle, MD  morphine (PF) 2 MG/ML injection 2 mg  2 mg Intravenous Once Truitt Merle, MD       sodium chloride flush (NS) 0.9 % injection 10 mL  10 mL Intracatheter Once Truitt Merle, MD       sodium chloride flush (NS) 0.9 % injection 10 mL  10 mL Intracatheter Once Truitt Merle, MD        PHYSICAL EXAMINATION: ECOG PERFORMANCE STATUS: 2 - Symptomatic, <50% confined to bed  Vitals:   04/10/22 1006  BP: 130/79  Pulse: (!) 127  Resp: 17  Temp: 98.7 F (37.1 C)  SpO2: 100%   Wt Readings from Last 3 Encounters:  04/10/22 267 lb 12.8 oz (121.5 kg)  04/07/22 269 lb 9.6 oz (122.3 kg)  03/30/22 275 lb 3.2 oz (124.8 kg)     GENERAL:alert, no distress and (+) uncomfortable ORAL: No mucositis or bleeding Lung: Clear to auscultation SKIN: skin color normal, no rashes or significant lesions EYES: normal, Conjunctiva are pink and non-injected, sclera clear  NEURO: alert & oriented x 3 with fluent speech  LABORATORY DATA:  I have reviewed the data as listed    Latest Ref Rng & Units 04/07/2022    9:16 AM 04/02/2022    9:26 AM 03/31/2022   11:06 PM  CBC  WBC 4.0 - 10.5 K/uL 8.5  17.2  13.9   Hemoglobin 12.0 - 15.0 g/dL 9.3  10.1  9.8   Hematocrit 36.0 - 46.0 % 28.8  32.1  32.6   Platelets 150 - 400 K/uL 84  163  153          Latest Ref Rng & Units 04/07/2022    9:16 AM 04/02/2022    9:26 AM 03/31/2022    7:52 PM  CMP  Glucose 70 - 99 mg/dL 118  137  101   BUN 6 - 20 mg/dL '15  15  11   '$ Creatinine 0.44 - 1.00 mg/dL 0.62  0.71  0.60   Sodium 135 - 145 mmol/L 135  136  133   Potassium 3.5 - 5.1 mmol/L 4.0  4.2  3.9   Chloride 98 - 111 mmol/L 102  104  99   CO2 22 - 32 mmol/L 26  28    Calcium 8.9 - 10.3 mg/dL 9.3  9.6    Total Protein 6.5 - 8.1 g/dL 7.7  7.7    Total Bilirubin 0.3 - 1.2 mg/dL 0.4  0.3    Alkaline Phos 38 - 126 U/L 69  65    AST 15 - 41 U/L 14  45    ALT 0 - 44 U/L 45  99        RADIOGRAPHIC STUDIES: I have personally reviewed the radiological images as listed and agreed with the findings in the report. No results found.    No orders of the defined types were placed in this encounter.  All questions were answered. The patient knows to call the clinic with any problems, questions or concerns. No barriers to learning was detected. The total time spent in the appointment was 30 minutes.     Truitt Merle, MD 04/10/2022

## 2022-04-10 NOTE — Patient Instructions (Signed)
Rehydration, Adult Rehydration is the replacement of body fluids, salts, and minerals (electrolytes) that are lost during dehydration. Dehydration is when there is not enough water or other fluids in the body. This happens when you lose more fluids than you take in. Common causes of dehydration include: Not drinking enough fluids. This can occur when you are ill or doing activities that require a lot of energy, especially in hot weather. Conditions that cause loss of water or other fluids, such as diarrhea, vomiting, sweating, or urinating a lot. Other illnesses, such as fever or infection. Certain medicines, such as those that remove excess fluid from the body (diuretics). Symptoms of mild or moderate dehydration may include thirst, dry lips and mouth, and dizziness. Symptoms of severe dehydration may include increased heart rate, confusion, fainting, and not urinating. For severe dehydration, you may need to get fluids through an IV at the hospital. For mild or moderate dehydration, you can usually rehydrate at home by drinking certain fluids as told by your health care provider. What are the risks? Generally, rehydration is safe. However, taking in too much fluid (overhydration) can be a problem. This is rare. Overhydration can cause an electrolyte imbalance, kidney failure, or a decrease in salt (sodium) levels in the body. Supplies needed You will need an oral rehydration solution (ORS) if your health care provider tells you to use one. This is a drink to treat dehydration. It can be found in pharmacies and retail stores. How to rehydrate Fluids Follow instructions from your health care provider for rehydration. The kind of fluid and the amount you should drink depend on your condition. In general, you should choose drinks that you prefer. If told by your health care provider, drink an ORS. Make an ORS by following instructions on the package. Start by drinking small amounts, about  cup (120  mL) every 5-10 minutes. Slowly increase how much you drink until you have taken the amount recommended by your health care provider. Drink enough clear fluids to keep your urine pale yellow. If you were told to drink an ORS, finish it first, then start slowly drinking other clear fluids. Drink fluids such as: Water. This includes sparkling water and flavored water. Drinking only water can lead to having too little sodium in your body (hyponatremia). Follow the advice of your health care provider. Water from ice chips you suck on. Fruit juice with water you add to it (diluted). Sports drinks. Hot or cold herbal teas. Broth-based soups. Milk or milk products. Food Follow instructions from your health care provider about what to eat while you rehydrate. Your health care provider may recommend that you slowly begin eating regular foods in small amounts. Eat foods that contain a healthy balance of electrolytes, such as bananas, oranges, potatoes, tomatoes, and spinach. Avoid foods that are greasy or contain a lot of sugar. In some cases, you may get nutrition through a feeding tube that is passed through your nose and into your stomach (nasogastric tube, or NG tube). This may be done if you have uncontrolled vomiting or diarrhea. Beverages to avoid  Certain beverages may make dehydration worse. While you rehydrate, avoid drinking alcohol. How to tell if you are recovering from dehydration You may be recovering from dehydration if: You are urinating more often than before you started rehydrating. Your urine is pale yellow. Your energy level improves. You vomit less frequently. You have diarrhea less frequently. Your appetite improves or returns to normal. You feel less dizzy or less light-headed.   Your skin tone and color start to look more normal. Follow these instructions at home: Take over-the-counter and prescription medicines only as told by your health care provider. Do not take sodium  tablets. Doing this can lead to having too much sodium in your body (hypernatremia). Contact a health care provider if: You continue to have symptoms of mild or moderate dehydration, such as: Thirst. Dry lips. Slightly dry mouth. Dizziness. Dark urine or less urine than normal. Muscle cramps. You continue to vomit or have diarrhea. Get help right away if you: Have symptoms of dehydration that get worse. Have a fever. Have a severe headache. Have been vomiting and the following happens: Your vomiting gets worse or does not go away. Your vomit includes blood or green matter (bile). You cannot eat or drink without vomiting. Have problems with urination or bowel movements, such as: Diarrhea that gets worse or does not go away. Blood in your stool (feces). This may cause stool to look black and tarry. Not urinating, or urinating only a small amount of very dark urine, within 6-8 hours. Have trouble breathing. Have symptoms that get worse with treatment. These symptoms may represent a serious problem that is an emergency. Do not wait to see if the symptoms will go away. Get medical help right away. Call your local emergency services (911 in the [place] Do not drive yourself to the hospital. Summary Rehydration is the replacement of body fluids and minerals (electrolytes) that are lost during dehydration. Follow instructions from your health care provider for rehydration. The kind of fluid and amount you should drink depend on your condition. Slowly increase how much you drink until you have taken the amount recommended by your health care provider. Contact your health care provider if you continue to show signs of mild or moderate dehydration. This information is not intended to replace advice given to you by your health care provider. Make sure you discuss any questions you have with your health care provider. Document Revised: 09/20/2019 Document Reviewed: 07/31/2019 Elsevier Patient  Education  2023 [employer] Inc.  

## 2022-04-10 NOTE — Telephone Encounter (Signed)
Call received from Rip Harbour, MD stating patient needs urgent appt with GYN surgeon for DUB evaluation. Patient has multiple appts as part of oncology treatment plan on Monday next week, no GYN surgeon in office Tuesday, appt scheduled for Wednesday afternoon with Damita Dunnings, MD for appt.  Apolonio Schneiders RN

## 2022-04-11 ENCOUNTER — Inpatient Hospital Stay: Payer: No Typology Code available for payment source

## 2022-04-11 VITALS — BP 125/89 | HR 125 | Temp 98.4°F | Resp 18 | Ht 72.0 in

## 2022-04-11 DIAGNOSIS — C210 Malignant neoplasm of anus, unspecified: Secondary | ICD-10-CM

## 2022-04-11 MED ORDER — HEPARIN SOD (PORK) LOCK FLUSH 100 UNIT/ML IV SOLN
250.0000 [IU] | Freq: Once | INTRAVENOUS | Status: AC | PRN
  Administered 2022-04-11: 250 [IU]

## 2022-04-11 MED ORDER — SODIUM CHLORIDE 0.9% FLUSH
10.0000 mL | INTRAVENOUS | Status: DC | PRN
  Administered 2022-04-11: 10 mL

## 2022-04-12 NOTE — Progress Notes (Unsigned)
GYNECOLOGY OFFICE VISIT NOTE  History:   Tammy Garrison is a 31 y.o. G0 here today for DUB in the setting of fibroids and currently undergoing chemo for rectal/anal cancer.  She was placed on Megace and Premarin to initially help control her bleeding. She wasn't able to start the Premarin due to not affording it until 9/11. She is still bleeding despite the Megace. She is anemic but also has low platelets from the chemo (41 at last check).   She started bleeding August 27th and hasn't stopped. It has been having to change pad Q30-60 minutes. Wearing pad with depends and sometimes still with accident. Periods were not regular before. When she would get her cycle, periods were heavy and lasted 7 days but last 3 days were the heaviest and most painful. Similar to what she is bleeding.   She is done with the chemo now.   She had an [place] on 8/31 showing the fibroids. EL could not be appreciated.     Past Medical History:  Diagnosis Date   Bipolar 1 disorder ([place])    ETOH abuse    H/O self-harm    H/O suicide attempt    x 5 - last 05/2016 - overdose Ibuprofen   Marijuana abuse    Rheumatoid arthritis ([place])     Past Surgical History:  Procedure Laterality Date   HEMORRHOID SURGERY N/A 02/08/2022   Procedure: EXTERNAL AND INTERNAL HEMORRHOIDECTOMY;  Surgeon: Georganna Skeans, MD;  Location: [facility];  Service: General;  Laterality: N/A;    The following portions of the patient's history were reviewed and updated as appropriate: allergies, current medications, past family history, past medical history, past social history, past surgical history and problem list.   Health Maintenance:   Pap: 2020 was wnl and HPV negative  Review of Systems:  Pertinent items noted in HPI and remainder of comprehensive ROS otherwise negative.  Physical Exam:  BP 115/68   Pulse (!) 129   Wt 260 lb 8 oz (118.2 kg)   LMP 03/29/2022   BMI 35.33 kg/m  CONSTITUTIONAL: Well-developed, well-nourished  female in no acute distress.  HEENT:  Normocephalic, atraumatic. External right and left ear normal. No scleral icterus.  NECK: Normal range of motion, supple, no masses noted on observation SKIN: No rash noted. Not diaphoretic. No erythema. No pallor. MUSCULOSKELETAL: Normal range of motion. No edema noted. NEUROLOGIC: Alert and oriented to person, place, and time. Normal muscle tone coordination. No cranial nerve deficit noted. PSYCHIATRIC: Normal mood and affect. Normal behavior. Normal judgment and thought content.  CARDIOVASCULAR: Normal heart rate noted RESPIRATORY: Effort and breath sounds normal, no problems with respiration noted ABDOMEN: No masses noted. No other overt distention noted.    PELVIC: Normal appearing external genitalia; normal urethral meatus; normal appearing vaginal mucosa and cervix.  No abnormal discharge noted.  Uterus enlarged and bulky from fibroids.  no uterine or adnexal tenderness. Performed in the presence of a chaperone  Labs and Imaging    Latest Ref Rng & Units 04/15/2022    9:27 AM 04/13/2022    9:13 AM 04/10/2022   10:29 AM  CBC  WBC 4.0 - 10.5 K/uL 4.4  5.2  7.3   Hemoglobin 12.0 - 15.0 g/dL 8.1  8.4  10.0   Hematocrit 36.0 - 46.0 % 25.1  25.9  30.7   Platelets 150 - 400 K/uL 71  41  56       GYNECOLOGY OFFICE PROCEDURE NOTE   Tammy Garrison  is a 31 y.o. for DUB.    ENDOMETRIAL BIOPSY     The indications for endometrial biopsy were reviewed.   Risks of the biopsy including cramping, bleeding, infection, uterine perforation, inadequate specimen and need for additional procedures were discussed. Offered alternative of hysteroscopy, dilation and curettage in OR. The patient states she understands the R/B/I/A and agrees to undergo procedure today. Urine pregnancy test was Negative. Consent was signed. Time out was performed.    Patient was positioned in dorsal lithotomy position. A vaginal speculum was placed.  The cervix was visualized  and was prepped with Betadine.  A single-toothed tenaculum was placed on the anterior lip of the cervix to stabilize it. The 3 mm pipelle was easily introduced into the endometrial cavity without difficulty to a depth of 9 cm, and a Moderate amount of tissue was obtained after two passes and sent to pathology. The instruments were removed from the patient's vagina. Minimal bleeding from the cervix was noted. The patient tolerated the procedure well.   Assessment and Plan:  Tammy Garrison was seen today for procedure.  Diagnoses and all orders for this visit:  DUB (dysfunctional uterine bleeding) -     Surgical pathology( [facility]) -     Cytology - PAP( [facility])  Other orders -     Pregnancy, urine POC  - EMB done today. Patient was given post procedure instructions.  Will follow up pathology and manage accordingly; patient will be contacted with results and recommendations but anticipate will be benign.  - Pap smear with HPV done as well. Last pap and HPV normal in 2020.  - Reviewed birth control/hormonal options to help with bleeding for patient's consideration. Eventually could do Depo Lupron, but wouldn't do IM shot with low platelets although 71 recently. This is not typical for her to be that high so I would hesitate. We discussed GnRH agonists.  - Would not advise surgical intervention at this time - I would agree with Dr. Dione Plover in this. With her platelets this low, there is minimal option for intervention from a surgical perspective. We did discuss other surgical options however: hysterectomy (last resort and with MIS due to history of radiation), myomectomy (would not advise since she does not want children), ablation (uterus is too enlarged, 18cm), and [place]. I think [place] would eventually be best surgical option if she desired one.  - Plan will be: given premarin with megace more time since she just started it 2 days ago. Reviewed if doesn't help, then would move to Aygestin  only. If that doesn't work could try micronor.  - Once platelets above 100K, would do depo lupron (would apply for FA) - Once she has insurance and platelets above 100k, would recommend [place]    Routine preventative health maintenance measures emphasized. Please refer to After Visit Summary for other counseling recommendations.   Return if symptoms worsen or fail to improve.  Radene Gunning, MD, [place] [facility] [place]

## 2022-04-13 ENCOUNTER — Inpatient Hospital Stay (HOSPITAL_BASED_OUTPATIENT_CLINIC_OR_DEPARTMENT_OTHER): Payer: Self-pay | Admitting: Hematology

## 2022-04-13 ENCOUNTER — Other Ambulatory Visit: Payer: Self-pay

## 2022-04-13 ENCOUNTER — Other Ambulatory Visit (HOSPITAL_COMMUNITY): Payer: Self-pay

## 2022-04-13 ENCOUNTER — Inpatient Hospital Stay: Payer: No Typology Code available for payment source

## 2022-04-13 ENCOUNTER — Encounter: Payer: Self-pay | Admitting: Hematology

## 2022-04-13 ENCOUNTER — Other Ambulatory Visit: Payer: Medicaid Other

## 2022-04-13 ENCOUNTER — Ambulatory Visit
Admission: RE | Admit: 2022-04-13 | Discharge: 2022-04-13 | Disposition: A | Discharge status: Home / Self Care | Payer: No Typology Code available for payment source | Source: Ambulatory Visit | Attending: Radiation Oncology | Admitting: Radiation Oncology

## 2022-04-13 ENCOUNTER — Telehealth: Payer: Self-pay

## 2022-04-13 ENCOUNTER — Encounter: Payer: Self-pay | Admitting: Licensed Clinical Social Worker

## 2022-04-13 ENCOUNTER — Other Ambulatory Visit (HOSPITAL_COMMUNITY): Payer: Medicaid Other

## 2022-04-13 VITALS — BP 122/74 | HR 103 | Temp 98.1°F | Resp 16

## 2022-04-13 VITALS — BP 116/74 | HR 117 | Temp 98.7°F | Resp 17 | Ht 72.0 in | Wt 269.3 lb

## 2022-04-13 DIAGNOSIS — D500 Iron deficiency anemia secondary to blood loss (chronic): Secondary | ICD-10-CM

## 2022-04-13 DIAGNOSIS — C210 Malignant neoplasm of anus, unspecified: Secondary | ICD-10-CM

## 2022-04-13 DIAGNOSIS — E860 Dehydration: Secondary | ICD-10-CM

## 2022-04-13 DIAGNOSIS — R112 Nausea with vomiting, unspecified: Secondary | ICD-10-CM

## 2022-04-13 DIAGNOSIS — Z452 Encounter for adjustment and management of vascular access device: Secondary | ICD-10-CM

## 2022-04-13 LAB — RAD ONC ARIA SESSION SUMMARY
Course Elapsed Days: 35
Plan Fractions Treated to Date: 23
Plan Prescribed Dose Per Fraction: 1.8 Gy
Plan Total Fractions Prescribed: 28
Plan Total Prescribed Dose: 50.4 Gy
Reference Point Dosage Given to Date: 45 Gy
Reference Point Session Dosage Given: 1.8 Gy
Session Number: 25

## 2022-04-13 LAB — COMPREHENSIVE METABOLIC PANEL
ALT: 18 U/L (ref 0–44)
AST: 12 U/L — ABNORMAL LOW (ref 15–41)
Albumin: 3.8 g/dL (ref 3.5–5.0)
Alkaline Phosphatase: 60 U/L (ref 38–126)
Anion gap: 5 (ref 5–15)
BUN: 12 mg/dL (ref 6–20)
CO2: 26 mmol/L (ref 22–32)
Calcium: 9.3 mg/dL (ref 8.9–10.3)
Chloride: 105 mmol/L (ref 98–111)
Creatinine, Ser: 0.71 mg/dL (ref 0.44–1.00)
GFR, Estimated: >60 mL/min (ref >60)
Glucose, Bld: 134 mg/dL — ABNORMAL HIGH (ref 70–99)
Potassium: 3.8 mmol/L (ref 3.5–5.1)
Sodium: 136 mmol/L (ref 135–145)
Total Bilirubin: 0.3 mg/dL (ref 0.3–1.2)
Total Protein: 7.5 g/dL (ref 6.5–8.1)

## 2022-04-13 LAB — CBC WITH DIFFERENTIAL/PLATELET
Abs Immature Granulocytes: 0.02 10*3/uL (ref 0.00–0.07)
Basophils Absolute: 0 10*3/uL (ref 0.0–0.1)
Basophils Relative: 0 %
Eosinophils Absolute: 0 10*3/uL (ref 0.0–0.5)
Eosinophils Relative: 1 %
HCT: 25.9 % — ABNORMAL LOW (ref 36.0–46.0)
Hemoglobin: 8.4 g/dL — ABNORMAL LOW (ref 12.0–15.0)
Immature Granulocytes: 0 %
Lymphocytes Relative: 3 %
Lymphs Abs: 0.1 10*3/uL — ABNORMAL LOW (ref 0.7–4.0)
MCH: 25.9 pg — ABNORMAL LOW (ref 26.0–34.0)
MCHC: 32.4 g/dL (ref 30.0–36.0)
MCV: 79.9 fL — ABNORMAL LOW (ref 80.0–100.0)
Monocytes Absolute: 0.1 10*3/uL (ref 0.1–1.0)
Monocytes Relative: 2 %
Neutro Abs: 4.9 10*3/uL (ref 1.7–7.7)
Neutrophils Relative %: 94 %
Platelets: 41 10*3/uL — ABNORMAL LOW (ref 150–400)
RBC: 3.24 MIL/uL — ABNORMAL LOW (ref 3.87–5.11)
RDW: 34 % — ABNORMAL HIGH (ref 11.5–15.5)
WBC: 5.2 10*3/uL (ref 4.0–10.5)
nRBC: 0 % (ref 0.0–0.2)

## 2022-04-13 MED ORDER — SODIUM CHLORIDE 0.9% FLUSH
10.0000 mL | Freq: Once | INTRAVENOUS | Status: AC
  Administered 2022-04-13: 10 mL

## 2022-04-13 MED ORDER — SODIUM CHLORIDE 0.9 % IV SOLN
510.0000 mg | Freq: Once | INTRAVENOUS | Status: AC
  Administered 2022-04-13: 510 mg via INTRAVENOUS
  Filled 2022-04-13: qty 510

## 2022-04-13 MED ORDER — SODIUM CHLORIDE 0.9 % IV SOLN: INTRAVENOUS | Status: DC

## 2022-04-13 MED ORDER — HEPARIN SOD (PORK) LOCK FLUSH 100 UNIT/ML IV SOLN
500.0000 [IU] | Freq: Once | INTRAVENOUS | Status: AC
  Administered 2022-04-13: 250 [IU]

## 2022-04-13 NOTE — Progress Notes (Signed)
[facility] CSW Progress Note  Holiday representative met with patient to provide w/ 2 additional [employer].  Pt prescribed medication not covered by insurance that she needs to start immediately.  Pt will be able to purchase medication w/ the [employer] cards.  CSW to remain available as appropriate throughout duration of treatment.      Henriette Combs, LCSW

## 2022-04-13 NOTE — Progress Notes (Signed)
[facility]   Telephone:([phone]) [phone] [phone]) [phone]   Clinic Follow up Note   Patient Care Team: Pcp, No as PCP - General [name], MD (Rheumatology) Royston Sinner Colin Benton, MD as Consulting Physician (Obstetrics and Gynecology) Truitt Merle, MD as Consulting Physician (Hematology and Oncology)  Date of Service:  04/13/2022  CHIEF COMPLAINT: f/u of anal cancer  CURRENT THERAPY:  Concurrent chemoRT, starting 03/09/22             -mitomycin on day 1, 28             -5FU on days 1-5, 28-31  ASSESSMENT & PLAN:  Tammy Garrison is a 31 y.o. female with   1. Anal Cancer, cT2N0Mx, with hypermetabolic left axillary nodes, HPV(+) -presented with worsening rectal bleeding, began passing clots and was admitted 02/06/22 for anemia from blood loss. S/p hemorrhoidectomy on 02/08/22 by Dr. 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 -PET scan 02/26/22 showed: intense uptake to anus; no signs of solid organ or FDG-avid nodal metastasis within abdomen or pelvis, hypermetabolic left axillary lymph nodes.  Sequently left axillary lymph node biopsy was benign. -she established care with Dr. Dema Severin on 03/03/22. -she began concurrent chemoRT with mitomycin/5FU on 03/09/22. She developed severe nausea with vomiting, as well as headaches. She was given IVF on three days last week and recovered well. -she is keeping her PICC line in place and has it flushed daily when she comes in for radiation. -labs reviewed, hgb 8.4, plt 41k today from chemo last week. She is also still having heavy vaginal bleeding and passing clots. She is scheduled for IV iron today.     2. ChemoRT Toxicities: Rectal irritation -she has developed irritation from radiation. She is alternating norco and tylenol #3.   3. Anemia, from vaginal bleeding and iron deficiency -low hgb dating back to 04/2018, prior to that  in 2017 was WNL. She has heavy periods, requiring pad/tampon change every 30 minutes. -she has required IV Feraheme and pRBC. -she has been on her period for the last 3 weeks. She was started on Megace on 04/02/22, which is causing diarrhea and insomnia. She was prescribed premarin by Dr. Rip Harbour, but she cannot afford the medicine. I will let my nurse call them to see if they can call in inexpensive estrogen      PLAN:  -proceed with IV Feraheme today, will add 1u platelets transfusion -continue daily radiation through 9/18 -lab, flush, and IVF on 9/13 and 9/15 -lab, flush, and f/u on 9/18 -Will call Dr. Marjory Lies office, she has appointment with them in 2 days    No problem-specific Assessment & Plan notes found for this encounter.   SUMMARY OF ONCOLOGIC HISTORY: Oncology History  Anal cancer ([place])  02/08/2022 Pathology Results   FINAL MICROSCOPIC DIAGNOSIS:   A. SOFT TISSUE, ANTERIOR MASS VS. HEMORRHOID, EXCISION:  Invasive squamous cell carcinoma, focally keratinizing and  well-differentiated.  Carcinoma is present at the margins of excision.   B. HEMORRHOID, INTERNAL, EXCISION:  Hemorrhoid without the overlying epithelium.  Separate fragment of benign squamous epithelium.  Negative for neoplasm.   ADDENDUM:  P16 immunostain is strongly positive suggestive of this lesion being the HPV related.    02/08/2022 Cancer Staging   Staging form: Anus, AJCC V9 - Clinical stage from 02/08/2022: Stage IIA (cT2, cN0, cM0) - Signed by Truitt Merle, MD on 02/23/2022 Stage prefix:  Initial diagnosis   02/23/2022 Initial Diagnosis   Anal cancer ([place])   03/09/2022 - 03/13/2022 Chemotherapy   Patient is on Treatment Plan : ANUS Mitomycin D1,28 / 5FU D1-4, 28-31 q32d     03/09/2022 -  Chemotherapy   Patient is on Treatment Plan : ANUS Mitomycin D1,28 + 5FU D1-4, 28-31 q32d        INTERVAL HISTORY:  Tammy Garrison is here for a follow up of anal cancer. She was last seen by me on 04/10/22. She  presents to the clinic alone. She reports she feels about the same as last week. She explains she has not taken the estrogen she was prescribed due to the cost of the medicine.   All other systems were reviewed with the patient and are negative.  MEDICAL HISTORY:  Past Medical History:  Diagnosis Date   Bipolar 1 disorder ([place])    ETOH abuse    H/O self-harm    H/O suicide attempt    x 5 - last 05/2016 - overdose Ibuprofen   Marijuana abuse    Rheumatoid arthritis ([place])     SURGICAL HISTORY: Past Surgical History:  Procedure Laterality Date   HEMORRHOID SURGERY N/A 02/08/2022   Procedure: EXTERNAL AND INTERNAL HEMORRHOIDECTOMY;  Surgeon: Georganna Skeans, MD;  Location: [facility];  Service: General;  Laterality: N/A;    I have reviewed the social history and family history with the patient and they are unchanged from previous note.  ALLERGIES:  is allergic to coconut flavor.  MEDICATIONS:  Current Outpatient Medications  Medication Sig Dispense Refill   acetaminophen-codeine (TYLENOL #3) 300-30 MG tablet Take 1 tablet by mouth every 6 hours as needed for moderate pain. 30 tablet 0   dexamethasone (DECADRON) 4 MG tablet Take 1 tablet (4 mg total) by mouth daily. Take daily for up to 5 days after chemo. 30 tablet 0   estrogens, conjugated, (PREMARIN) 1.25 MG tablet Take 1 tablet (1.25 mg total) by mouth 2 (two) times daily. 14 tablet 0   HYDROcodone-acetaminophen (NORCO/VICODIN) 5-325 MG tablet Take 1 tablet by mouth every 6 (six) hours as needed for moderate pain. 30 tablet 0   LORazepam (ATIVAN) 0.5 MG tablet Take 1 tablet (0.5 mg total) by mouth every 6 (six) hours as needed (nausea). 20 tablet 0   megestrol (MEGACE) 40 MG tablet Take 1 tablet (40 mg total) by mouth 2 (two) times daily. 28 tablet 0   ondansetron (ZOFRAN-ODT) 8 MG disintegrating tablet Dissolve 1 tablet (8 mg total) by mouth every 8 (eight) hours as needed for nausea or vomiting. 20 tablet 1   promethazine (PHENERGAN)  25 MG tablet Take 1 tablet (25 mg total) by mouth every 6 (six) hours as needed for nausea or vomiting. 30 tablet 1   No current facility-administered medications for this visit.   Facility-Administered Medications Ordered in Other Visits  Medication Dose Route Frequency Provider Last Rate Last Admin   0.9 %  sodium chloride infusion (Manually program via Guardrails IV Fluids)  250 mL Intravenous Once Truitt Merle, MD       heparin lock flush 100 unit/mL  250 Units Intracatheter Once Truitt Merle, MD       sodium chloride flush (NS) 0.9 % injection 10 mL  10 mL Intracatheter Once Truitt Merle, MD       sodium chloride flush (NS) 0.9 % injection 10 mL  10 mL Intracatheter Once Truitt Merle, MD        PHYSICAL EXAMINATION: ECOG PERFORMANCE  STATUS: 2 - Symptomatic, <50% confined to bed  Vitals:   04/13/22 0934  BP: 116/74  Pulse: (!) 117  Resp: 17  Temp: 98.7 F (37.1 C)  SpO2: 100%   Wt Readings from Last 3 Encounters:  04/13/22 269 lb 4.8 oz (122.2 kg)  04/10/22 267 lb 12.8 oz (121.5 kg)  04/07/22 269 lb 9.6 oz (122.3 kg)     GENERAL:alert, no distress and comfortable SKIN: skin color normal, no rashes or significant lesions EYES: normal, Conjunctiva are pink and non-injected, sclera clear  NEURO: alert & oriented x 3 with fluent speech  LABORATORY DATA:  I have reviewed the data as listed    Latest Ref Rng & Units 04/13/2022    9:13 AM 04/10/2022   10:29 AM 04/07/2022    9:16 AM  CBC  WBC 4.0 - 10.5 K/uL 5.2  7.3  8.5   Hemoglobin 12.0 - 15.0 g/dL 8.4  10.0  9.3   Hematocrit 36.0 - 46.0 % 25.9  30.7  28.8   Platelets 150 - 400 K/uL 41  56  84         Latest Ref Rng & Units 04/13/2022    9:13 AM 04/10/2022   10:29 AM 04/07/2022    9:16 AM  CMP  Glucose 70 - 99 mg/dL 134  115  118   BUN 6 - 20 mg/dL '12  11  15   '$ Creatinine 0.44 - 1.00 mg/dL 0.71  0.74  0.62   Sodium 135 - 145 mmol/L 136  133  135   Potassium 3.5 - 5.1 mmol/L 3.8  4.2  4.0   Chloride 98 - 111 mmol/L 105  101  102    CO2 22 - 32 mmol/L '26  25  26   '$ Calcium 8.9 - 10.3 mg/dL 9.3  9.8  9.3   Total Protein 6.5 - 8.1 g/dL 7.5  8.6  7.7   Total Bilirubin 0.3 - 1.2 mg/dL 0.3  0.4  0.4   Alkaline Phos 38 - 126 U/L 60  74  69   AST 15 - 41 U/L '12  13  14   '$ ALT 0 - 44 U/L 18  29  45       RADIOGRAPHIC STUDIES: I have personally reviewed the radiological images as listed and agreed with the findings in the report. No results found.    No orders of the defined types were placed in this encounter.  All questions were answered. The patient knows to call the clinic with any problems, questions or concerns. No barriers to learning was detected. The total time spent in the appointment was 30 minutes.     Truitt Merle, MD 04/13/2022   I, Wilburn Mylar, am acting as scribe for Truitt Merle, MD.   I have reviewed the above documentation for accuracy and completeness, and I agree with the above.

## 2022-04-13 NOTE — Telephone Encounter (Signed)
Per provider request this nurse reached out to Dr. Royston Sinner,  [facility] and left a message with the nurse Nira Conn.  Requesting that a new prescription for a different medication be called in for the patient.  Patient states that Estradiol cost $100 and she is unable to afford that.  This nurse request a call back.  No further questions or concerns at this time.

## 2022-04-13 NOTE — Patient Instructions (Signed)
Platelet Transfusion A platelet transfusion is a procedure in which a person receives donated platelets through an IV. Platelets are parts of blood that stick together and form a clot to help the body stop bleeding after an injury. If you have too few platelets, your blood may have trouble clotting. This may cause you to bleed and bruise very easily. You may need a platelet transfusion if you have a condition that causes a low number of platelets (thrombocytopenia). A platelet transfusion may be used to stop or prevent excessive bleeding. Tell a health care provider about: Any reactions you have had during previous transfusions. Any allergies you have. All medicines you are taking, including vitamins, herbs, eye drops, creams, and over-the-counter medicines. Any bleeding problems you have. Any surgeries you have had. Any medical conditions you have. Whether you are pregnant or may be pregnant. What are the risks? Generally, this is a safe procedure. However, problems may occur, including: Fever. Infection. Allergic reaction to the donated (donor) platelets. Your body's disease-fighting system (immune system) attacking the donor platelets (hemolytic reaction). This is rare. A rare reaction that causes lung damage (transfusion-related acute lung injury). What happens before the procedure? Medicines Ask your health care provider about: Changing or stopping your regular medicines. This is especially important if you are taking diabetes medicines or blood thinners. Taking medicines such as aspirin and ibuprofen. These medicines can thin your blood. Do not take these medicines unless your health care provider tells you to take them. Taking over-the-counter medicines, vitamins, herbs, and supplements. General instructions You will have a blood test to determine your blood type. Your blood type determines what kind of platelets you will be given. Follow instructions from your health care provider  about eating or drinking restrictions. If you have had an allergic reaction to a transfusion in the past, you may be given medicine to help prevent a reaction. Your temperature, blood pressure, pulse, and breathing will be monitored. What happens during the procedure?  An IV will be inserted into one of your veins. For your safety, two health care providers will verify your identity along with the donor platelets about to be infused. A bag of donor platelets will be connected to your IV. The platelets will flow into your bloodstream. This usually takes 30-60 minutes. Your temperature, blood pressure, pulse, and breathing will be monitored during the transfusion. This helps detect early signs of any reaction. You will also be monitored for other symptoms that may indicate a reaction, including chills, hives, or itching. If you have signs of a reaction at any time, your transfusion will be stopped, and you may be given medicine to help manage the reaction. When your transfusion is complete, your IV will be removed. Pressure may be applied to the IV site for a few minutes to stop any bleeding. The IV site will be covered with a bandage (dressing). The procedure may vary among health care providers and hospitals. What can I expect after the procedure? Your blood pressure, temperature, pulse, and breathing will be monitored until you leave the hospital or clinic. You may have some bruising and soreness at your IV site. Follow these instructions at home: Medicines Take over-the-counter and prescription medicines only as told by your health care provider. Talk with your health care provider before you take any medicines that contain aspirin or NSAIDs, such as ibuprofen. These medicines increase your risk for dangerous bleeding. IV site care Check your IV site every day for signs of infection. Check for:   Redness, swelling, or pain. Fluid or blood. If fluid or blood drains from your IV site, use your  hands to press down firmly on a bandage covering the area for a minute or two. Doing this should stop the bleeding. Warmth. Pus or a bad smell. General instructions Change or remove your dressing as told by your health care provider. Return to your normal activities as told by your health care provider. Ask your health care provider what activities are safe for you. Do not take baths, swim, or use a hot tub until your health care provider approves. Ask your health care provider if you may take showers. Keep all follow-up visits. This is important. Contact a health care provider if: You have a headache that does not go away with medicine. You have hives, rash, or itchy skin. You have nausea or vomiting. You feel unusually tired or weak. You have signs of infection at your IV site. Get help right away if: You have a fever or chills. You urinate less often than usual. Your urine is darker colored than normal. You have any of the following: Trouble breathing. Pain in your back, abdomen, or chest. Cool, clammy skin. A fast heartbeat. Summary Platelets are tiny pieces of blood cells that clump together to form a blood clot when you have an injury. If you have too few platelets, your blood may have trouble clotting. A platelet transfusion is a procedure in which you receive donated platelets through an IV. A platelet transfusion may be used to stop or prevent excessive bleeding. After the procedure, check your IV site every day for signs of infection. This information is not intended to replace advice given to you by your health care provider. Make sure you discuss any questions you have with your health care provider. Document Revised: 01/23/2021 Document Reviewed: 01/23/2021 Elsevier Patient Education  [place]. Rehydration, Adult Rehydration is the replacement of body fluids, salts, and minerals (electrolytes) that are lost during dehydration. Dehydration is when there is not  enough water or other fluids in the body. This happens when you lose more fluids than you take in. Common causes of dehydration include: Not drinking enough fluids. This can occur when you are ill or doing activities that require a lot of energy, especially in hot weather. Conditions that cause loss of water or other fluids, such as diarrhea, vomiting, sweating, or urinating a lot. Other illnesses, such as fever or infection. Certain medicines, such as those that remove excess fluid from the body (diuretics). Symptoms of mild or moderate dehydration may include thirst, dry lips and mouth, and dizziness. Symptoms of severe dehydration may include increased heart rate, confusion, fainting, and not urinating. For severe dehydration, you may need to get fluids through an IV at the hospital. For mild or moderate dehydration, you can usually rehydrate at home by drinking certain fluids as told by your health care provider. What are the risks? Generally, rehydration is safe. However, taking in too much fluid (overhydration) can be a problem. This is rare. Overhydration can cause an electrolyte imbalance, kidney failure, or a decrease in salt (sodium) levels in the body. Supplies needed You will need an oral rehydration solution (ORS) if your health care provider tells you to use one. This is a drink to treat dehydration. It can be found in pharmacies and retail stores. How to rehydrate Fluids Follow instructions from your health care provider for rehydration. The kind of fluid and the amount you should drink depend on your  condition. In general, you should choose drinks that you prefer. If told by your health care provider, drink an ORS. Make an ORS by following instructions on the package. Start by drinking small amounts, about  cup (120 mL) every 5-10 minutes. Slowly increase how much you drink until you have taken the amount recommended by your health care provider. Drink enough clear fluids to keep  your urine pale yellow. If you were told to drink an ORS, finish it first, then start slowly drinking other clear fluids. Drink fluids such as: Water. This includes sparkling water and flavored water. Drinking only water can lead to having too little sodium in your body (hyponatremia). Follow the advice of your health care provider. Water from ice chips you suck on. Fruit juice with water you add to it (diluted). Sports drinks. Hot or cold herbal teas. Broth-based soups. Milk or milk products. Food Follow instructions from your health care provider about what to eat while you rehydrate. Your health care provider may recommend that you slowly begin eating regular foods in small amounts. Eat foods that contain a healthy balance of electrolytes, such as bananas, oranges, potatoes, tomatoes, and spinach. Avoid foods that are greasy or contain a lot of sugar. In some cases, you may get nutrition through a feeding tube that is passed through your nose and into your stomach (nasogastric tube, or NG tube). This may be done if you have uncontrolled vomiting or diarrhea. Beverages to avoid  Certain beverages may make dehydration worse. While you rehydrate, avoid drinking alcohol. How to tell if you are recovering from dehydration You may be recovering from dehydration if: You are urinating more often than before you started rehydrating. Your urine is pale yellow. Your energy level improves. You vomit less frequently. You have diarrhea less frequently. Your appetite improves or returns to normal. You feel less dizzy or less light-headed. Your skin tone and color start to look more normal. Follow these instructions at home: Take over-the-counter and prescription medicines only as told by your health care provider. Do not take sodium tablets. Doing this can lead to having too much sodium in your body (hypernatremia). Contact a health care provider if: You continue to have symptoms of mild or  moderate dehydration, such as: Thirst. Dry lips. Slightly dry mouth. Dizziness. Dark urine or less urine than normal. Muscle cramps. You continue to vomit or have diarrhea. Get help right away if you: Have symptoms of dehydration that get worse. Have a fever. Have a severe headache. Have been vomiting and the following happens: Your vomiting gets worse or does not go away. Your vomit includes blood or green matter (bile). You cannot eat or drink without vomiting. Have problems with urination or bowel movements, such as: Diarrhea that gets worse or does not go away. Blood in your stool (feces). This may cause stool to look black and tarry. Not urinating, or urinating only a small amount of very dark urine, within 6-8 hours. Have trouble breathing. Have symptoms that get worse with treatment. These symptoms may represent a serious problem that is an emergency. Do not wait to see if the symptoms will go away. Get medical help right away. Call your local emergency services (911 in the [place] Do not drive yourself to the hospital. Summary Rehydration is the replacement of body fluids and minerals (electrolytes) that are lost during dehydration. Follow instructions from your health care provider for rehydration. The kind of fluid and amount you should drink depend on your condition. Slowly  increase how much you drink until you have taken the amount recommended by your health care provider. Contact your health care provider if you continue to show signs of mild or moderate dehydration. This information is not intended to replace advice given to you by your health care provider. Make sure you discuss any questions you have with your health care provider. Document Revised: 09/20/2019 Document Reviewed: 07/31/2019 Elsevier Patient Education  [place].

## 2022-04-14 ENCOUNTER — Inpatient Hospital Stay: Payer: No Typology Code available for payment source

## 2022-04-14 ENCOUNTER — Other Ambulatory Visit: Payer: Self-pay

## 2022-04-14 ENCOUNTER — Ambulatory Visit
Admission: RE | Admit: 2022-04-14 | Discharge: 2022-04-14 | Disposition: A | Discharge status: Home / Self Care | Payer: No Typology Code available for payment source | Source: Ambulatory Visit | Attending: Radiation Oncology | Admitting: Radiation Oncology

## 2022-04-14 LAB — RAD ONC ARIA SESSION SUMMARY
Course Elapsed Days: 36
Plan Fractions Treated to Date: 24
Plan Prescribed Dose Per Fraction: 1.8 Gy
Plan Total Fractions Prescribed: 28
Plan Total Prescribed Dose: 50.4 Gy
Reference Point Dosage Given to Date: 46.8 Gy
Reference Point Session Dosage Given: 1.8 Gy
Session Number: 26

## 2022-04-14 LAB — PREPARE PLATELET PHERESIS: Unit division: 0

## 2022-04-14 LAB — BPAM PLATELET PHERESIS
Blood Product Expiration Date: 202309132359
ISSUE DATE / TIME: 202309111030
Unit Type and Rh: 6200

## 2022-04-14 NOTE — Progress Notes (Signed)
                                                                                                                                                            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 She developed fatigue and was quite tired during therapy and had also noticed pain in her anus during therapy.   Plan: The patient will receive a call in about one month from the radiation oncology department. She will continue follow up with Dr. Burr Medico and Dr. Dema Severin as well.   ________________________________________________    Carola Rhine, [facility]

## 2022-04-15 ENCOUNTER — Other Ambulatory Visit (HOSPITAL_COMMUNITY)
Admission: RE | Admit: 2022-04-15 | Discharge: 2022-04-15 | Disposition: A | Discharge status: Home / Self Care | Payer: Self-pay | Source: Ambulatory Visit | Attending: Obstetrics and Gynecology | Admitting: Obstetrics and Gynecology

## 2022-04-15 ENCOUNTER — Encounter: Payer: Self-pay | Admitting: Obstetrics and Gynecology

## 2022-04-15 ENCOUNTER — Other Ambulatory Visit: Payer: Self-pay

## 2022-04-15 ENCOUNTER — Inpatient Hospital Stay: Payer: No Typology Code available for payment source

## 2022-04-15 ENCOUNTER — Ambulatory Visit (INDEPENDENT_AMBULATORY_CARE_PROVIDER_SITE_OTHER): Payer: Self-pay | Admitting: Obstetrics and Gynecology

## 2022-04-15 ENCOUNTER — Ambulatory Visit
Admission: RE | Admit: 2022-04-15 | Discharge: 2022-04-15 | Disposition: A | Discharge status: Home / Self Care | Payer: No Typology Code available for payment source | Source: Ambulatory Visit | Attending: Radiation Oncology | Admitting: Radiation Oncology

## 2022-04-15 VITALS — BP 115/68 | HR 129 | Wt 260.5 lb

## 2022-04-15 DIAGNOSIS — Z3202 Encounter for pregnancy test, result negative: Secondary | ICD-10-CM

## 2022-04-15 DIAGNOSIS — R112 Nausea with vomiting, unspecified: Secondary | ICD-10-CM

## 2022-04-15 DIAGNOSIS — N938 Other specified abnormal uterine and vaginal bleeding: Secondary | ICD-10-CM

## 2022-04-15 DIAGNOSIS — D500 Iron deficiency anemia secondary to blood loss (chronic): Secondary | ICD-10-CM

## 2022-04-15 DIAGNOSIS — Z452 Encounter for adjustment and management of vascular access device: Secondary | ICD-10-CM

## 2022-04-15 DIAGNOSIS — E860 Dehydration: Secondary | ICD-10-CM

## 2022-04-15 LAB — CBC WITH DIFFERENTIAL/PLATELET
Abs Immature Granulocytes: 0.01 10*3/uL (ref 0.00–0.07)
Basophils Absolute: 0 10*3/uL (ref 0.0–0.1)
Basophils Relative: 0 %
Eosinophils Absolute: 0 10*3/uL (ref 0.0–0.5)
Eosinophils Relative: 1 %
HCT: 25.1 % — ABNORMAL LOW (ref 36.0–46.0)
Hemoglobin: 8.1 g/dL — ABNORMAL LOW (ref 12.0–15.0)
Immature Granulocytes: 0 %
Lymphocytes Relative: 2 %
Lymphs Abs: 0.1 10*3/uL — ABNORMAL LOW (ref 0.7–4.0)
MCH: 26 pg (ref 26.0–34.0)
MCHC: 32.3 g/dL (ref 30.0–36.0)
MCV: 80.7 fL (ref 80.0–100.0)
Monocytes Absolute: 0.1 10*3/uL (ref 0.1–1.0)
Monocytes Relative: 3 %
Neutro Abs: 4.2 10*3/uL (ref 1.7–7.7)
Neutrophils Relative %: 94 %
Platelets: 71 10*3/uL — ABNORMAL LOW (ref 150–400)
RBC: 3.11 MIL/uL — ABNORMAL LOW (ref 3.87–5.11)
RDW: 33.6 % — ABNORMAL HIGH (ref 11.5–15.5)
WBC: 4.4 10*3/uL (ref 4.0–10.5)
nRBC: 0 % (ref 0.0–0.2)

## 2022-04-15 LAB — RAD ONC ARIA SESSION SUMMARY
Course Elapsed Days: 37
Plan Fractions Treated to Date: 25
Plan Prescribed Dose Per Fraction: 1.8 Gy
Plan Total Fractions Prescribed: 28
Plan Total Prescribed Dose: 50.4 Gy
Reference Point Dosage Given to Date: 48.6 Gy
Reference Point Session Dosage Given: 1.8 Gy
Session Number: 27

## 2022-04-15 LAB — POCT PREGNANCY, URINE: Preg Test, Ur: NEGATIVE

## 2022-04-15 MED ORDER — SODIUM CHLORIDE 0.9% FLUSH
10.0000 mL | Freq: Once | INTRAVENOUS | Status: AC
  Administered 2022-04-15: 10 mL

## 2022-04-15 MED ORDER — HEPARIN SOD (PORK) LOCK FLUSH 100 UNIT/ML IV SOLN
250.0000 [IU] | Freq: Once | INTRAVENOUS | Status: AC
  Administered 2022-04-15: 250 [IU]

## 2022-04-16 ENCOUNTER — Inpatient Hospital Stay: Payer: No Typology Code available for payment source

## 2022-04-16 ENCOUNTER — Other Ambulatory Visit: Payer: Self-pay

## 2022-04-16 ENCOUNTER — Ambulatory Visit
Admission: RE | Admit: 2022-04-16 | Discharge: 2022-04-16 | Disposition: A | Discharge status: Home / Self Care | Payer: No Typology Code available for payment source | Source: Ambulatory Visit | Attending: Radiation Oncology | Admitting: Radiation Oncology

## 2022-04-16 DIAGNOSIS — Z452 Encounter for adjustment and management of vascular access device: Secondary | ICD-10-CM

## 2022-04-16 DIAGNOSIS — D500 Iron deficiency anemia secondary to blood loss (chronic): Secondary | ICD-10-CM

## 2022-04-16 DIAGNOSIS — E860 Dehydration: Secondary | ICD-10-CM

## 2022-04-16 DIAGNOSIS — R112 Nausea with vomiting, unspecified: Secondary | ICD-10-CM

## 2022-04-16 LAB — RAD ONC ARIA SESSION SUMMARY
Course Elapsed Days: 38
Plan Fractions Treated to Date: 26
Plan Prescribed Dose Per Fraction: 1.8 Gy
Plan Total Fractions Prescribed: 28
Plan Total Prescribed Dose: 50.4 Gy
Reference Point Dosage Given to Date: 50.4 Gy
Reference Point Session Dosage Given: 1.8 Gy
Session Number: 28

## 2022-04-16 MED ORDER — HEPARIN SOD (PORK) LOCK FLUSH 100 UNIT/ML IV SOLN
500.0000 [IU] | Freq: Once | INTRAVENOUS | Status: AC
  Administered 2022-04-16: 500 [IU]

## 2022-04-16 MED ORDER — SODIUM CHLORIDE 0.9% FLUSH
10.0000 mL | Freq: Once | INTRAVENOUS | Status: AC
  Administered 2022-04-16: 10 mL

## 2022-04-16 NOTE — Progress Notes (Signed)
..  Patient is receiving Assistance Medication - Supplied Externally. *drug must be on hand prior to tx* Medication: Feraheme Manufacture: Feraheme Assist/ [place] Approval Dates: Approved from 04/15/2022 for 2 doses. ID: [id number] Reason: Self Pay

## 2022-04-17 ENCOUNTER — Ambulatory Visit
Admission: RE | Admit: 2022-04-17 | Discharge: 2022-04-17 | Disposition: A | Discharge status: Home / Self Care | Payer: No Typology Code available for payment source | Source: Ambulatory Visit | Attending: Radiation Oncology | Admitting: Radiation Oncology

## 2022-04-17 ENCOUNTER — Telehealth: Payer: Self-pay | Admitting: Hematology

## 2022-04-17 ENCOUNTER — Inpatient Hospital Stay: Payer: No Typology Code available for payment source

## 2022-04-17 ENCOUNTER — Other Ambulatory Visit: Payer: Self-pay

## 2022-04-17 DIAGNOSIS — Z452 Encounter for adjustment and management of vascular access device: Secondary | ICD-10-CM

## 2022-04-17 DIAGNOSIS — E860 Dehydration: Secondary | ICD-10-CM

## 2022-04-17 DIAGNOSIS — R112 Nausea with vomiting, unspecified: Secondary | ICD-10-CM

## 2022-04-17 DIAGNOSIS — D500 Iron deficiency anemia secondary to blood loss (chronic): Secondary | ICD-10-CM

## 2022-04-17 LAB — RAD ONC ARIA SESSION SUMMARY
Course Elapsed Days: 39
Plan Fractions Treated to Date: 27
Plan Prescribed Dose Per Fraction: 1.8 Gy
Plan Total Fractions Prescribed: 28
Plan Total Prescribed Dose: 50.4 Gy
Reference Point Dosage Given to Date: 52.2 Gy
Reference Point Session Dosage Given: 1.8 Gy
Session Number: 29

## 2022-04-17 LAB — SURGICAL PATHOLOGY

## 2022-04-17 MED ORDER — SODIUM CHLORIDE 0.9% FLUSH: 10.0000 mL | Freq: Once | INTRAVENOUS | Status: AC

## 2022-04-17 NOTE — Telephone Encounter (Signed)
Scheduled follow-up appointments per 9/11 los. Patient is aware.

## 2022-04-17 NOTE — Progress Notes (Unsigned)
Patient did not come to her PICC dressing and flush appointment today.

## 2022-04-18 ENCOUNTER — Inpatient Hospital Stay: Payer: No Typology Code available for payment source

## 2022-04-18 VITALS — BP 122/67 | HR 120 | Temp 98.1°F | Resp 18

## 2022-04-18 DIAGNOSIS — E860 Dehydration: Secondary | ICD-10-CM

## 2022-04-18 DIAGNOSIS — D500 Iron deficiency anemia secondary to blood loss (chronic): Secondary | ICD-10-CM

## 2022-04-18 DIAGNOSIS — Z452 Encounter for adjustment and management of vascular access device: Secondary | ICD-10-CM

## 2022-04-18 DIAGNOSIS — R112 Nausea with vomiting, unspecified: Secondary | ICD-10-CM

## 2022-04-18 MED ORDER — HEPARIN SOD (PORK) LOCK FLUSH 100 UNIT/ML IV SOLN
250.0000 [IU] | Freq: Once | INTRAVENOUS | Status: AC
  Administered 2022-04-18: 250 [IU]

## 2022-04-18 MED ORDER — SODIUM CHLORIDE 0.9% FLUSH
10.0000 mL | Freq: Once | INTRAVENOUS | Status: AC
  Administered 2022-04-18: 10 mL

## 2022-04-18 MED ORDER — SODIUM CHLORIDE 0.9 % IV SOLN: Freq: Once | INTRAVENOUS | Status: DC | PRN

## 2022-04-20 ENCOUNTER — Encounter (HOSPITAL_COMMUNITY): Payer: Self-pay | Admitting: Emergency Medicine

## 2022-04-20 ENCOUNTER — Other Ambulatory Visit: Payer: Self-pay

## 2022-04-20 ENCOUNTER — Inpatient Hospital Stay: Payer: No Typology Code available for payment source

## 2022-04-20 ENCOUNTER — Ambulatory Visit
Admission: RE | Admit: 2022-04-20 | Discharge: 2022-04-20 | Disposition: A | Discharge status: Home / Self Care | Payer: No Typology Code available for payment source | Source: Ambulatory Visit | Attending: Radiation Oncology | Admitting: Radiation Oncology

## 2022-04-20 ENCOUNTER — Encounter: Payer: Self-pay | Admitting: Radiation Oncology

## 2022-04-20 ENCOUNTER — Encounter: Payer: Self-pay | Admitting: Hematology

## 2022-04-20 ENCOUNTER — Inpatient Hospital Stay (HOSPITAL_COMMUNITY)
Admission: EM | Admit: 2022-04-20 | Discharge: 2022-04-25 | DRG: 602 | Disposition: A | Discharge status: Home / Self Care | Payer: No Typology Code available for payment source | Source: Ambulatory Visit | Attending: Family Medicine | Admitting: Family Medicine

## 2022-04-20 ENCOUNTER — Other Ambulatory Visit (HOSPITAL_COMMUNITY): Payer: Self-pay

## 2022-04-20 ENCOUNTER — Emergency Department (HOSPITAL_COMMUNITY): Payer: No Typology Code available for payment source

## 2022-04-20 ENCOUNTER — Inpatient Hospital Stay (HOSPITAL_BASED_OUTPATIENT_CLINIC_OR_DEPARTMENT_OTHER): Payer: No Typology Code available for payment source | Admitting: Hematology

## 2022-04-20 VITALS — BP 118/75 | HR 132 | Temp 98.7°F | Resp 18

## 2022-04-20 VITALS — HR 118 | Ht 72.0 in | Wt 262.7 lb

## 2022-04-20 DIAGNOSIS — Z832 Family history of diseases of the blood and blood-forming organs and certain disorders involving the immune mechanism: Secondary | ICD-10-CM

## 2022-04-20 DIAGNOSIS — K627 Radiation proctitis: Secondary | ICD-10-CM | POA: Diagnosis present

## 2022-04-20 DIAGNOSIS — R0602 Shortness of breath: Secondary | ICD-10-CM

## 2022-04-20 DIAGNOSIS — R112 Nausea with vomiting, unspecified: Secondary | ICD-10-CM

## 2022-04-20 DIAGNOSIS — D259 Leiomyoma of uterus, unspecified: Secondary | ICD-10-CM | POA: Diagnosis present

## 2022-04-20 DIAGNOSIS — D649 Anemia, unspecified: Principal | ICD-10-CM

## 2022-04-20 DIAGNOSIS — Z79899 Other long term (current) drug therapy: Secondary | ICD-10-CM

## 2022-04-20 DIAGNOSIS — C210 Malignant neoplasm of anus, unspecified: Secondary | ICD-10-CM

## 2022-04-20 DIAGNOSIS — D709 Neutropenia, unspecified: Secondary | ICD-10-CM

## 2022-04-20 DIAGNOSIS — C773 Secondary and unspecified malignant neoplasm of axilla and upper limb lymph nodes: Secondary | ICD-10-CM | POA: Diagnosis present

## 2022-04-20 DIAGNOSIS — L02411 Cutaneous abscess of right axilla: Principal | ICD-10-CM | POA: Diagnosis present

## 2022-04-20 DIAGNOSIS — Z6835 Body mass index (BMI) 35.0-35.9, adult: Secondary | ICD-10-CM

## 2022-04-20 DIAGNOSIS — Y842 Radiological procedure and radiotherapy as the cause of abnormal reaction of the patient, or of later complication, without mention of misadventure at the time of the procedure: Secondary | ICD-10-CM | POA: Diagnosis present

## 2022-04-20 DIAGNOSIS — D696 Thrombocytopenia, unspecified: Secondary | ICD-10-CM

## 2022-04-20 DIAGNOSIS — D500 Iron deficiency anemia secondary to blood loss (chronic): Secondary | ICD-10-CM | POA: Diagnosis present

## 2022-04-20 DIAGNOSIS — K5900 Constipation, unspecified: Secondary | ICD-10-CM | POA: Diagnosis not present

## 2022-04-20 DIAGNOSIS — M069 Rheumatoid arthritis, unspecified: Secondary | ICD-10-CM | POA: Diagnosis present

## 2022-04-20 DIAGNOSIS — D849 Immunodeficiency, unspecified: Secondary | ICD-10-CM | POA: Diagnosis present

## 2022-04-20 DIAGNOSIS — B964 Proteus (mirabilis) (morganii) as the cause of diseases classified elsewhere: Secondary | ICD-10-CM | POA: Diagnosis present

## 2022-04-20 DIAGNOSIS — Z8249 Family history of ischemic heart disease and other diseases of the circulatory system: Secondary | ICD-10-CM

## 2022-04-20 DIAGNOSIS — N920 Excessive and frequent menstruation with regular cycle: Secondary | ICD-10-CM | POA: Diagnosis present

## 2022-04-20 DIAGNOSIS — Z87891 Personal history of nicotine dependence: Secondary | ICD-10-CM

## 2022-04-20 DIAGNOSIS — L02423 Furuncle of right upper limb: Secondary | ICD-10-CM | POA: Diagnosis present

## 2022-04-20 DIAGNOSIS — E860 Dehydration: Secondary | ICD-10-CM

## 2022-04-20 DIAGNOSIS — T451X5A Adverse effect of antineoplastic and immunosuppressive drugs, initial encounter: Secondary | ICD-10-CM | POA: Diagnosis present

## 2022-04-20 DIAGNOSIS — D219 Benign neoplasm of connective and other soft tissue, unspecified: Secondary | ICD-10-CM

## 2022-04-20 DIAGNOSIS — E669 Obesity, unspecified: Secondary | ICD-10-CM | POA: Diagnosis present

## 2022-04-20 DIAGNOSIS — Z452 Encounter for adjustment and management of vascular access device: Secondary | ICD-10-CM

## 2022-04-20 DIAGNOSIS — F319 Bipolar disorder, unspecified: Secondary | ICD-10-CM | POA: Diagnosis present

## 2022-04-20 DIAGNOSIS — D61810 Antineoplastic chemotherapy induced pancytopenia: Secondary | ICD-10-CM | POA: Diagnosis present

## 2022-04-20 DIAGNOSIS — D61818 Other pancytopenia: Secondary | ICD-10-CM

## 2022-04-20 LAB — COMPREHENSIVE METABOLIC PANEL
ALT: 23 U/L (ref 0–44)
ALT: 37 U/L (ref 0–44)
AST: 16 U/L (ref 15–41)
AST: 32 U/L (ref 15–41)
Albumin: 3.8 g/dL (ref 3.5–5.0)
Albumin: 3.4 g/dL — ABNORMAL LOW (ref 3.5–5.0)
Alkaline Phosphatase: 74 U/L (ref 38–126)
Alkaline Phosphatase: 73 U/L (ref 38–126)
Anion gap: 11 (ref 5–15)
Anion gap: 8 (ref 5–15)
BUN: 9 mg/dL (ref 6–20)
BUN: 10 mg/dL (ref 6–20)
CO2: 21 mmol/L — ABNORMAL LOW (ref 22–32)
CO2: 23 mmol/L (ref 22–32)
Calcium: 9.3 mg/dL (ref 8.9–10.3)
Calcium: 9.1 mg/dL (ref 8.9–10.3)
Chloride: 103 mmol/L (ref 98–111)
Chloride: 104 mmol/L (ref 98–111)
Creatinine, Ser: 0.67 mg/dL (ref 0.44–1.00)
Creatinine, Ser: 0.61 mg/dL (ref 0.44–1.00)
GFR, Estimated: >60 mL/min (ref >60)
GFR, Estimated: >60 mL/min (ref >60)
Glucose, Bld: 123 mg/dL — ABNORMAL HIGH (ref 70–99)
Glucose, Bld: 113 mg/dL — ABNORMAL HIGH (ref 70–99)
Potassium: 3.5 mmol/L (ref 3.5–5.1)
Potassium: 3.7 mmol/L (ref 3.5–5.1)
Sodium: 135 mmol/L (ref 135–145)
Sodium: 135 mmol/L (ref 135–145)
Total Bilirubin: 0.4 mg/dL (ref 0.3–1.2)
Total Bilirubin: 0.4 mg/dL (ref 0.3–1.2)
Total Protein: 7.5 g/dL (ref 6.5–8.1)
Total Protein: 7.7 g/dL (ref 6.5–8.1)

## 2022-04-20 LAB — DIFFERENTIAL
Abs Immature Granulocytes: 0.02 10*3/uL (ref 0.00–0.07)
Basophils Absolute: 0 10*3/uL (ref 0.0–0.1)
Basophils Relative: 1 %
Eosinophils Absolute: 0 10*3/uL (ref 0.0–0.5)
Eosinophils Relative: 1 %
Immature Granulocytes: 1 %
Lymphocytes Relative: 7 %
Lymphs Abs: 0.1 10*3/uL — ABNORMAL LOW (ref 0.7–4.0)
Monocytes Absolute: 0.2 10*3/uL (ref 0.1–1.0)
Monocytes Relative: 10 %
Neutro Abs: 1.2 10*3/uL — ABNORMAL LOW (ref 1.7–7.7)
Neutrophils Relative %: 80 %

## 2022-04-20 LAB — CBC WITH DIFFERENTIAL/PLATELET
Abs Immature Granulocytes: 0.01 10*3/uL (ref 0.00–0.07)
Basophils Absolute: 0 10*3/uL (ref 0.0–0.1)
Basophils Relative: 0 %
Eosinophils Absolute: 0 10*3/uL (ref 0.0–0.5)
Eosinophils Relative: 1 %
HCT: 22 % — ABNORMAL LOW (ref 36.0–46.0)
Hemoglobin: 7.3 g/dL — ABNORMAL LOW (ref 12.0–15.0)
Immature Granulocytes: 1 %
Lymphocytes Relative: 6 %
Lymphs Abs: 0.1 10*3/uL — ABNORMAL LOW (ref 0.7–4.0)
MCH: 27 pg (ref 26.0–34.0)
MCHC: 33.2 g/dL (ref 30.0–36.0)
MCV: 81.5 fL (ref 80.0–100.0)
Monocytes Absolute: 0.2 10*3/uL (ref 0.1–1.0)
Monocytes Relative: 9 %
Neutro Abs: 1.4 10*3/uL — ABNORMAL LOW (ref 1.7–7.7)
Neutrophils Relative %: 83 %
Platelets: 48 10*3/uL — ABNORMAL LOW (ref 150–400)
RBC: 2.7 MIL/uL — ABNORMAL LOW (ref 3.87–5.11)
Smear Review: NORMAL
WBC: 1.7 10*3/uL — ABNORMAL LOW (ref 4.0–10.5)
nRBC: 0 % (ref 0.0–0.2)

## 2022-04-20 LAB — RAD ONC ARIA SESSION SUMMARY
Course Elapsed Days: 42
Plan Fractions Treated to Date: 28
Plan Prescribed Dose Per Fraction: 1.8 Gy
Plan Total Fractions Prescribed: 28
Plan Total Prescribed Dose: 50.4 Gy
Reference Point Dosage Given to Date: 54 Gy
Reference Point Session Dosage Given: 1.8 Gy
Session Number: 30

## 2022-04-20 LAB — TROPONIN I (HIGH SENSITIVITY)
Troponin I (High Sensitivity): 2 ng/L (ref <18)
Troponin I (High Sensitivity): <2 ng/L (ref <18)

## 2022-04-20 LAB — CBC
HCT: 23.4 % — ABNORMAL LOW (ref 36.0–46.0)
Hemoglobin: 7.4 g/dL — ABNORMAL LOW (ref 12.0–15.0)
MCH: 26.8 pg (ref 26.0–34.0)
MCHC: 31.6 g/dL (ref 30.0–36.0)
MCV: 84.8 fL (ref 80.0–100.0)
Platelets: 54 10*3/uL — ABNORMAL LOW (ref 150–400)
RBC: 2.76 MIL/uL — ABNORMAL LOW (ref 3.87–5.11)
RDW: 34.5 % — ABNORMAL HIGH (ref 11.5–15.5)
WBC: 1.5 10*3/uL — ABNORMAL LOW (ref 4.0–10.5)
nRBC: 0 % (ref 0.0–0.2)

## 2022-04-20 LAB — LIPASE, BLOOD: Lipase: 23 U/L (ref 11–51)

## 2022-04-20 LAB — PREPARE RBC (CROSSMATCH)

## 2022-04-20 LAB — I-STAT BETA HCG BLOOD, ED (MC, WL, AP ONLY): I-stat hCG, quantitative: <5 m[IU]/mL (ref <5)

## 2022-04-20 MED ORDER — MORPHINE SULFATE (PF) 2 MG/ML IV SOLN
2.0000 mg | INTRAVENOUS | Status: DC | PRN
  Administered 2022-04-21 – 2022-04-25 (×10): 2 mg via INTRAVENOUS
  Filled 2022-04-20 (×12): qty 1

## 2022-04-20 MED ORDER — MORPHINE SULFATE (PF) 4 MG/ML IV SOLN
4.0000 mg | Freq: Once | INTRAVENOUS | Status: AC
  Administered 2022-04-20: 4 mg via INTRAVENOUS
  Filled 2022-04-20: qty 1

## 2022-04-20 MED ORDER — SODIUM CHLORIDE 0.9 % IV SOLN: 2.0000 g | Freq: Once | INTRAVENOUS | Status: DC

## 2022-04-20 MED ORDER — VANCOMYCIN HCL 2000 MG/400ML IV SOLN
2000.0000 mg | Freq: Once | INTRAVENOUS | Status: DC
  Filled 2022-04-20: qty 400

## 2022-04-20 MED ORDER — HYDROCODONE-ACETAMINOPHEN 5-325 MG PO TABS
1.0000 | ORAL_TABLET | Freq: Two times a day (BID) | ORAL | 0 refills | Status: DC | PRN
  Filled 2022-04-20: qty 45, 11d supply, fill #0

## 2022-04-20 MED ORDER — HEPARIN SOD (PORK) LOCK FLUSH 100 UNIT/ML IV SOLN
500.0000 [IU] | Freq: Once | INTRAVENOUS | Status: AC
  Administered 2022-04-20: 500 [IU]

## 2022-04-20 MED ORDER — AMOXICILLIN-POT CLAVULANATE 875-125 MG PO TABS
1.0000 | ORAL_TABLET | Freq: Two times a day (BID) | ORAL | 0 refills | Status: DC
  Filled 2022-04-20: qty 14, 7d supply, fill #0

## 2022-04-20 MED ORDER — SODIUM CHLORIDE 0.9% FLUSH
10.0000 mL | Freq: Once | INTRAVENOUS | Status: AC
  Administered 2022-04-20: 10 mL

## 2022-04-20 MED ORDER — SODIUM CHLORIDE 0.9% IV SOLUTION: Freq: Once | INTRAVENOUS | Status: AC

## 2022-04-20 NOTE — Progress Notes (Signed)
Verbal order from Dr. Burr Medico to remove pt's PICC.  Order entered into Epic.

## 2022-04-20 NOTE — Progress Notes (Signed)
PICC Removal Note:  PICC line removed from Right antecubital after sterile site prepped per protocol. PICC catheter tip visualized and intact. Pressure dressing applied with tegaderm and guaze pressure bandage. No redness, ecchymosis, edema, swelling, or drainage noted at site. Instructions provided on post PICC discharge care, including followup notification instructions.   Patient observed for 30 minutes following PICC removal. VSS at discharge, patient ambulatory to the lobby with family.

## 2022-04-20 NOTE — Assessment & Plan Note (Signed)
Rectal pain is her chief complaint.  This is improved - Contracted continue topical lidocaine - Increase laxatives - oxycodone PRN

## 2022-04-20 NOTE — Progress Notes (Signed)
A consult was received from an ED physician for vancomycin and cefepime per pharmacy dosing.  The patient's profile has been reviewed for ht/wt/allergies/indication/available labs.   A one time order has been placed for vanc 2gm and cefepime 2gm.    Further antibiotics/pharmacy consults should be ordered by admitting physician if indicated.                       Thank you, Dolly Rias RPh 04/20/2022, 10:31 PM

## 2022-04-20 NOTE — ED Notes (Signed)
Pt alert and oriented x4 . Pt experiencing tachypnea . Pt reports 10/10 pain in rectum. Pt reports that she is suffering from rectal cancer.

## 2022-04-20 NOTE — Progress Notes (Addendum)
[place]   [phone] [phone]   Clinic Follow up Note   Patient Care Team: Pcp, No as PCP - General [name], MD (Rheumatology) Royston Sinner Colin Benton, MD as Consulting Physician (Obstetrics and Gynecology) Truitt Merle, MD as Consulting Physician (Hematology and Oncology)  Date of Service:  04/20/2022  CHIEF COMPLAINT: f/u of anal cancer  CURRENT THERAPY:  Concurrent chemoRT, starting 03/09/22             -mitomycin on day 1, 28             -5FU on days 1-5, 28-31  ASSESSMENT & PLAN:  Tammy Garrison is a 31 y.o. female with   1. Anal Cancer, cT2N0Mx, with hypermetabolic left axillary nodes, HPV(+) -presented with worsening rectal bleeding, began passing clots and was admitted 02/06/22 for anemia from blood loss. S/p hemorrhoidectomy on 02/08/22 by Dr. 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 -PET scan 02/26/22 showed: intense uptake to anus; no signs of solid organ or FDG-avid nodal metastasis within abdomen or pelvis, hypermetabolic left axillary lymph nodes.  Sequently left axillary lymph node biopsy was benign. -she established care with Dr. Dema Severin on 03/03/22. -she began concurrent chemoRT with mitomycin/5FU on 03/09/22. She developed severe nausea with vomiting, as well as headaches. She was given IVF on three days last week and recovered well. -she completed radiation this morning. We will plan to remove her PICC line today. -labs reviewed, hgb 7.3, plt 48k, WBC 1.7 today. Will plan to give GCF-S on 9/21 with repeat lab, and we will see her back a week after.   2. ChemoRT Toxicities: Rectal irritation, Diarrhea -she has developed irritation from radiation. She is alternating norco and tylenol #3. -her diarrhea has improved to loose stool, which has also allowed her skin to heal.   3. Anemia, from vaginal bleeding and iron  deficiency -low hgb dating back to 04/2018, prior to that in 2017 was WNL. She has heavy periods, requiring pad/tampon change every 30 minutes. -she has required IV Feraheme and pRBC. -she is now on Megace and premarin, which is controlling her bleeding better.  4. Boil/abscess in right axilla  -she has a very tender boil under her right arm. I'm concerned for infection and recommended it be drained. For now, I will call in augmentin. -will refer her back to her surgeon if she needs I&D     PLAN:  -remove PICC line today -I called in augmentin and refilled norco -lab and GCF-S injection 9/21 -lab and f/u 9/27 or 9/28 -will check ferritin to see if she needs more iv iron  -I contacted her surgeon Dr. Dema Severin to see if he can see her back, and if she needs I&D  Addendum -I spoke with her surgeon Dr. Dema Severin. He is very concerned about her neutropenia and abscess, and recommend ED visit for iv antibiotics and possible admission. Our GI navigator Santiago Glad spoke with her, she agrees to go to ED. I will f/u if she gets admitted.   Truitt Merle   No problem-specific Assessment & Plan notes found for this encounter.   SUMMARY OF ONCOLOGIC HISTORY: Oncology History  Anal cancer ([place])  02/08/2022 Pathology Results   FINAL MICROSCOPIC DIAGNOSIS:   A. SOFT TISSUE, ANTERIOR MASS VS. HEMORRHOID, EXCISION:  Invasive squamous cell carcinoma, focally keratinizing and  well-differentiated.  Carcinoma is present at the margins of excision.  B. HEMORRHOID, INTERNAL, EXCISION:  Hemorrhoid without the overlying epithelium.  Separate fragment of benign squamous epithelium.  Negative for neoplasm.   ADDENDUM:  P16 immunostain is strongly positive suggestive of this lesion being the HPV related.    02/08/2022 Cancer Staging   Staging form: Anus, AJCC V9 - Clinical stage from 02/08/2022: Stage IIA (cT2, cN0, cM0) - Signed by Truitt Merle, MD on 02/23/2022 Stage prefix: Initial diagnosis   02/23/2022 Initial  Diagnosis   Anal cancer ([place])   03/09/2022 - 03/13/2022 Chemotherapy   Patient is on Treatment Plan : ANUS Mitomycin D1,28 / 5FU D1-4, 28-31 q32d     03/09/2022 -  Chemotherapy   Patient is on Treatment Plan : ANUS Mitomycin D1,28 + 5FU D1-4, 28-31 q32d        INTERVAL HISTORY:  Tammy Garrison is here for a follow up of anal cancer. She was last seen by me on 04/13/22. She presents to the clinic alone. She reports she is feeling better on the premarin, and her bleeding is mostly stopped.  She reports she is still having rectal irritation, but her diarrhea has improved to loose stool. Her skin has also improved with the improvement of diarrhea. She also reports a very sensitive boil under her right arm (same side as PICC).    All other systems were reviewed with the patient and are negative.  MEDICAL HISTORY:  Past Medical History:  Diagnosis Date   Bipolar 1 disorder ([place])    ETOH abuse    H/O self-harm    H/O suicide attempt    x 5 - last 05/2016 - overdose Ibuprofen   Marijuana abuse    Rheumatoid arthritis ([place])     SURGICAL HISTORY: Past Surgical History:  Procedure Laterality Date   HEMORRHOID SURGERY N/A 02/08/2022   Procedure: EXTERNAL AND INTERNAL HEMORRHOIDECTOMY;  Surgeon: Georganna Skeans, MD;  Location: [facility];  Service: General;  Laterality: N/A;    I have reviewed the social history and family history with the patient and they are unchanged from previous note.  ALLERGIES:  is allergic to coconut flavor.  MEDICATIONS:  Current Outpatient Medications  Medication Sig Dispense Refill   amoxicillin-clavulanate (AUGMENTIN) 875-125 MG tablet Take 1 tablet by mouth 2 (two) times daily. 14 tablet 0   acetaminophen-codeine (TYLENOL #3) 300-30 MG tablet Take 1 tablet by mouth every 6 hours as needed for moderate pain. 30 tablet 0   dexamethasone (DECADRON) 4 MG tablet Take 1 tablet (4 mg total) by mouth daily. Take daily for up to 5 days after chemo. 30 tablet 0    estrogens, conjugated, (PREMARIN) 1.25 MG tablet Take 1 tablet (1.25 mg total) by mouth 2 (two) times daily. 14 tablet 0   HYDROcodone-acetaminophen (NORCO/VICODIN) 5-325 MG tablet Take 1-2 tablets by mouth every 12 (twelve) hours as needed for moderate pain. 45 tablet 0   LORazepam (ATIVAN) 0.5 MG tablet Take 1 tablet (0.5 mg total) by mouth every 6 (six) hours as needed (nausea). 20 tablet 0   megestrol (MEGACE) 40 MG tablet Take 1 tablet (40 mg total) by mouth 2 (two) times daily. 28 tablet 0   ondansetron (ZOFRAN-ODT) 8 MG disintegrating tablet Dissolve 1 tablet (8 mg total) by mouth every 8 (eight) hours as needed for nausea or vomiting. 20 tablet 1   promethazine (PHENERGAN) 25 MG tablet Take 1 tablet (25 mg total) by mouth every 6 (six) hours as needed for nausea or vomiting. 30 tablet 1   No current facility-administered  medications for this visit.   Facility-Administered Medications Ordered in Other Visits  Medication Dose Route Frequency Provider Last Rate Last Admin   0.9 %  sodium chloride infusion (Manually program via Guardrails IV Fluids)  250 mL Intravenous Once Truitt Merle, MD       heparin lock flush 100 unit/mL  250 Units Intracatheter Once Truitt Merle, MD       sodium chloride flush (NS) 0.9 % injection 10 mL  10 mL Intracatheter Once Truitt Merle, MD       sodium chloride flush (NS) 0.9 % injection 10 mL  10 mL Intracatheter Once Truitt Merle, MD       sodium chloride flush (NS) 0.9 % injection 10 mL  10 mL Intracatheter Once Truitt Merle, MD        PHYSICAL EXAMINATION: ECOG PERFORMANCE STATUS: 1 - Symptomatic but completely ambulatory  Vitals:   04/20/22 0954  Pulse: (!) 118   Wt Readings from Last 3 Encounters:  04/20/22 262 lb 11.2 oz (119.2 kg)  04/15/22 260 lb 8 oz (118.2 kg)  04/13/22 269 lb 4.8 oz (122.2 kg)     GENERAL:alert, no distress and comfortable SKIN: skin color normal, no rashes, (+) very tender boil under right arm EYES: normal, Conjunctiva are pink and  non-injected, sclera clear  NEURO: alert & oriented x 3 with fluent speech  LABORATORY DATA:  I have reviewed the data as listed    Latest Ref Rng & Units 04/20/2022    9:32 AM 04/15/2022    9:27 AM 04/13/2022    9:13 AM  CBC  WBC 4.0 - 10.5 K/uL 1.7  4.4  5.2   Hemoglobin 12.0 - 15.0 g/dL 7.3  8.1  8.4   Hematocrit 36.0 - 46.0 % 22.0  25.1  25.9   Platelets 150 - 400 K/uL 48  71  41         Latest Ref Rng & Units 04/20/2022    9:32 AM 04/13/2022    9:13 AM 04/10/2022   10:29 AM  CMP  Glucose 70 - 99 mg/dL 123  134  115   BUN 6 - 20 mg/dL '9  12  11   '$ Creatinine 0.44 - 1.00 mg/dL 0.67  0.71  0.74   Sodium 135 - 145 mmol/L 135  136  133   Potassium 3.5 - 5.1 mmol/L 3.5  3.8  4.2   Chloride 98 - 111 mmol/L 103  105  101   CO2 22 - 32 mmol/L '21  26  25   '$ Calcium 8.9 - 10.3 mg/dL 9.3  9.3  9.8   Total Protein 6.5 - 8.1 g/dL 7.5  7.5  8.6   Total Bilirubin 0.3 - 1.2 mg/dL 0.4  0.3  0.4   Alkaline Phos 38 - 126 U/L 74  60  74   AST 15 - 41 U/L '16  12  13   '$ ALT 0 - 44 U/L '23  18  29       '$ RADIOGRAPHIC STUDIES: I have personally reviewed the radiological images as listed and agreed with the findings in the report. No results found.    Orders Placed This Encounter  Procedures   Ferritin    Standing Status:   Future    Standing Expiration Date:   04/21/2023   All questions were answered. The patient knows to call the clinic with any problems, questions or concerns. No barriers to learning was detected. The total time spent in the appointment was  30 minutes.     Truitt Merle, MD 04/20/2022   I, Wilburn Mylar, am acting as scribe for Truitt Merle, MD.   I have reviewed the above documentation for accuracy and completeness, and I agree with the above.

## 2022-04-20 NOTE — Patient Instructions (Signed)
PICC Removal, Adult, Care After The following information offers guidance on how to care for yourself after your procedure. Your health care provider may also give you more specific instructions. If you have problems or questions, contact your health care provider. What can I expect after the procedure? After the procedure, it is common to have: Tenderness or soreness. Redness, swelling, or a scab at the place where your PICC was removed (exit site). Follow these instructions at home: For the first 24 hours after the procedure: Keep the bandage (dressing) on your exit site clean and dry. Do not remove your dressing until your health care provider tells you to do so. Do not lift anything heavy or do activities that require great effort until your health care provider says it is okay. You should avoid: Lifting weights. Doing yard work. Doing any physical activity with repetitive arm movement. Watch closely for any signs of an air bubble in the vein (air embolism). This is a rare but serious complication. Signs of an air embolism include trouble breathing, wheezing, chest pain, or a fast pulse. If you have signs of an air embolism, call 911 right away and lie down on your left side to keep the air from moving into your lungs. After 24 hours have passed:  Remove your dressing as told by your health care provider. Wash your hands with soap and water for at least 20 seconds before and after you change your dressing. If soap and water are not available, use hand sanitizer. Return to your normal activities as told by your health care provider. A small scab may develop over the exit site. Do not pick at the scab. When bathing or showering, gently wash the exit site with soap and water. Pat it dry. Watch for signs of infection, such as: A fever or chills. Swollen glands under your arm. More redness, swelling, or soreness around your arm. Blood, fluid, or pus coming from your exit site. Warmth or a  bad smell coming from your exit site. A red streak spreading away from your exit site. General instructions Take over-the-counter and prescription medicines only as told by your health care provider. Do not take any new medicines without checking with your health care provider first. If you were given an antibiotic ointment, apply it as told by your health care provider. Keep all follow-up visits. This is important. Contact a health care provider if: You have a fever or chills. You have swelling at your exit site or swollen glands under your arm. You have signs of infection at your exit site. You have soreness, redness, or swelling in your arm that gets worse. Get help right away if: You have numbness or tingling in your fingers, hand, or arm. Your arm looks blue and feels cold. You have signs of an air embolism, such as trouble breathing, wheezing, chest pain, or a fast pulse. These symptoms may be an emergency. Get medical help right away. Call 911. Do not wait to see if the symptoms will go away. Do not drive yourself to the hospital. Summary After a PICC is removed, it is common to have tenderness or soreness, redness, swelling, or a scab at the exit site. Keep the bandage (dressing) over the exit site clean and dry. Do not remove the dressing until your health care provider tells you to do so. Do not lift anything heavy or do activities that require great effort until your health care provider says it is okay. Watch closely for any signs   of an air bubble (air embolism). If you have signs of an air embolism, call 911 right away and lie down on your left side. This information is not intended to replace advice given to you by your health care provider. Make sure you discuss any questions you have with your health care provider. Document Revised: 02/05/2021 Document Reviewed: 02/05/2021 Elsevier Patient Education  2023 Elsevier Inc.  

## 2022-04-20 NOTE — Progress Notes (Signed)
I spoke with Janiylah and directed her to [facility] ED er Dr Burr Medico and Dr Orest Dikes request for treatment of the possible infection in her right axilla.  I advised her to take her "chemo card" with her.  She denies fever and rigors.  She is dizzy when she gets up.  I instructed her to get up slowly and before walking to make sure she is not dizzy.  She states she will go to the ED after her Mom gets home from work and can provide transportation.  All questions were answered.  She verbalized understanding.

## 2022-04-20 NOTE — H&P (Signed)
History and Physical    Tammy Garrison ZDG:[id number] DOB: 05-Feb-1991 DOA: 04/20/2022  PCP: Pcp, No  Patient coming from: Home  I have personally briefly reviewed patient's old medical records in [place]  Chief Complaint: Right axillary abscess  HPI: Tammy Garrison is a 31 y.o. female with medical history significant for squamous cell anal cancer on active chemoradiation, anemia due to iron deficiency and menorrhagia/fibroid uterus who presented to the ED for evaluation of right axillary abscess.  Patient presented to the ED at the advice of her oncologist for evaluation of a right axillary abscess.  She first noticed swelling and tenderness to the area about 3 days ago. She has not seen any discharge from the abscess. She had blood work in the oncology office earlier today which showed worsening pancytopenia with WBC 1.7, ANC 1400, hemoglobin 7.3, platelets 48,000.  She completed her radiation treatment the same morning.  Her oncologist spoke with patient's general surgeon, Dr. Dema Severin, who recommended admission and potential I&D given the neutropenia.  Patient reports ongoing vaginal bleeding although appears that this is slowing down since she was started on Megace.  She reports continued anal pain especially with bowel movements.  She has been having generalized abdominal discomfort.  She reports occasional nausea which improves with use of Compazine.  ED Course  Labs/Imaging on admission: I have personally reviewed following labs and imaging studies.  Initial vitals showed BP 116/62, pulse 129, RR 18, temp 98.3 F, SPO2 100% on room air.  Orthostatic vitals were positive with SBP 145 on lying dropping to 115 on standing.  Labs showed WBC 1.5, ANC 1200, hemoglobin 7.4, platelets 54,000, sodium 135, potassium 3.7, bicarb 23, BUN 10, creatinine 0.61, serum glucose 113, LFTs within normal limits, lipase 23, troponin <2, i-STAT beta-hCG <5.0.  Blood cultures ordered  and pending.  2 view chest x-ray negative for focal consolidation, edema, effusion.  Patient was given 4 mg IV morphine.  EDP discussed with on-call oncology, Dr. Chryl Heck, who recommended 1 unit PRBC transfusion, broad-spectrum antibiotics, medical admission, and general surgery consult in a.m. patient was started on IV vancomycin and cefepime.  The hospitalist service was consulted to admit for further evaluation and management.  Review of Systems: All systems reviewed and are negative except as documented in history of present illness above.   Past Medical History:  Diagnosis Date   Bipolar 1 disorder ([place])    ETOH abuse    H/O self-harm    H/O suicide attempt    x 5 - last 05/2016 - overdose Ibuprofen   Marijuana abuse    Rheumatoid arthritis ([place])     Past Surgical History:  Procedure Laterality Date   HEMORRHOID SURGERY N/A 02/08/2022   Procedure: EXTERNAL AND INTERNAL HEMORRHOIDECTOMY;  Surgeon: Georganna Skeans, MD;  Location: [facility];  Service: General;  Laterality: N/A;    Social History:  reports that she quit smoking about 3 years ago. Her smoking use included cigarettes. She has a 1.75 pack-year smoking history. She has never used smokeless tobacco. She reports that she does not currently use alcohol. She reports that she does not currently use drugs after having used the following drugs: Marijuana.  Allergies  Allergen Reactions   Coconut Flavor Itching and Rash    Itchy throat, rash,itching     Family History  Problem Relation Age of Onset   Hypertension Other    Lupus Mother      Prior to Admission medications   Medication Sig Start  Date End Date Taking? Authorizing Provider  acetaminophen-codeine (TYLENOL #3) 300-30 MG tablet Take 1 tablet by mouth every 6 hours as needed for moderate pain. 04/07/22   Truitt Merle, MD  amoxicillin-clavulanate (AUGMENTIN) 875-125 MG tablet Take 1 tablet by mouth 2 (two) times daily. 04/20/22   Truitt Merle, MD  dexamethasone (DECADRON) 4  MG tablet Take 1 tablet (4 mg total) by mouth daily. Take daily for up to 5 days after chemo. 03/13/22   Truitt Merle, MD  estrogens, conjugated, (PREMARIN) 1.25 MG tablet Take 1 tablet (1.25 mg total) by mouth 2 (two) times daily. 04/10/22   Chancy Milroy, MD  HYDROcodone-acetaminophen (NORCO/VICODIN) 5-325 MG tablet Take 1-2 tablets by mouth every 12 (twelve) hours as needed for moderate pain. 04/20/22   Truitt Merle, MD  LORazepam (ATIVAN) 0.5 MG tablet Take 1 tablet (0.5 mg total) by mouth every 6 (six) hours as needed (nausea). 04/10/22   Truitt Merle, MD  megestrol (MEGACE) 40 MG tablet Take 1 tablet (40 mg total) by mouth 2 (two) times daily. 04/01/22   Marcello Fennel, PA-C  ondansetron (ZOFRAN-ODT) 8 MG disintegrating tablet Dissolve 1 tablet (8 mg total) by mouth every 8 (eight) hours as needed for nausea or vomiting. 03/13/22   Truitt Merle, MD  promethazine (PHENERGAN) 25 MG tablet Take 1 tablet (25 mg total) by mouth every 6 (six) hours as needed for nausea or vomiting. 03/13/22   Truitt Merle, MD  prochlorperazine (COMPAZINE) 10 MG tablet Take 1 tablet (10 mg total) by mouth every 6 (six) hours as needed (Nausea or vomiting). 03/09/22 03/23/22  Truitt Merle, MD    Physical Exam: Vitals:   04/20/22 2030 04/20/22 2100 04/20/22 2215 04/21/22 0026  BP: (!) 138/96 (!) 138/93 136/80 120/70  Pulse: (!) 122 (!) 116 (!) 114 (!) 109  Resp: '17 20 18 18  '$ Temp:    99.1 F (37.3 C)  TempSrc:    Oral  SpO2: 100% 100% 99% 100%  Weight:      Height:       Constitutional: Resting in bed with head elevated, appears fatigued but in NAD, calm Eyes: EOMI, lids and conjunctivae normal ENMT: Mucous membranes are moist. Posterior pharynx clear of any exudate or lesions.Normal dentition.  Neck: normal, supple, no masses. Respiratory: Normal respiratory effort. No accessory muscle use.  Cardiovascular: Regular rate and rhythm, no murmurs / rubs / gallops. No extremity edema. Abdomen: Generalized tenderness to deep  palpation. Musculoskeletal: no clubbing / cyanosis. No joint deformity upper and lower extremities. Good ROM, no contractures. Normal muscle tone.  Skin: Approximately 2 cm right axillary fluctuant area with white punctate tender to very light palpation Neurologic: Sensation intact. Strength 5/5 in all 4.  Psychiatric: Normal judgment and insight. Alert and oriented x 3. Normal mood.   EKG: Personally reviewed. Sinus tachycardia, rate 132, rate is faster when compared to prior.  Assessment/Plan Principal Problem:   Abscess of right axilla Active Problems:   Pancytopenia ([place])   Anal squamous cell carcinoma ([facility])   Lucky Latrece Branscome is a 31 y.o. female with medical history significant for squamous cell anal cancer on active chemoradiation, anemia due to iron deficiency and menorrhagia/fibroid uterus who is admitted with right axillary abscess in setting of neutropenia with worsening anemia and thrombocytopenia.  Assessment and Plan: * Abscess of right axilla -Start on IV vancomycin and cefepime -Follow blood cultures -Analgesics as needed -General surgery consult  Pancytopenia ([facility]) New neutropenia with ANC 1200.  Platelets 54,000.  Hemoglobin trending down last 2 weeks from 10 >> 7.4 in setting of chemotherapy and ongoing uterine bleeding. -Transfuse 1 unit PRBC -Repeat CBC with differential in a.m. -May need platelet transfusion  Anal squamous cell carcinoma ([place]) Follows with oncology, Dr. Burr Medico.  On active chemotherapy with mitomycin/5-FU.  Completed radiation therapy on 9/18.  DVT prophylaxis: SCDs Start: 04/21/22 0007 Code Status: Full code Family Communication: Discussed with patient, she has discussed with family Disposition Plan: From home and likely discharge to home pending clinical progress Consults called: EDP discussed with oncology, secure chat sent to general surgery opt-in group for a.m. consultation Severity of Illness: The appropriate patient status for  this patient is INPATIENT. Inpatient status is judged to be reasonable and necessary in order to provide the required intensity of service to ensure the patient's safety. The patient's presenting symptoms, physical exam findings, and initial radiographic and laboratory data in the context of their chronic comorbidities is felt to place them at high risk for further clinical deterioration. Furthermore, it is not anticipated that the patient will be medically stable for discharge from the hospital within 2 midnights of admission.   * I certify that at the point of admission it is my clinical judgment that the patient will require inpatient hospital care spanning beyond 2 midnights from the point of admission due to high intensity of service, high risk for further deterioration and high frequency of surveillance required.Zada Finders MD [facility]  If 7PM-7AM, please contact night-coverage [email or web address]  04/21/2022, 12:30 AM

## 2022-04-20 NOTE — ED Triage Notes (Signed)
Pt was sent by her Dr. Her hgb was 7.3 and platelets were 43. She reports severe rectal pain and passing large blood clots in her stool.Also reports SOB. Tachycardic.

## 2022-04-20 NOTE — ED Provider Notes (Signed)
[facility] DEPT Provider Note   CSN: [id number] Arrival date & time: 04/20/22  1914     History  Chief Complaint  Patient presents with   Abnormal Labs   HPI Tammy Garrison is a 31 y.o. female with squamous cell carcinoma of the anus, uterine fibroids, iron deficient anemia related to blood loss presenting for abnormal labs.  Was seen by oncology today and was noted to have a hemoglobin of 7.3 and platelets of 43.  For this reason she was advised to present to the ED for further evaluation.  Patient also stated that this morning she was bowel movement she filled the toilet with bright red blood.  She says that it has happened in the past when she started radiation.  She says it is odd because she is ending her radiation today.  Also stated that in the last 20 days she has had heavy vaginal bleeding.  She endorses lightheadedness, weakness, shortness of breath but no chest pain.  She attributes this to new diagnosis of uterine fibroids.  She also mentioned that she has a boil under her right armpit.  It is not oozing or bleeding but is exquisitely painful.  She is actively receiving radiation for anal cancer. Stated that today is last day of radiation.       Home Medications Prior to Admission medications   Medication Sig Start Date End Date Taking? Authorizing Provider  acetaminophen-codeine (TYLENOL #3) 300-30 MG tablet Take 1 tablet by mouth every 6 hours as needed for moderate pain. 04/07/22   Truitt Merle, MD  amoxicillin-clavulanate (AUGMENTIN) 875-125 MG tablet Take 1 tablet by mouth 2 (two) times daily. 04/20/22   Truitt Merle, MD  dexamethasone (DECADRON) 4 MG tablet Take 1 tablet (4 mg total) by mouth daily. Take daily for up to 5 days after chemo. 03/13/22   Truitt Merle, MD  estrogens, conjugated, (PREMARIN) 1.25 MG tablet Take 1 tablet (1.25 mg total) by mouth 2 (two) times daily. 04/10/22   Chancy Milroy, MD  HYDROcodone-acetaminophen  (NORCO/VICODIN) 5-325 MG tablet Take 1-2 tablets by mouth every 12 (twelve) hours as needed for moderate pain. 04/20/22   Truitt Merle, MD  LORazepam (ATIVAN) 0.5 MG tablet Take 1 tablet (0.5 mg total) by mouth every 6 (six) hours as needed (nausea). 04/10/22   Truitt Merle, MD  megestrol (MEGACE) 40 MG tablet Take 1 tablet (40 mg total) by mouth 2 (two) times daily. 04/01/22   Marcello Fennel, PA-C  ondansetron (ZOFRAN-ODT) 8 MG disintegrating tablet Dissolve 1 tablet (8 mg total) by mouth every 8 (eight) hours as needed for nausea or vomiting. 03/13/22   Truitt Merle, MD  promethazine (PHENERGAN) 25 MG tablet Take 1 tablet (25 mg total) by mouth every 6 (six) hours as needed for nausea or vomiting. 03/13/22   Truitt Merle, MD  prochlorperazine (COMPAZINE) 10 MG tablet Take 1 tablet (10 mg total) by mouth every 6 (six) hours as needed (Nausea or vomiting). 03/09/22 03/23/22  Truitt Merle, MD      Allergies    Coconut flavor    Review of Systems   Review of Systems  Skin:  Positive for wound.    Physical Exam Updated Vital Signs BP 136/80   Pulse (!) 114   Temp 98.3 F (36.8 C) (Oral)   Resp 18   Ht 6' (1.829 m)   Wt 118.8 kg   LMP 03/29/2022   SpO2 99%   BMI 35.53 kg/m  Physical Exam  Vitals and nursing note reviewed.  Constitutional:      General: She is not in acute distress.    Appearance: She is ill-appearing.  HENT:     Head: Normocephalic and atraumatic.     Mouth/Throat:     Mouth: Mucous membranes are moist.  Eyes:     General:        Right eye: No discharge.        Left eye: No discharge.     Conjunctiva/sclera: Conjunctivae normal.  Cardiovascular:     Rate and Rhythm: Regular rhythm. Tachycardia present.     Pulses: Normal pulses.     Heart sounds: Normal heart sounds.  Pulmonary:     Effort: Pulmonary effort is normal.     Breath sounds: Normal breath sounds.  Abdominal:     General: Abdomen is flat.     Palpations: Abdomen is soft.  Skin:    General: Skin is warm and  dry.     Comments: Lesion with white punctate, fluctuant base approximately 2 cm in diameter and surrounding erythema and edema located at the right axilla.  Neurological:     General: No focal deficit present.     Mental Status: She is alert.  Psychiatric:        Mood and Affect: Mood normal.     ED Results / Procedures / Treatments   Labs (all labs ordered are listed, but only abnormal results are displayed) Labs Reviewed  CBC - Abnormal; Notable for the following components:      Result Value   WBC 1.5 (*)    RBC 2.76 (*)    Hemoglobin 7.4 (*)    HCT 23.4 (*)    RDW 34.5 (*)    Platelets 54 (*)    All other components within normal limits  COMPREHENSIVE METABOLIC PANEL - Abnormal; Notable for the following components:   Glucose, Bld 113 (*)    Albumin 3.4 (*)    All other components within normal limits  DIFFERENTIAL - Abnormal; Notable for the following components:   Neutro Abs 1.2 (*)    Lymphs Abs 0.1 (*)    All other components within normal limits  CULTURE, BLOOD (ROUTINE X 2)  CULTURE, BLOOD (ROUTINE X 2)  LIPASE, BLOOD  PROTIME-INR  I-STAT BETA HCG BLOOD, ED ([facility], [facility], [facility] ONLY)  TYPE AND SCREEN  PREPARE RBC (CROSSMATCH)  TROPONIN I (HIGH SENSITIVITY)  TROPONIN I (HIGH SENSITIVITY)    EKG EKG Interpretation  Date/Time:  Monday April 20 2022 19:26:06 EDT Ventricular Rate:  132 PR Interval:  136 QRS Duration: 127 QT Interval:  356 QTC Calculation: 528 R Axis:   45 Text Interpretation: Sinus tachycardia Atrial premature complex Nonspecific intraventricular conduction delay Borderline repolarization abnormality Confirmed by Octaviano Glow [phone]) on 04/20/2022 10:26:40 PM  Radiology DG Chest 2 View  Result Date: 04/20/2022 CLINICAL DATA:  Rectal bleeding, tachycardia, rectal cancer EXAM: CHEST - 2 VIEW COMPARISON:  08/14/2020 FINDINGS: Frontal and lateral views of the chest demonstrate an unremarkable cardiac silhouette. No airspace disease, effusion,  or pneumothorax. No acute bony abnormalities. IMPRESSION: 1. No acute intrathoracic process. Electronically Signed   By: Randa Ngo M.D.   On: 04/20/2022 19:58    Procedures Procedures    Medications Ordered in ED Medications  0.9 %  sodium chloride infusion (Manually program via Guardrails IV Fluids) (has no administration in time range)  vancomycin (VANCOREADY) IVPB 2000 mg/400 mL (has no administration in time range)  ceFEPIme (MAXIPIME) 2 g  in sodium chloride 0.9 % 100 mL IVPB (has no administration in time range)  morphine (PF) 4 MG/ML injection 4 mg (4 mg Intravenous Given 04/20/22 2200)    ED Course/ Medical Decision Making/ A&P Clinical Course as of 04/20/22 2248  Mon Apr 20, 2022  2226 This is a 31 year old female with a history of 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  Patient was found to be neutropenic on her lab work in the office today.  Her hemoglobin is chronically low but seems to be lower than normal, 7.6.  On my exam the patient is complaining of significant pain in her rectum.  She has lymphadenopathy.  She has an abscess under the left axilla.  She is pancytopenic.  She is also tachycardic.  The case was discussed with the PA provider and on-call oncologist Dr Chryl Heck, who advised 1 unit blood transfusion, broad-spectrum antibiotics, medical admission.  Oncology service will consult on the patient in the hospital.  Patient was also seen by general surgery for recurring abscesses.  The patient was verbally consented for blood transfusion at the bedside and in agreement.  This is been ordered [MT]    Clinical Course User Index [MT] Trifan, Carola Rhine, MD                           Medical Decision Making Amount and/or Complexity of Data Reviewed Labs:  ordered. Radiology: ordered.   This patient presents to the ED for concern of anemia, thrombocytopenia and skin abscess, this involves a number of treatment options, and is a complaint that carries with it a high risk of complications and morbidity.  The differential diagnosis includes iron deficiency anemia related to blood loss, lower GI bleed related to active anal cancer, vaginal bleeding related to uterine fibroids and right axillary abscess.   Co morbidities: Discussed in HPI   EMR reviewed including pt PMHx, past surgical history and past visits to ER.   See HPI for more details   Lab Tests:   I ordered and independently interpreted labs. Labs notable for pancytopenia, neutropenia, normal cytopenia and hyperglycemia   Imaging Studies:  NAD. I personally reviewed all imaging studies and no acute abnormality found. I agree with radiology interpretation.    Cardiac Monitoring:  The patient was maintained on a cardiac monitor.  I personally viewed and interpreted the cardiac monitored which showed an underlying rhythm of: Sinus tachycardia EKG non-ischemic   Medicines ordered:  I ordered medication including morphine for pain.  Vancomycin and cefepime for abscess in setting of neutropenia.  PRBCs for symptomatic anemia. Reevaluation of the patient after these medicines showed that the patient stayed the same signed out patient to attending. I have reviewed the patients home medicines and have made adjustments as needed   Consults/Attending Physician   I requested consultation with Dr. Chryl Heck,  and discussed lab and imaging findings as well as pertinent plan - they recommend: IV antibiotics for abscess in setting of neutropenia, PRBCs for anemia and admission to the hospital   Reevaluation:  After the interventions noted above I re-evaluated patient and found that they have : Signed out patient to ED physician.   Problem List / ED Course: Patient presented for  pancytopenia including thrombocytopenia and neutropenia in setting of ongoing radiation  for squamous cell carcinoma of the anus.  On exam patient was tachycardic and mildly tachypneic but maintaining O2 sats and generous BP.  Also noted right axilla abscess.  Reviewed recent oncology note from today's visit which expressed concern for possible infection related to abscess in the setting of neutropenia.  Dr. Dema Severin, her surgeon advised her to be evaluated in the ED for possible admission.  I reached out to Dr. Chryl Heck who advised to start IV antibiotics, PRBCs for anemia and admitted to the hospital for ongoing management.  She also said appropriate not to treat thrombocytopenia at this time.  Signed out patient to ED physician.    Dispostion:  No disposition at this time.  And out to oncoming ED physician.  Plan will be to admit to the hospital for ongoing IV antibiotic for empiric treatment of possible infection related to abscess in setting of neutropenia.         Final Clinical Impression(s) / ED Diagnoses Final diagnoses:  Anemia, unspecified type  Neutropenia, unspecified type ([place])  Thrombocytopenia ([place])  Shortness of breath    Rx / DC Orders ED Discharge Orders     None         Harriet Pho, PA-C 04/20/22 2249    Wyvonnia Dusky, MD 04/20/22 [phone]

## 2022-04-20 NOTE — ED Notes (Signed)
During ortho VS, pt stated that the only pain or discomfort she felt was due to the pain she initially walked in with, while she was laying and sitting. Pt during the standing portion experienced light-headed, dizzy, shob, and nausea. SpO2 levels stayed between 97-100 for the sitting and standing portion, and SpO2 levels stayed between 90-98 for the standing portion.

## 2022-04-20 NOTE — ED Provider Triage Note (Addendum)
Emergency Medicine Provider Triage Evaluation Note  Tammy Garrison , a 31 y.o. female  was evaluated in triage.  Pt complains of worsening rectal bleeding since early this morning.  Chronically has bleeding from the rectum due to chemo/radiation over the last few months, however states a significant increase this morning and was passing "clots".  Denies fevers or chills.  Recent Hgb of 7.3 and platelets of 43.  With mild shortness of breath and lightheadedness with movement.  Currently being treated for anal cancer.  Took a dose of Compazine few hours ago to help with her nausea, however no vomiting or diarrhea at this time.  PICC line was removed today.  Patient has received multiple blood transfusions in the past without complication.  Review of Systems  Positive:  Negative: See above  Physical Exam  BP (!) 143/99 (BP Location: Left Arm)   Pulse (!) 142   Temp 98.3 F (36.8 C) (Oral)   Resp 20   Ht 6' (1.829 m)   Wt 118.8 kg   LMP 03/29/2022   SpO2 100%   BMI 35.53 kg/m  Gen:   Awake, no distress   Resp:  Normal effort, CTAB MSK:   Moves extremities without difficulty  Other:  Abdomen soft, non-TTP  Medical Decision Making  Medically screening exam initiated at 7:41 PM.  Appropriate orders placed.  Breiana Latrece Norlander was informed that the remainder of the evaluation will be completed by another provider, this initial triage assessment does not replace that evaluation, and the importance of remaining in the ED until their evaluation is complete.     Prince Rome, PA-C 01/65/53 [id number]    Prince Rome, PA-C 70/78/67 [id number]

## 2022-04-21 LAB — CBC WITH DIFFERENTIAL/PLATELET
Abs Immature Granulocytes: 0.01 10*3/uL (ref 0.00–0.07)
Basophils Absolute: 0 10*3/uL (ref 0.0–0.1)
Basophils Relative: 0 %
Eosinophils Absolute: 0 10*3/uL (ref 0.0–0.5)
Eosinophils Relative: 1 %
HCT: 21.9 % — ABNORMAL LOW (ref 36.0–46.0)
Hemoglobin: 6.8 g/dL — CL (ref 12.0–15.0)
Immature Granulocytes: 1 %
Lymphocytes Relative: 10 %
Lymphs Abs: 0.1 10*3/uL — ABNORMAL LOW (ref 0.7–4.0)
MCH: 26.5 pg (ref 26.0–34.0)
MCHC: 31.1 g/dL (ref 30.0–36.0)
MCV: 85.2 fL (ref 80.0–100.0)
Monocytes Absolute: 0.2 10*3/uL (ref 0.1–1.0)
Monocytes Relative: 13 %
Neutro Abs: 1 10*3/uL — ABNORMAL LOW (ref 1.7–7.7)
Neutrophils Relative %: 75 %
Platelets: 51 10*3/uL — ABNORMAL LOW (ref 150–400)
RBC: 2.57 MIL/uL — ABNORMAL LOW (ref 3.87–5.11)
WBC: 1.4 10*3/uL — CL (ref 4.0–10.5)
nRBC: 0 % (ref 0.0–0.2)

## 2022-04-21 LAB — BASIC METABOLIC PANEL
Anion gap: 10 (ref 5–15)
BUN: 8 mg/dL (ref 6–20)
CO2: 21 mmol/L — ABNORMAL LOW (ref 22–32)
Calcium: 9.2 mg/dL (ref 8.9–10.3)
Chloride: 107 mmol/L (ref 98–111)
Creatinine, Ser: 0.7 mg/dL (ref 0.44–1.00)
GFR, Estimated: >60 mL/min (ref >60)
Glucose, Bld: 99 mg/dL (ref 70–99)
Potassium: 3.8 mmol/L (ref 3.5–5.1)
Sodium: 138 mmol/L (ref 135–145)

## 2022-04-21 LAB — HEMOGLOBIN AND HEMATOCRIT, BLOOD
HCT: 25.2 % — ABNORMAL LOW (ref 36.0–46.0)
Hemoglobin: 8.1 g/dL — ABNORMAL LOW (ref 12.0–15.0)

## 2022-04-21 LAB — GLUCOSE, CAPILLARY: Glucose-Capillary: 115 mg/dL — ABNORMAL HIGH (ref 70–99)

## 2022-04-21 LAB — PREPARE RBC (CROSSMATCH)

## 2022-04-21 MED ORDER — ONDANSETRON HCL 4 MG/2ML IJ SOLN: 4.0000 mg | Freq: Four times a day (QID) | INTRAMUSCULAR | Status: DC | PRN

## 2022-04-21 MED ORDER — ACETAMINOPHEN 650 MG RE SUPP: 650.0000 mg | Freq: Four times a day (QID) | RECTAL | Status: DC | PRN

## 2022-04-21 MED ORDER — SODIUM CHLORIDE 0.9 % IV SOLN
2.0000 g | Freq: Three times a day (TID) | INTRAVENOUS | Status: DC
  Administered 2022-04-21 – 2022-04-24 (×11): 2 g via INTRAVENOUS
  Filled 2022-04-21 (×11): qty 12.5

## 2022-04-21 MED ORDER — VANCOMYCIN HCL 1500 MG/300ML IV SOLN
1500.0000 mg | Freq: Two times a day (BID) | INTRAVENOUS | Status: DC
  Administered 2022-04-21 – 2022-04-23 (×4): 1500 mg via INTRAVENOUS
  Filled 2022-04-21 (×5): qty 300

## 2022-04-21 MED ORDER — ACETAMINOPHEN 325 MG PO TABS
650.0000 mg | ORAL_TABLET | Freq: Four times a day (QID) | ORAL | Status: DC | PRN
  Administered 2022-04-21: 650 mg via ORAL
  Filled 2022-04-21: qty 2

## 2022-04-21 MED ORDER — VANCOMYCIN HCL 2000 MG/400ML IV SOLN
2000.0000 mg | Freq: Once | INTRAVENOUS | Status: AC
  Administered 2022-04-21: 2000 mg via INTRAVENOUS
  Filled 2022-04-21: qty 400

## 2022-04-21 MED ORDER — ONDANSETRON HCL 4 MG PO TABS: 4.0000 mg | ORAL_TABLET | Freq: Four times a day (QID) | ORAL | Status: DC | PRN

## 2022-04-21 MED ORDER — TBO-FILGRASTIM 300 MCG/0.5ML ~~LOC~~ SOSY
300.0000 ug | PREFILLED_SYRINGE | Freq: Once | SUBCUTANEOUS | Status: AC
  Administered 2022-04-21: 300 ug via SUBCUTANEOUS
  Filled 2022-04-21: qty 0.5

## 2022-04-21 MED ORDER — SODIUM CHLORIDE 0.9% IV SOLUTION: Freq: Once | INTRAVENOUS | Status: DC

## 2022-04-21 MED ORDER — OXYCODONE HCL 5 MG PO TABS
5.0000 mg | ORAL_TABLET | ORAL | Status: DC | PRN
  Administered 2022-04-21 – 2022-04-23 (×2): 5 mg via ORAL
  Filled 2022-04-21 (×2): qty 1

## 2022-04-21 MED ORDER — SENNOSIDES-DOCUSATE SODIUM 8.6-50 MG PO TABS: 1.0000 | ORAL_TABLET | Freq: Every evening | ORAL | Status: DC | PRN

## 2022-04-21 MED ORDER — DIPHENHYDRAMINE HCL 50 MG/ML IJ SOLN
12.5000 mg | Freq: Four times a day (QID) | INTRAMUSCULAR | Status: DC | PRN
  Administered 2022-04-21: 12.5 mg via INTRAVENOUS
  Filled 2022-04-21: qty 1

## 2022-04-21 NOTE — Progress Notes (Signed)
Date and time results received: 04/21/22 0643   Test: WBC & HGB Critical Value: 1.4 & 6.8  Name of Provider Notified: Lennox Grumbles, NP  Orders Received? Or Actions Taken?: aware - unit pRBCs currently infusing

## 2022-04-21 NOTE — Progress Notes (Signed)
Pharmacy Antibiotic Note  Tammy Garrison is a 31 y.o. female admitted on 04/20/2022 with medical history significant for squamous cell anal cancer on active chemoradiation, anemia due to iron deficiency and menorrhagia/fibroid uterus who presented to the ED for evaluation of right axillary abscess..  Pharmacy has been consulted to dose vancomycin for wound infection.  Plan: Vancomycin 2gm IV x 1 then '1500mg'$  q12h (AUC 486.6, Scr 0.8) Cefepime 2gm IV q8h (per MD) Follow renal function, cultures and clinical course  Height: 6' (182.9 cm) Weight: 118.8 kg (262 lb) IBW/kg (Calculated) : 73.1  Temp (24hrs), Avg:98.7 F (37.1 C), Min:98.3 F (36.8 C), Max:99.1 F (37.3 C)  Recent Labs  Lab 04/15/22 0927 04/20/22 0932 04/20/22 1939  WBC 4.4 1.7* 1.5*  CREATININE  --  0.67 0.61    Estimated Creatinine Clearance: 149.7 mL/min (by C-G formula based on SCr of 0.61 mg/dL).    Allergies  Allergen Reactions   Coconut Flavor Itching and Rash    Itchy throat, rash,itching     Antimicrobials this admission: 9/19 vanc >> 9/19 cefepime >>  Dose adjustments this admission:   Microbiology results: 9/18 BCx:   Thank you for allowing pharmacy to be a part of this patient's care.  Dolly Rias RPh 04/21/2022, 1:13 AM

## 2022-04-21 NOTE — Progress Notes (Signed)
PROGRESS NOTE    Tammy Garrison  GUR:[id number] DOB: February 01, 1991 DOA: 04/20/2022 PCP: Pcp, No    Brief Narrative:  31 year old with history of squamous cell anal cancer on active chemoradiation, last chemo yesterday, anemia iron deficiency, menorrhagia and fibroid uterus sent to ER from cancer center with right axillary abscess, pancytopenia.  Also has ongoing vaginal bleeding and rectal bleeding.   Assessment & Plan:   Abscess right axilla in a patient with immunocompromise status: Some spontaneous drainage today.  Blood cultures.  Local cultures.  Currently on vancomycin and cefepime.  Surgery following.  For I&D tomorrow under anesthesia.  Pancytopenia: Status post chemo.  Platelets are adequate and more than 50,000.  Hemoglobin trending down.  Oncology recommended 1 unit of PRBC.  We will recheck after transfusion.  Will ask oncology if she needs any Neupogen.  Anemia, acute on chronic multifactorial anemia.  1 unit PRBC today.  Squamous cell anal cancer: Receiving chemo.  Last dose 9/18.  Will inform Dr. Burr Medico about patient being here.   DVT prophylaxis: SCDs Start: 04/21/22 0007   Code Status: Full code Family Communication: Mother at the bedside Disposition Plan: Status is: Inpatient Remains inpatient appropriate because: Active infection, pancytopenia     Consultants:  Surgery Oncology  Procedures:  None  Antimicrobials:  Vancomycin and cefepime 9/18---   Subjective: Patient seen and examined.  Mother at the bedside.  Multiple questions answered.  Patient complained of pain not controlled with oxycodone.  They discussed with surgery and they decided to do surgery under anesthesia tomorrow.  Receiving 1 unit of blood.  Early labs were before blood transfusion.   Objective: Vitals:   04/21/22 0438 04/21/22 0527 04/21/22 0558 04/21/22 0839  BP: 123/80 121/82 118/65 109/64  Pulse: (!) 110 (!) 102 99 (!) 102  Resp: '18 18 18 16  '$ Temp: 98.7 F (37.1 C)  98.7 F (37.1 C) 98.4 F (36.9 C) 98.6 F (37 C)  TempSrc: Oral Oral Oral Oral  SpO2: 100% 100% 98% 100%  Weight:      Height:        Intake/Output Summary (Last 24 hours) at 04/21/2022 1123 Last data filed at 04/21/2022 0839 Gross per 24 hour  Intake 855.23 ml  Output --  Net 855.23 ml   Filed Weights   04/20/22 1921  Weight: 118.8 kg    Examination:  General exam: Appears  anxious.  Sick looking.  Looks comfortable on room air. Respiratory system: No added sounds. Cardiovascular system: S1 & S2 heard, RRR. No pedal edema. Gastrointestinal system: Soft.  Nontender.. Central nervous system: Alert and oriented. No focal neurological deficits. Extremities: Symmetric 5 x 5 power. Skin: Swelling and tenderness right axilla, minimal drainage on the dressing.   Data Reviewed: I have personally reviewed following labs and imaging studies  CBC: Recent Labs  Lab 04/15/22 0927 04/20/22 0932 04/20/22 1939 04/21/22 0540  WBC 4.4 1.7* 1.5* 1.4*  NEUTROABS 4.2 1.4* 1.2* 1.0*  HGB 8.1* 7.3* 7.4* 6.8*  HCT 25.1* 22.0* 23.4* 21.9*  MCV 80.7 81.5 84.8 85.2  PLT 71* 48* 54* 51*   Basic Metabolic Panel: Recent Labs  Lab 04/20/22 0932 04/20/22 1939 04/21/22 0540  NA 135 135 138  K 3.5 3.7 3.8  CL 103 104 107  CO2 21* 23 21*  GLUCOSE 123* 113* 99  BUN '9 10 8  '$ CREATININE 0.67 0.61 0.70  CALCIUM 9.3 9.1 9.2   GFR: Estimated Creatinine Clearance: 149.7 mL/min (by C-G formula based on SCr of  0.7 mg/dL). Liver Function Tests: Recent Labs  Lab 04/20/22 0932 04/20/22 1939  AST 16 32  ALT 23 37  ALKPHOS 74 73  BILITOT 0.4 0.4  PROT 7.5 7.7  ALBUMIN 3.8 3.4*   Recent Labs  Lab 04/20/22 1939  LIPASE 23   No results for input(s): "AMMONIA" in the last 168 hours. Coagulation Profile: No results for input(s): "INR", "PROTIME" in the last 168 hours. Cardiac Enzymes: No results for input(s): "CKTOTAL", "CKMB", "CKMBINDEX", "TROPONINI" in the last 168 hours. BNP (last  3 results) No results for input(s): "PROBNP" in the last 8760 hours. HbA1C: No results for input(s): "HGBA1C" in the last 72 hours. CBG: No results for input(s): "GLUCAP" in the last 168 hours. Lipid Profile: No results for input(s): "CHOL", "HDL", "LDLCALC", "TRIG", "CHOLHDL", "LDLDIRECT" in the last 72 hours. Thyroid Function Tests: No results for input(s): "TSH", "T4TOTAL", "FREET4", "T3FREE", "THYROIDAB" in the last 72 hours. Anemia Panel: No results for input(s): "VITAMINB12", "FOLATE", "FERRITIN", "TIBC", "IRON", "RETICCTPCT" in the last 72 hours. Sepsis Labs: No results for input(s): "PROCALCITON", "LATICACIDVEN" in the last 168 hours.  Recent Results (from the past 240 hour(s))  Blood culture (routine x 2)     Status: None (Preliminary result)   Collection Time: 04/20/22 10:26 PM   Specimen: BLOOD  Result Value Ref Range Status   Specimen Description   Final    BLOOD LEFT ANTECUBITAL Performed at [facility] [address] [place], [place] [address]    Special Requests   Final    BLOOD Blood Culture results may not be optimal due to an excessive volume of blood received in culture bottles Performed at [facility] [address] [place], [place] [address]    Culture   Final    NO GROWTH < 12 HOURS Performed at [facility] [address], [place], [place] [address]    Report Status PENDING  Incomplete  Blood culture (routine x 2)     Status: None (Preliminary result)   Collection Time: 04/20/22 10:31 PM   Specimen: BLOOD  Result Value Ref Range Status   Specimen Description   Final    BLOOD BLOOD RIGHT ARM Performed at [facility] [address] [place], [place] [address]    Special Requests   Final    BLOOD Blood Culture adequate volume Performed at [facility] [address], [place], [place] [address]    Culture   Final    NO GROWTH < 12 HOURS Performed at [facility] [address] [place], [place] [address]    Report Status PENDING  Incomplete         Radiology Studies: DG Chest 2 View  Result Date: 04/20/2022 CLINICAL DATA:  Rectal bleeding, tachycardia, rectal cancer EXAM: CHEST - 2 VIEW COMPARISON:  08/14/2020 FINDINGS: Frontal and lateral views of the chest demonstrate an unremarkable cardiac silhouette. No airspace disease, effusion, or pneumothorax. No acute bony abnormalities. IMPRESSION: 1. No acute intrathoracic process. Electronically Signed   By: Randa Ngo M.D.   On: 04/20/2022 19:58        Scheduled Meds:  sodium chloride   Intravenous Once   Continuous Infusions:  ceFEPime (MAXIPIME) IV 2 g (04/21/22 0949)   vancomycin       LOS: 1 day    Time spent: 35 minutes    Barb Merino, MD [facility] Pager [phone]

## 2022-04-21 NOTE — Consult Note (Signed)
Reason for Consult:right axillary abscess Referring Physician: Dr Sung Amabile Tammy Garrison is an 31 y.o. female.  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 She noted several days ago right axillary mass that is tender. Not draining. She has had one before remotely.  WBC is 1.4 and Hct is 21.9 on lab evaluation.  Was discussed with Dr Dema Severin who recommended admission.  Oncology recommended one unit prbcs and abx.  We were asked to see her.   Past Medical History:  Diagnosis Date   Bipolar 1 disorder ([place])    ETOH abuse    H/O self-harm    H/O suicide attempt    x 5 - last 05/2016 - overdose Ibuprofen   Marijuana abuse    Rheumatoid arthritis ([place])     Past Surgical History:  Procedure Laterality Date   HEMORRHOID SURGERY N/A 02/08/2022   Procedure: EXTERNAL AND INTERNAL HEMORRHOIDECTOMY;  Surgeon: Georganna Skeans, MD;  Location: [place];  Service: General;  Laterality: N/A;    Family History  Problem Relation Age of Onset   Hypertension Other    Lupus Mother     Social History:  reports that she quit smoking about 3 years ago. Her smoking use included cigarettes. She has a 1.75 pack-year smoking history. She has never used smokeless tobacco. She reports that she does not currently use alcohol. She reports that she does not currently use drugs after having used the following drugs: Marijuana.  Allergies:  Allergies  Allergen Reactions   Coconut Flavor Itching and Rash    Itchy throat, rash,itching    Current Facility-Administered Medications on File Prior to Encounter  Medication Dose Route Frequency Provider Last Rate Last Admin   0.9 %  sodium chloride infusion (Manually program via Guardrails IV Fluids)  250 mL Intravenous Once Truitt Merle, MD       heparin lock flush 100 unit/mL  250 Units Intracatheter Once Truitt Merle, MD       sodium chloride flush (NS) 0.9 % injection 10  mL  10 mL Intracatheter Once Truitt Merle, MD       sodium chloride flush (NS) 0.9 % injection 10 mL  10 mL Intracatheter Once Truitt Merle, MD       sodium chloride flush (NS) 0.9 % injection 10 mL  10 mL Intracatheter Once Truitt Merle, MD       Current Outpatient Medications on File Prior to Encounter  Medication Sig Dispense Refill   acetaminophen-codeine (TYLENOL #3) 300-30 MG tablet Take 1 tablet by mouth every 6 hours as needed for moderate pain. 30 tablet 0   amoxicillin-clavulanate (AUGMENTIN) 875-125 MG tablet Take 1 tablet by mouth 2 (two) times daily. 14 tablet 0   dexamethasone (DECADRON) 4 MG tablet Take 1 tablet (4 mg total) by mouth daily. Take daily for up to 5 days after chemo. 30 tablet 0   estrogens, conjugated, (PREMARIN) 1.25 MG tablet Take 1 tablet (1.25 mg total) by mouth 2 (two) times daily. 14 tablet 0   HYDROcodone-acetaminophen (NORCO/VICODIN) 5-325 MG tablet Take 1-2 tablets by mouth every 12 (twelve) hours as needed for moderate pain. 45 tablet 0   LORazepam (ATIVAN) 0.5 MG tablet Take 1 tablet (0.5 mg total) by mouth every 6 (six) hours as needed (nausea). 20 tablet 0   megestrol (MEGACE) 40 MG tablet Take 1 tablet (40 mg total) by mouth 2 (two) times daily. 28 tablet 0  ondansetron (ZOFRAN-ODT) 8 MG disintegrating tablet Dissolve 1 tablet (8 mg total) by mouth every 8 (eight) hours as needed for nausea or vomiting. 20 tablet 1   promethazine (PHENERGAN) 25 MG tablet Take 1 tablet (25 mg total) by mouth every 6 (six) hours as needed for nausea or vomiting. 30 tablet 1   [DISCONTINUED] prochlorperazine (COMPAZINE) 10 MG tablet Take 1 tablet (10 mg total) by mouth every 6 (six) hours as needed (Nausea or vomiting). 30 tablet 1    Results for orders placed or performed during the hospital encounter of 04/20/22 (from the past 48 hour(s))  CBC     Status: Abnormal   Collection Time: 04/20/22  7:39 PM  Result Value Ref Range   WBC 1.5 (L) 4.0 - 10.5 K/uL   RBC 2.76 (L) 3.87 -  5.11 MIL/uL   Hemoglobin 7.4 (L) 12.0 - 15.0 g/dL   HCT 23.4 (L) 36.0 - 46.0 %   MCV 84.8 80.0 - 100.0 fL   MCH 26.8 26.0 - 34.0 pg   MCHC 31.6 30.0 - 36.0 g/dL   RDW 34.5 (H) 11.5 - 15.5 %   Platelets 54 (L) 150 - 400 K/uL    Comment: SPECIMEN CHECKED FOR CLOTS Immature Platelet Fraction may be clinically indicated, consider ordering this additional test [id number] REPEATED TO VERIFY PLATELET COUNT CONFIRMED BY SMEAR    nRBC 0.0 0.0 - 0.2 %    Comment: Performed at [facility], [place] [address], [place], [place] [address]  Troponin I (High Sensitivity)     Status: None   Collection Time: 04/20/22  7:39 PM  Result Value Ref Range   Troponin I (High Sensitivity) 2 <18 ng/L    Comment: (NOTE) Elevated high sensitivity troponin I (hsTnI) values and significant  changes across serial measurements may suggest ACS but many other  chronic and acute conditions are known to elevate hsTnI results.  Refer to the "Links" section for chest pain algorithms and additional  guidance. Performed at [facility], [place] [address], [place], [place] [address]   Comprehensive metabolic panel     Status: Abnormal   Collection Time: 04/20/22  7:39 PM  Result Value Ref Range   Sodium 135 135 - 145 mmol/L   Potassium 3.7 3.5 - 5.1 mmol/L   Chloride 104 98 - 111 mmol/L   CO2 23 22 - 32 mmol/L   Glucose, Bld 113 (H) 70 - 99 mg/dL    Comment: Glucose reference range applies only to samples taken after fasting for at least 8 hours.   BUN 10 6 - 20 mg/dL   Creatinine, Ser 0.61 0.44 - 1.00 mg/dL   Calcium 9.1 8.9 - 10.3 mg/dL   Total Protein 7.7 6.5 - 8.1 g/dL   Albumin 3.4 (L) 3.5 - 5.0 g/dL   AST 32 15 - 41 U/L   ALT 37 0 - 44 U/L   Alkaline Phosphatase 73 38 - 126 U/L   Total Bilirubin 0.4 0.3 - 1.2 mg/dL   GFR, Estimated >60 >60 mL/min    Comment: (NOTE) Calculated using the CKD-EPI Creatinine Equation (2021)    Anion gap 8 5 - 15    Comment: Performed at  [facility], [place] [address], [place], [place] [address]  Lipase, blood     Status: None   Collection Time: 04/20/22  7:39 PM  Result Value Ref Range   Lipase 23 11 - 51 U/L    Comment: Performed at [facility], [place] [address], [place], [place] [address]  Differential     Status: Abnormal   Collection Time: 04/20/22  7:39 PM  Result Value Ref Range   Neutrophils Relative % 80 %   Neutro Abs 1.2 (L) 1.7 - 7.7 K/uL   Lymphocytes Relative 7 %   Lymphs Abs 0.1 (L) 0.7 - 4.0 K/uL   Monocytes Relative 10 %   Monocytes Absolute 0.2 0.1 - 1.0 K/uL   Eosinophils Relative 1 %   Eosinophils Absolute 0.0 0.0 - 0.5 K/uL   Basophils Relative 1 %   Basophils Absolute 0.0 0.0 - 0.1 K/uL   Immature Granulocytes 1 %   Abs Immature Granulocytes 0.02 0.00 - 0.07 K/uL    Comment: Performed at [facility], [place] [address] [place], [place] [address]  I-Stat beta hCG blood, ED     Status: None   Collection Time: 04/20/22  7:43 PM  Result Value Ref Range   I-stat hCG, quantitative <5.0 <5 mIU/mL   Comment 3            Comment:   GEST. AGE      CONC.  (mIU/mL)   <=1 WEEK        5 - 50     2 WEEKS       50 - 500     3 WEEKS       100 - 10,000     4 WEEKS     1,000 - 30,000        FEMALE AND NON-PREGNANT FEMALE:     LESS THAN 5 mIU/mL   Type and screen [place]     Status: None (Preliminary result)   Collection Time: 04/20/22  8:03 PM  Result Value Ref Range   ABO/RH(D) AB POS    Antibody Screen NEG    Sample Expiration 04/23/2022,2359    Unit Number [id number]    Blood Component Type RED CELLS,LR    Unit division 00    Status of Unit ISSUED    Transfusion Status OK TO TRANSFUSE    Crossmatch Result      Compatible Performed at [facility], [place] [address] [place], [place] [address]   Troponin I (High Sensitivity)     Status: None   Collection Time: 04/20/22  9:29 PM  Result Value Ref Range    Troponin I (High Sensitivity) <2 <18 ng/L    Comment: (NOTE) Elevated high sensitivity troponin I (hsTnI) values and significant  changes across serial measurements may suggest ACS but many other  chronic and acute conditions are known to elevate hsTnI results.  Refer to the "Links" section for chest pain algorithms and additional  guidance. Performed at [facility], [place] [address], [place], [place] [address]   Prepare RBC (crossmatch)     Status: None   Collection Time: 04/20/22 10:08 PM  Result Value Ref Range   Order Confirmation      ORDER PROCESSED BY BLOOD BANK Performed at [facility], [place] [address] [place],  [address]   Basic metabolic panel     Status: Abnormal   Collection Time: 04/21/22  5:40 AM  Result Value Ref Range   Sodium 138 135 - 145 mmol/L   Potassium 3.8 3.5 - 5.1 mmol/L   Chloride 107 98 - 111 mmol/L   CO2 21 (L) 22 - 32 mmol/L   Glucose, Bld 99 70 - 99 mg/dL  Comment: Glucose reference range applies only to samples taken after fasting for at least 8 hours.   BUN 8 6 - 20 mg/dL   Creatinine, Ser 0.70 0.44 - 1.00 mg/dL   Calcium 9.2 8.9 - 10.3 mg/dL   GFR, Estimated >60 >60 mL/min    Comment: (NOTE) Calculated using the CKD-EPI Creatinine Equation (2021)    Anion gap 10 5 - 15    Comment: Performed at [facility], [place] [address], [place], [place] [address]  CBC with Differential/Platelet     Status: Abnormal   Collection Time: 04/21/22  5:40 AM  Result Value Ref Range   WBC 1.4 (LL) 4.0 - 10.5 K/uL    Comment: This critical result has verified and been called to [place] by [facility] on 09 19 2023 at 0644, and has been read back. CRITICAL RESULT CALLED   RBC 2.57 (L) 3.87 - 5.11 MIL/uL   Hemoglobin 6.8 (LL) 12.0 - 15.0 g/dL    Comment: This critical result has verified and been called to [place] by [facility] on 09 19 2023 at 0644, and has been read back.  CRITICAL RESULT CALLED   HCT 21.9 (L) 36.0 - 46.0 %   MCV 85.2 80.0 - 100.0 fL   MCH 26.5 26.0 - 34.0 pg   MCHC 31.1 30.0 - 36.0 g/dL   RDW Not Measured 11.5 - 15.5 %   Platelets 51 (L) 150 - 400 K/uL    Comment: SPECIMEN CHECKED FOR CLOTS Immature Platelet Fraction may be clinically indicated, consider ordering this additional test [id number] REPEATED TO VERIFY PLATELET COUNT CONFIRMED BY SMEAR    nRBC 0.0 0.0 - 0.2 %   Neutrophils Relative % 75 %   Neutro Abs 1.0 (L) 1.7 - 7.7 K/uL   Lymphocytes Relative 10 %   Lymphs Abs 0.1 (L) 0.7 - 4.0 K/uL   Monocytes Relative 13 %   Monocytes Absolute 0.2 0.1 - 1.0 K/uL   Eosinophils Relative 1 %   Eosinophils Absolute 0.0 0.0 - 0.5 K/uL   Basophils Relative 0 %   Basophils Absolute 0.0 0.0 - 0.1 K/uL   Immature Granulocytes 1 %   Abs Immature Granulocytes 0.01 0.00 - 0.07 K/uL   Dohle Bodies PRESENT     Comment: Performed at [facility], [place] [address], [place], [place] [address]    DG Chest 2 View  Result Date: 04/20/2022 CLINICAL DATA:  Rectal bleeding, tachycardia, rectal cancer EXAM: CHEST - 2 VIEW COMPARISON:  08/14/2020 FINDINGS: Frontal and lateral views of the chest demonstrate an unremarkable cardiac silhouette. No airspace disease, effusion, or pneumothorax. No acute bony abnormalities. IMPRESSION: 1. No acute intrathoracic process. Electronically Signed   By: Randa Ngo M.D.   On: 04/20/2022 19:58    Review of Systems  Constitutional:  Positive for fatigue. Negative for fever.  All other systems reviewed and are negative.  Blood pressure 118/65, pulse 99, temperature 98.4 F (36.9 C), temperature source Oral, resp. rate 18, height 6' (1.829 m), weight 118.8 kg, last menstrual period 03/29/2022, SpO2 98 %. Physical Exam Constitutional:      Appearance: Normal appearance.  Eyes:     General: No scleral icterus. Cardiovascular:     Rate and Rhythm: Normal rate and regular rhythm.  Pulmonary:      Effort: Pulmonary effort is normal.     Comments: Right axilla with 2-3 cm fluctuant tender abscess, very tender to touch Neurological:     Mental Status: She  is alert.     Assessment/Plan: RIght axillary abscess -I think this needs drainage and likely difficult at bedside. Discussed incision and drainage with her today.  Nothing to eat or drink.  Possibly could try to do at bedside depending on OR availability but not sure she would tolerate well.    Rolm Bookbinder 04/21/2022, 6:53 AM

## 2022-04-21 NOTE — Hospital Course (Signed)
Tammy Garrison is a 31 y.o. 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     9/18: Admitted on IV antibiotics, Gen Surg consulted 9/19: Granix given, transfused 1u PRBC 9/20: To OR for I&D

## 2022-04-21 NOTE — Assessment & Plan Note (Signed)
Culture growing Proteus - Continue cefepime - Stop vancomycin - Follow-up abscess culture finalization - Consult Gen Surg, appreciate cares

## 2022-04-21 NOTE — Progress Notes (Signed)
Patients axilla started spontaneously draining. I offered to perform bedside I&D but, I agree with Dr. Donne Hazel, I am concerned about inadequate draiange due to patients pain. We have multiple urgent surgical cases today and will not be able to perform her surgery until tomorrow. I have ordered the patient a regular diet. NPO after MN in anticipation for OR tomorrow.     Obie Dredge, PA-C [facility] Please see Amion for pager number during day hours 7:00am-4:30pm

## 2022-04-21 NOTE — TOC Initial Note (Signed)
Transition of Care [facility]) - Initial/Assessment Note    Patient Details  Name: Tammy Garrison MRN: [id number] Date of Birth: 1991-03-25  Transition of Care [facility]) CM/SW Contact:    Leeroy Cha, RN Phone Number: 04/21/2022, 7:52 AM  Clinical Narrative:                  Transition of Care [facility]) Screening Note   Patient Details  Name: Tammy Garrison Date of Birth: 07/03/1991   Transition of Care [facility]) CM/SW Contact:    Leeroy Cha, RN Phone Number: 04/21/2022, 7:52 AM    Transition of Care Department [facility]) has reviewed patient and no TOC needs have been identified at this time. We will continue to monitor patient advancement through interdisciplinary progression rounds. If new patient transition needs arise, please place a TOC consult.    Expected Discharge Plan: Home/Self Care Barriers to Discharge: Continued Medical Work up   Patient Goals and CMS Choice Patient states their goals for this hospitalization and ongoing recovery are:: to go home CMS Medicare.gov Compare Post Acute Care list provided to:: Patient    Expected Discharge Plan and Services Expected Discharge Plan: Home/Self Care   Discharge Planning Services: CM Consult   Living arrangements for the past 2 months: Apartment                                      Prior Living Arrangements/Services Living arrangements for the past 2 months: Apartment Lives with:: Self Patient language and need for interpreter reviewed:: Yes Do you feel safe going back to the place where you live?: Yes            Criminal Activity/Legal Involvement Pertinent to Current Situation/Hospitalization: No - Comment as needed  Activities of Daily Living      Permission Sought/Granted                  Emotional Assessment Appearance:: Appears stated age Attitude/Demeanor/Rapport: Engaged Affect (typically observed): Calm Orientation: : Oriented to Self, Oriented to Place,  Oriented to  Time, Oriented to Situation Alcohol / Substance Use: Tobacco Use, Illicit Drugs (quit smoking 3 years ago, hx of marjuana use) Psych Involvement: No (comment)  Admission diagnosis:  Shortness of breath [R06.02] Thrombocytopenia (HCC) [D69.6] Abscess of right axilla [L02.411] Anemia, unspecified type [D64.9] Neutropenia, unspecified type ([place]) [D70.9] Patient Active Problem List   Diagnosis Date Noted   Abscess of right axilla 04/20/2022   Pancytopenia ([place]) 04/20/2022   PICC (peripherally inserted central catheter) in place 03/14/2022   Dehydration 03/13/2022   Nausea with vomiting 03/13/2022   Anal squamous cell carcinoma ([place]) 02/23/2022   Bipolar 1 disorder ([place]) 02/23/2022   RA (rheumatoid arthritis) ([place]) 02/07/2022   Syncope 02/06/2022   ABLA (acute blood loss anemia) 02/06/2022   BRBPR (bright red blood per rectum) 02/06/2022   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 [address] Phone: [phone] Fax: [phone]  [facility] [address] [place], [place] [address] Phone: [phone] Fax: [phone]  [id number] [place], [place] [address] [place]  [place] [address] Phone: [phone] Fax: [phone]  [place] [address] Phone: [phone] Fax: [phone]     Social Determinants of Health (SDOH) Interventions    Readmission Risk Interventions   No data to display

## 2022-04-21 NOTE — Assessment & Plan Note (Signed)
New neutropenia with ANC 1200.  Platelets 54,000.  Hemoglobin trending down last 2 weeks from 10 >> 7.4 in setting of chemotherapy and ongoing uterine bleeding. -Transfuse 1 unit PRBC -Repeat CBC with differential in a.m. -May need platelet transfusion

## 2022-04-22 ENCOUNTER — Encounter (HOSPITAL_COMMUNITY): Payer: Self-pay | Admitting: Internal Medicine

## 2022-04-22 ENCOUNTER — Inpatient Hospital Stay (HOSPITAL_COMMUNITY): Payer: No Typology Code available for payment source | Admitting: Anesthesiology

## 2022-04-22 ENCOUNTER — Encounter (HOSPITAL_COMMUNITY)
Admission: EM | Disposition: A | Discharge status: Home / Self Care | Payer: Self-pay | Source: Ambulatory Visit | Attending: Family Medicine

## 2022-04-22 DIAGNOSIS — L02411 Cutaneous abscess of right axilla: Secondary | ICD-10-CM

## 2022-04-22 DIAGNOSIS — D61818 Other pancytopenia: Secondary | ICD-10-CM

## 2022-04-22 DIAGNOSIS — E669 Obesity, unspecified: Secondary | ICD-10-CM

## 2022-04-22 DIAGNOSIS — C210 Malignant neoplasm of anus, unspecified: Secondary | ICD-10-CM

## 2022-04-22 DIAGNOSIS — D649 Anemia, unspecified: Secondary | ICD-10-CM

## 2022-04-22 DIAGNOSIS — M069 Rheumatoid arthritis, unspecified: Secondary | ICD-10-CM

## 2022-04-22 HISTORY — PX: INCISION AND DRAINAGE ABSCESS: SHX5864

## 2022-04-22 LAB — CBC
HCT: 24.5 % — ABNORMAL LOW (ref 36.0–46.0)
Hemoglobin: 7.8 g/dL — ABNORMAL LOW (ref 12.0–15.0)
MCH: 28.1 pg (ref 26.0–34.0)
MCHC: 31.8 g/dL (ref 30.0–36.0)
MCV: 88.1 fL (ref 80.0–100.0)
Platelets: 50 10*3/uL — ABNORMAL LOW (ref 150–400)
RBC: 2.78 MIL/uL — ABNORMAL LOW (ref 3.87–5.11)
RDW: 33.6 % — ABNORMAL HIGH (ref 11.5–15.5)
WBC: 2 10*3/uL — ABNORMAL LOW (ref 4.0–10.5)
nRBC: 0 % (ref 0.0–0.2)

## 2022-04-22 LAB — PROTIME-INR
INR: 1 (ref 0.8–1.2)
Prothrombin Time: 13.4 seconds (ref 11.4–15.2)

## 2022-04-22 SURGERY — INCISION AND DRAINAGE, ABSCESS
Anesthesia: General | Laterality: Right

## 2022-04-22 MED ORDER — FENTANYL CITRATE PF 50 MCG/ML IJ SOSY
PREFILLED_SYRINGE | INTRAMUSCULAR | Status: AC
  Filled 2022-04-22: qty 1

## 2022-04-22 MED ORDER — HYDROCORT-PRAMOXINE (PERIANAL) 2.5-1 % EX CREA
TOPICAL_CREAM | Freq: Four times a day (QID) | CUTANEOUS | Status: DC
  Administered 2022-04-22: 1 via RECTAL
  Filled 2022-04-22: qty 30

## 2022-04-22 MED ORDER — LIDOCAINE HCL (PF) 2 % IJ SOLN
INTRAMUSCULAR | Status: AC
  Filled 2022-04-22: qty 5

## 2022-04-22 MED ORDER — ONDANSETRON HCL 4 MG/2ML IJ SOLN
INTRAMUSCULAR | Status: DC | PRN
  Administered 2022-04-22: 4 mg via INTRAVENOUS

## 2022-04-22 MED ORDER — MIDAZOLAM HCL 2 MG/2ML IJ SOLN
INTRAMUSCULAR | Status: AC
  Filled 2022-04-22: qty 2

## 2022-04-22 MED ORDER — ORAL CARE MOUTH RINSE: 15.0000 mL | Freq: Once | OROMUCOSAL | Status: AC

## 2022-04-22 MED ORDER — PROMETHAZINE HCL 25 MG/ML IJ SOLN: 6.2500 mg | INTRAMUSCULAR | Status: DC | PRN

## 2022-04-22 MED ORDER — TBO-FILGRASTIM 480 MCG/0.8ML ~~LOC~~ SOSY
480.0000 ug | PREFILLED_SYRINGE | Freq: Every day | SUBCUTANEOUS | Status: DC
  Administered 2022-04-22: 480 ug via SUBCUTANEOUS
  Filled 2022-04-22 (×2): qty 0.8

## 2022-04-22 MED ORDER — LIDOCAINE HCL URETHRAL/MUCOSAL 2 % EX GEL
1.0000 | Freq: Once | CUTANEOUS | Status: AC
  Administered 2022-04-22: 1 via TOPICAL
  Filled 2022-04-22: qty 5

## 2022-04-22 MED ORDER — DEXAMETHASONE SODIUM PHOSPHATE 10 MG/ML IJ SOLN
INTRAMUSCULAR | Status: DC | PRN
  Administered 2022-04-22: 10 mg via INTRAVENOUS

## 2022-04-22 MED ORDER — DEXAMETHASONE SODIUM PHOSPHATE 10 MG/ML IJ SOLN
INTRAMUSCULAR | Status: AC
  Filled 2022-04-22: qty 1

## 2022-04-22 MED ORDER — FENTANYL CITRATE PF 50 MCG/ML IJ SOSY
PREFILLED_SYRINGE | INTRAMUSCULAR | Status: AC
  Filled 2022-04-22: qty 2

## 2022-04-22 MED ORDER — LACTATED RINGERS IV SOLN: INTRAVENOUS | Status: DC

## 2022-04-22 MED ORDER — ACETAMINOPHEN 500 MG PO TABS: 1000.0000 mg | ORAL_TABLET | Freq: Once | ORAL | Status: DC

## 2022-04-22 MED ORDER — ONDANSETRON HCL 4 MG/2ML IJ SOLN
INTRAMUSCULAR | Status: AC
  Filled 2022-04-22: qty 2

## 2022-04-22 MED ORDER — MIDAZOLAM HCL 5 MG/5ML IJ SOLN
INTRAMUSCULAR | Status: DC | PRN
  Administered 2022-04-22: 2 mg via INTRAVENOUS

## 2022-04-22 MED ORDER — BUPIVACAINE-EPINEPHRINE (PF) 0.25% -1:200000 IJ SOLN
INTRAMUSCULAR | Status: AC
  Filled 2022-04-22: qty 30

## 2022-04-22 MED ORDER — ROCURONIUM BROMIDE 10 MG/ML (PF) SYRINGE
PREFILLED_SYRINGE | INTRAVENOUS | Status: AC
  Filled 2022-04-22: qty 10

## 2022-04-22 MED ORDER — PROPOFOL 10 MG/ML IV BOLUS
INTRAVENOUS | Status: AC
  Filled 2022-04-22: qty 20

## 2022-04-22 MED ORDER — ACETAMINOPHEN 10 MG/ML IV SOLN
INTRAVENOUS | Status: DC | PRN
  Administered 2022-04-22: 1000 mg via INTRAVENOUS

## 2022-04-22 MED ORDER — PROPOFOL 10 MG/ML IV BOLUS
INTRAVENOUS | Status: DC | PRN
  Administered 2022-04-22: 170 mg via INTRAVENOUS

## 2022-04-22 MED ORDER — CHLORHEXIDINE GLUCONATE 0.12 % MT SOLN
15.0000 mL | Freq: Once | OROMUCOSAL | Status: AC
  Administered 2022-04-22: 15 mL via OROMUCOSAL

## 2022-04-22 MED ORDER — FENTANYL CITRATE (PF) 100 MCG/2ML IJ SOLN
INTRAMUSCULAR | Status: AC
  Filled 2022-04-22: qty 2

## 2022-04-22 MED ORDER — BUPIVACAINE-EPINEPHRINE (PF) 0.25% -1:200000 IJ SOLN
INTRAMUSCULAR | Status: DC | PRN
  Administered 2022-04-22: 20 mL via PERINEURAL

## 2022-04-22 MED ORDER — FENTANYL CITRATE (PF) 100 MCG/2ML IJ SOLN
INTRAMUSCULAR | Status: DC | PRN
  Administered 2022-04-22 (×2): 50 ug via INTRAVENOUS

## 2022-04-22 MED ORDER — ACETAMINOPHEN 10 MG/ML IV SOLN
INTRAVENOUS | Status: AC
  Filled 2022-04-22: qty 100

## 2022-04-22 MED ORDER — 0.9 % SODIUM CHLORIDE (POUR BTL) OPTIME
TOPICAL | Status: DC | PRN
  Administered 2022-04-22: 1000 mL

## 2022-04-22 MED ORDER — FENTANYL CITRATE PF 50 MCG/ML IJ SOSY
25.0000 ug | PREFILLED_SYRINGE | INTRAMUSCULAR | Status: DC | PRN
  Administered 2022-04-22 (×3): 50 ug via INTRAVENOUS

## 2022-04-22 MED ORDER — LIDOCAINE 2% (20 MG/ML) 5 ML SYRINGE
INTRAMUSCULAR | Status: DC | PRN
  Administered 2022-04-22: 100 mg via INTRAVENOUS

## 2022-04-22 SURGICAL SUPPLY — 28 items
BAG COUNTER SPONGE SURGICOUNT (BAG) IMPLANT
BLADE SURG SZ11 CARB STEEL (BLADE) ×1 IMPLANT
BNDG CONFORM 4 STRL LF (GAUZE/BANDAGES/DRESSINGS) IMPLANT
CHLORAPREP W/TINT 26 (MISCELLANEOUS) ×1 IMPLANT
COVER SURGICAL LIGHT HANDLE (MISCELLANEOUS) ×1 IMPLANT
DRAPE LAPAROSCOPIC ABDOMINAL (DRAPES) IMPLANT
DRAPE LAPAROTOMY TRNSV 102X78 (DRAPES) IMPLANT
ELECT REM PT RETURN 15FT ADLT (MISCELLANEOUS) ×1 IMPLANT
GAUZE PAD ABD 7.5X8 STRL (GAUZE/BANDAGES/DRESSINGS) IMPLANT
GAUZE PAD ABD 8X10 STRL (GAUZE/BANDAGES/DRESSINGS) IMPLANT
GAUZE SPONGE 4X4 12PLY STRL (GAUZE/BANDAGES/DRESSINGS) IMPLANT
GLOVE BIOGEL PI IND STRL 7.0 (GLOVE) ×1 IMPLANT
GLOVE SURG SS PI 7.0 STRL IVOR (GLOVE) ×1 IMPLANT
GOWN STRL REUS W/ TWL LRG LVL3 (GOWN DISPOSABLE) ×1 IMPLANT
GOWN STRL REUS W/ TWL XL LVL3 (GOWN DISPOSABLE) IMPLANT
GOWN STRL REUS W/TWL LRG LVL3 (GOWN DISPOSABLE) ×1
GOWN STRL REUS W/TWL XL LVL3 (GOWN DISPOSABLE)
KIT BASIN OR (CUSTOM PROCEDURE TRAY) ×1 IMPLANT
KIT TURNOVER KIT A (KITS) IMPLANT
PACK GENERAL/GYN (CUSTOM PROCEDURE TRAY) ×1 IMPLANT
SPIKE FLUID TRANSFER (MISCELLANEOUS) IMPLANT
SURGILUBE 2OZ TUBE FLIPTOP (MISCELLANEOUS) IMPLANT
SUT MNCRL AB 4-0 PS2 18 (SUTURE) IMPLANT
SWAB COLLECTION DEVICE MRSA (MISCELLANEOUS) IMPLANT
SWAB CULTURE ESWAB REG 1ML (MISCELLANEOUS) IMPLANT
TAPE PAPER 3X10 WHT MICROPORE (GAUZE/BANDAGES/DRESSINGS) IMPLANT
TOWEL OR 17X26 10 PK STRL BLUE (TOWEL DISPOSABLE) ×1 IMPLANT
TOWEL OR NON WOVEN STRL DISP B (DISPOSABLE) ×1 IMPLANT

## 2022-04-22 NOTE — Progress Notes (Signed)
  Progress Note   Patient: Tammy Garrison YYF:[id number] DOB: 1991/06/30 DOA: 04/20/2022     2 DOS: the patient was seen and examined on 04/22/2022 at 12:15PM      Brief hospital course: Tammy Garrison is a 31 y.o. 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     9/18: Admitted on IV antibiotics, Gen Surg consulted 9/19: Granix given, transfused 1u PRBC 9/20: To OR for I&D     Assessment and Plan: * Abscess of right axilla - Continue vancomycin and cefepime - Follow abscess culture - Consult Gen Surg, appreciate cares     Pancytopenia (Tammy Garrison) Granix and PRBC transfusion 9/19.  Counts unchanged today.  Hgb baseline 10 2 weeks prior to admission.   - Consult Hematology  Obesity (BMI 30-39.9) BMI 35  Anal squamous cell carcinoma (HCC) Rectal pain is her chief complaint.  Dr. Burr Medico has seen the patient in the hospital. - Lidocaine Jelly topical - oxycodone PRN           Subjective: Patient is having severe pain in her rectum.  No fever in the last 24 hours, no confusion, no respiratory symptoms, no bleeding.     Physical Exam: BP (!) 134/92   Pulse 99   Temp 98.7 F (37.1 C) (Oral)   Resp 17   Ht 6' (1.829 m)   Wt 118.8 kg   LMP 03/29/2022   SpO2 100%   BMI 35.53 kg/m   Obese adult female, lying on her side, appears uncomfortable Slightly tachycardic, regular, no murmurs, no peripheral edema Respiratory rate normal, lungs clear without rales or wheezes No abdominal tenderness, no distention The surgical site is not reviewed given pain in her rectum Attention normal, affect blunted by pain, judgment and insight appear normal, oriented to person, place, time, speech fluent, face symmetric     Data Reviewed: Discussed with general surgery team Complete blood count shows hemoglobin 7.8, no change, white blood cells 2, no change, platelets 50, no  change Basic metabolic panel unremarkable INR normal Blood culture no growth to date  Family Communication: called to mother, no answer    Disposition: Status is: Inpatient         Author: Edwin Dada, MD 04/22/2022 12:54 PM  For on call review [email or web address].

## 2022-04-22 NOTE — Anesthesia Postprocedure Evaluation (Signed)
Anesthesia Post Note  Patient: Tammy Garrison  Procedure(s) Performed: INCISION AND DRAINAGE axilla (Right)     Patient location during evaluation: PACU Anesthesia Type: General Level of consciousness: awake and alert Pain management: pain level controlled Vital Signs Assessment: post-procedure vital signs reviewed and stable Respiratory status: spontaneous breathing, nonlabored ventilation, respiratory function stable and patient connected to nasal cannula oxygen Cardiovascular status: blood pressure returned to baseline and stable Postop Assessment: no apparent nausea or vomiting Anesthetic complications: no   No notable events documented.  Last Vitals:  Vitals:   04/22/22 1120 04/22/22 1125  BP:    Pulse: (!) 102 99  Resp: 10 14  Temp:    SpO2: 96% 95%    Last Pain:  Vitals:   04/22/22 1115  TempSrc:   PainSc: [place] Keelyn Monjaras

## 2022-04-22 NOTE — Progress Notes (Signed)
Pacu RN Report to floor given  Gave report to  [employer]. [id number]    Discussed surgery, meds given in OR and Pacu, VS, IV fluids given, EBL, urine output, pain and other pertinent information. Also discussed if pt had any family or friends here or belongings with them.   Discussed R axilla dressing left open, VSS, pain/meds given, pure wick in place, pt having rectal pain still, text Mom with updates.   Pt exits my care.

## 2022-04-22 NOTE — Progress Notes (Signed)
Pre Procedure note for inpatients:   Tammy Garrison has been scheduled for Procedure(s): INCISION AND DRAINAGE axilla (Right) today. The various methods of treatment have been discussed with the patient. After consideration of the risks, benefits and treatment options the patient has consented to the planned procedure.   The patient has been seen and labs reviewed. There are no changes in the patient's condition to prevent proceeding with the planned procedure today.  Recent labs:  Lab Results  Component Value Date   WBC 2.0 (L) 04/22/2022   HGB 7.8 (L) 04/22/2022   HCT 24.5 (L) 04/22/2022   PLT 50 (L) 04/22/2022   GLUCOSE 99 04/21/2022   ALT 37 04/20/2022   AST 32 04/20/2022   NA 138 04/21/2022   K 3.8 04/21/2022   CL 107 04/21/2022   CREATININE 0.70 04/21/2022   BUN 8 04/21/2022   CO2 21 (L) 04/21/2022   INR 1.0 04/22/2022    Mickeal Skinner, MD 04/22/2022 9:52 AM

## 2022-04-22 NOTE — Progress Notes (Signed)
Tammy Garrison   DOB:July 10, 1991   BH#:[id number]   OXB#:[id number]  Oncology follow up   Subjective: Pt is well-known to me, under my care for anal cancer. She was admitted for right axillary abscess, and underwent I&D by surgery today.    Objective:  Vitals:   04/22/22 1331 04/22/22 2107  BP: 123/85 124/75  Pulse: 100 (!) 110  Resp: 16 18  Temp: 98.7 F (37.1 C) 98.5 F (36.9 C)  SpO2: 98% 100%    Body mass index is 35.53 kg/m.  Intake/Output Summary (Last 24 hours) at 04/22/2022 2111 Last data filed at 04/22/2022 1823 Gross per 24 hour  Intake 981.3 ml  Output 1550 ml  Net -568.7 ml     Sclerae unicteric  (+) right axilla is covered by gauze   Lungs clear -- no rales or rhonchi  Heart regular rate and rhythm  Abdomen benign  MSK no focal spinal tenderness, no peripheral edema  Neuro nonfocal    CBG (last 3)  Recent Labs    04/21/22 2146  GLUCAP 115*     Labs:  Urine Studies No results for input(s): "UHGB", "CRYS" in the last 72 hours.  Invalid input(s): "UACOL", "UAPR", "USPG", "UPH", "UTP", "UGL", "UKET", "UBIL", "UNIT", "UROB", "ULEU", "UEPI", "UWBC", "URBC", "UBAC", "CAST", "UCOM", "BILUA"  Basic Metabolic Panel: Recent Labs  Lab 04/20/22 0932 04/20/22 1939 04/21/22 0540  NA 135 135 138  K 3.5 3.7 3.8  CL 103 104 107  CO2 21* 23 21*  GLUCOSE 123* 113* 99  BUN '9 10 8  '$ CREATININE 0.67 0.61 0.70  CALCIUM 9.3 9.1 9.2   GFR Estimated Creatinine Clearance: 149.7 mL/min (by C-G formula based on SCr of 0.7 mg/dL). Liver Function Tests: Recent Labs  Lab 04/20/22 0932 04/20/22 1939  AST 16 32  ALT 23 37  ALKPHOS 74 73  BILITOT 0.4 0.4  PROT 7.5 7.7  ALBUMIN 3.8 3.4*   Recent Labs  Lab 04/20/22 1939  LIPASE 23   No results for input(s): "AMMONIA" in the last 168 hours. Coagulation profile Recent Labs  Lab 04/22/22 0549  INR 1.0    CBC: Recent Labs  Lab 04/20/22 0932 04/20/22 1939 04/21/22 0540 04/21/22 1344  04/22/22 0808  WBC 1.7* 1.5* 1.4*  --  2.0*  NEUTROABS 1.4* 1.2* 1.0*  --   --   HGB 7.3* 7.4* 6.8* 8.1* 7.8*  HCT 22.0* 23.4* 21.9* 25.2* 24.5*  MCV 81.5 84.8 85.2  --  88.1  PLT 48* 54* 51*  --  50*   Cardiac Enzymes: No results for input(s): "CKTOTAL", "CKMB", "CKMBINDEX", "TROPONINI" in the last 168 hours. BNP: Invalid input(s): "POCBNP" CBG: Recent Labs  Lab 04/21/22 2146  GLUCAP 115*   D-Dimer No results for input(s): "DDIMER" in the last 72 hours. Hgb A1c No results for input(s): "HGBA1C" in the last 72 hours. Lipid Profile No results for input(s): "CHOL", "HDL", "LDLCALC", "TRIG", "CHOLHDL", "LDLDIRECT" in the last 72 hours. Thyroid function studies No results for input(s): "TSH", "T4TOTAL", "T3FREE", "THYROIDAB" in the last 72 hours.  Invalid input(s): "FREET3" Anemia work up No results for input(s): "VITAMINB12", "FOLATE", "FERRITIN", "TIBC", "IRON", "RETICCTPCT" in the last 72 hours. Microbiology Recent Results (from the past 240 hour(s))  Blood culture (routine x 2)     Status: None (Preliminary result)   Collection Time: 04/20/22 10:26 PM   Specimen: BLOOD  Result Value Ref Range Status   Specimen Description   Final    BLOOD LEFT ANTECUBITAL Performed at [facility]  [place] [address], [place], [place] [address]    Special Requests   Final    BLOOD Blood Culture results may not be optimal due to an excessive volume of blood received in culture bottles Performed at [place] [address], [place], [place] [address]    Culture   Final    NO GROWTH 1 DAY Performed at [facility], [place] [address] [place], [place] [address]    Report Status PENDING  Incomplete  Blood culture (routine x 2)     Status: None (Preliminary result)   Collection Time: 04/20/22 10:31 PM   Specimen: BLOOD  Result Value Ref Range Status   Specimen Description   Final    BLOOD BLOOD RIGHT ARM Performed at [facility] [address] [place], [place] [address]    Special Requests   Final    BLOOD Blood Culture adequate volume Performed at [facility] [address], [place], [place] [address]    Culture   Final    NO GROWTH 1 DAY Performed at [facility], [place] [address], [place], [place] [address]    Report Status PENDING  Incomplete      Studies:  No results found.  Assessment: 31 y.o. 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 -She has completed chemo and radiation, we started her on filgrastim yesterday for neutropenia, WBC improved today, OK to stop when ANC>1.5k. she got one dose last night, will give second dose tonight -I will f/u as needed   Truitt Merle, MD 04/22/2022

## 2022-04-22 NOTE — Anesthesia Procedure Notes (Signed)
Procedure Name: LMA Insertion Date/Time: 04/22/2022 10:10 AM  Performed by: Lind Covert, CRNAPre-anesthesia Checklist: Patient identified Patient Re-evaluated:Patient Re-evaluated prior to induction Oxygen Delivery Method: Circle system utilized Preoxygenation: Pre-oxygenation with 100% oxygen Induction Type: IV induction LMA: LMA inserted LMA Size: 4.0 Tube type: Oral Number of attempts: 1 Placement Confirmation: positive ETCO2 and breath sounds checked- equal and bilateral Tube secured with: Tape Dental Injury: Teeth and Oropharynx as per pre-operative assessment

## 2022-04-22 NOTE — Transfer of Care (Signed)
Immediate Anesthesia Transfer of Care Note  Patient: Tammy Garrison  Procedure(s) Performed: INCISION AND DRAINAGE axilla (Right)  Patient Location: PACU  Anesthesia Type:General  Level of Consciousness: sedated  Airway & Oxygen Therapy: Patient Spontanous Breathing and Patient connected to face mask oxygen  Post-op Assessment: Report given to RN and Post -op Vital signs reviewed and stable  Post vital signs: Reviewed and stable  Last Vitals:  Vitals Value Taken Time  BP    Temp    Pulse 113 04/22/22 1043  Resp 33 04/22/22 1043  SpO2 95 % 04/22/22 1043  Vitals shown include unvalidated device data.  Last Pain:  Vitals:   04/22/22 0934  TempSrc:   PainSc: 7       Patients Stated Pain Goal: 3 (10/01/29 4388)  Complications: No notable events documented.

## 2022-04-22 NOTE — Assessment & Plan Note (Signed)
BMI 35 

## 2022-04-22 NOTE — Op Note (Signed)
Preoperative diagnosis: right axilla abscess  Postoperative diagnosis: same   Procedure: incision and drainage of right axilla abscess  Surgeon: Gurney Maxin, M.D.  Asst: none  Anesthesia: LMA  Indications for procedure: Tammy Garrison is a 31 y.o. year old female with symptoms of pain and swelling and drainage in her right axilla.  Description of procedure: The patient was brought into the operative suite. Anesthesia was administered with General LMA anesthesia. WHO checklist was applied. The patient was then placed in supine position. The area was prepped and draped in the usual sterile fashion.  Next, an ellipitcal incision was made at the site of abscess. A small amount of purulence was drained out. The area was cauterized for hemostasis. The wound was packed with 2 in kerlex. The patient tolerated the procedure well and was transported to PACU in stable condition  Findings: right axillary abscess  Specimen: culture of right axillary abscess  Implant: 2 in kerlex packing   Blood loss: 20 ml  Local anesthesia:  20 ml marcaine   Complications: none  Gurney Maxin, M.D. General, Bariatric, & Minimally Invasive Surgery [facility]

## 2022-04-22 NOTE — Anesthesia Preprocedure Evaluation (Addendum)
Anesthesia Evaluation  Patient identified by MRN, date of birth, ID band Patient awake    Reviewed: Allergy & Precautions, NPO status , Patient's Chart, lab work & pertinent test results  Airway Mallampati: III  TM Distance: >3 FB Neck ROM: Full    Dental  (+) Teeth Intact, Dental Advisory Given   Pulmonary Current Smoker and Patient abstained from smoking.,    Pulmonary exam normal breath sounds clear to auscultation       Cardiovascular negative cardio ROS   Rhythm:Regular Rate:Tachycardia     Neuro/Psych PSYCHIATRIC DISORDERS Depression Bipolar Disorder negative neurological ROS     GI/Hepatic (+)     substance abuse  alcohol use and marijuana use,  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  Reproductive/Obstetrics                            Anesthesia Physical Anesthesia Plan  ASA: 3  Anesthesia Plan: General   Post-op Pain Management: Ofirmev IV (intra-op)*   Induction: Intravenous  PONV Risk Score and Plan: 3 and Midazolam, Dexamethasone and Ondansetron  Airway Management Planned: LMA  Additional Equipment:   Intra-op Plan:   Post-operative Plan: Extubation in OR  Informed Consent: I have reviewed the patients History and Physical, chart, labs and discussed the procedure including the risks, benefits and alternatives for the proposed anesthesia with the patient or authorized representative who has indicated his/her understanding and acceptance.     Dental advisory given  Plan Discussed with: CRNA  Anesthesia Plan Comments:        Anesthesia Quick Evaluation

## 2022-04-23 ENCOUNTER — Encounter: Payer: No Typology Code available for payment source | Admitting: Physical Therapy

## 2022-04-23 ENCOUNTER — Encounter (HOSPITAL_COMMUNITY): Payer: Self-pay | Admitting: General Surgery

## 2022-04-23 DIAGNOSIS — D219 Benign neoplasm of connective and other soft tissue, unspecified: Secondary | ICD-10-CM

## 2022-04-23 LAB — CBC WITH DIFFERENTIAL/PLATELET
Abs Immature Granulocytes: 0.17 10*3/uL — ABNORMAL HIGH (ref 0.00–0.07)
Basophils Absolute: 0 10*3/uL (ref 0.0–0.1)
Basophils Relative: 0 %
Eosinophils Absolute: 0 10*3/uL (ref 0.0–0.5)
Eosinophils Relative: 0 %
HCT: 25.1 % — ABNORMAL LOW (ref 36.0–46.0)
Hemoglobin: 8.1 g/dL — ABNORMAL LOW (ref 12.0–15.0)
Immature Granulocytes: 4 %
Lymphocytes Relative: 6 %
Lymphs Abs: 0.2 10*3/uL — ABNORMAL LOW (ref 0.7–4.0)
MCH: 28.6 pg (ref 26.0–34.0)
MCHC: 32.3 g/dL (ref 30.0–36.0)
MCV: 88.7 fL (ref 80.0–100.0)
Monocytes Absolute: 0.4 10*3/uL (ref 0.1–1.0)
Monocytes Relative: 9 %
Neutro Abs: 3.3 10*3/uL (ref 1.7–7.7)
Neutrophils Relative %: 81 %
Platelets: 57 10*3/uL — ABNORMAL LOW (ref 150–400)
RBC: 2.83 MIL/uL — ABNORMAL LOW (ref 3.87–5.11)
WBC: 4.1 10*3/uL (ref 4.0–10.5)
nRBC: 0 % (ref 0.0–0.2)

## 2022-04-23 LAB — COMPREHENSIVE METABOLIC PANEL
ALT: 32 U/L (ref 0–44)
AST: 14 U/L — ABNORMAL LOW (ref 15–41)
Albumin: 3.1 g/dL — ABNORMAL LOW (ref 3.5–5.0)
Alkaline Phosphatase: 65 U/L (ref 38–126)
Anion gap: 5 (ref 5–15)
BUN: 9 mg/dL (ref 6–20)
CO2: 23 mmol/L (ref 22–32)
Calcium: 9 mg/dL (ref 8.9–10.3)
Chloride: 112 mmol/L — ABNORMAL HIGH (ref 98–111)
Creatinine, Ser: 0.68 mg/dL (ref 0.44–1.00)
GFR, Estimated: >60 mL/min (ref >60)
Glucose, Bld: 127 mg/dL — ABNORMAL HIGH (ref 70–99)
Potassium: 3.7 mmol/L (ref 3.5–5.1)
Sodium: 140 mmol/L (ref 135–145)
Total Bilirubin: 0.5 mg/dL (ref 0.3–1.2)
Total Protein: 7 g/dL (ref 6.5–8.1)

## 2022-04-23 LAB — CYTOLOGY - PAP
Comment: NEGATIVE
Diagnosis: UNDETERMINED — AB
High risk HPV: NEGATIVE

## 2022-04-23 MED ORDER — OXYCODONE HCL 5 MG PO TABS
5.0000 mg | ORAL_TABLET | ORAL | Status: DC | PRN
  Administered 2022-04-23 – 2022-04-25 (×7): 10 mg via ORAL
  Filled 2022-04-23 (×8): qty 2

## 2022-04-23 MED ORDER — HYDROXYZINE HCL 25 MG PO TABS: 50.0000 mg | ORAL_TABLET | Freq: Every day | ORAL | Status: DC

## 2022-04-23 MED ORDER — MEGESTROL ACETATE 40 MG PO TABS
40.0000 mg | ORAL_TABLET | Freq: Two times a day (BID) | ORAL | Status: DC
  Administered 2022-04-23 – 2022-04-25 (×4): 40 mg via ORAL
  Filled 2022-04-23 (×4): qty 1

## 2022-04-23 MED ORDER — SENNOSIDES-DOCUSATE SODIUM 8.6-50 MG PO TABS
2.0000 | ORAL_TABLET | Freq: Two times a day (BID) | ORAL | Status: DC
  Administered 2022-04-23 – 2022-04-25 (×5): 2 via ORAL
  Filled 2022-04-23 (×5): qty 2

## 2022-04-23 MED ORDER — POLYETHYLENE GLYCOL 3350 17 G PO PACK
17.0000 g | PACK | Freq: Every day | ORAL | Status: DC
  Administered 2022-04-23 – 2022-04-25 (×3): 17 g via ORAL
  Filled 2022-04-23 (×2): qty 1

## 2022-04-23 MED ORDER — LIDOCAINE HCL URETHRAL/MUCOSAL 2 % EX GEL
1.0000 | Freq: Once | CUTANEOUS | Status: DC
  Filled 2022-04-23: qty 5

## 2022-04-23 NOTE — Progress Notes (Signed)
[facility] Progress Note  1 Day Post-Op  Subjective: CC:  Overall feels better. Having R axillary soreness. Reports some streaks of BRBPR as well as small clots. States her roommate and best friend can probably help with wound care.  Objective: Vital signs in last 24 hours: Temp:  [97.4 F (36.3 C)-98.7 F (37.1 C)] 98.3 F (36.8 C) (09/21 0510) Pulse Rate:  [96-117] 96 (09/21 0510) Resp:  [10-22] 15 (09/21 0510) BP: (102-147)/(57-93) 113/64 (09/21 0510) SpO2:  [95 %-100 %] 100 % (09/21 0510) Weight:  [706.2 kg] 118.8 kg (09/20 0934) Last BM Date : 04/22/22  Intake/Output from previous day: 09/20 0701 - 09/21 0700 In: 840 [P.O.:240; I.V.:600] Out: 1550 [Urine:1550] Intake/Output this shift: No intake/output data recorded.  PE: Gen:  Alert, NAD, pleasant Card:  Regular rate and rhythm, pedal pulses 2+ BL Pulm:  Normal effort Abd: Soft, non-tender, non-distended Skin: warm and dry, no rashes  R axilla - packing removed. Wound ~ 4x3x6 cm. Wound base clean and pink. Periwound is soft and without cellulitis. Repacked with moistened gauze. Psych: A&Ox3   Lab Results:  Recent Labs    04/22/22 0808 04/23/22 0552  WBC 2.0* 4.1  HGB 7.8* 8.1*  HCT 24.5* 25.1*  PLT 50* 57*   BMET Recent Labs    04/21/22 0540 04/23/22 0552  NA 138 140  K 3.8 3.7  CL 107 112*  CO2 21* 23  GLUCOSE 99 127*  BUN 8 9  CREATININE 0.70 0.68  CALCIUM 9.2 9.0   PT/INR Recent Labs    04/22/22 0549  LABPROT 13.4  INR 1.0   CMP     Component Value Date/Time   NA 140 04/23/2022 0552   K 3.7 04/23/2022 0552   CL 112 (H) 04/23/2022 0552   CO2 23 04/23/2022 0552   GLUCOSE 127 (H) 04/23/2022 0552   BUN 9 04/23/2022 0552   CREATININE 0.68 04/23/2022 0552   CREATININE 0.52 03/30/2022 0943   CALCIUM 9.0 04/23/2022 0552   PROT 7.0 04/23/2022 0552   ALBUMIN 3.1 (L) 04/23/2022 0552   AST 14 (L) 04/23/2022 0552   AST 26 03/30/2022 0943   ALT 32 04/23/2022 0552   ALT 42  03/30/2022 0943   ALKPHOS 65 04/23/2022 0552   BILITOT 0.5 04/23/2022 0552   BILITOT 0.6 03/30/2022 0943   GFRNONAA >60 04/23/2022 0552   GFRNONAA >60 03/30/2022 0943   GFRAA >60 04/12/2018 1520   Lipase     Component Value Date/Time   LIPASE 23 04/20/2022 1939       Studies/Results: No results found.  Anti-infectives: Anti-infectives (From admission, onward)    Start     Dose/Rate Route Frequency Ordered Stop   04/21/22 1400  vancomycin (VANCOREADY) IVPB 1500 mg/300 mL        1,500 mg 150 mL/hr over 120 Minutes Intravenous Every 12 hours 04/21/22 0114     04/21/22 0115  vancomycin (VANCOREADY) IVPB 2000 mg/400 mL        2,000 mg 200 mL/hr over 120 Minutes Intravenous  Once 04/21/22 0021 04/21/22 0406   04/21/22 0015  ceFEPIme (MAXIPIME) 2 g in sodium chloride 0.9 % 100 mL IVPB        2 g 200 mL/hr over 30 Minutes Intravenous Every 8 hours 04/21/22 0000     04/20/22 2230  vancomycin (VANCOREADY) IVPB 2000 mg/400 mL  Status:  Discontinued        2,000 mg 200 mL/hr over 120 Minutes Intravenous  Once 04/20/22  2229 04/21/22 0000   04/20/22 2230  ceFEPIme (MAXIPIME) 2 g in sodium chloride 0.9 % 100 mL IVPB  Status:  Discontinued        2 g 200 mL/hr over 30 Minutes Intravenous  Once 04/20/22 2229 04/21/22 0000        Assessment/Plan  R axillary abscess S/p I&D 9/20 Dr. Kieth Brightly - wound clean, no further debridement/drainage needed.  - daily moist-to-dry dressing changes, wound care education to be performed by RN staff.  - we discussed pre-medication with PO oxy and tylenol tomorrow AM and having her roommate watch/assist with dressing change tomorrow AM.  Once she is comfortable with wound care she is stable for discharge from [facility] perspective. Would recommend a total of 7 days of antibiotics.  - If she would like a provider to evaluate her peri-anal region due to bleed/pain, I have offered to perform a rectal exam. she was not up for it today after dressing change but  would like me to look at it tomorrow so I will plan to do that.    LOS: 3 days   I reviewed nursing notes, hospitalist notes, last 24 h vitals and pain scores, last 48 h intake and output, last 24 h labs and trends, and last 24 h imaging results.    Obie Dredge, PA-C [facility] Please see Amion for pager number during day hours 7:00am-4:30pm

## 2022-04-23 NOTE — Assessment & Plan Note (Signed)
-   Resume megestrol

## 2022-04-23 NOTE — Progress Notes (Signed)
Tammy Garrison   DOB:10/03/1990   NI#:[id number]   FGH#:[id number]  Oncology follow up   Subjective: Pt feels better today, afebrile, no new complaints.   Objective:  Vitals:   04/23/22 1434 04/23/22 2025  BP: 135/84 118/74  Pulse: 97 (!) 110  Resp: 16 18  Temp: 98.7 F (37.1 C) 98.7 F (37.1 C)  SpO2: 100% 100%    Body mass index is 35.53 kg/m. No intake or output data in the 24 hours ending 04/23/22 2049    Sclerae unicteric  (+) right axilla is covered by gauze   Lungs clear -- no rales or rhonchi  Heart regular rate and rhythm  Abdomen benign  MSK no focal spinal tenderness, no peripheral edema  Neuro nonfocal    CBG (last 3)  Recent Labs    04/21/22 2146  GLUCAP 115*     Labs:  Urine Studies No results for input(s): "UHGB", "CRYS" in the last 72 hours.  Invalid input(s): "UACOL", "UAPR", "USPG", "UPH", "UTP", "UGL", "UKET", "UBIL", "UNIT", "UROB", "ULEU", "UEPI", "UWBC", "URBC", "UBAC", "CAST", "UCOM", "BILUA"  Basic Metabolic Panel: Recent Labs  Lab 04/20/22 0932 04/20/22 1939 04/21/22 0540 04/23/22 0552  NA 135 135 138 140  K 3.5 3.7 3.8 3.7  CL 103 104 107 112*  CO2 21* 23 21* 23  GLUCOSE 123* 113* 99 127*  BUN '9 10 8 9  '$ CREATININE 0.67 0.61 0.70 0.68  CALCIUM 9.3 9.1 9.2 9.0   GFR Estimated Creatinine Clearance: 149.7 mL/min (by C-G formula based on SCr of 0.68 mg/dL). Liver Function Tests: Recent Labs  Lab 04/20/22 0932 04/20/22 1939 04/23/22 0552  AST 16 32 14*  ALT 23 37 32  ALKPHOS 74 73 65  BILITOT 0.4 0.4 0.5  PROT 7.5 7.7 7.0  ALBUMIN 3.8 3.4* 3.1*   Recent Labs  Lab 04/20/22 1939  LIPASE 23   No results for input(s): "AMMONIA" in the last 168 hours. Coagulation profile Recent Labs  Lab 04/22/22 0549  INR 1.0    CBC: Recent Labs  Lab 04/20/22 0932 04/20/22 1939 04/21/22 0540 04/21/22 1344 04/22/22 0808 04/23/22 0552  WBC 1.7* 1.5* 1.4*  --  2.0* 4.1  NEUTROABS 1.4* 1.2* 1.0*  --   --  3.3  HGB  7.3* 7.4* 6.8* 8.1* 7.8* 8.1*  HCT 22.0* 23.4* 21.9* 25.2* 24.5* 25.1*  MCV 81.5 84.8 85.2  --  88.1 88.7  PLT 48* 54* 51*  --  50* 57*   Cardiac Enzymes: No results for input(s): "CKTOTAL", "CKMB", "CKMBINDEX", "TROPONINI" in the last 168 hours. BNP: Invalid input(s): "POCBNP" CBG: Recent Labs  Lab 04/21/22 2146  GLUCAP 115*   D-Dimer No results for input(s): "DDIMER" in the last 72 hours. Hgb A1c No results for input(s): "HGBA1C" in the last 72 hours. Lipid Profile No results for input(s): "CHOL", "HDL", "LDLCALC", "TRIG", "CHOLHDL", "LDLDIRECT" in the last 72 hours. Thyroid function studies No results for input(s): "TSH", "T4TOTAL", "T3FREE", "THYROIDAB" in the last 72 hours.  Invalid input(s): "FREET3" Anemia work up No results for input(s): "VITAMINB12", "FOLATE", "FERRITIN", "TIBC", "IRON", "RETICCTPCT" in the last 72 hours. Microbiology Recent Results (from the past 240 hour(s))  Blood culture (routine x 2)     Status: None (Preliminary result)   Collection Time: 04/20/22 10:26 PM   Specimen: BLOOD  Result Value Ref Range Status   Specimen Description   Final    BLOOD LEFT ANTECUBITAL Performed at [facility] [address] [place], [place] [address]  Special Requests   Final    BLOOD Blood Culture results may not be optimal due to an excessive volume of blood received in culture bottles Performed at [facility] [address] [place], [place] [address]    Culture   Final    NO GROWTH 2 DAYS Performed at [facility] [address], [place], [place] [address]    Report Status PENDING  Incomplete  Blood culture (routine x 2)     Status: None (Preliminary result)   Collection Time: 04/20/22 10:31 PM   Specimen: BLOOD  Result Value Ref Range Status   Specimen Description   Final    BLOOD BLOOD RIGHT ARM Performed at [facility] [address] [place], [place] [address]    Special  Requests   Final    BLOOD Blood Culture adequate volume Performed at [facility] [address], [place], [place] [address]    Culture   Final    NO GROWTH 2 DAYS Performed at [facility] [address], [place], [place] [address]    Report Status PENDING  Incomplete  Aerobic/Anaerobic Culture w Gram Stain (surgical/deep wound)     Status: None (Preliminary result)   Collection Time: 04/22/22 10:25 AM   Specimen: Abscess  Result Value Ref Range Status   Specimen Description   Final    ABSCESS  RT AXILLARY Performed at [facility] [address], [place], [place] [address]    Special Requests   Final    NONE Performed at [facility], [place] [address] [place], [place] [address]    Gram Stain NO WBC SEEN NO ORGANISMS SEEN   Final   Culture   Final    RARE PROTEUS MIRABILIS SUSCEPTIBILITIES TO FOLLOW Performed at [facility], [place] [address] [place], [place] [address]    Report Status PENDING  Incomplete      Studies:  No results found.  Assessment: 31 y.o. 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

## 2022-04-23 NOTE — Progress Notes (Signed)
  Progress Note   Patient: Tammy Garrison INO:[id number] DOB: 10/29/1990 DOA: 04/20/2022     3 DOS: the patient was seen and examined on 04/23/2022 at 11 AM      Brief hospital course: Ms Kanan is a 31 y.o. 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     9/18: Admitted on IV antibiotics, Gen Surg consulted 9/19: Granix given, transfused 1u PRBC 9/20: To OR for I&D     Assessment and Plan: * Abscess of right axilla Culture growing Proteus - Continue cefepime - Stop vancomycin - Follow-up abscess culture finalization - Consult Gen Surg, appreciate cares     Pancytopenia ([place]) Granix and PRBC transfusion 9/19.   Count slightly improved.   Hgb baseline 10 two weeks prior to admission.   - Consult Hematology, appreciate cares  Obesity (BMI 30-39.9) BMI 35  Anal squamous cell carcinoma (HCC) Rectal pain is her chief complaint.  This is improved - Contracted continue topical lidocaine - Increase laxatives - oxycodone PRN           Subjective: Patient's pain is relatively well controlled, no fever, no confusion, no respiratory symptoms.     Physical Exam: BP 135/84 (BP Location: Left Arm)   Pulse 97   Temp 98.7 F (37.1 C) (Oral)   Resp 16   Ht 6' (1.829 m)   Wt 118.8 kg   LMP 03/29/2022   SpO2 100%   BMI 35.53 kg/m   Adult female, lying in bed, interactive RRR, no murmurs, no peripheral edema Respiratory rate normal, lungs clear without rales or wheezes Abdomen soft without tenderness palpation or guarding, no ascites or distention    Data Reviewed: Discussed with general surgery team Basic metabolic panel unremarkable Hemogram shows stable anemia, platelets slightly up to 57,000 Abscess culture with Proteus        Disposition: Status is: Inpatient         Author: Edwin Dada, MD 04/23/2022 4:03  PM  For on call review [email or web address]

## 2022-04-24 LAB — CBC WITH DIFFERENTIAL/PLATELET
Abs Immature Granulocytes: 0.06 10*3/uL (ref 0.00–0.07)
Basophils Absolute: 0 10*3/uL (ref 0.0–0.1)
Basophils Relative: 0 %
Eosinophils Absolute: 0 10*3/uL (ref 0.0–0.5)
Eosinophils Relative: 0 %
HCT: 26.4 % — ABNORMAL LOW (ref 36.0–46.0)
Hemoglobin: 8.2 g/dL — ABNORMAL LOW (ref 12.0–15.0)
Immature Granulocytes: 1 %
Lymphocytes Relative: 4 %
Lymphs Abs: 0.2 10*3/uL — ABNORMAL LOW (ref 0.7–4.0)
MCH: 27.6 pg (ref 26.0–34.0)
MCHC: 31.1 g/dL (ref 30.0–36.0)
MCV: 88.9 fL (ref 80.0–100.0)
Monocytes Absolute: 0.5 10*3/uL (ref 0.1–1.0)
Monocytes Relative: 10 %
Neutro Abs: 4.1 10*3/uL (ref 1.7–7.7)
Neutrophils Relative %: 85 %
Platelets: 61 10*3/uL — ABNORMAL LOW (ref 150–400)
RBC: 2.97 MIL/uL — ABNORMAL LOW (ref 3.87–5.11)
WBC: 4.9 10*3/uL (ref 4.0–10.5)
nRBC: 0.4 % — ABNORMAL HIGH (ref 0.0–0.2)

## 2022-04-24 LAB — BPAM RBC
Blood Product Expiration Date: 202309242359
Blood Product Expiration Date: 202310042359
ISSUE DATE / TIME: 202309190540
Unit Type and Rh: 600
Unit Type and Rh: 6200

## 2022-04-24 LAB — TYPE AND SCREEN
ABO/RH(D): AB POS
Antibody Screen: NEGATIVE
Unit division: 0
Unit division: 0

## 2022-04-24 LAB — FERRITIN: Ferritin: 400 ng/mL — ABNORMAL HIGH (ref 11–307)

## 2022-04-24 MED ORDER — AMOXICILLIN 250 MG PO CAPS
500.0000 mg | ORAL_CAPSULE | Freq: Three times a day (TID) | ORAL | Status: DC
  Administered 2022-04-24 – 2022-04-25 (×3): 500 mg via ORAL
  Filled 2022-04-24 (×3): qty 2

## 2022-04-24 NOTE — Progress Notes (Signed)
[facility] Progress Note  2 Days Post-Op  Subjective: CC:  Reports a rough night due to perirectal pain. States she had a BM yesterday evening and they continued throughout the night causing significant discomfort. Pain started 2 weeks ago, worse with standing, relieved when sitting on the toilet. Associated with constipation - states prior to 2 weeks ago she was having 1-2 BMs daily that were not painful. Patient has been afraid to eat for 2 weeks for fear of having painful BMs. Reports small amt blood in her stools as well as passing blood clots vaginally in the last 24 hours. Denies dizziness, lightheadedness, or palpitations. Feels her fatigue is at her baseline.   Denies axillary pain. Plans for dressing change today with her roommate as well as her mother.  Objective: Vital signs in last 24 hours: Temp:  [98.2 F (36.8 C)-98.7 F (37.1 C)] 98.2 F (36.8 C) (09/22 0438) Pulse Rate:  [97-110] 109 (09/22 0438) Resp:  [16-18] 18 (09/22 0438) BP: (118-135)/(74-89) 135/89 (09/22 0438) SpO2:  [100 %] 100 % (09/22 0438) Last BM Date : 04/24/22  Intake/Output from previous day: No intake/output data recorded. Intake/Output this shift: No intake/output data recorded.  PE: Gen:  Alert, NAD, pleasant Card:  Regular rate and rhythm, pedal pulses 2+ BL Pulm:  Normal effort Abd: Soft, non-tender, non-distended GU: no obvious blood on external anal exam - unable to perform DRE due to significant pain. Skin: warm and dry, no rashes  R axilla - dressing c/d/i Psych: A&Ox3   Lab Results:  Recent Labs    04/23/22 0552 04/24/22 0611  WBC 4.1 4.9  HGB 8.1* 8.2*  HCT 25.1* 26.4*  PLT 57* 61*   BMET Recent Labs    04/23/22 0552  NA 140  K 3.7  CL 112*  CO2 23  GLUCOSE 127*  BUN 9  CREATININE 0.68  CALCIUM 9.0   PT/INR Recent Labs    04/22/22 0549  LABPROT 13.4  INR 1.0   CMP     Component Value Date/Time   NA 140 04/23/2022 0552   K 3.7 04/23/2022  0552   CL 112 (H) 04/23/2022 0552   CO2 23 04/23/2022 0552   GLUCOSE 127 (H) 04/23/2022 0552   BUN 9 04/23/2022 0552   CREATININE 0.68 04/23/2022 0552   CREATININE 0.52 03/30/2022 0943   CALCIUM 9.0 04/23/2022 0552   PROT 7.0 04/23/2022 0552   ALBUMIN 3.1 (L) 04/23/2022 0552   AST 14 (L) 04/23/2022 0552   AST 26 03/30/2022 0943   ALT 32 04/23/2022 0552   ALT 42 03/30/2022 0943   ALKPHOS 65 04/23/2022 0552   BILITOT 0.5 04/23/2022 0552   BILITOT 0.6 03/30/2022 0943   GFRNONAA >60 04/23/2022 0552   GFRNONAA >60 03/30/2022 0943   GFRAA >60 04/12/2018 1520   Lipase     Component Value Date/Time   LIPASE 23 04/20/2022 1939       Studies/Results: No results found.  Anti-infectives: Anti-infectives (From admission, onward)    Start     Dose/Rate Route Frequency Ordered Stop   04/21/22 1400  vancomycin (VANCOREADY) IVPB 1500 mg/300 mL  Status:  Discontinued        1,500 mg 150 mL/hr over 120 Minutes Intravenous Every 12 hours 04/21/22 0114 04/23/22 1323   04/21/22 0115  vancomycin (VANCOREADY) IVPB 2000 mg/400 mL        2,000 mg 200 mL/hr over 120 Minutes Intravenous  Once 04/21/22 0021 04/21/22 0406   04/21/22 0015  ceFEPIme (MAXIPIME) 2 g in sodium chloride 0.9 % 100 mL IVPB        2 g 200 mL/hr over 30 Minutes Intravenous Every 8 hours 04/21/22 0000     04/20/22 2230  vancomycin (VANCOREADY) IVPB 2000 mg/400 mL  Status:  Discontinued        2,000 mg 200 mL/hr over 120 Minutes Intravenous  Once 04/20/22 2229 04/21/22 0000   04/20/22 2230  ceFEPIme (MAXIPIME) 2 g in sodium chloride 0.9 % 100 mL IVPB  Status:  Discontinued        2 g 200 mL/hr over 30 Minutes Intravenous  Once 04/20/22 2229 04/21/22 0000        Assessment/Plan  R axillary abscess POD#2 S/p I&D 9/20 Dr. Kieth Brightly - wound clean, no further debridement/drainage needed.  - daily moist-to-dry dressing changes, wound care education to be performed by RN staff.  - patient having a lot of pain this AM.  Plans to take pain medications and let RN change dressing later this AM, with her friend and mother at bedside. Once she is comfortable with wound care she is stable for discharge from [facility] perspective. Would recommend a total of 7 days of antibiotics.   - peri-anal exam limited by patient discomfort. Her sxs are most consistent with radiation proctitis. Radiation oncology may have some good ideas for assisting with pain control/bleeding. I not not see carafate enemas or suppositories on formulary but this may help with bleeding/suspected rectal ulcerations.  - hgb stable this AM and pt is not having symptoms of anemia. Vaginal bleeding is chronic due to her fibroids.   LOS: 4 days   I reviewed nursing notes, hospitalist notes, last 24 h vitals and pain scores, last 48 h intake and output, last 24 h labs and trends, and last 24 h imaging results.    Obie Dredge, PA-C [facility] Please see Amion for pager number during day hours 7:00am-4:30pm

## 2022-04-24 NOTE — Progress Notes (Addendum)
  Progress Note   Patient: Tammy Garrison PPJ:[id number] DOB: [birth year] DOA: 04/20/2022     4 DOS: the patient was seen and examined on 04/24/2022 at 11 AM      Brief hospital course: Ms Habermehl is a 31 y.o. 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          Assessment and Plan: * Abscess of right axilla Oncology recommended keeping until tomorrow.  Culture growing proteus. 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    Discussed with Oncology. - Continue analgesic regimen          Subjective: Patient has severe rectal pain, anytime she is not sitting on the toilet.  She said no rectal bleeding.  Her armpit is improving, still slightly sore.  No fever.     Physical Exam: BP 126/76 (BP Location: Left Arm)   Pulse 96   Temp 98.7 F (37.1 C) (Oral)   Resp 16   Ht 6' (1.829 m)   Wt 118.8 kg   LMP 03/29/2022   SpO2 99%   BMI 35.53 kg/m   Adult female, lying on her side, watching television on her phone RRR, no murmurs, no peripheral edema Respiratory rate normal, lungs clear without rales or wheezes Attention normal, affect appropriate, judgment insight appear normal Her wound is packed with iodoform, has no surrounding redness or edema, it is slightly tender, but no induration      Data Reviewed: Discussed with oncology and pharmacy Hemoglobin 8.2 and no change, platelets slightly up to 61 K Ferritin 400       Disposition: Status is: Inpatient Oncology recommend keeping until tomorrow        Author: Edwin Dada, MD 04/24/2022 2:24 PM  For on call review [email or web address].

## 2022-04-24 NOTE — TOC Progression Note (Signed)
Transition of Care [facility]) - Progression Note    Patient Details  Name: Tammy Garrison MRN: [id number] Date of Birth: 21-May-1991  [facility] CM/SW Contact  Servando Snare, [place] Phone Number: 04/24/2022, 10:02 AM  Clinical Narrative:     Transition of Care (TOC) Screening Note   Patient Details  Name: Tammy Garrison Date of Birth: Sep 02, 1990   [facility]) CM/SW Contact:    Servando Snare, LCSW Phone Number: 04/24/2022, 10:02 AM    Transition of Care Department [facility]) has reviewed patient and no TOC needs have been identified at this time. We will continue to monitor patient advancement through interdisciplinary progression rounds. If new patient transition needs arise, please place a TOC consult.     Expected Discharge Plan: Home/Self Care Barriers to Discharge: Continued Medical Work up  Expected Discharge Plan and Services Expected Discharge Plan: Home/Self Care   Discharge Planning Services: CM Consult   Living arrangements for the past 2 months: Apartment                                       Social Determinants of Health (SDOH) Interventions    Readmission Risk Interventions     No data to display

## 2022-04-24 NOTE — Progress Notes (Signed)
Right axilla dressing changed, and witnessed by mother so she can do them at home. Patient tolerated well.

## 2022-04-25 ENCOUNTER — Other Ambulatory Visit (HOSPITAL_COMMUNITY): Payer: Self-pay

## 2022-04-25 ENCOUNTER — Encounter: Payer: Self-pay | Admitting: Hematology

## 2022-04-25 LAB — AEROBIC/ANAEROBIC CULTURE W GRAM STAIN (SURGICAL/DEEP WOUND): Gram Stain: NONE SEEN

## 2022-04-25 MED ORDER — OXYCODONE HCL 10 MG PO TABS
10.0000 mg | ORAL_TABLET | Freq: Four times a day (QID) | ORAL | 0 refills | Status: DC | PRN
  Filled 2022-04-25: qty 12, 3d supply, fill #0

## 2022-04-25 MED ORDER — REGENECARE HA 2 % EX GEL
1.0000 | Freq: Every day | CUTANEOUS | 0 refills | Status: DC
  Filled 2022-04-25: qty 85, 15d supply, fill #0

## 2022-04-25 MED ORDER — AMOXICILLIN 500 MG PO CAPS
500.0000 mg | ORAL_CAPSULE | Freq: Three times a day (TID) | ORAL | 0 refills | Status: AC
  Filled 2022-04-25: qty 21, 7d supply, fill #0

## 2022-04-25 NOTE — Progress Notes (Signed)
Provided patient with AVS discharge instructions, patient had no further questions upon discharge. Also changed her right axilla dressing, patient tolerated well.

## 2022-04-25 NOTE — Discharge Summary (Signed)
Physician Discharge Summary   Patient: Tammy Garrison MRN: [id number] DOB: [birth year]  Admit date:     04/20/2022  Discharge date: 04/25/22  Discharge Physician: Edwin Dada   PCP: Pcp, No     Recommendations at discharge:  Follow up with General Surgery Dr. Kieth Brightly as directed Follow up with Oncology, Dr. 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     9/18: Admitted on IV antibiotics, Gen Surg consulted 9/19: Granix given, transfused 1u PRBC 9/20: To OR for I&D   * Abscess of right axilla Admitted and started on IV antibiotics.  General Surgery consulted.    Taken to OR 9/20 for I&D, and intraoperative culture growing Proteus, pan-sensitive.  Transitioned to amoxicillin per ID and discharged to complete 7 more days.  Taught packing technique.  Has General Surgery follow up.     Pancytopenia ([place]) Given Granix and PRBC transfusion 9/19.  Counts stable afterwards.   Has close Oncology follow up.  Fibroids On megestrol  Obesity (BMI 30-39.9) BMI 35  Anal squamous cell carcinoma ([place]) Discharged with oxycodone 10 (hydrocodone not strong enough) and topical lidocaine.  Has Oncology follow up.             The [facility] Controlled Substances Registry was reviewed for this patient prior to discharge.  Consultants: Oncology, General Surgery Procedures performed:  Incision and drainage right axilla abscess Blood transfusion   Disposition: Home   DISCHARGE MEDICATION: Allergies as of 04/25/2022       Reactions   Coconut Flavor Itching, Rash   Itchy throat        Medication List      STOP taking these medications    acetaminophen-codeine 300-30 MG tablet Commonly known as: TYLENOL #3   amoxicillin-clavulanate 875-125 MG tablet Commonly known as: AUGMENTIN   dexamethasone 4 MG tablet Commonly known as: DECADRON   HYDROcodone-acetaminophen 5-325 MG tablet Commonly known as: NORCO/VICODIN   ondansetron 8 MG disintegrating tablet Commonly known as: ZOFRAN-ODT   Premarin 1.25 MG tablet Generic drug: estrogens (conjugated)       TAKE these medications    amoxicillin 500 MG capsule Commonly known as: AMOXIL Take 1 capsule (500 mg total) by mouth every 8 (eight) hours for 7 days.   IMODIUM PO Take 2 tablets by mouth daily as needed (diarrhea).   LORazepam 0.5 MG tablet Commonly known as: ATIVAN Take 1 tablet (0.5 mg total) by mouth every 6 (six) hours as needed (nausea). What changed:  when to take this reasons to take this   megestrol 40 MG tablet Commonly known as: MEGACE Take 1 tablet (40 mg total) by mouth 2 (two) times daily. What changed: when to take this   Oxycodone HCl 10 MG Tabs Take 1 tablet (10 mg total) by mouth every 6 (six) hours as needed for severe pain. What changed:  medication strength how much to take when to take this   prochlorperazine 10 MG tablet Commonly known as: COMPAZINE Take 10 mg by mouth 2 (two) times daily as needed for nausea or vomiting.   promethazine 25 MG tablet Commonly known as: PHENERGAN  Take 1 tablet (25 mg total) by mouth every 6 (six) hours as needed for nausea or vomiting. What changed: when to take this   Regenecare HA 2 % jelly Generic drug: lidocaine Apply 1 Application topically daily.   TUMS PO Take 2-3 tablets by mouth daily as needed (GI upset).               Discharge Care Instructions  (From admission, onward)           Start     Ordered   04/25/22 0000  Discharge wound care:       Comments: Exactly as instructed by Dr. Kieth Brightly and surgical team   04/25/22 [phone]             Follow-up Information     Truitt Merle, MD Follow up.   Specialties: Hematology, Oncology Contact information: [place] [address] [phone]         Kinsinger, Arta Bruce, MD Follow up.   Specialty: General Surgery Contact information: [place] [address] [phone]                 Discharge Instructions     Discharge instructions   Complete by: As directed    From Dr. Loleta Books: You were admitted for a right axilla wound. You were treated with antibiotics You had a surgery to drain the wound (called an "incision and drainage") Cultures were taken from the pus, and they were growing a common kind of bacteria which is killed by amoxicillin  For the rectal pain, use your pain medicine pills. Also, use the lidocaine jelly, once or at most twice daily, in a small amount (just a finger application)\  Take the antibiotic amoxicillin three times dialy for the next week Pack the wound as shown by General Surgery  Follow up with Dr. Burr Medico Follow up with Dr. Kieth Brightly from Surgery   Discharge wound care:   Complete by: As directed    Exactly as instructed by Dr. Kieth Brightly and surgical team   Increase activity slowly   Complete by: As directed        Discharge Exam: Filed Weights   04/20/22 1921 04/22/22 0934  Weight: 118.8 kg 118.8 kg    General: Pt is alert, awake, not in acute distress Cardiovascular: RRR, nl S1-S2, no murmurs appreciated.   No LE edema.   Respiratory: Normal respiratory rate and rhythm.  CTAB without rales or wheezes. Abdominal: Abdomen soft and non-tender.  No distension or HSM.   Neuro/Psych: Strength symmetric in upper and lower extremities.  Judgment and insight appear normal.   Condition at discharge: good  The results of significant diagnostics from this hospitalization (including imaging, microbiology, ancillary and laboratory) are listed below for reference.    Imaging Studies: DG Chest 2 View  Result Date: 04/20/2022 CLINICAL DATA:  Rectal bleeding, tachycardia, rectal cancer EXAM: CHEST - 2 VIEW COMPARISON:  08/14/2020 FINDINGS: Frontal and lateral views of the chest demonstrate an unremarkable cardiac silhouette. No airspace disease, effusion, or pneumothorax. No acute bony abnormalities. IMPRESSION: 1. No acute intrathoracic process. Electronically Signed   By: Randa Ngo M.D.   On: 04/20/2022 19:58   CT Angio Chest PE W and/or Wo Contrast  Result Date: 03/31/2022 CLINICAL DATA:  High probability for PE. Radiation today. Rectal squamous cell carcinoma. EXAM: CT ANGIOGRAPHY CHEST WITH CONTRAST TECHNIQUE: Multidetector CT imaging of the chest was performed using the standard protocol during bolus administration of  intravenous contrast. Multiplanar CT image reconstructions and MIPs were obtained to evaluate the vascular anatomy. RADIATION DOSE REDUCTION: This exam was performed according to the departmental dose-optimization program which includes automated exposure control, adjustment of the mA and/or kV according to patient size and/or use of iterative reconstruction technique. CONTRAST:  74m OMNIPAQUE IOHEXOL 350 MG/ML SOLN COMPARISON:  None Available. FINDINGS: Cardiovascular: Satisfactory opacification of the pulmonary arteries to the segmental level. No evidence of pulmonary embolism. Normal heart size. No pericardial effusion. Right-sided central venous catheter tip ends in the SVC. Mediastinum/Nodes: No enlarged mediastinal, hilar, or axillary lymph nodes. Thyroid gland, trachea, and esophagus demonstrate no significant findings. Lungs/Pleura: There is a calcified granuloma in the left lower lobe. The lungs are otherwise clear. There is no pleural effusion or pneumothorax. Upper Abdomen: No acute abnormality. Musculoskeletal: No chest wall abnormality. No acute or significant osseous findings. Review of the MIP images confirms the above findings.  IMPRESSION: 1. No evidence for pulmonary embolism. 2. No acute cardiopulmonary process. Electronically Signed   By: ARonney AstersM.D.   On: 03/31/2022 23:32   [place]orea[place]elvis Complete  Result Date: 03/31/2022 CLINICAL DATA:  Initial evaluation for acute vaginal bleeding with pelvic pain. EXAM: TRANSABDOMINAL AND TRANSVAGINAL ULTRASOUND OF PELVIS DOPPLER ULTRASOUND OF OVARIES TECHNIQUE: Both transabdominal and transvaginal ultrasound examinations of the pelvis were performed. Transabdominal technique was performed for global imaging of the pelvis including uterus, ovaries, adnexal regions, and pelvic cul-de-sac. It was necessary to proceed with endovaginal exam following the transabdominal exam to visualize the endometrium. Color and duplex Doppler ultrasound was utilized to evaluate blood flow to the ovaries. COMPARISON:  Prior study from 08/31/2013. FINDINGS: Uterus Measurements: 18.9 x 9.7 x 9.4 cm = volume: 894.2 mL. Uterus is anteverted with multiple fibroids seen as follows; 1. 8.4 x 8.3 x 8.4 cm intramural fibroid seen positioned at the right uterine fundus. 2. 5.8 x 3.2 x 4.7 cm intramural fibroid present at the mid posterior uterine body. 3. 3.7 x 3.3 x 4.0 cm intramural fibroid present at the posterior uterine fundus/body. 4. 3.8 x 3.0 x 3.3 cm exophytic fibroid present at the lower uterine segment. Endometrium Not visualized, obscured by overlying fibroids. Right ovary Measurements: 5.5 x 3.9 x 3.6 cm = volume: 39.6 mL. Normal appearance/no adnexal mass. Left ovary Not visualized.  No adnexal mass. Pulsed Doppler evaluation of the right ovary demonstrates normal low-resistance arterial and venous waveforms. Other findings No significant free fluid seen within the pelvis. IMPRESSION: 1. Enlarged fibroid uterus as detailed above. 2. Nonvisualization of the endometrial stripe, obscured by overlying fibroids. If there remains clinical concern for possible underlying occult endometrial pathology, further  evaluation with dedicated sonohysterography and/or pelvic MRI could be performed for further evaluation as warranted. 3. Normal sonographic appearance of the right ovary, with nonvisualization of the left ovary. No evidence for right ovarian torsion. No adnexal mass. Electronically Signed   By: BJeannine BogaM.D.   On: 03/31/2022 21:59   [place]oreaTransvaginal Non-OB  Result Date: 03/31/2022 CLINICAL DATA:  Initial evaluation for acute vaginal bleeding with pelvic pain. EXAM: TRANSABDOMINAL AND TRANSVAGINAL ULTRASOUND OF PELVIS DOPPLER ULTRASOUND OF OVARIES TECHNIQUE: Both transabdominal and transvaginal ultrasound examinations of the pelvis were performed. Transabdominal technique was performed for global imaging of the pelvis including uterus, ovaries, adnexal regions, and pelvic cul-de-sac. It was necessary to proceed with endovaginal exam following the transabdominal exam to visualize the endometrium. Color and duplex Doppler ultrasound was utilized to evaluate blood flow to the ovaries. COMPARISON:  Prior study from 08/31/2013. FINDINGS: Uterus Measurements: 18.9 x 9.7 x 9.4 cm = volume: 894.2 mL. Uterus is anteverted with multiple fibroids seen as follows; 1. 8.4 x 8.3 x 8.4 cm intramural fibroid seen positioned at the right uterine fundus. 2. 5.8 x 3.2 x 4.7 cm intramural fibroid present at the mid posterior uterine body. 3. 3.7 x 3.3 x 4.0 cm intramural fibroid present at the posterior uterine fundus/body. 4. 3.8 x 3.0 x 3.3 cm exophytic fibroid present at the lower uterine segment. Endometrium Not visualized, obscured by overlying fibroids. Right ovary Measurements: 5.5 x 3.9 x 3.6 cm = volume: 39.6 mL. Normal appearance/no adnexal mass. Left ovary Not visualized.  No adnexal mass. Pulsed Doppler evaluation of the right ovary demonstrates normal low-resistance arterial and venous waveforms. Other findings No significant free fluid seen within the pelvis. IMPRESSION: 1. Enlarged fibroid uterus as  detailed above. 2. Nonvisualization of the endometrial stripe, obscured by overlying fibroids. If there remains clinical concern for possible underlying occult endometrial pathology, further evaluation with dedicated sonohysterography and/or pelvic MRI could be performed for further evaluation as warranted. 3. Normal sonographic appearance of the right ovary, with nonvisualization of the left ovary. No evidence for right ovarian torsion. No adnexal mass. Electronically Signed   By: Jeannine Boga M.D.   On: 03/31/2022 21:59   [place] Art/Ven Flow Abd Pelv Doppler  Result Date: 03/31/2022 CLINICAL DATA:  Initial evaluation for acute vaginal bleeding with pelvic pain. EXAM: TRANSABDOMINAL AND TRANSVAGINAL ULTRASOUND OF PELVIS DOPPLER ULTRASOUND OF OVARIES TECHNIQUE: Both transabdominal and transvaginal ultrasound examinations of the pelvis were performed. Transabdominal technique was performed for global imaging of the pelvis including uterus, ovaries, adnexal regions, and pelvic cul-de-sac. It was necessary to proceed with endovaginal exam following the transabdominal exam to visualize the endometrium. Color and duplex Doppler ultrasound was utilized to evaluate blood flow to the ovaries. COMPARISON:  Prior study from 08/31/2013. FINDINGS: Uterus Measurements: 18.9 x 9.7 x 9.4 cm = volume: 894.2 mL. Uterus is anteverted with multiple fibroids seen as follows; 1. 8.4 x 8.3 x 8.4 cm intramural fibroid seen positioned at the right uterine fundus. 2. 5.8 x 3.2 x 4.7 cm intramural fibroid present at the mid posterior uterine body. 3. 3.7 x 3.3 x 4.0 cm intramural fibroid present at the posterior uterine fundus/body. 4. 3.8 x 3.0 x 3.3 cm exophytic fibroid present at the lower uterine segment. Endometrium Not visualized, obscured by overlying fibroids. Right ovary Measurements: 5.5 x 3.9 x 3.6 cm = volume: 39.6 mL. Normal appearance/no adnexal mass. Left ovary Not visualized.  No adnexal mass. Pulsed Doppler  evaluation of the right ovary demonstrates normal low-resistance arterial and venous waveforms. Other findings No significant free fluid seen within the pelvis. IMPRESSION: 1. Enlarged fibroid uterus as detailed above. 2. Nonvisualization of the endometrial stripe, obscured by overlying fibroids. If there remains clinical concern for possible underlying occult endometrial pathology, further evaluation with dedicated sonohysterography and/or pelvic MRI could be performed for further evaluation as warranted. 3. Normal sonographic appearance of the right ovary, with nonvisualization of the left ovary. No evidence for right ovarian torsion. No adnexal mass. Electronically Signed   By: Jeannine Boga M.D.   On: 03/31/2022 21:59    Microbiology: Results for orders placed or performed during the hospital encounter of 04/20/22  Blood culture (routine x 2)     Status: None (Preliminary result)   Collection Time: 04/20/22 10:26 PM   Specimen: BLOOD  Result Value Ref Range Status  Specimen Description   Final    BLOOD LEFT ANTECUBITAL Performed at [facility] [address], [place], [place] [address]    Special Requests   Final    BLOOD Blood Culture results may not be optimal due to an excessive volume of blood received in culture bottles Performed at [facility] [address], [place], [place] [address]    Culture   Final    NO GROWTH 4 DAYS Performed at [facility], [place] [address], [place], [place] [address]    Report Status PENDING  Incomplete  Blood culture (routine x 2)     Status: None (Preliminary result)   Collection Time: 04/20/22 10:31 PM   Specimen: BLOOD  Result Value Ref Range Status   Specimen Description   Final    BLOOD BLOOD RIGHT ARM Performed at [facility] [address], [place], [place] [address]    Special Requests   Final    BLOOD Blood Culture adequate volume Performed at [facility] [address], [place], [place] [address]    Culture   Final    NO GROWTH 4 DAYS Performed at [facility], [place] [address] [place], [place] [address]    Report Status PENDING  Incomplete  Aerobic/Anaerobic Culture w Gram Stain (surgical/deep wound)     Status: None (Preliminary result)   Collection Time: 04/22/22 10:25 AM   Specimen: Abscess  Result Value Ref Range Status   Specimen Description   Final    ABSCESS  RT AXILLARY Performed at [facility] [address] [place], [place] [address]    Special Requests   Final    NONE Performed at [facility], [place] [address], [place], [place] [address]    Gram Stain NO WBC SEEN NO ORGANISMS SEEN   Final   Culture   Final    RARE PROTEUS MIRABILIS HOLDING FOR POSSIBLE ANAEROBE Performed at [facility], [place] [address] [place],  [address]    Report Status PENDING  Incomplete   Organism ID, Bacteria PROTEUS MIRABILIS  Final      Susceptibility   Proteus mirabilis - MIC*    AMPICILLIN <=2 SENSITIVE Sensitive     CEFAZOLIN <=4 SENSITIVE Sensitive     CEFEPIME <=0.12 SENSITIVE Sensitive     CEFTAZIDIME <=1 SENSITIVE Sensitive     CEFTRIAXONE <=0.25 SENSITIVE Sensitive     CIPROFLOXACIN <=0.25 SENSITIVE Sensitive     GENTAMICIN <=1 SENSITIVE Sensitive     IMIPENEM 2 SENSITIVE Sensitive     TRIMETH/SULFA <=20 SENSITIVE Sensitive     AMPICILLIN/SULBACTAM <=2 SENSITIVE Sensitive     PIP/TAZO <=4 SENSITIVE Sensitive     * RARE PROTEUS MIRABILIS    Labs: CBC: Recent Labs  Lab 04/20/22 0932 04/20/22 1939 04/21/22 0540 04/21/22 1344 04/22/22 0808 04/23/22 0552 04/24/22 0611  WBC 1.7* 1.5* 1.4*  --  2.0* 4.1 4.9  NEUTROABS 1.4* 1.2* 1.0*  --   --  3.3 4.1  HGB 7.3* 7.4* 6.8* 8.1* 7.8* 8.1* 8.2*  HCT 22.0* 23.4* 21.9* 25.2* 24.5* 25.1* 26.4*  MCV 81.5 84.8 85.2  --  88.1 88.7 88.9  PLT 48* 54* 51*  --  50* 57* 61*   Basic Metabolic Panel: Recent Labs   Lab 04/20/22 0932 04/20/22 1939 04/21/22 0540 04/23/22 0552  NA 135 135 138 140  K 3.5 3.7 3.8 3.7  CL 103 104 107 112*  CO2 21* 23 21* 23  GLUCOSE 123* 113* 99 127*  BUN '9 10 8 9  '$ CREATININE 0.67 0.61 0.70 0.68  CALCIUM 9.3 9.1 9.2 9.0   Liver Function Tests: Recent Labs  Lab 04/20/22 0932 04/20/22 1939 04/23/22 0552  AST 16 32 14*  ALT 23 37 32  ALKPHOS 74 73 65  BILITOT 0.4 0.4 0.5  PROT 7.5 7.7 7.0  ALBUMIN 3.8 3.4* 3.1*   CBG: Recent Labs  Lab 04/21/22 2146  GLUCAP 115*    Discharge time spent: approximately 35 minutes spent on discharge counseling, evaluation of patient on day of discharge, and coordination of discharge planning with nursing, social work, pharmacy and case management  Signed: Edwin Dada, MD [facility] 04/25/2022

## 2022-04-26 LAB — CULTURE, BLOOD (ROUTINE X 2)
Culture: NO GROWTH
Culture: NO GROWTH
Special Requests: ADEQUATE

## 2022-04-27 ENCOUNTER — Other Ambulatory Visit (HOSPITAL_COMMUNITY): Payer: Self-pay

## 2022-04-28 ENCOUNTER — Other Ambulatory Visit (HOSPITAL_COMMUNITY): Payer: Self-pay

## 2022-04-30 ENCOUNTER — Encounter: Payer: No Typology Code available for payment source | Attending: Radiation Oncology | Admitting: Physical Therapy

## 2022-04-30 ENCOUNTER — Telehealth: Payer: Self-pay | Admitting: Physical Therapy

## 2022-04-30 NOTE — Telephone Encounter (Signed)
Called patient about her missed appointment today at 10:30. Left a message.  Earlie Counts, PT '@9'$ /28/2023@ 10:59 AM

## 2022-05-01 ENCOUNTER — Other Ambulatory Visit (HOSPITAL_COMMUNITY): Payer: Self-pay

## 2022-05-04 NOTE — Telephone Encounter (Signed)
Called patient and left message on 04/30/2022 Tammy Garrison, PT '@10'$ /09/2021@ 9:35 AM

## 2022-05-07 ENCOUNTER — Encounter: Payer: Self-pay | Admitting: Physical Therapy

## 2022-05-07 ENCOUNTER — Encounter: Payer: No Typology Code available for payment source | Attending: Radiation Oncology | Admitting: Physical Therapy

## 2022-05-07 DIAGNOSIS — R102 Pelvic and perineal pain: Secondary | ICD-10-CM | POA: Insufficient documentation

## 2022-05-07 DIAGNOSIS — M6281 Muscle weakness (generalized): Secondary | ICD-10-CM | POA: Insufficient documentation

## 2022-05-07 DIAGNOSIS — C210 Malignant neoplasm of anus, unspecified: Secondary | ICD-10-CM | POA: Insufficient documentation

## 2022-05-07 DIAGNOSIS — M25652 Stiffness of left hip, not elsewhere classified: Secondary | ICD-10-CM | POA: Insufficient documentation

## 2022-05-07 DIAGNOSIS — M25651 Stiffness of right hip, not elsewhere classified: Secondary | ICD-10-CM | POA: Insufficient documentation

## 2022-05-07 NOTE — Therapy (Signed)
OUTPATIENT PHYSICAL THERAPY TREATMENT NOTE   Patient Name: Tammy Garrison MRN: [id number] DOB:Nov 01, 1990, 31 y.o., female Today's Date: 05/07/2022  PCP:  None REFERRING PROVIDER: Hayden Pedro, PA-C  END OF SESSION:   PT End of Session - 05/07/22 1045     Visit Number 3    Date for PT Re-Evaluation 07/17/22    Authorization Type self pay    PT Start Time 1040    PT Stop Time 1125    PT Time Calculation (min) 45 min    Activity Tolerance Patient tolerated treatment well    Behavior During Therapy [facility] for tasks assessed/performed             Past Medical History:  Diagnosis Date   Bipolar 1 disorder ([place])    ETOH abuse    H/O self-harm    H/O suicide attempt    x 5 - last 05/2016 - overdose Ibuprofen   Marijuana abuse    Rheumatoid arthritis ([place])    Past Surgical History:  Procedure Laterality Date   HEMORRHOID SURGERY N/A 02/08/2022   Procedure: EXTERNAL AND INTERNAL HEMORRHOIDECTOMY;  Surgeon: Georganna Skeans, MD;  Location: [name];  Service: General;  Laterality: N/A;   INCISION AND DRAINAGE ABSCESS Right 04/22/2022   Procedure: INCISION AND DRAINAGE axilla;  Surgeon: Kieth Brightly, Arta Bruce, MD;  Location: [facility];  Service: General;  Laterality: Right;   Patient Active Problem List   Diagnosis Date Noted   Fibroids 04/23/2022   Obesity (BMI 30-39.9) 04/22/2022   Abscess of right axilla 04/20/2022   Pancytopenia ([place]) 04/20/2022   PICC (peripherally inserted central catheter) in place 03/14/2022   Dehydration 03/13/2022   Nausea with vomiting 03/13/2022   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   Rationale for Evaluation and Treatment Rehabilitation   ONSET DATE: 02/08/2022   SUBJECTIVE:                                                                                                                                                                                            SUBJECTIVE STATEMENT: I am seeing less blood in my stool. No blisters on the skin. I stopped radiation on 9/17. I am having  multiple bowel movements. When I urinate I will feel like I have to push a bowel movement out.    PAIN:  Are you having pain? Yes NPRS scale: 7/10 Pain location: Anterior, rectum   Pain type: burning and cramping Pain description: intermittent    Aggravating factors: difficulty with sitting, when she feels like she has to use the restroom.  Relieving factors: shower and lay on fan.    PRECAUTIONS: Other: anal cancer   WEIGHT BEARING RESTRICTIONS No   FALLS:  Has patient fallen in last 6 months? No   LIVING ENVIRONMENT: Lives with: lives alone   OCCUPATION: none   PLOF: Independent   PATIENT GOALS understand how to manage the pain, understand the changes the pelvis goes through and what to do   PERTINENT HISTORY:  Rheumatoid arthritis ([place]), anal cancer   BOWEL MOVEMENT Pain with bowel movement: Yes at level 7/10 Type of bowel movement:Type (Bristol Stool Scale) type 4 , Frequency every other day, and Strain Yes Fully empty rectum: No Leakage: yes, started 3 days ago 03/26/2022 Pads: No Fiber supplement: Yes: miralax 2x/day   URINATION Pain with urination: No Fully empty bladder: Yes:   Stream: Strong Urgency: Yes: not able to hold the urine Frequency: 1.5 to 2 hours Leakage: Urge to void, Walking to the bathroom, Coughing, Sneezing, and Bending forward Pads: No   INTERCOURSE Pain with intercourse: Initial Penetration and During Penetration Ability to have vaginal penetration:   Yes: clitorus is sensitive Climax: no Marinoff Scale: 1/3         OBJECTIVE:    DIAGNOSTIC FINDINGS:  none   PATIENT SURVEYS:  PFIQ-7 129; UIQ-7 43; CRAIQ-7 43; POPIQ-7 43   COGNITION:            Overall cognitive status: Within functional limits for tasks assessed                          SENSATION:            Light touch: Appears intact            Proprioception: Appears intact                  POSTURE: No Significant postural limitations               PELVIC ALIGNMENT:   LUMBARAROM/PROM   A/PROM A/PROM  eval  Flexion Decreased by 25%  Extension full  Right lateral flexion full  Left lateral flexion full  Right rotation full  Left rotation full   (Blank rows = not tested)   LOWER EXTREMITY ROM:   Passive ROM Right eval Left eval  Hip flexion 120 110  Hip extension 10 10  Hip abduction 20 25  Hip internal rotation 10 10  Hip external rotation 40 50   (Blank rows = not tested)   LOWER EXTREMITY MMT:   MMT Right eval Left eval  Hip flexion 4/5 4/5  Hip extension 4/5 4/5  Hip abduction 4/5 4/5  Hip internal rotation 4/5 4/5  Hip external rotation 4/5 4/5     PALPATION:   General                   External Perineal Exam tenderness located on the labia majora, labia minora, clitorus, and vulva  Internal Pelvic Floor tenderness located in the introitus, only placed index finger in 1/4 inch to assess the strength   Patient confirms identification and approves PT to assess internal pelvic floor and treatment Yes   PELVIC MMT:   MMT eval  Vaginal 2/5 and circular but no lift holding for 5 sec  Internal Anal Sphincter    External Anal Sphincter    Puborectalis    Diastasis Recti    (Blank rows = not tested)         TONE: Increased due to pain   PROLAPSE: None noted   TODAY'S TREATMENT  05/07/2022 Manual: Soft tissue mobilization: Scar tissue mobilization: Myofascial release: Spinal mobilization: Internal  pelvic floor techniques: Dry needling: Neuromuscular re-education: Core retraining: Core facilitation: Form correction: Pelvic floor contraction training:Pelvic floor contraction in sitting with not contracting her pelvic floor.  Down training: Exercises: Stretches/mobility:Quadruped cat cow Ardine Eng pose with pillows to help her in the position Quadruped hip sway.  Single knee to chest holding 30 sec. 2 times per day Piriformis stretch supine hold 30 sec each leg Hookly trunk rotation 5 times each side Hamstring stretch holding 15 sec bil.  Strengthening: Therapeutic activities: Functional strengthening activities: Self-care: Educated patient on using a cream around the anus after she has a bowel movement to reduce her pain.  Educated patient on how salads and vegetables can increase the frequency of bowel movements Educated patient to not use heat on the rectum due to radiated skin Educated patient on walking program and her goal is to walk to the circle K    03/26/2022 Manual: Soft tissue mobilization:circular massage to the abdomen to assist with peristalic motion of the intestines then educated patient on how to perform at home. Lifting the tissue off the suprapubic bone to release around the bladder and educated patient on how to perform at home.  Myofascial release:fascial release in the lower abdomen to reduce the restrictions and firmness around the bladder.  Exercises: Stretches/mobility:single knee to chest  bil. 15 sec                                     Supine figure 4 holding for 30 sec bil.                                      Supine hamstring stretch hold 30 sec                                      Supine trunk rotation bil. Hold 30 sec                                     Supine butterfly stretch hold 30 sec                                     Supine hip flexor stretch hold 30 sec bil.  Therapeutic activities: Functional strengthening activities:sit to stand with  trunk leaning forward  Reviewed toileting technique and contract 5 times after bowel movement Self care Educated patient to not drink 2 hours prior to bed time and elevate her legs 30 minutes prior to bed time to reduce the number of night time voids.     EVAL Date: 03/17/2022 HEP established-see below      PATIENT EDUCATION:  05/07/2022 Education details: Access Code: [email or web address]; using a cream around the rectum, walking program, diet, no heat around the rectum Person educated: Patient Education method: Explanation, Demonstration, Tactile cues, Verbal cues, and Handouts Education comprehension: verbalized understanding, returned demonstration, verbal cues required, tactile cues required, and needs further education     HOME EXERCISE PROGRAM: 05/07/2022  Access Code: [email or web address] URL: [email or web address] Date: 05/07/2022 Prepared by: Earlie Counts  Program Notes lay on back with pillows under kneesLay on side with pillows between knees and feet.   Exercises - Seated Pelvic Floor Contraction  - 3 x daily - 7 x weekly - 1 sets - 5 reps - 5 sec hold - Hooklying Single Knee to Chest Stretch  - 2 x daily - 7 x weekly - 1 sets - 2 reps - 1 sec hold - Supine Figure 4 Piriformis Stretch  - 2 x daily - 7 x weekly - 1 sets - 2 reps - 30 sec hold - Supine Hamstring Stretch  - 2 x daily - 7 x weekly - 1 sets - 2 reps - 30 sec hold - Supine Lower Trunk Rotation  - 2 x daily - 7 x weekly - 1 sets - 2 reps - 30 sec hold - Cat Cow  - 1 x daily - 7 x weekly - 3 sets - 10 reps - Supine Bridge  - 1 x daily - 7 x weekly - 1 sets - 10 reps  ASSESSMENT:   CLINICAL IMPRESSION: Patient is a 31 y.o. female who was seen today for physical therapy  treatment for anal cancer. Patient has stopped radiation 9/17. Her rectal pain is 7/10. She has learned hip stretches. Patient has learned ways to manage her rectal pain. Patient will bring a dilator in next visit to learn about  keeping the diameter of the vaginal canal.  Patient will benefit from skilled therapy to improve mobility, strength and how to manage the changes from radiation.      OBJECTIVE IMPAIRMENTS decreased activity tolerance, decreased coordination, decreased endurance, decreased ROM, decreased strength, increased fascial restrictions, and pain.    ACTIVITY LIMITATIONS sitting, standing, squatting, transfers, bed mobility, continence, toileting, and locomotion level   PARTICIPATION LIMITATIONS: cleaning, laundry, interpersonal relationship, and community activity   PERSONAL FACTORS Fitness, Past/current experiences, Time since onset of injury/illness/exacerbation, and 3+ comorbidities:    Rheumatoid arthritis ([place]), anal cancer with present chemotherapy and radiation treatment are also affecting patient's functional outcome.    REHAB POTENTIAL: Excellent   CLINICAL DECISION MAKING: Evolving/moderate complexity   EVALUATION COMPLEXITY: Moderate     GOALS: Goals reviewed with patient? Yes   SHORT TERM GOALS: Target date: 04/14/2022   Patient understands hip stretching program to reduce the tightness that happens from radiation.  Baseline: not educated yet Goal status: Met 03/26/2022   2.  Patient understands correct toileting technique to reduce pain with bowel movements.  Baseline: not educated yet Goal status: Met 03/26/2022   3.  Patient understands how to perform diaphragmatic breathing to elongate the pelvic floor for a bowel movement.  Baseline: not educated yet Goal status: INITIAL   4.  Patient understands the importance of a  walking program to manage endurance and balance.  Baseline: not educated yet Goal status: Met 05/06/2022   5.  Patient was educated on rectal health.  Baseline: not educated yet Goal status: Met 05/06/2022     LONG TERM GOALS: Target date: 07/17/2022    Patient independent with advanced HEP for core and hip strength.  Baseline: not educated yet Goal  status: INITIAL   2.  Pelvic floor strength >/= 3/5 to reduce her urinary leakage >/= 80%.  Baseline: strength is 2/5 and leaks with urge to void, walking to the bathroom, coughing, sneezing, and bending forward. Goal status: INITIAL   3.  Patient reports her pain with bowel movements decreased >/= 2/10 due to healing of the tissue and correct relaxation of the pelvic floor.  Baseline: pain level 6/10 Goal status: INITIAL   4.  Patient understands how to use the vaginal dilators to keep the opening of the introitus after radiation.  Baseline: not educated yet.  Goal status: INITIAL       PLAN: PT FREQUENCY: 1x/week   PT DURATION: other: for 4 months up to 12 visitis.    PLANNED INTERVENTIONS: Therapeutic exercises, Therapeutic activity, Neuromuscular re-education, Balance training, Patient/Family education, Self Care, Biofeedback, and Manual therapy   PLAN FOR NEXT SESSION: go over hip stretches, dilators, manual work on legs and buttocks   Earlie Counts, PT 05/07/22 11:34 AM

## 2022-05-08 ENCOUNTER — Telehealth: Payer: Self-pay | Admitting: Hematology

## 2022-05-08 ENCOUNTER — Other Ambulatory Visit: Payer: Self-pay

## 2022-05-08 ENCOUNTER — Other Ambulatory Visit (HOSPITAL_COMMUNITY): Payer: Self-pay

## 2022-05-08 NOTE — Telephone Encounter (Signed)
Left message with follow-up appointment per 9/18 los. 

## 2022-05-10 NOTE — ED Provider Notes (Signed)
.  Critical Care  Performed by: Wyvonnia Dusky, MD Authorized by: Wyvonnia Dusky, MD   Critical care provider statement:    Critical care time (minutes):  35   Critical care time was exclusive of:  Separately billable procedures and treating other patients   Critical care was necessary to treat or prevent imminent or life-threatening deterioration of the following conditions:  Circulatory failure   Critical care was time spent personally by me on the following activities:  Ordering and performing treatments and interventions, ordering and review of laboratory studies, ordering and review of radiographic studies, pulse oximetry, review of old charts, examination of patient and evaluation of patient's response to treatment   Care discussed with: admitting provider   Comments:     Blood transfusion for anemia     Wyvonnia Dusky, MD 05/10/22 1324

## 2022-05-11 ENCOUNTER — Other Ambulatory Visit: Payer: Self-pay

## 2022-05-11 DIAGNOSIS — C210 Malignant neoplasm of anus, unspecified: Secondary | ICD-10-CM

## 2022-05-11 DIAGNOSIS — D500 Iron deficiency anemia secondary to blood loss (chronic): Secondary | ICD-10-CM

## 2022-05-12 ENCOUNTER — Inpatient Hospital Stay: Payer: No Typology Code available for payment source

## 2022-05-12 ENCOUNTER — Inpatient Hospital Stay: Payer: No Typology Code available for payment source | Attending: Hematology | Admitting: Hematology

## 2022-05-12 ENCOUNTER — Encounter: Payer: Self-pay | Admitting: Hematology

## 2022-05-12 ENCOUNTER — Other Ambulatory Visit: Payer: Self-pay | Admitting: Hematology

## 2022-05-12 VITALS — BP 128/81 | HR 119 | Temp 98.6°F | Resp 18 | Ht 72.0 in | Wt 266.4 lb

## 2022-05-12 DIAGNOSIS — Z452 Encounter for adjustment and management of vascular access device: Secondary | ICD-10-CM

## 2022-05-12 DIAGNOSIS — D500 Iron deficiency anemia secondary to blood loss (chronic): Secondary | ICD-10-CM

## 2022-05-12 DIAGNOSIS — C210 Malignant neoplasm of anus, unspecified: Secondary | ICD-10-CM

## 2022-05-12 DIAGNOSIS — E860 Dehydration: Secondary | ICD-10-CM

## 2022-05-12 DIAGNOSIS — R112 Nausea with vomiting, unspecified: Secondary | ICD-10-CM

## 2022-05-12 DIAGNOSIS — Z923 Personal history of irradiation: Secondary | ICD-10-CM | POA: Insufficient documentation

## 2022-05-12 DIAGNOSIS — N920 Excessive and frequent menstruation with regular cycle: Secondary | ICD-10-CM | POA: Insufficient documentation

## 2022-05-12 LAB — CBC WITH DIFFERENTIAL/PLATELET
Abs Immature Granulocytes: 0.06 10*3/uL (ref 0.00–0.07)
Basophils Absolute: 0 10*3/uL (ref 0.0–0.1)
Basophils Relative: 0 %
Eosinophils Absolute: 0.1 10*3/uL (ref 0.0–0.5)
Eosinophils Relative: 1 %
HCT: 32.1 % — ABNORMAL LOW (ref 36.0–46.0)
Hemoglobin: 10.6 g/dL — ABNORMAL LOW (ref 12.0–15.0)
Immature Granulocytes: 1 %
Lymphocytes Relative: 4 %
Lymphs Abs: 0.3 10*3/uL — ABNORMAL LOW (ref 0.7–4.0)
MCH: 30.2 pg (ref 26.0–34.0)
MCHC: 33 g/dL (ref 30.0–36.0)
MCV: 91.5 fL (ref 80.0–100.0)
Monocytes Absolute: 0.7 10*3/uL (ref 0.1–1.0)
Monocytes Relative: 11 %
Neutro Abs: 5.6 10*3/uL (ref 1.7–7.7)
Neutrophils Relative %: 83 %
Platelets: 437 10*3/uL — ABNORMAL HIGH (ref 150–400)
RBC: 3.51 MIL/uL — ABNORMAL LOW (ref 3.87–5.11)
WBC: 6.8 10*3/uL (ref 4.0–10.5)
nRBC: 0 % (ref 0.0–0.2)

## 2022-05-12 LAB — COMPREHENSIVE METABOLIC PANEL
ALT: 11 U/L (ref 0–44)
AST: 13 U/L — ABNORMAL LOW (ref 15–41)
Albumin: 4 g/dL (ref 3.5–5.0)
Alkaline Phosphatase: 64 U/L (ref 38–126)
Anion gap: 7 (ref 5–15)
BUN: 7 mg/dL (ref 6–20)
CO2: 23 mmol/L (ref 22–32)
Calcium: 9.2 mg/dL (ref 8.9–10.3)
Chloride: 106 mmol/L (ref 98–111)
Creatinine, Ser: 0.72 mg/dL (ref 0.44–1.00)
GFR, Estimated: >60 mL/min (ref >60)
Glucose, Bld: 154 mg/dL — ABNORMAL HIGH (ref 70–99)
Potassium: 3.6 mmol/L (ref 3.5–5.1)
Sodium: 136 mmol/L (ref 135–145)
Total Bilirubin: 0.3 mg/dL (ref 0.3–1.2)
Total Protein: 7.6 g/dL (ref 6.5–8.1)

## 2022-05-12 MED ORDER — SODIUM CHLORIDE 0.9% FLUSH: 10.0000 mL | Freq: Once | INTRAVENOUS | Status: DC

## 2022-05-12 MED ORDER — HEPARIN SOD (PORK) LOCK FLUSH 100 UNIT/ML IV SOLN: 500.0000 [IU] | Freq: Once | INTRAVENOUS | Status: DC

## 2022-05-12 NOTE — Progress Notes (Signed)
[facility]   [phone] [phone]   Clinic Follow up Note   Patient Care Team: Pcp, No as PCP - General [name], MD (Rheumatology) Royston Sinner Colin Benton, MD as Consulting Physician (Obstetrics and Gynecology) Truitt Merle, MD as Consulting Physician (Hematology and Oncology)  Date of Service:  05/12/2022  CHIEF COMPLAINT: f/u of anal cancer  CURRENT THERAPY:  Surveillance   ASSESSMENT & PLAN:  Tammy Garrison is a 31 y.o. female with   1. Anal Cancer, cT2N0Mx, with hypermetabolic left axillary nodes, HPV(+) -presented with worsening rectal bleeding, began passing clots and was admitted 02/06/22 for anemia from blood loss. S/p hemorrhoidectomy on 02/08/22 by Dr. 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 -PET scan 02/26/22 showed: intense uptake to anus; no signs of solid organ or FDG-avid nodal metastasis within abdomen or pelvis, hypermetabolic left axillary lymph nodes.  Sequently left axillary lymph node biopsy was benign. -she established care with Dr. Dema Severin on 03/03/22. -she began concurrent chemoRT with mitomycin/5FU on 03/09/22. She tolerated moderately well -she completed radiation on 04/20/2022  -She is recovering well from chemo lesion -Plan to repeat a PET scan in mid December to evaluate her response to chemoradiation.   2. right axillary abcess -s/p surgical drainage, she completed her course of antibiotics -Doing much better, the wound is small and healing well.  3. Anemia, from vaginal bleeding and iron deficiency -low hgb dating back to 04/2018, prior to that in 2017 was WNL. She has heavy periods, requiring pad/tampon change every 30 minutes. -she has required IV Feraheme and pRBC. -Follow-up with gynecologist.  Her vaginal bleeding restarted this week.     PLAN:  -Lab and follow-up in 1 month -Today's labs still  pending, I will call her if needed.    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 SOFT TISSUE, ANTERIOR MASS VS. HEMORRHOID, EXCISION:  Invasive squamous cell carcinoma, focally keratinizing and  well-differentiated.  Carcinoma is present at the margins of excision.   B. HEMORRHOID, INTERNAL, EXCISION:  Hemorrhoid without the overlying epithelium.  Separate fragment of benign squamous epithelium.  Negative for neoplasm.   ADDENDUM:  P16 immunostain is strongly positive suggestive of this lesion being the HPV related.    02/08/2022 Cancer Staging   Staging form: Anus, AJCC V9 - Clinical stage from 02/08/2022: Stage IIA (cT2, cN0, cM0) - Signed by Truitt Merle, MD on 02/23/2022 Stage prefix: Initial diagnosis   02/23/2022 Initial Diagnosis   Anal cancer ([place])   03/09/2022 - 03/13/2022 Chemotherapy   Patient is on Treatment Plan : ANUS Mitomycin D1,28 / 5FU D1-4, 28-31 q32d     03/09/2022 -  Chemotherapy   Patient is on Treatment Plan : ANUS Mitomycin D1,28 + 5FU D1-4, 28-31 q32d        INTERVAL HISTORY:  Tammy Garrison is here for a follow up of anal cancer.  I saw her last time in the hospital 3 to 4 weeks ago when she was admitted for right axillary abscess.  She has completed oral antibiotics, and recovering well.  She still has a small opening wound in the right axilla, with mild tenderness.  She also complains of right arm tenderness from IV access when she was in the hospital.  She is eating well, energy also recovered well.  Her vaginal bleeding stopped when she was in the hospital 3 to 4 weeks ago, and restarted last week.  She will follow-up with her gynecologist.   All other systems were reviewed with the patient and are negative.  MEDICAL HISTORY:  Past Medical History:  Diagnosis Date   Bipolar 1 disorder ([place])    ETOH abuse    H/O self-harm    H/O suicide  attempt    x 5 - last 05/2016 - overdose Ibuprofen   Marijuana abuse    Rheumatoid arthritis ([place])     SURGICAL HISTORY: Past Surgical History:  Procedure Laterality Date   HEMORRHOID SURGERY N/A 02/08/2022   Procedure: EXTERNAL AND INTERNAL HEMORRHOIDECTOMY;  Surgeon: Georganna Skeans, MD;  Location: [facility];  Service: General;  Laterality: N/A;   INCISION AND DRAINAGE ABSCESS Right 04/22/2022   Procedure: INCISION AND DRAINAGE axilla;  Surgeon: Kinsinger, Arta Bruce, MD;  Location[facility];  Service: General;  Laterality: Right;    I have reviewed the social history and family history with the patient and they are unchanged from previous note.  ALLERGIES:  is allergic to coconut flavor.  MEDICATIONS:  Current Outpatient Medications  Medication Sig Dispense Refill   Calcium Carbonate Antacid (TUMS PO) Take 2-3 tablets by mouth daily as needed (GI upset).     lidocaine (REGENECARE HA) 2 % jelly Apply 1 Application topically daily. 85 g 0   Loperamide HCl (IMODIUM PO) Take 2 tablets by mouth daily as needed (diarrhea).     LORazepam (ATIVAN) 0.5 MG tablet Take 1 tablet (0.5 mg total) by mouth every 6 (six) hours as needed (nausea). (Patient taking differently: Take 0.5 mg by mouth daily as needed for anxiety (nausea).) 20 tablet 0   megestrol (MEGACE) 40 MG tablet Take 1 tablet (40 mg total) by mouth 2 (two) times daily. (Patient taking differently: Take 40 mg by mouth in the morning, at noon, and at bedtime.) 28 tablet 0   Oxycodone HCl 10 MG TABS Take 1 tablet (10 mg total) by mouth every 6 (six) hours as needed for severe pain. 12 tablet 0   prochlorperazine (COMPAZINE) 10 MG tablet Take 10 mg by mouth 2 (two) times daily as needed for nausea or vomiting.     promethazine (PHENERGAN) 25 MG tablet Take 1 tablet (25 mg total) by mouth every 6 (six) hours as needed for nausea or vomiting. (Patient taking differently: Take 25 mg by mouth daily as needed for nausea or vomiting.) 30 tablet 1   No  current facility-administered medications for this visit.   Facility-Administered Medications Ordered in Other Visits  Medication Dose Route Frequency Provider Last Rate Last Admin   0.9 %  sodium chloride infusion (Manually program via Guardrails IV Fluids)  250 mL Intravenous Once Truitt Merle, MD       heparin lock flush 100 unit/mL  250 Units Intracatheter Once Truitt Merle, MD       sodium chloride flush (NS) 0.9 % injection 10 mL  10 mL Intracatheter Once Truitt Merle, MD       sodium chloride flush (NS) 0.9 % injection 10 mL  10 mL Intracatheter Once Truitt Merle, MD       sodium chloride flush (NS) 0.9 % injection 10 mL  10 mL Intracatheter Once Truitt Merle, MD        PHYSICAL EXAMINATION: ECOG PERFORMANCE STATUS: 1 - Symptomatic but completely ambulatory  Vitals:   05/12/22 1227  BP: 128/81  Pulse: (!) 119  Resp:  18  Temp: 98.6 F (37 C)  SpO2: 100%   Wt Readings from Last 3 Encounters:  05/12/22 266 lb 6.4 oz (120.8 kg)  04/22/22 262 lb (118.8 kg)  04/20/22 262 lb 11.2 oz (119.2 kg)     GENERAL:alert, no distress and comfortable SKIN: skin color normal, no rashes, (+) 1 cm shallow open wound in the right axilla, with pink granular tissue, no discharge or surrounding skin erythema.  Mild tenderness.  Skin discoloration in the right forearm, from previous IV access and ecchymosis. EYES: normal, Conjunctiva are pink and non-injected, sclera clear  NEURO: alert & oriented x 3 with fluent speech  LABORATORY DATA:  I have reviewed the data as listed    Latest Ref Rng & Units 04/24/2022    6:11 AM 04/23/2022    5:52 AM 04/22/2022    8:08 AM  CBC  WBC 4.0 - 10.5 K/uL 4.9  4.1  2.0   Hemoglobin 12.0 - 15.0 g/dL 8.2  8.1  7.8   Hematocrit 36.0 - 46.0 % 26.4  25.1  24.5   Platelets 150 - 400 K/uL 61  57  50         Latest Ref Rng & Units 04/23/2022    5:52 AM 04/21/2022    5:40 AM 04/20/2022    7:39 PM  CMP  Glucose 70 - 99 mg/dL 127  99  113   BUN 6 - 20 mg/dL '9  8  10    '$ Creatinine 0.44 - 1.00 mg/dL 0.68  0.70  0.61   Sodium 135 - 145 mmol/L 140  138  135   Potassium 3.5 - 5.1 mmol/L 3.7  3.8  3.7   Chloride 98 - 111 mmol/L 112  107  104   CO2 22 - 32 mmol/L '23  21  23   '$ Calcium 8.9 - 10.3 mg/dL 9.0  9.2  9.1   Total Protein 6.5 - 8.1 g/dL 7.0   7.7   Total Bilirubin 0.3 - 1.2 mg/dL 0.5   0.4   Alkaline Phos 38 - 126 U/L 65   73   AST 15 - 41 U/L 14   32   ALT 0 - 44 U/L 32   37       RADIOGRAPHIC STUDIES: I have personally reviewed the radiological images as listed and agreed with the findings in the report. No results found.    No orders of the defined types were placed in this encounter.  All questions were answered. The patient knows to call the clinic with any problems, questions or concerns. No barriers to learning was detected. The total time spent in the appointment was 30 minutes.     Truitt Merle, MD 05/12/2022

## 2022-05-12 NOTE — Progress Notes (Signed)
Patient no longer has a PICC line. Appt scheduled for lab and patient advised to have labs drawn peripherally.

## 2022-05-13 ENCOUNTER — Telehealth: Payer: Self-pay | Admitting: Hematology

## 2022-05-13 NOTE — Telephone Encounter (Signed)
Left patient a voicemail regarding 11/8 appointments

## 2022-05-18 ENCOUNTER — Emergency Department (HOSPITAL_COMMUNITY): Payer: No Typology Code available for payment source

## 2022-05-18 ENCOUNTER — Emergency Department (HOSPITAL_COMMUNITY)
Admission: EM | Admit: 2022-05-18 | Discharge: 2022-05-19 | Disposition: A | Discharge status: Home / Self Care | Payer: No Typology Code available for payment source | Attending: Emergency Medicine | Admitting: Emergency Medicine

## 2022-05-18 ENCOUNTER — Other Ambulatory Visit: Payer: Self-pay

## 2022-05-18 ENCOUNTER — Encounter: Payer: Self-pay | Admitting: Hematology

## 2022-05-18 ENCOUNTER — Encounter (HOSPITAL_COMMUNITY): Payer: Self-pay

## 2022-05-18 DIAGNOSIS — N760 Acute vaginitis: Secondary | ICD-10-CM | POA: Insufficient documentation

## 2022-05-18 DIAGNOSIS — B9689 Other specified bacterial agents as the cause of diseases classified elsewhere: Secondary | ICD-10-CM | POA: Insufficient documentation

## 2022-05-18 DIAGNOSIS — R1032 Left lower quadrant pain: Secondary | ICD-10-CM

## 2022-05-18 LAB — CBC WITH DIFFERENTIAL/PLATELET
Abs Immature Granulocytes: 0.11 10*3/uL — ABNORMAL HIGH (ref 0.00–0.07)
Basophils Absolute: 0 10*3/uL (ref 0.0–0.1)
Basophils Relative: 0 %
Eosinophils Absolute: 0.2 10*3/uL (ref 0.0–0.5)
Eosinophils Relative: 3 %
HCT: 35.5 % — ABNORMAL LOW (ref 36.0–46.0)
Hemoglobin: 11 g/dL — ABNORMAL LOW (ref 12.0–15.0)
Immature Granulocytes: 2 %
Lymphocytes Relative: 7 %
Lymphs Abs: 0.5 10*3/uL — ABNORMAL LOW (ref 0.7–4.0)
MCH: 29.2 pg (ref 26.0–34.0)
MCHC: 31 g/dL (ref 30.0–36.0)
MCV: 94.2 fL (ref 80.0–100.0)
Monocytes Absolute: 0.7 10*3/uL (ref 0.1–1.0)
Monocytes Relative: 11 %
Neutro Abs: 5.1 10*3/uL (ref 1.7–7.7)
Neutrophils Relative %: 77 %
Platelets: 438 10*3/uL — ABNORMAL HIGH (ref 150–400)
RBC: 3.77 MIL/uL — ABNORMAL LOW (ref 3.87–5.11)
RDW: 27.3 % — ABNORMAL HIGH (ref 11.5–15.5)
WBC: 6.7 10*3/uL (ref 4.0–10.5)
nRBC: 0 % (ref 0.0–0.2)

## 2022-05-18 LAB — COMPREHENSIVE METABOLIC PANEL
ALT: 15 U/L (ref 0–44)
AST: 16 U/L (ref 15–41)
Albumin: 3.7 g/dL (ref 3.5–5.0)
Alkaline Phosphatase: 61 U/L (ref 38–126)
Anion gap: 6 (ref 5–15)
BUN: 8 mg/dL (ref 6–20)
CO2: 24 mmol/L (ref 22–32)
Calcium: 9.2 mg/dL (ref 8.9–10.3)
Chloride: 106 mmol/L (ref 98–111)
Creatinine, Ser: 0.77 mg/dL (ref 0.44–1.00)
GFR, Estimated: >60 mL/min (ref >60)
Glucose, Bld: 103 mg/dL — ABNORMAL HIGH (ref 70–99)
Potassium: 3.7 mmol/L (ref 3.5–5.1)
Sodium: 136 mmol/L (ref 135–145)
Total Bilirubin: 0.3 mg/dL (ref 0.3–1.2)
Total Protein: 7.7 g/dL (ref 6.5–8.1)

## 2022-05-18 LAB — I-STAT BETA HCG BLOOD, ED (MC, WL, AP ONLY): I-stat hCG, quantitative: <5 m[IU]/mL (ref <5)

## 2022-05-18 LAB — LIPASE, BLOOD: Lipase: 32 U/L (ref 11–51)

## 2022-05-18 MED ORDER — IOHEXOL 300 MG/ML  SOLN
100.0000 mL | Freq: Once | INTRAMUSCULAR | Status: AC | PRN
  Administered 2022-05-18: 100 mL via INTRAVENOUS

## 2022-05-18 NOTE — ED Provider Triage Note (Signed)
Emergency Medicine Provider Triage Evaluation Note  Tammy Garrison , a 31 y.o. female  was evaluated in triage.  Pt complains of abdominal pain, 1-2 weeks, progressively worsening, with nausea, no vomiting, no fevers.  Review of Systems  Positive: As above Negative: As above  Physical Exam  BP (!) 142/98   Pulse 99   Temp 97.9 F (36.6 C) (Oral)   Resp 18   SpO2 99%  Gen:   Awake, no distress   Resp:  Normal effort  MSK:   Moves extremities without difficulty  Other:  Abdomen is very tender to light touch  Medical Decision Making  Medically screening exam initiated at 7:03 PM.  Appropriate orders placed.  Tammy Garrison was informed that the remainder of the evaluation will be completed by another provider, this initial triage assessment does not replace that evaluation, and the importance of remaining in the ED until their evaluation is complete.     Tacy Learn, PA-C 05/18/22 1903

## 2022-05-18 NOTE — ED Notes (Signed)
ED Provider at bedside. 

## 2022-05-18 NOTE — ED Notes (Signed)
Needs IV for CT  

## 2022-05-18 NOTE — ED Triage Notes (Signed)
Pt c/o abdominal pain, tenderness, swelling to left side of abdomen for the past week and a half. Pt states pain is worse with movement, using the bathroom. Pt finished chemo/radiation sept 19th.

## 2022-05-19 ENCOUNTER — Encounter: Payer: Self-pay | Admitting: Hematology

## 2022-05-19 ENCOUNTER — Other Ambulatory Visit (HOSPITAL_COMMUNITY): Payer: Self-pay

## 2022-05-19 LAB — URINALYSIS, ROUTINE W REFLEX MICROSCOPIC
Bilirubin Urine: NEGATIVE
Glucose, UA: NEGATIVE mg/dL
Hgb urine dipstick: NEGATIVE
Ketones, ur: NEGATIVE mg/dL
Leukocytes,Ua: NEGATIVE
Nitrite: NEGATIVE
Protein, ur: NEGATIVE mg/dL
Specific Gravity, Urine: 1.005 (ref 1.005–1.030)
pH: 7 (ref 5.0–8.0)

## 2022-05-19 LAB — CBC WITH DIFFERENTIAL/PLATELET
Abs Immature Granulocytes: 0.14 10*3/uL — ABNORMAL HIGH (ref 0.00–0.07)
Basophils Absolute: 0 10*3/uL (ref 0.0–0.1)
Basophils Relative: 1 %
Eosinophils Absolute: 0.3 10*3/uL (ref 0.0–0.5)
Eosinophils Relative: 5 %
HCT: 32.7 % — ABNORMAL LOW (ref 36.0–46.0)
Hemoglobin: 10.5 g/dL — ABNORMAL LOW (ref 12.0–15.0)
Immature Granulocytes: 2 %
Lymphocytes Relative: 8 %
Lymphs Abs: 0.5 10*3/uL — ABNORMAL LOW (ref 0.7–4.0)
MCH: 30.1 pg (ref 26.0–34.0)
MCHC: 32.1 g/dL (ref 30.0–36.0)
MCV: 93.7 fL (ref 80.0–100.0)
Monocytes Absolute: 0.7 10*3/uL (ref 0.1–1.0)
Monocytes Relative: 11 %
Neutro Abs: 4.5 10*3/uL (ref 1.7–7.7)
Neutrophils Relative %: 73 %
Platelets: 397 10*3/uL (ref 150–400)
RBC: 3.49 MIL/uL — ABNORMAL LOW (ref 3.87–5.11)
RDW: 27.4 % — ABNORMAL HIGH (ref 11.5–15.5)
WBC: 6.1 10*3/uL (ref 4.0–10.5)
nRBC: 0 % (ref 0.0–0.2)

## 2022-05-19 LAB — GC/CHLAMYDIA PROBE AMP (~~LOC~~) NOT AT ARMC
Chlamydia: NEGATIVE
Comment: NEGATIVE
Comment: NORMAL
Neisseria Gonorrhea: NEGATIVE

## 2022-05-19 LAB — WET PREP, GENITAL
Sperm: NONE SEEN
Trich, Wet Prep: NONE SEEN
WBC, Wet Prep HPF POC: <10 (ref <10)
Yeast Wet Prep HPF POC: NONE SEEN

## 2022-05-19 LAB — LACTIC ACID, PLASMA: Lactic Acid, Venous: 1.5 mmol/L (ref 0.5–1.9)

## 2022-05-19 MED ORDER — METRONIDAZOLE 500 MG PO TABS
500.0000 mg | ORAL_TABLET | Freq: Two times a day (BID) | ORAL | 0 refills | Status: DC
  Filled 2022-05-19: qty 14, 7d supply, fill #0

## 2022-05-19 MED ORDER — ONDANSETRON HCL 4 MG/2ML IJ SOLN
4.0000 mg | Freq: Once | INTRAMUSCULAR | Status: AC
  Administered 2022-05-19: 4 mg via INTRAVENOUS
  Filled 2022-05-19: qty 2

## 2022-05-19 MED ORDER — HYDROMORPHONE HCL 1 MG/ML IJ SOLN
1.0000 mg | Freq: Once | INTRAMUSCULAR | Status: AC
  Administered 2022-05-19: 1 mg via INTRAVENOUS
  Filled 2022-05-19: qty 1

## 2022-05-19 NOTE — ED Provider Notes (Signed)
[facility] DEPT Provider Note   CSN: [id number] Arrival date & time: 05/18/22  4696     History  Chief Complaint  Patient presents with   Abdominal Pain    Tammy Garrison is a 31 y.o. female.  The history is provided by the patient and medical records.  Abdominal Pain Tammy Garrison is a 31 y.o. female who presents to the Emergency Department complaining of abdominal pain.  She presents to the ED for evaluation of abdominal pain for the last 1.5 weeks.  Has LLQ abdominal pain, worse with BM or urination.  Has pain on palpation.  Pain is sharp.  She stopped taking her pain medication and nausea medication.  She recently completed abx for axillary abscess.    Recently treated for [facility] rectum s/p chemo and radiation.  Completed treatment 9/19.    No fever.  Has nausea, no vomiting.  No dysuria.  Has pain with BM - since radiation.       Home Medications Prior to Admission medications   Medication Sig Start Date End Date Taking? Authorizing Provider  acetaminophen (TYLENOL) 500 MG tablet Take 500 mg by mouth every 6 (six) hours as needed for moderate pain.   Yes [provider]  Calcium Carbonate Antacid (TUMS PO) Take 2-3 tablets by mouth daily as needed (GI upset).   Yes [provider]  doxylamine, Sleep, (SLEEP AID) 25 MG tablet Take 25 mg by mouth at bedtime.   Yes [provider]  ibuprofen (ADVIL) 200 MG tablet Take 200 mg by mouth every 6 (six) hours as needed for moderate pain.   Yes [provider]  lidocaine (REGENECARE HA) 2 % jelly Apply 1 Application topically daily. Patient taking differently: Apply 1 Application topically daily as needed (pain). 04/25/22  Yes Danford, Suann Larry, MD  Loperamide HCl (IMODIUM PO) Take 2 tablets by mouth daily as needed (diarrhea).   Yes [provider]  LORazepam (ATIVAN) 0.5 MG tablet Take 1 tablet (0.5 mg total) by mouth every 6 (six)  hours as needed (nausea). Patient taking differently: Take 0.5 mg by mouth daily as needed for anxiety (nausea). 04/10/22  Yes Truitt Merle, MD  metroNIDAZOLE (FLAGYL) 500 MG tablet Take 1 tablet (500 mg total) by mouth 2 (two) times daily. 05/19/22  Yes Quintella Reichert, MD  Oxycodone HCl 10 MG TABS Take 1 tablet (10 mg total) by mouth every 6 (six) hours as needed for severe pain. 04/25/22  Yes Danford, Suann Larry, MD  promethazine (PHENERGAN) 25 MG tablet Take 1 tablet (25 mg total) by mouth every 6 (six) hours as needed for nausea or vomiting. Patient taking differently: Take 25 mg by mouth daily as needed for nausea or vomiting. 03/13/22  Yes Truitt Merle, MD  megestrol (MEGACE) 40 MG tablet Take 1 tablet (40 mg total) by mouth 2 (two) times daily. Patient not taking: Reported on 05/19/2022 04/01/22   Marcello Fennel, PA-C      Allergies    Coconut flavor    Review of Systems   Review of Systems  Gastrointestinal:  Positive for abdominal pain.  All other systems reviewed and are negative.   Physical Exam Updated Vital Signs BP (!) 153/100   Pulse (!) 116   Temp 98.4 F (36.9 C)   Resp 18   SpO2 100%  Physical Exam Vitals and nursing note reviewed.  Constitutional:      Appearance: She is well-developed.  HENT:     Head:  Normocephalic and atraumatic.  Cardiovascular:     Rate and Rhythm: Normal rate and regular rhythm.  Pulmonary:     Effort: Pulmonary effort is normal.     Breath sounds: Normal breath sounds.  Abdominal:     Palpations: Abdomen is soft.     Tenderness: There is abdominal tenderness. There is no guarding or rebound.     Comments: Moderate LLQ abdominal tenderness  Genitourinary:    Comments: Scant vaginal discharge.  No CMT. Musculoskeletal:     Comments: Healing axillary wound without significant erythema or drainage.   Skin:    General: Skin is warm and dry.  Neurological:     Mental Status: She is alert and oriented to person, place, and time.   Psychiatric:        Behavior: Behavior normal.     ED Results / Procedures / Treatments   Labs (all labs ordered are listed, but only abnormal results are displayed) Labs Reviewed  WET PREP, GENITAL - Abnormal; Notable for the following components:      Result Value   Clue Cells Wet Prep HPF POC PRESENT (*)    All other components within normal limits  CBC WITH DIFFERENTIAL/PLATELET - Abnormal; Notable for the following components:   RBC 3.77 (*)    Hemoglobin 11.0 (*)    HCT 35.5 (*)    RDW 27.3 (*)    Platelets 438 (*)    Lymphs Abs 0.5 (*)    Abs Immature Granulocytes 0.11 (*)    All other components within normal limits  COMPREHENSIVE METABOLIC PANEL - Abnormal; Notable for the following components:   Glucose, Bld 103 (*)    All other components within normal limits  URINALYSIS, ROUTINE W REFLEX MICROSCOPIC - Abnormal; Notable for the following components:   Color, Urine STRAW (*)    All other components within normal limits  CBC WITH DIFFERENTIAL/PLATELET - Abnormal; Notable for the following components:   RBC 3.49 (*)    Hemoglobin 10.5 (*)    HCT 32.7 (*)    RDW 27.4 (*)    Lymphs Abs 0.5 (*)    Abs Immature Granulocytes 0.14 (*)    All other components within normal limits  CULTURE, BLOOD (ROUTINE X 2)  CULTURE, BLOOD (ROUTINE X 2)  LIPASE, BLOOD  LACTIC ACID, PLASMA  LACTIC ACID, PLASMA  I-STAT BETA HCG BLOOD, ED ([facility], [facility], [facility] ONLY)  GC/CHLAMYDIA PROBE AMP ([place]) NOT AT [facility]    EKG None  Radiology CT Abdomen Pelvis W Contrast  Result Date: 05/18/2022 CLINICAL DATA:  Acute abdominal pain. 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 EXAM: CT ABDOMEN AND PELVIS WITH CONTRAST TECHNIQUE: Multidetector CT imaging of the abdomen and pelvis was performed using the standard protocol following bolus administration of intravenous contrast. RADIATION DOSE REDUCTION: This exam was performed according to the departmental  dose-optimization program which includes automated exposure control, adjustment of the mA and/or kV according to patient size and/or use of iterative reconstruction technique. CONTRAST:  118m OMNIPAQUE IOHEXOL 300 MG/ML  SOLN COMPARISON:  PET-CT 02/26/2022. FINDINGS: Lower chest: No acute abnormality. Hepatobiliary: No focal liver abnormality is seen. No gallstones, gallbladder wall thickening, or biliary dilatation. Pancreas: Unremarkable. No pancreatic ductal dilatation or surrounding inflammatory changes. Spleen: Normal in size without focal abnormality. Adrenals/Urinary Tract: Adrenal glands are unremarkable. Kidneys are normal, without renal calculi, focal lesion, or hydronephrosis. Bladder is unremarkable. Stomach/Bowel: Stomach is within normal limits. Appendix appears normal. No evidence of bowel wall thickening,  distention, or inflammatory changes. Vascular/Lymphatic: No significant vascular findings are present. No enlarged abdominal or pelvic lymph nodes. Reproductive: Markedly enlarged lobulated uterus is again seen containing multiple rounded masses, likely fibroids. The largest fibroid is in the left fundal region measuring 9.8 cm which is unchanged from prior. Ovaries are not well delineated. Other: No abdominal wall hernia or abnormality. No abdominopelvic ascites. Musculoskeletal: No acute osseous findings. Small sclerotic foci in the iliac bones appear unchanged. IMPRESSION: 1. No acute localizing process in the abdomen or pelvis. 2. Markedly enlarged lobulated uterus containing multiple fibroids, unchanged. Electronically Signed   By: Ronney Asters M.D.   On: 05/18/2022 22:57    Procedures Procedures    Medications Ordered in ED Medications  iohexol (OMNIPAQUE) 300 MG/ML solution 100 mL (100 mLs Intravenous Contrast Given 05/18/22 2236)  HYDROmorphone (DILAUDID) injection 1 mg (1 mg Intravenous Given 05/19/22 0215)  ondansetron (ZOFRAN) injection 4 mg (4 mg Intravenous Given 05/19/22  0231)    ED Course/ Medical Decision Making/ A&P                           Medical Decision Making Amount and/or Complexity of Data Reviewed Labs: ordered.  Risk Prescription drug management.   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  She does have tenderness on examination without peritoneal findings.  CT scan without acute abnormality, demonstrates lobulated uterus with fibroids.  CBC with stable anemia, toxic granulocytes on initial WBC without left shift.  Repeat CBC without toxic granulocytes.  Lactate is within normal limits.  No evidence of clear infectious process at this time.  Pelvic examination is benign.  Wet prep is significant for clue cells, will treat for BV.  Current picture is not consistent with PID, tubo-ovarian abscess.  Discussed with patient continuing her current medications as well as outpatient follow-up and return precautions.        Final Clinical Impression(s) / ED Diagnoses Final diagnoses:  Left lower quadrant abdominal pain  BV (bacterial vaginosis)    Rx / DC Orders ED Discharge Orders          Ordered    metroNIDAZOLE (FLAGYL) 500 MG tablet  2 times daily        05/19/22 0401              Quintella Reichert, MD 05/19/22 [id number]

## 2022-05-21 ENCOUNTER — Encounter: Payer: No Typology Code available for payment source | Admitting: Physical Therapy

## 2022-05-24 LAB — CULTURE, BLOOD (ROUTINE X 2)
Culture: NO GROWTH
Special Requests: ADEQUATE

## 2022-05-25 ENCOUNTER — Encounter: Payer: Self-pay | Admitting: Hematology

## 2022-05-25 ENCOUNTER — Ambulatory Visit
Admission: RE | Admit: 2022-05-25 | Discharge: 2022-05-25 | Disposition: A | Discharge status: Home / Self Care | Payer: Self-pay | Source: Ambulatory Visit | Attending: Hematology | Admitting: Hematology

## 2022-05-25 DIAGNOSIS — C210 Malignant neoplasm of anus, unspecified: Secondary | ICD-10-CM | POA: Insufficient documentation

## 2022-05-25 NOTE — Progress Notes (Signed)
  Radiation Oncology         [phone] ________________________________  Name: Tammy Garrison MRN: [id number]  Date of Service: 05/25/2022  DOB: [birth year]  Post Treatment Telephone Note  Diagnosis:   Squamous cell carcinoma of the anus  Intent: Curative  Radiation Treatment Dates: 03/09/2022 through 04/20/2022 Site Technique Total Dose (Gy) Dose per Fx (Gy) Completed Fx Beam Energies  Anus: Anus IMRT 54/54 1.8 30/30 6X  (as documented in provider EOT note)   The patient was not available for call today. A voicemail was left.  The patient is scheduled for surveillance with colorectal surgeon Dr. Dema Severin. The patient has scheduled follow up with her medical oncologist Dr. Burr Medico for ongoing surveillance as well, and follow up with physical therapy. The patient was encouraged to call if she develops concerns or questions regarding radiation.   This concludes the Post-Treat-Nursing interview.   Leandra Kern, LPN

## 2022-06-05 ENCOUNTER — Other Ambulatory Visit (HOSPITAL_COMMUNITY): Payer: Self-pay

## 2022-06-10 ENCOUNTER — Encounter: Payer: Self-pay | Admitting: Hematology

## 2022-06-10 ENCOUNTER — Inpatient Hospital Stay: Payer: Self-pay

## 2022-06-10 ENCOUNTER — Inpatient Hospital Stay: Payer: No Typology Code available for payment source | Attending: Hematology | Admitting: Hematology

## 2022-06-10 ENCOUNTER — Other Ambulatory Visit: Payer: Self-pay

## 2022-06-10 VITALS — BP 124/87 | HR 101 | Temp 98.4°F | Resp 18 | Ht 72.0 in | Wt 261.3 lb

## 2022-06-10 DIAGNOSIS — C210 Malignant neoplasm of anus, unspecified: Secondary | ICD-10-CM | POA: Insufficient documentation

## 2022-06-10 DIAGNOSIS — N920 Excessive and frequent menstruation with regular cycle: Secondary | ICD-10-CM | POA: Insufficient documentation

## 2022-06-10 DIAGNOSIS — M069 Rheumatoid arthritis, unspecified: Secondary | ICD-10-CM | POA: Insufficient documentation

## 2022-06-10 DIAGNOSIS — D500 Iron deficiency anemia secondary to blood loss (chronic): Secondary | ICD-10-CM | POA: Insufficient documentation

## 2022-06-10 DIAGNOSIS — R102 Pelvic and perineal pain: Secondary | ICD-10-CM | POA: Insufficient documentation

## 2022-06-10 DIAGNOSIS — Z79899 Other long term (current) drug therapy: Secondary | ICD-10-CM | POA: Insufficient documentation

## 2022-06-10 LAB — COMPREHENSIVE METABOLIC PANEL
ALT: 16 U/L (ref 0–44)
AST: 14 U/L — ABNORMAL LOW (ref 15–41)
Albumin: 4.1 g/dL (ref 3.5–5.0)
Alkaline Phosphatase: 62 U/L (ref 38–126)
Anion gap: 8 (ref 5–15)
BUN: 15 mg/dL (ref 6–20)
CO2: 26 mmol/L (ref 22–32)
Calcium: 9.7 mg/dL (ref 8.9–10.3)
Chloride: 105 mmol/L (ref 98–111)
Creatinine, Ser: 0.88 mg/dL (ref 0.44–1.00)
GFR, Estimated: >60 mL/min (ref >60)
Glucose, Bld: 101 mg/dL — ABNORMAL HIGH (ref 70–99)
Potassium: 3.9 mmol/L (ref 3.5–5.1)
Sodium: 139 mmol/L (ref 135–145)
Total Bilirubin: 0.4 mg/dL (ref 0.3–1.2)
Total Protein: 7.4 g/dL (ref 6.5–8.1)

## 2022-06-10 LAB — CBC WITH DIFFERENTIAL/PLATELET
Abs Immature Granulocytes: 0.01 10*3/uL (ref 0.00–0.07)
Basophils Absolute: 0 10*3/uL (ref 0.0–0.1)
Basophils Relative: 1 %
Eosinophils Absolute: 0.5 10*3/uL (ref 0.0–0.5)
Eosinophils Relative: 9 %
HCT: 36.7 % (ref 36.0–46.0)
Hemoglobin: 12 g/dL (ref 12.0–15.0)
Immature Granulocytes: 0 %
Lymphocytes Relative: 10 %
Lymphs Abs: 0.5 10*3/uL — ABNORMAL LOW (ref 0.7–4.0)
MCH: 30.5 pg (ref 26.0–34.0)
MCHC: 32.7 g/dL (ref 30.0–36.0)
MCV: 93.4 fL (ref 80.0–100.0)
Monocytes Absolute: 0.7 10*3/uL (ref 0.1–1.0)
Monocytes Relative: 12 %
Neutro Abs: 3.9 10*3/uL (ref 1.7–7.7)
Neutrophils Relative %: 68 %
Platelets: 288 10*3/uL (ref 150–400)
RBC: 3.93 MIL/uL (ref 3.87–5.11)
RDW: 18.6 % — ABNORMAL HIGH (ref 11.5–15.5)
WBC: 5.6 10*3/uL (ref 4.0–10.5)
nRBC: 0 % (ref 0.0–0.2)

## 2022-06-10 LAB — FERRITIN: Ferritin: 55 ng/mL (ref 11–307)

## 2022-06-10 NOTE — Progress Notes (Signed)
[facility]   Telephone:([phone]) [phone] [phone]   Clinic Follow up Note   Patient Care Team: Pcp, No as PCP - General [name], MD (Rheumatology) Royston Sinner Colin Benton, MD as Consulting Physician (Obstetrics and Gynecology) Truitt Merle, MD as Consulting Physician (Hematology and Oncology)  Date of Service:  06/10/2022  CHIEF COMPLAINT: f/u of anal cancer  CURRENT THERAPY:  Surveillance  ASSESSMENT & PLAN:  Tammy Garrison is a 31 y.o. female with   1. Anal Cancer, cT2N0Mx, with hypermetabolic left axillary nodes, HPV(+) -presented with worsening rectal bleeding, began passing clots and was admitted 02/06/22 for anemia from blood loss. S/p hemorrhoidectomy on 02/08/22 by Dr. 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 -PET scan 02/26/22 showed: intense uptake to anus; no signs of solid organ or FDG-avid nodal metastasis within abdomen or pelvis, hypermetabolic left axillary lymph nodes.  Sequently left axillary lymph node biopsy was benign. -she established care with Dr. Dema Severin on 03/03/22. -s/p concurrent chemoRT with mitomycin/5FU 03/09/22 - 04/20/22, tolerated moderately well with significant rectal pain. -she is still experiencing significant pain (see #2), concerning for residual disease. Given her symptoms, I recommend performing her post-treatment PET scan sooner.I ordered today to be done in next 2-3 weeks.   2. Symptom Management: Rectal pain/discomfort -she reports persistent pain with BM and sitting, adding it initially improved but then worsened. -she was unable to tolerate anoscope at f/u with Dr. Dema Severin on 06/01/22. -I encouraged her to try OTC pain medicine to avoid worsening constipation.   3. Anemia, from vaginal bleeding and iron deficiency -low hgb dating back to 04/2018, prior to that in 2017 was WNL. She has heavy periods, requiring  pad/tampon change every 30 minutes. -she has required IV Feraheme and pRBC. -hgb improved to 12 today (06/10/22) -Follow-up with gynecologist.       PLAN:  -lab and PET scan to be done ~11/27 -f/u on 11/29 or 11/30   No problem-specific Assessment & Plan notes found for this encounter.   SUMMARY OF ONCOLOGIC HISTORY: Oncology History  Anal squamous cell carcinoma ([place])  02/08/2022 Pathology Results   FINAL MICROSCOPIC DIAGNOSIS:   A. SOFT TISSUE, ANTERIOR MASS VS. HEMORRHOID, EXCISION:  Invasive squamous cell carcinoma, focally keratinizing and  well-differentiated.  Carcinoma is present at the margins of excision.   B. HEMORRHOID, INTERNAL, EXCISION:  Hemorrhoid without the overlying epithelium.  Separate fragment of benign squamous epithelium.  Negative for neoplasm.   ADDENDUM:  P16 immunostain is strongly positive suggestive of this lesion being the HPV related.    02/08/2022 Cancer Staging   Staging form: Anus, AJCC V9 - Clinical stage from 02/08/2022: Stage IIA (cT2, cN0, cM0) - Signed by Truitt Merle, MD on 02/23/2022 Stage prefix: Initial diagnosis   02/23/2022 Initial Diagnosis   Anal cancer ([place])   03/09/2022 - 03/13/2022 Chemotherapy   Patient is on Treatment Plan : ANUS Mitomycin D1,28 / 5FU D1-4, 28-31 q32d     03/09/2022 -  Chemotherapy   Patient is on Treatment Plan : ANUS Mitomycin D1,28 + 5FU D1-4, 28-31 q32d        INTERVAL HISTORY:  Tammy Garrison is here for a follow up of anal cancer. She was last seen by me on 05/12/22. She presents to the clinic alone. She reports she is still having significant rectal pain and discomfort, especially with BM and sitting.   All other  systems were reviewed with the patient and are negative.  MEDICAL HISTORY:  Past Medical History:  Diagnosis Date   Bipolar 1 disorder ([place])    ETOH abuse    H/O self-harm    H/O suicide attempt    x 5 - last 05/2016 - overdose Ibuprofen   Marijuana abuse    Rheumatoid  arthritis ([place])     SURGICAL HISTORY: Past Surgical History:  Procedure Laterality Date   HEMORRHOID SURGERY N/A 02/08/2022   Procedure: EXTERNAL AND INTERNAL HEMORRHOIDECTOMY;  Surgeon: Georganna Skeans, MD;  Location: [facility];  Service: General;  Laterality: N/A;   INCISION AND DRAINAGE ABSCESS Right 04/22/2022   Procedure: INCISION AND DRAINAGE axilla;  Surgeon: Kinsinger, Arta Bruce, MD;  Location: [facility];  Service: General;  Laterality: Right;    I have reviewed the social history and family history with the patient and they are unchanged from previous note.  ALLERGIES:  is allergic to coconut flavor.  MEDICATIONS:  Current Outpatient Medications  Medication Sig Dispense Refill   acetaminophen (TYLENOL) 500 MG tablet Take 500 mg by mouth every 6 (six) hours as needed for moderate pain.     Calcium Carbonate Antacid (TUMS PO) Take 2-3 tablets by mouth daily as needed (GI upset).     doxylamine, Sleep, (SLEEP AID) 25 MG tablet Take 25 mg by mouth at bedtime.     ibuprofen (ADVIL) 200 MG tablet Take 200 mg by mouth every 6 (six) hours as needed for moderate pain.     lidocaine (REGENECARE HA) 2 % jelly Apply 1 Application topically daily. (Patient taking differently: Apply 1 Application topically daily as needed (pain).) 85 g 0   Loperamide HCl (IMODIUM PO) Take 2 tablets by mouth daily as needed (diarrhea).     LORazepam (ATIVAN) 0.5 MG tablet Take 1 tablet (0.5 mg total) by mouth every 6 (six) hours as needed (nausea). (Patient taking differently: Take 0.5 mg by mouth daily as needed for anxiety (nausea).) 20 tablet 0   megestrol (MEGACE) 40 MG tablet Take 1 tablet (40 mg total) by mouth 2 (two) times daily. (Patient not taking: Reported on 05/19/2022) 28 tablet 0   metroNIDAZOLE (FLAGYL) 500 MG tablet Take 1 tablet (500 mg total) by mouth 2 (two) times daily. 14 tablet 0   promethazine (PHENERGAN) 25 MG tablet Take 1 tablet (25 mg total) by mouth every 6 (six) hours as needed for nausea or  vomiting. (Patient taking differently: Take 25 mg by mouth daily as needed for nausea or vomiting.) 30 tablet 1   No current facility-administered medications for this visit.   Facility-Administered Medications Ordered in Other Visits  Medication Dose Route Frequency Provider Last Rate Last Admin   0.9 %  sodium chloride infusion (Manually program via Guardrails IV Fluids)  250 mL Intravenous Once Truitt Merle, MD       heparin lock flush 100 unit/mL  250 Units Intracatheter Once Truitt Merle, MD       sodium chloride flush (NS) 0.9 % injection 10 mL  10 mL Intracatheter Once Truitt Merle, MD       sodium chloride flush (NS) 0.9 % injection 10 mL  10 mL Intracatheter Once Truitt Merle, MD       sodium chloride flush (NS) 0.9 % injection 10 mL  10 mL Intracatheter Once Truitt Merle, MD        PHYSICAL EXAMINATION: ECOG PERFORMANCE STATUS: 1 - Symptomatic but completely ambulatory  Vitals:   06/10/22 0938  BP: 124/87  Pulse: (!) 101  Resp: 18  Temp: 98.4 F (36.9 C)  SpO2: 100%   Wt Readings from Last 3 Encounters:  06/10/22 261 lb 4.8 oz (118.5 kg)  05/12/22 266 lb 6.4 oz (120.8 kg)  04/22/22 262 lb (118.8 kg)     GENERAL:alert, no distress and comfortable SKIN: skin color, texture, turgor are normal, no rashes or significant lesions EYES: normal, Conjunctiva are pink and non-injected, sclera clear  NECK: supple, thyroid normal size, non-tender, without nodularity LYMPH:  no palpable lymphadenopathy in the cervical, axillary or inguinal ABDOMEN:abdomen soft, non-tender and normal bowel sounds Musculoskeletal:no cyanosis of digits and no clubbing  NEURO: alert & oriented x 3 with fluent speech, no focal motor/sensory deficits  LABORATORY DATA:  I have reviewed the data as listed    Latest Ref Rng & Units 06/10/2022    8:54 AM 05/19/2022    2:25 AM 05/18/2022    7:39 PM  CBC  WBC 4.0 - 10.5 K/uL 5.6  6.1  6.7   Hemoglobin 12.0 - 15.0 g/dL 12.0  10.5  11.0   Hematocrit 36.0 - 46.0 %  36.7  32.7  35.5   Platelets 150 - 400 K/uL 288  397  438         Latest Ref Rng & Units 06/10/2022    8:54 AM 05/18/2022    7:39 PM 05/12/2022    1:09 PM  CMP  Glucose 70 - 99 mg/dL 101  103  154   BUN 6 - 20 mg/dL '15  8  7   '$ Creatinine 0.44 - 1.00 mg/dL 0.88  0.77  0.72   Sodium 135 - 145 mmol/L 139  136  136   Potassium 3.5 - 5.1 mmol/L 3.9  3.7  3.6   Chloride 98 - 111 mmol/L 105  106  106   CO2 22 - 32 mmol/L '26  24  23   '$ Calcium 8.9 - 10.3 mg/dL 9.7  9.2  9.2   Total Protein 6.5 - 8.1 g/dL 7.4  7.7  7.6   Total Bilirubin 0.3 - 1.2 mg/dL 0.4  0.3  0.3   Alkaline Phos 38 - 126 U/L 62  61  64   AST 15 - 41 U/L '14  16  13   '$ ALT 0 - 44 U/L '16  15  11       '$ RADIOGRAPHIC STUDIES: I have personally reviewed the radiological images as listed and agreed with the findings in the report. No results found.    No orders of the defined types were placed in this encounter.  All questions were answered. The patient knows to call the clinic with any problems, questions or concerns. No barriers to learning was detected. The total time spent in the appointment was 30 minutes.     Truitt Merle, MD 06/10/2022   I, Wilburn Mylar, am acting as scribe for Truitt Merle, MD.   I have reviewed the above documentation for accuracy and completeness, and I agree with the above.

## 2022-06-11 ENCOUNTER — Ambulatory Visit: Payer: Self-pay | Admitting: Physical Therapy

## 2022-06-11 ENCOUNTER — Telehealth: Payer: Self-pay | Admitting: Hematology

## 2022-06-11 ENCOUNTER — Encounter: Payer: Self-pay | Admitting: Physical Therapy

## 2022-06-11 ENCOUNTER — Other Ambulatory Visit: Payer: Self-pay

## 2022-06-11 DIAGNOSIS — M25652 Stiffness of left hip, not elsewhere classified: Secondary | ICD-10-CM

## 2022-06-11 DIAGNOSIS — M25651 Stiffness of right hip, not elsewhere classified: Secondary | ICD-10-CM

## 2022-06-11 DIAGNOSIS — M6281 Muscle weakness (generalized): Secondary | ICD-10-CM

## 2022-06-11 DIAGNOSIS — R102 Pelvic and perineal pain: Secondary | ICD-10-CM

## 2022-06-11 NOTE — Telephone Encounter (Signed)
Spoke with patient regarding upcoming appointments  

## 2022-06-11 NOTE — Patient Instructions (Signed)
PROTOCOL FOR VAGINAL DILATORS   Wash dilator with soap and water prior to insertion.    Lay on your back reclined. Knees are to be up and apart while on your bed or in the bathtub with warm water.   Lubricate the end of the dilator with a water-soluble lubricant.  Separate the labia.   Tense the pelvic floor muscles than relax; while relaxing, slide lubricated dilator( round side of dilator) into the vagina.  Insert toward the direction of your spine. Dilator should feel snug and no pain more than 3/10.   Tense muscles again while holding the dilator so it does not get pushed out; relax and slide it in a little further.   Try blowing out as if filling a balloon; this may relax the muscles and allow penetration.  Repeat blowing out to insert dilator further.  Keep dilator in for 10 minutes if tolerate, with the pelvic floor muscles relaxed to further stretch the canal.   Never force the dilator into the canal. Once the dilator is comfortable start to move in and out, side to side, move your hips in different directions  3-4 times per week Progression of dilator.  When you are able to place dilator into vaginal canal and feel no pain or able to move without difficulty you are ready for the next size.  Before you go to the next size start with the original size for 2 minutes then use the next size up for 5 minutes. When the next size up is easy to use, do not have to start with the smaller size.     If the vaginal tissue is dry or very sensitive may want to ask you gynecologist for an estrogen cream to place on the tissue   Once you get to the largest dilator use 1 time per week for 2 months, then 1 time per month for 2 years.   Earlie Counts, PT [facility] [address], [place], [place] [address] W: [phone] [email or web address]

## 2022-06-11 NOTE — Therapy (Signed)
OUTPATIENT PHYSICAL THERAPY TREATMENT NOTE   Patient Name: Tammy Garrison MRN: [id number] DOB:[birth year], 31 y.o., female Today's Date: 06/11/2022  PCP: None  REFERRING PROVIDER:  Hayden Pedro, PA-C    END OF SESSION:   PT End of Session - 06/11/22 0943     Visit Number 4    Date for PT Re-Evaluation 07/17/22    Authorization Type self pay    PT Start Time 0940    PT Stop Time 1025    PT Time Calculation (min) 45 min    Activity Tolerance Patient tolerated treatment well    Behavior During Therapy [facility] for tasks assessed/performed             Past Medical History:  Diagnosis Date   Bipolar 1 disorder ([place])    ETOH abuse    H/O self-harm    H/O suicide attempt    x 5 - last 05/2016 - overdose Ibuprofen   Marijuana abuse    Rheumatoid arthritis ([place])    Past Surgical History:  Procedure Laterality Date   HEMORRHOID SURGERY N/A 02/08/2022   Procedure: EXTERNAL AND INTERNAL HEMORRHOIDECTOMY;  Surgeon: Georganna Skeans, MD;  Location: [place];  Service: General;  Laterality: N/A;   INCISION AND DRAINAGE ABSCESS Right 04/22/2022   Procedure: INCISION AND DRAINAGE axilla;  Surgeon: Kieth Brightly, Arta Bruce, MD;  Location: [facility];  Service: General;  Laterality: Right;   Patient Active Problem List   Diagnosis Date Noted   Fibroids 04/23/2022   Obesity (BMI 30-39.9) 04/22/2022   Abscess of right axilla 04/20/2022   Pancytopenia ([place]) 04/20/2022   PICC (peripherally inserted central catheter) in place 03/14/2022   Dehydration 03/13/2022   Nausea with vomiting 03/13/2022   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   Rationale for Evaluation and Treatment Rehabilitation   ONSET DATE: 02/08/2022   SUBJECTIVE:                                                                                                                                                                                            SUBJECTIVE STATEMENT:  I get my PET scan end of November to see if the cancer is gone. I am  on my cycle. I have a vaginal discharge that is yellow. It is not foul smelling. I had intercours and it was painful.    PAIN:  Are you having pain? Yes NPRS scale: 8/10 Pain location: Anterior, rectum   Pain type: burning and cramping Pain description: intermittent    Aggravating factors: difficulty with sitting, when she feels like she has to use the restroom.  Relieving factors: shower and lay on fan.    PRECAUTIONS: Other: anal cancer   WEIGHT BEARING RESTRICTIONS No   FALLS:  Has patient fallen in last 6 months? No   LIVING ENVIRONMENT: Lives with: lives alone   OCCUPATION: none   PLOF: Independent   PATIENT GOALS understand how to manage the pain, understand the changes the pelvis goes through and what to do   PERTINENT HISTORY:  Rheumatoid arthritis ([place]), anal cancer   BOWEL MOVEMENT Pain with bowel movement: Yes at level 7/10 Type of bowel movement:Type (Bristol Stool Scale) type 4 , Frequency every other day, and Strain Yes Fully empty rectum: No Leakage: yes, started 3 days ago 03/26/2022 Pads: No Fiber supplement: Yes: miralax 2x/day   URINATION Pain with urination: No Fully empty bladder: Yes:   Stream: Strong Urgency: Yes: not able to hold the urine Frequency: 1.5 to 2 hours Leakage: Urge to void, Walking to the bathroom, Coughing, Sneezing, and Bending forward Pads: No   INTERCOURSE Pain with intercourse: Initial Penetration and During Penetration Ability to have vaginal  penetration:  Yes: clitorus is sensitive Climax: no Marinoff Scale: 1/3         OBJECTIVE:    DIAGNOSTIC FINDINGS:  none   PATIENT SURVEYS:  PFIQ-7 129; UIQ-7 43; CRAIQ-7 43; POPIQ-7 43   COGNITION:            Overall cognitive status: Within functional limits for tasks assessed                          SENSATION:            Light touch: Appears intact            Proprioception: Appears intact                  POSTURE: No Significant postural limitations               PELVIC ALIGNMENT:   LUMBARAROM/PROM   A/PROM A/PROM  eval  Flexion Decreased by 25%  Extension full  Right lateral flexion full  Left lateral flexion full  Right rotation full  Left rotation full   (Blank rows = not tested)   LOWER EXTREMITY ROM:   Passive ROM Right eval Left eval  Hip flexion 120 110  Hip extension 10 10  Hip abduction 20 25  Hip internal rotation 10 10  Hip external rotation 40 50   (Blank rows = not tested)   LOWER EXTREMITY MMT:   MMT Right eval Left eval  Hip flexion 4/5 4/5  Hip extension 4/5 4/5  Hip abduction 4/5 4/5  Hip internal rotation 4/5 4/5  Hip external rotation 4/5 4/5     PALPATION:   General                   External Perineal Exam tenderness located on the labia majora, labia minora, clitorus, and vulva  Internal Pelvic Floor tenderness located in the introitus, only placed index finger in 1/4 inch to assess the strength   Patient confirms identification and approves PT to assess internal pelvic floor and treatment Yes   PELVIC MMT:   MMT eval  Vaginal 2/5 and circular but no lift holding for 5 sec  Internal Anal Sphincter    External Anal Sphincter    Puborectalis    Diastasis Recti    (Blank rows = not tested)         TONE: Increased due to pain   PROLAPSE: None noted   TODAY'S TREATMENT  06/11/2022 Manual: Soft tissue mobilization: using the [place] to the gluteal and levator ani in  [place] Educated patient on how to perform manual work to the levator ani in [place] and the gluteals to relax the muscles and reduce pain Neuromuscular re-education: Down training: diaphragmatic breathing to reduce tension in the pelvic floor for toileting Self-care: Educated patient on how to use the vaginal dilators, how to progress them and continue for 2 years.     05/07/2022 Exercises: Stretches/mobility:Quadruped cat cow Childs pose with pillows to help her in the position Quadruped hip sway.  Single knee to chest holding 30 sec. 2 times per day Piriformis stretch supine hold 30 sec each leg Hookly trunk rotation 5 times each side Hamstring stretch holding 15 sec bil.  Self-care: Educated patient on using a cream around the anus after she has a bowel movement to reduce her pain.  Educated patient on how salads and vegetables can increase the frequency of bowel movements Educated patient to not use heat on the rectum due to radiated skin Educated patient on walking program and her goal is to walk to the circle K    03/26/2022 Manual: Soft tissue mobilization:circular massage to the abdomen to assist with peristalic motion of the intestines then educated patient on how to perform at home. Lifting the tissue off the suprapubic bone to release around the bladder and educated patient on how to perform at home.  Myofascial release:fascial release in the lower abdomen to reduce the restrictions and firmness around the bladder.  Exercises: Stretches/mobility:single knee to chest  bil. 15 sec                                     Supine figure 4 holding for 30 sec bil.                                      Supine hamstring stretch hold 30 sec                                      Supine trunk rotation bil. Hold 30 sec                                     Supine butterfly stretch hold 30 sec                                     Supine hip flexor stretch hold 30 sec bil.  Therapeutic  activities: Functional strengthening activities:sit  to stand with trunk leaning forward                          Reviewed toileting technique and contract 5 times after bowel movement Self care Educated patient to not drink 2 hours prior to bed time and elevate her legs 30 minutes prior to bed time to reduce the number of night time voids.        PATIENT EDUCATION:  05/07/2022 Education details: Access Code: [email or web address]; using a cream around the rectum, walking program, diet, no heat around the rectum Person educated: Patient Education method: Explanation, Demonstration, Tactile cues, Verbal cues, and Handouts Education comprehension: verbalized understanding, returned demonstration, verbal cues required, tactile cues required, and needs further education     HOME EXERCISE PROGRAM: 06/11/2022  Access Code: [facility], educated patient on how to use the dilator and progress.  URL: [email or web address] Date: 05/07/2022 Prepared by: Earlie Counts   Program Notes lay on back with pillows under kneesLay on side with pillows between knees and feet.    Exercises - Seated Pelvic Floor Contraction  - 3 x daily - 7 x weekly - 1 sets - 5 reps - 5 sec hold - Hooklying Single Knee to Chest Stretch  - 2 x daily - 7 x weekly - 1 sets - 2 reps - 1 sec hold - Supine Figure 4 Piriformis Stretch  - 2 x daily - 7 x weekly - 1 sets - 2 reps - 30 sec hold - Supine Hamstring Stretch  - 2 x daily - 7 x weekly - 1 sets - 2 reps - 30 sec hold - Supine Lower Trunk Rotation  - 2 x daily - 7 x weekly - 1 sets - 2 reps - 30 sec hold - Cat Cow  - 1 x daily - 7 x weekly - 3 sets - 10 reps - Supine Bridge  - 1 x daily - 7 x weekly - 1 sets - 10 reps   ASSESSMENT:   CLINICAL IMPRESSION: Patient is a 31 y.o. female who was seen today for physical therapy  treatment for anal cancer. Patient is having pain in penile penetration vaginally. Therapist educated her on how to use the vaginal dilators and how to  progress with them. Patient has difficulty with relaxation of the anus with bowel movements causing increased pain and muscle spasm.   Patient will benefit from skilled therapy to improve mobility, strength and how to manage the changes from radiation.      OBJECTIVE IMPAIRMENTS decreased activity tolerance, decreased coordination, decreased endurance, decreased ROM, decreased strength, increased fascial restrictions, and pain.    ACTIVITY LIMITATIONS sitting, standing, squatting, transfers, bed mobility, continence, toileting, and locomotion level   PARTICIPATION LIMITATIONS: cleaning, laundry, interpersonal relationship, and community activity   PERSONAL FACTORS Fitness, Past/current experiences, Time since onset of injury/illness/exacerbation, and 3+ comorbidities:    Rheumatoid arthritis ([place]), anal cancer with present chemotherapy and radiation treatment are also affecting patient's functional outcome.    REHAB POTENTIAL: Excellent   CLINICAL DECISION MAKING: Evolving/moderate complexity   EVALUATION COMPLEXITY: Moderate     GOALS: Goals reviewed with patient? Yes   SHORT TERM GOALS: Target date: 04/14/2022   Patient understands hip stretching program to reduce the tightness that happens from radiation.  Baseline: not educated yet Goal status: Met 03/26/2022   2.  Patient understands correct toileting technique to reduce pain with bowel movements.  Baseline: not educated yet Goal status: Met 03/26/2022  3.  Patient understands how to perform diaphragmatic breathing to elongate the pelvic floor for a bowel movement.  Baseline: not educated yet Goal status: Met 06/11/2022   4.  Patient understands the importance of a walking program to manage endurance and balance.  Baseline: not educated yet Goal status: Met 05/06/2022   5.  Patient was educated on rectal health.  Baseline: not educated yet Goal status: Met 05/06/2022     LONG TERM GOALS: Target date: 07/17/2022     Patient independent with advanced HEP for core and hip strength.  Baseline: not educated yet Goal status: INITIAL   2.  Pelvic floor strength >/= 3/5 to reduce her urinary leakage >/= 80%.  Baseline: strength is 2/5 and leaks with urge to void, walking to the bathroom, coughing, sneezing, and bending forward. Goal status: INITIAL   3.  Patient reports her pain with bowel movements decreased >/= 2/10 due to healing of the tissue and correct relaxation of the pelvic floor.  Baseline: pain level 6/10 Goal status: INITIAL   4.  Patient understands how to use the vaginal dilators to keep the opening of the introitus after radiation.  Baseline: not educated yet.  Goal status: INITIAL       PLAN: PT FREQUENCY: 1x/week   PT DURATION: other: for 4 months up to 12 visitis.    PLANNED INTERVENTIONS: Therapeutic exercises, Therapeutic activity, Neuromuscular re-education, Balance training, Patient/Family education, Self Care, Biofeedback, and Manual therapy   PLAN FOR NEXT SESSION: see how it is going with the dilators, internal work to the vaginal canal to assess the levator ani, toiletings    Earlie Counts, PT 06/11/22 10:32 AM

## 2022-06-12 ENCOUNTER — Other Ambulatory Visit: Payer: Self-pay

## 2022-06-15 ENCOUNTER — Encounter: Payer: No Typology Code available for payment source | Admitting: Obstetrics and Gynecology

## 2022-06-18 ENCOUNTER — Encounter: Payer: Self-pay | Admitting: Physical Therapy

## 2022-06-18 ENCOUNTER — Encounter: Payer: Self-pay | Attending: Radiation Oncology | Admitting: Physical Therapy

## 2022-06-18 DIAGNOSIS — M6281 Muscle weakness (generalized): Secondary | ICD-10-CM

## 2022-06-18 DIAGNOSIS — R102 Pelvic and perineal pain: Secondary | ICD-10-CM

## 2022-06-18 DIAGNOSIS — M25651 Stiffness of right hip, not elsewhere classified: Secondary | ICD-10-CM

## 2022-06-18 DIAGNOSIS — M25652 Stiffness of left hip, not elsewhere classified: Secondary | ICD-10-CM

## 2022-06-18 NOTE — Therapy (Signed)
OUTPATIENT PHYSICAL THERAPY TREATMENT NOTE   Patient Name: Tammy Garrison MRN: [id number] DOB:1991/04/23, 31 y.o., female Today's Date: 06/18/2022  PCP: None   REFERRING PROVIDER: Hayden Pedro, PA-C   END OF SESSION:   PT End of Session - 06/18/22 1034     Visit Number 5    Date for PT Re-Evaluation 07/17/22    Authorization Type self pay    PT Start Time 1030    PT Stop Time 1115    PT Time Calculation (min) 45 min    Activity Tolerance Patient tolerated treatment well    Behavior During Therapy [facility] for tasks assessed/performed             Past Medical History:  Diagnosis Date   Bipolar 1 disorder ([place])    ETOH abuse    H/O self-harm    H/O suicide attempt    x 5 - last 05/2016 - overdose Ibuprofen   Marijuana abuse    Rheumatoid arthritis ([place])    Past Surgical History:  Procedure Laterality Date   HEMORRHOID SURGERY N/A 02/08/2022   Procedure: EXTERNAL AND INTERNAL HEMORRHOIDECTOMY;  Surgeon: Georganna Skeans, MD;  Location: [place];  Service: General;  Laterality: N/A;   INCISION AND DRAINAGE ABSCESS Right 04/22/2022   Procedure: INCISION AND DRAINAGE axilla;  Surgeon: Kieth Brightly, Arta Bruce, MD;  Location: [facility];  Service: General;  Laterality: Right;   Patient Active Problem List   Diagnosis Date Noted   Fibroids 04/23/2022   Obesity (BMI 30-39.9) 04/22/2022   Abscess of right axilla 04/20/2022   Pancytopenia ([place]) 04/20/2022   PICC (peripherally inserted central catheter) in place 03/14/2022   Dehydration 03/13/2022   Nausea with vomiting 03/13/2022   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   Rationale for Evaluation and Treatment Rehabilitation   ONSET DATE: 02/08/2022   SUBJECTIVE:                                                                                                                                                                                            SUBJECTIVE STATEMENT: I am using the third dilator and pain level is 5/10. Over the weekend tried to have a bowel  movement and had bleeding.    PAIN:  Are you having pain? Yes NPRS scale: 8/10 Pain location: Anterior, rectum   Pain type: burning and cramping Pain description: intermittent    Aggravating factors: difficulty with sitting, when she feels like she has to use the restroom.  Relieving factors: shower and lay on fan.    PRECAUTIONS: Other: anal cancer   WEIGHT BEARING RESTRICTIONS No   FALLS:  Has patient fallen in last 6 months? No   LIVING ENVIRONMENT: Lives with: lives alone   OCCUPATION: none   PLOF: Independent   PATIENT GOALS understand how to manage the pain, understand the changes the pelvis goes through and what to do   PERTINENT HISTORY:  Rheumatoid arthritis ([place]), anal cancer   BOWEL MOVEMENT Pain with bowel movement: Yes at level 7/10 Type of bowel movement:Type (Bristol Stool Scale) type 4 , Frequency every other day, and Strain Yes Fully empty rectum: No Leakage: yes, started 3 days ago 03/26/2022 Pads: No Fiber supplement: Yes: miralax 2x/day   URINATION Pain with urination: No Fully empty bladder: No when stands up  more urine comes out:   Stream: Strong Urgency: Yes: not able to hold the urine Frequency: 1.5 to 2 hours Leakage: Urge to void, Walking to the bathroom, Coughing, Sneezing, and Bending forward Pads: No   INTERCOURSE Pain with intercourse: Initial Penetration and During Penetration Ability to have vaginal penetration:  Yes: clitorus is  sensitive Climax: no Marinoff Scale: 1/3        OBJECTIVE: (objective measures completed at initial evaluation unless otherwise dated)   (Copy Eval's Objective through Plan section here)     DIAGNOSTIC FINDINGS:  none   PATIENT SURVEYS:  PFIQ-7 129; UIQ-7 43; CRAIQ-7 43; POPIQ-7 43   COGNITION:            Overall cognitive status: Within functional limits for tasks assessed                          SENSATION:            Light touch: Appears intact            Proprioception: Appears intact                  POSTURE: No Significant postural limitations               PELVIC ALIGNMENT:   LUMBARAROM/PROM   A/PROM A/PROM  eval  Flexion Decreased by 25%  Extension full  Right lateral flexion full  Left lateral flexion full  Right rotation full  Left rotation full   (Blank rows = not tested)   LOWER EXTREMITY ROM:   Passive ROM Right eval Left eval  Hip flexion 120 110  Hip extension 10 10  Hip abduction 20 25  Hip internal rotation 10 10  Hip external rotation 40 50   (Blank rows = not tested)   LOWER EXTREMITY MMT:   MMT Right eval Left eval  Hip flexion 4/5 4/5  Hip extension 4/5 4/5  Hip abduction 4/5 4/5  Hip internal rotation 4/5 4/5  Hip external rotation 4/5 4/5     PALPATION:   General                   External Perineal Exam tenderness located on the labia majora, labia minora, clitorus, and vulva  Internal Pelvic Floor tenderness located in the introitus, only placed index finger in 1/4 inch to assess the strength   Patient confirms identification and approves PT to assess internal pelvic floor and treatment Yes   PELVIC MMT:   MMT eval  Vaginal 2/5 and circular but no lift holding for 5 sec  Internal Anal Sphincter    External Anal Sphincter    Puborectalis    Diastasis Recti    (Blank rows = not tested)         TONE: Increased due to pain   PROLAPSE: None noted   TODAY'S TREATMENT   06/18/2022 Manual: Soft tissue mobilization: patient laying on left side and therapist gently placed her fingers between the gluteal folds and performed gentle manual work and monitored for pain  Therapist performed manual work along the ischiocavernosus and pubic rami while monitoring for pain increase. Unable to go to the perineal body due to anxiety and pain.  Neuromuscular re-education: Down training: diaphragmatic breathing to relax the pelvic floor during the manual work Self-care: Educated patient on how to progress the use of the dilators and not to have pain above 3/10 and slowly placing the dilator in.  Educated patient on working between the gluteal folds with her hands to practice opening of the buttocks for an exam of the anus due to the pain and her scared of the pain Educated patient on massaging the ischiocavernosus muscles  Educated patient on talking to her doctors of the pain with spreading her buttock cheeks for the anal exam Educated patient on talking to her gynecologist when she goes for an exam and the pain she is having with dilators. She may do better with a pediatric speculum    06/11/2022 Manual: Soft tissue mobilization: using the [place] to the gluteal and levator ani in [place] Educated patient on how to perform manual work to the levator ani in [place] and the gluteals to relax the muscles and reduce pain Neuromuscular re-education: Down training: diaphragmatic breathing to reduce tension in the pelvic floor for toileting Self-care: Educated patient on how to use the vaginal dilators, how to progress them and continue for 2 years.      05/07/2022 Exercises: Stretches/mobility:Quadruped cat cow Childs pose with pillows to help her in the position Quadruped hip sway.  Single knee to chest holding 30 sec. 2 times per day Piriformis stretch supine hold 30 sec each leg Hookly trunk rotation 5 times each side Hamstring stretch holding 15 sec bil.   Self-care: Educated patient on using a cream around the anus after she has a bowel movement to reduce her pain.  Educated patient on how salads and vegetables can increase the frequency of bowel movements Educated patient to not use heat on the rectum due to radiated skin Educated patient on walking program and her goal is to walk to the circle K        PATIENT EDUCATION:  05/07/2022 Education details: Access Code: [email or web address]; using a cream around the rectum, walking program, diet, no heat around the rectum Person educated: Patient Education method: Explanation, Demonstration, Tactile cues, Verbal cues, and Handouts Education comprehension: verbalized understanding, returned demonstration, verbal cues required, tactile cues required, and needs further education     HOME EXERCISE PROGRAM: 06/11/2022  Access Code: [facility], educated patient on how to use the dilator and progress.  URL: [email or web address] Date: 05/07/2022 Prepared by: Earlie Counts   Program Notes lay on back with pillows under kneesLay on side with pillows between knees and  feet.    Exercises - Seated Pelvic Floor Contraction  - 3 x daily - 7 x weekly - 1 sets - 5 reps - 5 sec hold - Hooklying Single Knee to Chest Stretch  - 2 x daily - 7 x weekly - 1 sets - 2 reps - 1 sec hold - Supine Figure 4 Piriformis Stretch  - 2 x daily - 7 x weekly - 1 sets - 2 reps - 30 sec hold - Supine Hamstring Stretch  - 2 x daily - 7 x weekly - 1 sets - 2 reps - 30 sec hold - Supine Lower Trunk Rotation  - 2 x daily - 7 x weekly - 1 sets - 2 reps - 30 sec hold - Cat Cow  - 1 x daily - 7 x weekly - 3 sets - 10 reps - Supine Bridge  - 1 x daily - 7 x weekly - 1 sets - 10 reps   ASSESSMENT:   CLINICAL IMPRESSION: Patient is a 31 y.o. female who was seen today for physical therapy  treatment for anal cancer.Patient is using the third dilator but will have pain level 5/10. Patient was educated on not having pain higher than  3/10 and go very slowly. She is going to work on bringing her finger s into the gluteal fold and working on separating them She has increased pain and spasms's when therapist fingers are gently placed between the folds without a stretch. Patient has not had spasms after the manual work and understands that we need to keep the muscles relaxed.  Hayden Pedro, PA-C said patient can have internal anal work  as along as she's not having desquamation of her skin  Patient will benefit from skilled therapy to improve mobility, strength and how to manage the changes from radiation.      OBJECTIVE IMPAIRMENTS decreased activity tolerance, decreased coordination, decreased endurance, decreased ROM, decreased strength, increased fascial restrictions, and pain.    ACTIVITY LIMITATIONS sitting, standing, squatting, transfers, bed mobility, continence, toileting, and locomotion level   PARTICIPATION LIMITATIONS: cleaning, laundry, interpersonal relationship, and community activity   PERSONAL FACTORS Fitness, Past/current experiences, Time since onset of injury/illness/exacerbation, and 3+ comorbidities:    Rheumatoid arthritis ([place]), anal cancer with present chemotherapy and radiation treatment are also affecting patient's functional outcome.    REHAB POTENTIAL: Excellent   CLINICAL DECISION MAKING: Evolving/moderate complexity   EVALUATION COMPLEXITY: Moderate     GOALS: Goals reviewed with patient? Yes   SHORT TERM GOALS: Target date: 04/14/2022   Patient understands hip stretching program to reduce the tightness that happens from radiation.  Baseline: not educated yet Goal status: Met 03/26/2022   2.  Patient understands correct toileting technique to reduce pain with bowel movements.  Baseline: not educated yet Goal status: Met 03/26/2022   3.  Patient understands how to perform diaphragmatic breathing to elongate the pelvic floor for a bowel movement.  Baseline: not educated yet Goal  status: Met 06/11/2022   4.  Patient understands the importance of a walking program to manage endurance and balance.  Baseline: not educated yet Goal status: Met 05/06/2022   5.  Patient was educated on rectal health.  Baseline: not educated yet Goal status: Met 05/06/2022     LONG TERM GOALS: Target date: 07/17/2022    Patient independent with advanced HEP for core and hip strength.  Baseline: not educated yet Goal status: INITIAL   2.  Pelvic floor strength >/= 3/5 to reduce her urinary  leakage >/= 80%.  Baseline: strength is 2/5 and leaks with urge to void, walking to the bathroom, coughing, sneezing, and bending forward. Goal status: INITIAL   3.  Patient reports her pain with bowel movements decreased >/= 2/10 due to healing of the tissue and correct relaxation of the pelvic floor.  Baseline: pain level 6/10 Goal status: INITIAL   4.  Patient understands how to use the vaginal dilators to keep the opening of the introitus after radiation.  Baseline: not educated yet.  Goal status: INITIAL       PLAN: PT FREQUENCY: 1x/week   PT DURATION: other: for 4 months up to 12 visitis.    PLANNED INTERVENTIONS: Therapeutic exercises, Therapeutic activity, Neuromuscular re-education, Balance training, Patient/Family education, Self Care, Biofeedback, and Manual therapy   PLAN FOR NEXT SESSION: see how it is going with the dilators, Manual work on the gluteal fold and externally vaginally to relax the muscles and reduce her anxiety with internal work.   Earlie Counts, PT 06/18/22 4:24 PM

## 2022-06-22 ENCOUNTER — Telehealth: Payer: Self-pay

## 2022-06-22 NOTE — Telephone Encounter (Signed)
Contacted patient about PET scan appointment 12/06 @ 230pm patient aware of pre procedure directions, patient viewed details in mychart.

## 2022-06-29 ENCOUNTER — Telehealth: Payer: Self-pay

## 2022-06-29 NOTE — Telephone Encounter (Signed)
L/M to call back for change of appointment. Her appointment was moved from 11/30 to 12/7 at 11:30 labs and 12:00 for appointment with Cira Rue NP

## 2022-06-29 NOTE — Telephone Encounter (Signed)
Created by mistake

## 2022-07-02 ENCOUNTER — Encounter: Payer: Self-pay | Admitting: Physical Therapy

## 2022-07-02 ENCOUNTER — Telehealth: Payer: Self-pay | Admitting: Physical Therapy

## 2022-07-02 ENCOUNTER — Ambulatory Visit: Payer: No Typology Code available for payment source | Admitting: Nurse Practitioner

## 2022-07-02 ENCOUNTER — Other Ambulatory Visit: Payer: No Typology Code available for payment source

## 2022-07-02 NOTE — Telephone Encounter (Signed)
Called patient about her missed appt. Today at 10:30. Left a message.  Earlie Counts, PT '@11'$ /30/2023@ 11:03 AM

## 2022-07-08 ENCOUNTER — Encounter (HOSPITAL_COMMUNITY)
Admission: RE | Admit: 2022-07-08 | Discharge: 2022-07-08 | Disposition: A | Discharge status: Home / Self Care | Payer: Medicaid Other | Source: Ambulatory Visit | Attending: Hematology | Admitting: Hematology

## 2022-07-08 DIAGNOSIS — C210 Malignant neoplasm of anus, unspecified: Secondary | ICD-10-CM | POA: Diagnosis present

## 2022-07-08 LAB — GLUCOSE, CAPILLARY: Glucose-Capillary: 114 mg/dL — ABNORMAL HIGH (ref 70–99)

## 2022-07-08 MED ORDER — FLUDEOXYGLUCOSE F - 18 (FDG) INJECTION
10.0000 | Freq: Once | INTRAVENOUS | Status: AC
  Administered 2022-07-08: 12.31 via INTRAVENOUS

## 2022-07-09 ENCOUNTER — Telehealth: Payer: Self-pay | Admitting: Physical Therapy

## 2022-07-09 ENCOUNTER — Inpatient Hospital Stay: Payer: Medicaid Other | Attending: Hematology

## 2022-07-09 ENCOUNTER — Other Ambulatory Visit (HOSPITAL_COMMUNITY): Payer: Self-pay

## 2022-07-09 ENCOUNTER — Inpatient Hospital Stay (HOSPITAL_BASED_OUTPATIENT_CLINIC_OR_DEPARTMENT_OTHER): Payer: Medicaid Other | Admitting: Nurse Practitioner

## 2022-07-09 ENCOUNTER — Encounter: Payer: Self-pay | Admitting: Hematology

## 2022-07-09 ENCOUNTER — Encounter: Payer: Medicaid Other | Admitting: Physical Therapy

## 2022-07-09 ENCOUNTER — Encounter: Payer: Self-pay | Admitting: Nurse Practitioner

## 2022-07-09 VITALS — BP 137/96 | HR 103 | Temp 97.9°F | Resp 22 | Wt 257.5 lb

## 2022-07-09 DIAGNOSIS — D500 Iron deficiency anemia secondary to blood loss (chronic): Secondary | ICD-10-CM | POA: Diagnosis not present

## 2022-07-09 DIAGNOSIS — Z923 Personal history of irradiation: Secondary | ICD-10-CM | POA: Insufficient documentation

## 2022-07-09 DIAGNOSIS — C210 Malignant neoplasm of anus, unspecified: Secondary | ICD-10-CM | POA: Diagnosis present

## 2022-07-09 DIAGNOSIS — Z9221 Personal history of antineoplastic chemotherapy: Secondary | ICD-10-CM | POA: Insufficient documentation

## 2022-07-09 DIAGNOSIS — Z79899 Other long term (current) drug therapy: Secondary | ICD-10-CM | POA: Diagnosis not present

## 2022-07-09 DIAGNOSIS — N920 Excessive and frequent menstruation with regular cycle: Secondary | ICD-10-CM | POA: Insufficient documentation

## 2022-07-09 LAB — CBC WITH DIFFERENTIAL/PLATELET
Abs Immature Granulocytes: 0.01 10*3/uL (ref 0.00–0.07)
Basophils Absolute: 0 10*3/uL (ref 0.0–0.1)
Basophils Relative: 1 %
Eosinophils Absolute: 0.2 10*3/uL (ref 0.0–0.5)
Eosinophils Relative: 4 %
HCT: 38.3 % (ref 36.0–46.0)
Hemoglobin: 12.5 g/dL (ref 12.0–15.0)
Immature Granulocytes: 0 %
Lymphocytes Relative: 12 %
Lymphs Abs: 0.5 10*3/uL — ABNORMAL LOW (ref 0.7–4.0)
MCH: 30.5 pg (ref 26.0–34.0)
MCHC: 32.6 g/dL (ref 30.0–36.0)
MCV: 93.4 fL (ref 80.0–100.0)
Monocytes Absolute: 0.4 10*3/uL (ref 0.1–1.0)
Monocytes Relative: 8 %
Neutro Abs: 3.2 10*3/uL (ref 1.7–7.7)
Neutrophils Relative %: 75 %
Platelets: 277 10*3/uL (ref 150–400)
RBC: 4.1 MIL/uL (ref 3.87–5.11)
RDW: 14.1 % (ref 11.5–15.5)
WBC: 4.3 10*3/uL (ref 4.0–10.5)
nRBC: 0 % (ref 0.0–0.2)

## 2022-07-09 LAB — FERRITIN: Ferritin: 49 ng/mL (ref 11–307)

## 2022-07-09 LAB — COMPREHENSIVE METABOLIC PANEL
ALT: 18 U/L (ref 0–44)
AST: 15 U/L (ref 15–41)
Albumin: 4 g/dL (ref 3.5–5.0)
Alkaline Phosphatase: 74 U/L (ref 38–126)
Anion gap: 7 (ref 5–15)
BUN: 10 mg/dL (ref 6–20)
CO2: 28 mmol/L (ref 22–32)
Calcium: 9.8 mg/dL (ref 8.9–10.3)
Chloride: 106 mmol/L (ref 98–111)
Creatinine, Ser: 0.87 mg/dL (ref 0.44–1.00)
GFR, Estimated: >60 mL/min (ref >60)
Glucose, Bld: 109 mg/dL — ABNORMAL HIGH (ref 70–99)
Potassium: 3.8 mmol/L (ref 3.5–5.1)
Sodium: 141 mmol/L (ref 135–145)
Total Bilirubin: 0.3 mg/dL (ref 0.3–1.2)
Total Protein: 7.4 g/dL (ref 6.5–8.1)

## 2022-07-09 MED ORDER — METHYLPREDNISOLONE 4 MG PO TBPK
ORAL_TABLET | ORAL | 0 refills | Status: AC
  Filled 2022-07-09: qty 21, 6d supply, fill #0

## 2022-07-09 MED ORDER — GABAPENTIN 300 MG PO CAPS
300.0000 mg | ORAL_CAPSULE | Freq: Two times a day (BID) | ORAL | 0 refills | Status: DC
  Filled 2022-07-09: qty 60, 30d supply, fill #0

## 2022-07-09 NOTE — Progress Notes (Addendum)
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 SOFT TISSUE, ANTERIOR MASS VS. HEMORRHOID, EXCISION:  Invasive squamous cell carcinoma, focally keratinizing and  well-differentiated.  Carcinoma is present at the margins of excision.   B. HEMORRHOID, INTERNAL, EXCISION:  Hemorrhoid without the overlying epithelium.  Separate fragment of benign squamous epithelium.  Negative for neoplasm.   ADDENDUM:  P16 immunostain is strongly positive suggestive of this lesion being the HPV related.    02/08/2022 Cancer Staging   Staging form: Anus, AJCC V9 - Clinical stage from 02/08/2022: Stage IIA (cT2, cN0, cM0) - Signed by Truitt Merle, MD on 02/23/2022 Stage prefix: Initial diagnosis   02/23/2022 Initial Diagnosis   Anal cancer ([place])   03/09/2022 - 03/13/2022 Chemotherapy   Patient is on Treatment Plan : ANUS Mitomycin D1,28 / 5FU D1-4, 28-31 q32d     03/09/2022 -  Chemotherapy   Patient is on Treatment Plan : ANUS Mitomycin D1,28 + 5FU D1-4, 28-31 q32d      PET scan   IMPRESSION: 1. There is persistent increased radiotracer uptake associated with the anus. The degree of uptake is similar to the previous exam. Cannot exclude residual FDG avid tumor. 2. No signs of solid organ or nodal metastasis within the chest, abdomen or pelvis. 3. Interval resolution of previous FDG avid left axillary lymph nodes. 4. Interval increase in radiotracer uptake associated with anterior mediastinal soft tissue. The  configuration of the soft tissue is consistent with the thymus gland. 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 5. Interval development of multiple FDG avid cutaneous soft tissue nodules within the anterior left shoulder, ventral chest wall, and anteromedial left thigh. These are nonspecific and may be inflammatory or infectious in etiology. Metastatic disease is considered less favored. Correlation with physical exam findings is advised. 6. Enlarged uterus containing degenerating fibroids.     CURRENT THERAPY: Surveillance  INTERVAL HISTORY: Ms Tammy Garrison returns for follow-up as scheduled to review recent PET scan.  She completed chemo RT 04/20/2022 but had significant residual pain so the posttreatment PET scan was moved up. She remains in constant pain, worse with BMs which are difficult and take up to 5 trips to bathroom to complete. In the past 2 weeks she has recurrent bloody stools. She sleeps on the couch and can't do much else out the day because pain from the bowel movements is exhausting. Tylenol is not effective. Not eating or drinking well, although she knows she should.  She has frequent nausea but no vomiting.  Denies abdominal pain/bloating.  She has scattered "boils" on her skin 1 in the right low neck/shoulder has been there several months.  She recently had 1 drained from the left axilla, and 1 on the anterior chest near the left breast is getting smaller.  All other systems were reviewed with the patient and are negative.  MEDICAL HISTORY:  Past Medical History:  Diagnosis Date   Bipolar 1 disorder ([place])    ETOH abuse  H/O self-harm    H/O suicide attempt    x 5 - last 05/2016 - overdose Ibuprofen   Marijuana abuse    Rheumatoid arthritis ([place])     SURGICAL HISTORY: Past Surgical History:  Procedure Laterality Date   HEMORRHOID SURGERY N/A 02/08/2022   Procedure: EXTERNAL AND INTERNAL HEMORRHOIDECTOMY;   Surgeon: Georganna Skeans, MD;  Location: [facility];  Service: General;  Laterality: N/A;   INCISION AND DRAINAGE ABSCESS Right 04/22/2022   Procedure: INCISION AND DRAINAGE axilla;  Surgeon: Kinsinger, Arta Bruce, MD;  Location: [facility];  Service: General;  Laterality: Right;    I have reviewed the social history and family history with the patient and they are unchanged from previous note.  ALLERGIES:  is allergic to coconut flavor.  MEDICATIONS:  Current Outpatient Medications  Medication Sig Dispense Refill   acetaminophen (TYLENOL) 500 MG tablet Take 500 mg by mouth every 6 (six) hours as needed for moderate pain.     Calcium Carbonate Antacid (TUMS PO) Take 2-3 tablets by mouth daily as needed (GI upset).     doxylamine, Sleep, (SLEEP AID) 25 MG tablet Take 25 mg by mouth at bedtime.     gabapentin (NEURONTIN) 300 MG capsule Take 1 capsule (300 mg total) by mouth 2 (two) times daily. 60 capsule 0   methylPREDNISolone (MEDROL DOSEPAK) 4 MG TBPK tablet Take 6 tablets (24 mg total) by mouth daily for 1 day, THEN 5 tablets (20 mg total) daily for 1 day, THEN 4 tablets (16 mg total) daily for 1 day, THEN 3 tablets (12 mg total) daily for 1 day, THEN 2 tablets (8 mg total) daily for 1 day, THEN 1 tablet (4 mg total) daily for 1 day. 21 tablet 0   ibuprofen (ADVIL) 200 MG tablet Take 200 mg by mouth every 6 (six) hours as needed for moderate pain. (Patient not taking: Reported on 07/09/2022)     lidocaine (REGENECARE HA) 2 % jelly Apply 1 Application topically daily. (Patient not taking: Reported on 07/09/2022) 85 g 0   Loperamide HCl (IMODIUM PO) Take 2 tablets by mouth daily as needed (diarrhea). (Patient not taking: Reported on 07/09/2022)     LORazepam (ATIVAN) 0.5 MG tablet Take 1 tablet (0.5 mg total) by mouth every 6 (six) hours as needed (nausea). (Patient not taking: Reported on 07/09/2022) 20 tablet 0   megestrol (MEGACE) 40 MG tablet Take 1 tablet (40 mg total) by mouth 2 (two) times daily.  (Patient not taking: Reported on 05/19/2022) 28 tablet 0   metroNIDAZOLE (FLAGYL) 500 MG tablet Take 1 tablet (500 mg total) by mouth 2 (two) times daily. (Patient not taking: Reported on 07/09/2022) 14 tablet 0   promethazine (PHENERGAN) 25 MG tablet Take 1 tablet (25 mg total) by mouth every 6 (six) hours as needed for nausea or vomiting. (Patient not taking: Reported on 07/09/2022) 30 tablet 1   No current facility-administered medications for this visit.   Facility-Administered Medications Ordered in Other Visits  Medication Dose Route Frequency Provider Last Rate Last Admin   0.9 %  sodium chloride infusion (Manually program via Guardrails IV Fluids)  250 mL Intravenous Once Truitt Merle, MD       heparin lock flush 100 unit/mL  250 Units Intracatheter Once Truitt Merle, MD       sodium chloride flush (NS) 0.9 % injection 10 mL  10 mL Intracatheter Once Truitt Merle, MD       sodium chloride flush (NS) 0.9 % injection 10  mL  10 mL Intracatheter Once Truitt Merle, MD       sodium chloride flush (NS) 0.9 % injection 10 mL  10 mL Intracatheter Once Truitt Merle, MD        PHYSICAL EXAMINATION: ECOG PERFORMANCE STATUS: 2-3   Vitals:   07/09/22 1209  BP: (!) 137/96  Pulse: (!) 103  Resp: (!) 22  Temp: 97.9 F (36.6 C)  SpO2: 100%   Filed Weights   07/09/22 1209  Weight: 257 lb 8 oz (116.8 kg)    GENERAL:alert, no distress and comfortable SKIN: No rash.  Cutaneous nodule to the right low neck/shoulder and anterior chest wall near the left breast EYES: sclera clear NECK: Without mass LYMPH:  no palpable focal or supraclavicular lymphadenopathy  LUNGS: clear with normal breathing effort HEART: regular rate & rhythm, no lower extremity edema ABDOMEN: abdomen soft, non-tender and normal bowel sounds NEURO: alert & oriented x 3 with fluent speech, no focal motor/sensory deficits Anorectal exam deferred due to pain  LABORATORY DATA:  I have reviewed the data as listed    Latest Ref Rng & Units  07/09/2022   11:31 AM 06/10/2022    8:54 AM 05/19/2022    2:25 AM  CBC  WBC 4.0 - 10.5 K/uL 4.3  5.6  6.1   Hemoglobin 12.0 - 15.0 g/dL 12.5  12.0  10.5   Hematocrit 36.0 - 46.0 % 38.3  36.7  32.7   Platelets 150 - 400 K/uL 277  288  397         Latest Ref Rng & Units 07/09/2022   11:31 AM 06/10/2022    8:54 AM 05/18/2022    7:39 PM  CMP  Glucose 70 - 99 mg/dL 109  101  103   BUN 6 - 20 mg/dL '10  15  8   '$ Creatinine 0.44 - 1.00 mg/dL 0.87  0.88  0.77   Sodium 135 - 145 mmol/L 141  139  136   Potassium 3.5 - 5.1 mmol/L 3.8  3.9  3.7   Chloride 98 - 111 mmol/L 106  105  106   CO2 22 - 32 mmol/L '28  26  24   '$ Calcium 8.9 - 10.3 mg/dL 9.8  9.7  9.2   Total Protein 6.5 - 8.1 g/dL 7.4  7.4  7.7   Total Bilirubin 0.3 - 1.2 mg/dL 0.3  0.4  0.3   Alkaline Phos 38 - 126 U/L 74  62  61   AST 15 - 41 U/L '15  14  16   '$ ALT 0 - 44 U/L '18  16  15       '$ RADIOGRAPHIC STUDIES: I have personally reviewed the radiological images as listed and agreed with the findings in the report. NM PET Image Restag (PS) Skull Base To Thigh  Result Date: 07/09/2022 CLINICAL DATA:  Subsequent treatment strategy for anal cancer. EXAM: NUCLEAR MEDICINE PET SKULL BASE TO THIGH TECHNIQUE: 12.3 mCi F-18 FDG was injected intravenously. Full-ring PET imaging was performed from the skull base to thigh after the radiotracer. CT data was obtained and used for attenuation correction and anatomic localization. Fasting blood glucose: 114 mg/dl COMPARISON:  PET-CT 02/22/2022 FINDINGS: Mediastinal blood pool activity: SUV max 2.67 Liver activity: SUV max NA NECK: No hypermetabolic lymph nodes in the neck. Incidental CT findings: None. CHEST: Soft tissue within the anterior mediastinum measures 4.2 x 2.0 cm and has an SUV max of 6.08, image 66/4. On the previous PET-CT this had a  similar morphologic appearance measuring 4.1 by 1.8 cm with SUV max 4.24. Index left axillary lymph node measures 0.8 cm with SUV max of 1.95 image 55/4.  Previously 1.5 cm with SUV max of 6.57. Index left retropectoral lymph node measures 0.5 cm with SUV max of 1.95, image 48/4. formally this measured 1.2 cm with SUV max of 5.04. No tracer avid mediastinal or hilar adenopathy. No suspicious lung nodules identified. Incidental CT findings: None. ABDOMEN/PELVIS: No abnormal uptake identified within the liver, pancreas, or spleen. No tracer avid abdominal or pelvic lymph nodes. No tracer avid inguinal lymph nodes. Increased tracer uptake is again noted at the level of the anus with SUV max of 10.31, image 194/4. Formally SUV max was equal to 9.44. Incidental CT findings: There is an enlarged fibroid uterus containing several degenerating fibroids containing central low attenuation. SKELETON: No tracer avid bone metastases identified. Interval development of multiple FDG avid cutaneous soft tissue nodules, including: -Within the anterior left shoulder there is a cutaneous nodule with adjacent mild skin thickening measuring 1.5 x 1.0 cm with SUV max of 11.22, image 49/4. -Within the ventral chest wall, just left of the midline, there is a cutaneous nodule measuring 1.3 cm with SUV max 8.49, image 80/4. -Within the anteromedial left thigh there is a cutaneous soft tissue nodule measuring 0.9 cm within SUV max of 11.63. Incidental CT findings: None. IMPRESSION: 1. There is persistent increased radiotracer uptake associated with the anus. The degree of uptake is similar to the previous exam. Cannot exclude residual FDG avid tumor. 2. No signs of solid organ or nodal metastasis within the chest, abdomen or pelvis. 3. Interval resolution of previous FDG avid left axillary lymph nodes. 4. Interval increase in radiotracer uptake associated with anterior mediastinal soft tissue. The configuration of the soft tissue is consistent with the thymus gland. 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 5. Interval  development of multiple FDG avid cutaneous soft tissue nodules within the anterior left shoulder, ventral chest wall, and anteromedial left thigh. These are nonspecific and may be inflammatory or infectious in etiology. Metastatic disease is considered less favored. Correlation with physical exam findings is advised. 6. Enlarged uterus containing degenerating fibroids. Electronically Signed   By: Kerby Moors M.D.   On: 07/09/2022 11:01     ASSESSMENT & PLAN: Tammy Garrison is a 31 y.o. female with    1. Anal Cancer, cT2N0Mx, with hypermetabolic left axillary nodes, HPV(+) -presented with worsening rectal bleeding, began passing clots and was admitted 02/06/22 for anemia from blood loss. S/p hemorrhoidectomy on 02/08/22 by Dr. 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 -PET scan 02/26/22 showed: intense uptake to anus; no signs of solid organ or FDG-avid nodal metastasis within abdomen or pelvis, hypermetabolic left axillary lymph nodes.  Sequently left axillary lymph node biopsy was benign. -she established care with Dr. Dema Severin on 03/03/22. -s/p concurrent chemoRT with mitomycin/5FU 03/09/22 - 04/20/22, tolerated moderately well with significant rectal pain. -Ms. Lao has residual severe rectal pain, limiting her quality of life and daily functioning. She is not eating/drinking as much to avoid BM, with mild weight loss, and she has recurrent bloody stools in past 2 weeks -She underwent posttreatment PET scan 07/08/2022 which shows persistent intense hypermetabolism in the anus similar to pre-treatment PET scan, concerning for residual disease. No other distant metastasis. -We  reviewed the other PET scan findings, specifically the hypermetabolic cutaneous nodules which are felt to be infectious -She was seen by Dr. Dema Severin yesterday, could not tolerate annual exam and is being scheduled for anoscopy  under anesthesia and possible biopsy, we will discuss with him -We briefly discussed if she has confirmed residual cancer, the recommendation would likely be surgery   2. Symptom Management: Rectal pain/discomfort -she reports persistent pain with BM and sitting, adding it initially improved but then worsened. -she was unable to tolerate anoscope at f/u with Dr. Dema Severin on 06/01/22 and again on 12/6; she is being scheduled for exam under anesthesia. -Tylenol is not effective.  We discussed pain management.  In efforts to avoid constipation I prescribed Medrol Dosepak and gabapentin.  -I also recommend referral to palliative care for pain management, patient agrees   3. Anemia, from vaginal bleeding and iron deficiency -low hgb dating back to 04/2018, prior to that in 2017 was WNL. She has heavy periods, requiring pad/tampon change every 30 minutes. -she has required IV Feraheme and pRBC. -hgb improved to 12 today (06/10/22) -Follow-up with gynecologist.   -Normal now   PLAN: -labs and PET scan reviewed -Proceed with exam under anesthesia and biopsy with Dr. Dema Severin -Rx: medrol dose pack and gabapentin for rectal pain -Palliative care referral for pain management -F/up pending Dr. Orest Dikes findings/recommendations -Pt seen with Dr. Burr Medico     All questions were answered. The patient knows to call the clinic with any problems, questions or concerns. No barriers to learning was detected.     Alla Feeling, NP 07/09/22   Addendum  I have seen the patient, examined her. I agree with the assessment and and plan and have edited the notes.   Pt returns for follow up, unfortunately she still has significant rectal pain.  I reviewed her PET scan image myself, and discussed the findings with patient.  She has significant residual metabolic activity at the lower rectum and anal canal, concerning for residual disease.  No definitive evidence of distant metastasis, her hypermetabolic subcutaneous soft  tissue lesions likely skin infection.  She was seen by her colorectal surgeon Dr. Dema Severin yesterday, and will be scheduled for rectal exam and biopsy under general anesthesia.  She is not able to tolerate rectal exam.  We discussed that this she will need APR if she has residual disease on exam.  Patient became emotional when she heard that she may need a permanent colostomy.  Emotional support given.  Will wait for her rectal exam and biopsy. Next f/u open for now. We discussed pain management and will refer her to palliative care clinic in our office.   Truitt Merle MD 07/09/2022

## 2022-07-09 NOTE — Telephone Encounter (Signed)
Called patient to discuss plan of care. Decided to put patient on hold until she sees the doctors to examine her further.  Earlie Counts, PT '@12'$ /01/2022@ 4:18 PM

## 2022-07-10 ENCOUNTER — Other Ambulatory Visit: Payer: Self-pay

## 2022-07-10 ENCOUNTER — Telehealth: Payer: Self-pay | Admitting: Hematology

## 2022-07-10 ENCOUNTER — Other Ambulatory Visit: Payer: Self-pay | Admitting: Hematology

## 2022-07-10 DIAGNOSIS — C210 Malignant neoplasm of anus, unspecified: Secondary | ICD-10-CM

## 2022-07-10 NOTE — Telephone Encounter (Signed)
Called patient to schedule f/u. Patient notified of new upcoming appointment.

## 2022-07-12 ENCOUNTER — Ambulatory Visit (HOSPITAL_COMMUNITY)
Admission: RE | Admit: 2022-07-12 | Discharge: 2022-07-12 | Disposition: A | Discharge status: Home / Self Care | Payer: Medicaid Other | Source: Ambulatory Visit | Attending: Hematology | Admitting: Hematology

## 2022-07-12 DIAGNOSIS — C210 Malignant neoplasm of anus, unspecified: Secondary | ICD-10-CM | POA: Diagnosis present

## 2022-07-14 ENCOUNTER — Encounter: Payer: Self-pay | Admitting: Hematology

## 2022-07-14 ENCOUNTER — Telehealth: Payer: Self-pay

## 2022-07-14 ENCOUNTER — Inpatient Hospital Stay: Payer: Medicaid Other | Admitting: Licensed Clinical Social Worker

## 2022-07-14 DIAGNOSIS — C210 Malignant neoplasm of anus, unspecified: Secondary | ICD-10-CM

## 2022-07-14 NOTE — Progress Notes (Signed)
[facility] CSW Progress Note  Clinical Education officer, museum contacted patient by phone to discuss concerns regarding extension of food stamps.  Pt was able to get a letter from Dr. Burr Medico confirming she will not be able to return to work for the next 3 months.  Pt confirmed she has finally be approved for Medicaid which has been a relief as the financial strain from the medical bills has been significant.  CSW encouraged pt to check if she may be eligible for SSI benefits now that Medicaid has been approved for additional financial support which pt will follow up on.  Pt unfortunately has continued to experience pain since ending treatment and has been informed she will need to undergo surgery.  Emotional support provided.  Pt will undergo an outpt procedure on 12/19 and is anticipated to have a bigger surgery after the first of the year.  CSW to call pt following the outpt procedure to set up a counseling appointment and will continue to provide support to pt throughout duration of treatment as appropriate.     Henriette Combs, LCSW

## 2022-07-14 NOTE — Telephone Encounter (Signed)
Gave patient a note for work

## 2022-07-14 NOTE — Progress Notes (Signed)
Patient called regarding documentation she needs completed for food stamps. Provided number to Manuela Schwartz in social work at [phone]. She was very Patent attorney.  She has my card for any additional financial questions or concerns.

## 2022-07-15 ENCOUNTER — Telehealth: Payer: Self-pay | Admitting: Nurse Practitioner

## 2022-07-15 NOTE — Telephone Encounter (Signed)
Called per 12/12 staff message to r/s 12/20 appointment for later time due to meeting restrictions. Left voicemail with new appointment information.

## 2022-07-20 ENCOUNTER — Ambulatory Visit: Payer: Self-pay | Admitting: Surgery

## 2022-07-20 ENCOUNTER — Encounter (HOSPITAL_COMMUNITY): Payer: Self-pay | Admitting: Surgery

## 2022-07-20 ENCOUNTER — Other Ambulatory Visit: Payer: Self-pay

## 2022-07-20 NOTE — Progress Notes (Signed)
Spoke with pt for pre-op call. Pt denies cardiac history, HTN or Diabetes.   Shower instructions given to pt and she voiced understanding.  

## 2022-07-21 ENCOUNTER — Ambulatory Visit (HOSPITAL_BASED_OUTPATIENT_CLINIC_OR_DEPARTMENT_OTHER): Payer: Medicaid Other | Admitting: Anesthesiology

## 2022-07-21 ENCOUNTER — Encounter (HOSPITAL_COMMUNITY): Payer: Self-pay | Admitting: Surgery

## 2022-07-21 ENCOUNTER — Ambulatory Visit (HOSPITAL_COMMUNITY)
Admission: RE | Admit: 2022-07-21 | Discharge: 2022-07-21 | Disposition: A | Discharge status: Home / Self Care | Payer: Medicaid Other | Attending: Surgery | Admitting: Surgery

## 2022-07-21 ENCOUNTER — Other Ambulatory Visit (HOSPITAL_COMMUNITY): Payer: Self-pay

## 2022-07-21 ENCOUNTER — Ambulatory Visit (HOSPITAL_COMMUNITY): Payer: Medicaid Other | Admitting: Anesthesiology

## 2022-07-21 ENCOUNTER — Encounter: Payer: Self-pay | Admitting: Hematology

## 2022-07-21 ENCOUNTER — Other Ambulatory Visit: Payer: Self-pay

## 2022-07-21 ENCOUNTER — Encounter (HOSPITAL_COMMUNITY)
Admission: RE | Disposition: A | Discharge status: Home / Self Care | Payer: Self-pay | Source: Home / Self Care | Attending: Surgery

## 2022-07-21 DIAGNOSIS — Z87891 Personal history of nicotine dependence: Secondary | ICD-10-CM

## 2022-07-21 DIAGNOSIS — F319 Bipolar disorder, unspecified: Secondary | ICD-10-CM | POA: Insufficient documentation

## 2022-07-21 DIAGNOSIS — M069 Rheumatoid arthritis, unspecified: Secondary | ICD-10-CM | POA: Insufficient documentation

## 2022-07-21 DIAGNOSIS — Z923 Personal history of irradiation: Secondary | ICD-10-CM | POA: Diagnosis not present

## 2022-07-21 DIAGNOSIS — E669 Obesity, unspecified: Secondary | ICD-10-CM | POA: Insufficient documentation

## 2022-07-21 DIAGNOSIS — F1290 Cannabis use, unspecified, uncomplicated: Secondary | ICD-10-CM | POA: Diagnosis not present

## 2022-07-21 DIAGNOSIS — K602 Anal fissure, unspecified: Secondary | ICD-10-CM | POA: Diagnosis not present

## 2022-07-21 DIAGNOSIS — Z224 Carrier of infections with a predominantly sexual mode of transmission: Secondary | ICD-10-CM | POA: Diagnosis not present

## 2022-07-21 DIAGNOSIS — Z9221 Personal history of antineoplastic chemotherapy: Secondary | ICD-10-CM | POA: Insufficient documentation

## 2022-07-21 DIAGNOSIS — Z6834 Body mass index (BMI) 34.0-34.9, adult: Secondary | ICD-10-CM | POA: Diagnosis not present

## 2022-07-21 DIAGNOSIS — Z85048 Personal history of other malignant neoplasm of rectum, rectosigmoid junction, and anus: Secondary | ICD-10-CM | POA: Diagnosis not present

## 2022-07-21 DIAGNOSIS — K6289 Other specified diseases of anus and rectum: Secondary | ICD-10-CM | POA: Diagnosis present

## 2022-07-21 DIAGNOSIS — C210 Malignant neoplasm of anus, unspecified: Secondary | ICD-10-CM | POA: Diagnosis not present

## 2022-07-21 HISTORY — DX: Anemia, unspecified: D64.9

## 2022-07-21 HISTORY — DX: Malignant (primary) neoplasm, unspecified: C80.1

## 2022-07-21 HISTORY — PX: TRANSANAL HEMORRHOIDAL DEARTERIALIZATION: SHX6136

## 2022-07-21 HISTORY — PX: RECTAL EXAM UNDER ANESTHESIA: SHX6399

## 2022-07-21 LAB — POCT PREGNANCY, URINE: Preg Test, Ur: NEGATIVE

## 2022-07-21 SURGERY — EXAM UNDER ANESTHESIA, RECTUM
Anesthesia: General | Site: Rectum

## 2022-07-21 MED ORDER — ONDANSETRON HCL 4 MG/2ML IJ SOLN
INTRAMUSCULAR | Status: DC | PRN
  Administered 2022-07-21: 4 mg via INTRAVENOUS

## 2022-07-21 MED ORDER — BUPIVACAINE LIPOSOME 1.3 % IJ SUSP: 20.0000 mL | Freq: Once | INTRAMUSCULAR | Status: DC

## 2022-07-21 MED ORDER — MIDAZOLAM HCL 2 MG/2ML IJ SOLN
INTRAMUSCULAR | Status: DC | PRN
  Administered 2022-07-21: 2 mg via INTRAVENOUS

## 2022-07-21 MED ORDER — CHLORHEXIDINE GLUCONATE 0.12 % MT SOLN
15.0000 mL | Freq: Once | OROMUCOSAL | Status: AC
  Administered 2022-07-21: 15 mL via OROMUCOSAL
  Filled 2022-07-21: qty 15

## 2022-07-21 MED ORDER — LIDOCAINE 2% (20 MG/ML) 5 ML SYRINGE
INTRAMUSCULAR | Status: DC | PRN
  Administered 2022-07-21: 60 mg via INTRAVENOUS

## 2022-07-21 MED ORDER — ONDANSETRON HCL 4 MG/2ML IJ SOLN
INTRAMUSCULAR | Status: AC
  Filled 2022-07-21: qty 2

## 2022-07-21 MED ORDER — HYDROMORPHONE HCL 1 MG/ML IJ SOLN: 0.2500 mg | INTRAMUSCULAR | Status: DC | PRN

## 2022-07-21 MED ORDER — PHENYLEPHRINE 80 MCG/ML (10ML) SYRINGE FOR IV PUSH (FOR BLOOD PRESSURE SUPPORT)
PREFILLED_SYRINGE | INTRAVENOUS | Status: DC | PRN
  Administered 2022-07-21 (×2): 80 ug via INTRAVENOUS

## 2022-07-21 MED ORDER — LACTATED RINGERS IV SOLN: INTRAVENOUS | Status: DC

## 2022-07-21 MED ORDER — OXYCODONE HCL 5 MG/5ML PO SOLN
5.0000 mg | Freq: Once | ORAL | Status: AC | PRN
  Administered 2022-07-21: 5 mg via ORAL

## 2022-07-21 MED ORDER — CEFAZOLIN SODIUM-DEXTROSE 2-4 GM/100ML-% IV SOLN
2.0000 g | INTRAVENOUS | Status: AC
  Administered 2022-07-21: 2 g via INTRAVENOUS
  Filled 2022-07-21: qty 100

## 2022-07-21 MED ORDER — SUGAMMADEX SODIUM 200 MG/2ML IV SOLN
INTRAVENOUS | Status: DC | PRN
  Administered 2022-07-21: 200 mg via INTRAVENOUS
  Administered 2022-07-21: 100 mg via INTRAVENOUS

## 2022-07-21 MED ORDER — OXYCODONE HCL 5 MG/5ML PO SOLN
ORAL | Status: AC
  Filled 2022-07-21: qty 5

## 2022-07-21 MED ORDER — OXYCODONE HCL 5 MG PO TABS: 5.0000 mg | ORAL_TABLET | Freq: Once | ORAL | Status: AC | PRN

## 2022-07-21 MED ORDER — FENTANYL CITRATE (PF) 250 MCG/5ML IJ SOLN
INTRAMUSCULAR | Status: AC
  Filled 2022-07-21: qty 5

## 2022-07-21 MED ORDER — BUPIVACAINE LIPOSOME 1.3 % IJ SUSP
INTRAMUSCULAR | Status: AC
  Filled 2022-07-21: qty 20

## 2022-07-21 MED ORDER — DEXAMETHASONE SODIUM PHOSPHATE 10 MG/ML IJ SOLN
INTRAMUSCULAR | Status: DC | PRN
  Administered 2022-07-21: 10 mg via INTRAVENOUS

## 2022-07-21 MED ORDER — ACETAMINOPHEN 500 MG PO TABS
1000.0000 mg | ORAL_TABLET | ORAL | Status: AC
  Administered 2022-07-21: 1000 mg via ORAL
  Filled 2022-07-21: qty 2

## 2022-07-21 MED ORDER — MIDAZOLAM HCL 2 MG/2ML IJ SOLN
INTRAMUSCULAR | Status: AC
  Filled 2022-07-21: qty 2

## 2022-07-21 MED ORDER — SODIUM CHLORIDE (PF) 0.9 % IJ SOLN
100.0000 [IU] | Freq: Once | INTRAMUSCULAR | Status: DC
  Filled 2022-07-21: qty 100

## 2022-07-21 MED ORDER — BUPIVACAINE LIPOSOME 1.3 % IJ SUSP
INTRAMUSCULAR | Status: DC | PRN
  Administered 2022-07-21: 20 mL

## 2022-07-21 MED ORDER — PROPOFOL 10 MG/ML IV BOLUS
INTRAVENOUS | Status: DC | PRN
  Administered 2022-07-21: 130 mg via INTRAVENOUS

## 2022-07-21 MED ORDER — ONDANSETRON HCL 4 MG/2ML IJ SOLN: 4.0000 mg | Freq: Once | INTRAMUSCULAR | Status: DC | PRN

## 2022-07-21 MED ORDER — AMISULPRIDE (ANTIEMETIC) 5 MG/2ML IV SOLN
INTRAVENOUS | Status: AC
  Filled 2022-07-21: qty 4

## 2022-07-21 MED ORDER — SODIUM CHLORIDE 0.9% FLUSH
INTRAVENOUS | Status: DC | PRN
  Administered 2022-07-21: 10 mL

## 2022-07-21 MED ORDER — 0.9 % SODIUM CHLORIDE (POUR BTL) OPTIME
TOPICAL | Status: DC | PRN
  Administered 2022-07-21: 1000 mL

## 2022-07-21 MED ORDER — ORAL CARE MOUTH RINSE: 15.0000 mL | Freq: Once | OROMUCOSAL | Status: AC

## 2022-07-21 MED ORDER — FENTANYL CITRATE (PF) 250 MCG/5ML IJ SOLN
INTRAMUSCULAR | Status: DC | PRN
  Administered 2022-07-21: 100 ug via INTRAVENOUS
  Administered 2022-07-21: 50 ug via INTRAVENOUS

## 2022-07-21 MED ORDER — AMISULPRIDE (ANTIEMETIC) 5 MG/2ML IV SOLN
10.0000 mg | Freq: Once | INTRAVENOUS | Status: AC | PRN
  Administered 2022-07-21: 10 mg via INTRAVENOUS

## 2022-07-21 MED ORDER — ROCURONIUM BROMIDE 10 MG/ML (PF) SYRINGE
PREFILLED_SYRINGE | INTRAVENOUS | Status: DC | PRN
  Administered 2022-07-21: 40 mg via INTRAVENOUS

## 2022-07-21 MED ORDER — HEMOSTATIC AGENTS (NO CHARGE) OPTIME
TOPICAL | Status: DC | PRN
  Administered 2022-07-21: 1

## 2022-07-21 MED ORDER — TRAMADOL HCL 50 MG PO TABS
50.0000 mg | ORAL_TABLET | Freq: Four times a day (QID) | ORAL | 0 refills | Status: DC | PRN
  Filled 2022-07-21: qty 10, 3d supply, fill #0

## 2022-07-21 MED ORDER — DEXAMETHASONE SODIUM PHOSPHATE 10 MG/ML IJ SOLN
INTRAMUSCULAR | Status: AC
  Filled 2022-07-21: qty 1

## 2022-07-21 SURGICAL SUPPLY — 35 items
BAG COUNTER SPONGE SURGICOUNT (BAG) ×2 IMPLANT
BAG SPNG CNTER NS LX DISP (BAG) ×2
BLADE SURG 15 STRL LF DISP TIS (BLADE) ×2 IMPLANT
BLADE SURG 15 STRL SS (BLADE) ×2
BRIEF STRETCH FOR OB PAD LRG (UNDERPADS AND DIAPERS) ×2 IMPLANT
CANISTER SUCT 3000ML PPV (MISCELLANEOUS) ×2 IMPLANT
COVER SURGICAL LIGHT HANDLE (MISCELLANEOUS) ×2 IMPLANT
DRAPE LAPAROTOMY 100X72 PEDS (DRAPES) IMPLANT
ELECT CAUTERY BLADE 6.4 (BLADE) ×2 IMPLANT
GAUZE PAD ABD 8X10 STRL (GAUZE/BANDAGES/DRESSINGS) ×2 IMPLANT
GAUZE SPONGE 4X4 12PLY STRL (GAUZE/BANDAGES/DRESSINGS) ×2 IMPLANT
GLOVE BIO SURGEON STRL SZ7.5 (GLOVE) ×2 IMPLANT
GLOVE INDICATOR 8.0 STRL GRN (GLOVE) ×2 IMPLANT
GOWN STRL REUS W/ TWL LRG LVL3 (GOWN DISPOSABLE) ×2 IMPLANT
GOWN STRL REUS W/ TWL XL LVL3 (GOWN DISPOSABLE) ×2 IMPLANT
GOWN STRL REUS W/TWL LRG LVL3 (GOWN DISPOSABLE) ×2
GOWN STRL REUS W/TWL XL LVL3 (GOWN DISPOSABLE) ×2
KIT BASIN OR (CUSTOM PROCEDURE TRAY) ×2 IMPLANT
KIT TURNOVER KIT B (KITS) ×2 IMPLANT
NDL HYPO 25GX1X1/2 BEV (NEEDLE) ×2 IMPLANT
NEEDLE HYPO 25GX1X1/2 BEV (NEEDLE) ×2 IMPLANT
NS IRRIG 1000ML POUR BTL (IV SOLUTION) ×2 IMPLANT
PACK BASIC III (CUSTOM PROCEDURE TRAY) ×2
PACK LITHOTOMY IV (CUSTOM PROCEDURE TRAY) ×2 IMPLANT
PACK SRG BSC III STRL LF ECLPS (CUSTOM PROCEDURE TRAY) IMPLANT
PAD ARMBOARD 7.5X6 YLW CONV (MISCELLANEOUS) ×2 IMPLANT
PENCIL BUTTON HOLSTER BLD 10FT (ELECTRODE) ×2 IMPLANT
SPONGE HEMORRHOID 8X3CM (HEMOSTASIS) IMPLANT
SPONGE T-LAP 4X18 ~~LOC~~+RFID (SPONGE) ×2 IMPLANT
SURGILUBE 2OZ TUBE FLIPTOP (MISCELLANEOUS) ×2 IMPLANT
SUT CHROMIC 3 0 SH 27 (SUTURE) ×2 IMPLANT
SYR CONTROL 10ML LL (SYRINGE) ×2 IMPLANT
TOWEL GREEN STERILE (TOWEL DISPOSABLE) ×2 IMPLANT
TUBE CONNECTING 12X1/4 (SUCTIONS) ×2 IMPLANT
YANKAUER SUCT BULB TIP NO VENT (SUCTIONS) ×2 IMPLANT

## 2022-07-21 NOTE — Op Note (Signed)
07/21/2022  10:27 AM  PATIENT:  Tammy Garrison  31 y.o. female  Patient Care Team: Pa, [place] as PCP - General (Internal Medicine) [name], MD (Rheumatology) Tyson Dense, MD as Consulting Physician (Obstetrics and Gynecology) Truitt Merle, MD as Consulting Physician (Hematology and Oncology)  PRE-OPERATIVE DIAGNOSIS:  History of anal cancer; anal pain  POST-OPERATIVE DIAGNOSIS:  History of anal cancer, anal fissure  PROCEDURE:   Anorectal exam under anesthesia with biopsy of anal canal under anoscopic guidance  SURGEON:  Surgeon(s): Ileana Roup, MD  ASSISTANT: OR staff   ANESTHESIA:   local and general  SPECIMEN:  Anterior midline anal canal tissue  DISPOSITION OF SPECIMEN:  PATHOLOGY  COUNTS:  Sponge, needle, and instrument counts were reported correct x2 at conclusion.  EBL: 5 mL  Drains: None  PLAN OF CARE: Discharge to home after PACU  PATIENT DISPOSITION:  PACU - hemodynamically stable.  OR FINDINGS: Mild intrinsic anal canal stenosis which easily tolerated digital examination.  No palpable masses.  Circumferential anoscopy demonstrates anterior midline anal fissure which is shallow without sphincter muscle exposed and additionally a shallow/healed posterior midline anal fissure.  There are no masses within the anal canal or distalmost aspect of the rectum.  Anterior midline anal canal tissue was biopsied for confirmation purposes however.  DESCRIPTION: The patient was identified in the preoperative holding area and taken to the OR. SCDs were applied. She then underwent general endotracheal anesthesia without difficulty. The patient was then rolled onto the OR table in the prone jackknife position. Pressure points were then evaluated and padded. Benzoin was applied to the buttocks and they were gently taped apart.  She was then prepped and draped in usual sterile fashion.  A surgical timeout was performed indicating the correct  patient, procedure, and positioning.  A perianal block was then created using a dilute mixture of Exparel - 266 mg/20 cc reconstituted with 10 cc of injectable saline  After ascertaining an appropriate level of anesthesia had been achieved, a well lubricated digital rectal exam was performed. This demonstrated mild intrinsic anal stenosis but is gently dilated to 2 fingerbreadths to facilitate anoscopic evaluation. No palpable masses.  A Hill-Ferguson anoscope was into the anal canal and circumferential inspection demonstrated shallow anterior midline anal fissure without any exposed sphincter muscle.  He would recommend also short/shallow tract posterior midline fissure.  There are no visible masses or ulcerations.  Both the left and right lateral anal canal are also tediously evaluated given her history of the lesion laterally.  There is no evidence of ulceration or even evidence of a prior lesion.  The only area with any sort of ulceration/open wound is where she has very to midline fissure.  The anoscope in place, we are able to obtain a biopsy of this including some normal surrounding anoderm with that.  Hemostasis was then achieved electrocautery and the specimen was passed off.  The anal canal circumferentially inspected and hemostasis is verified.  A piece of Surgifoam was inserted into the anal canal.  All sponge, needle, and instrument counts were reported correct at conclusion.  The buttocks were taped.  A dressing consisting of 4 x 4's, ABD, and mesh underwear was placed and she is rolled back onto the stretcher.  She was then awakened from anesthesia, extubated, transferred to the recovery room in satisfactory condition.  DISPOSITION: PACU in satisfactory condition.

## 2022-07-21 NOTE — Transfer of Care (Signed)
Immediate Anesthesia Transfer of Care Note  Patient: Tammy Garrison  Procedure(s) Performed: RECTAL EXAM UNDER ANESTHESIA ANAL CANAL BIOPSY (Rectum)  Patient Location: PACU  Anesthesia Type:General  Level of Consciousness: awake, alert , and drowsy  Airway & Oxygen Therapy: Patient Spontanous Breathing  Post-op Assessment: Report given to RN, Post -op Vital signs reviewed and stable, and Patient moving all extremities X 4  Post vital signs: Reviewed and stable  Last Vitals:  Vitals Value Taken Time  BP 125/85 07/21/22 1036  Temp    Pulse 107 07/21/22 1038  Resp 21 07/21/22 1038  SpO2 98 % 07/21/22 1038  Vitals shown include unvalidated device data.  Last Pain:  Vitals:   07/21/22 0804  TempSrc:   PainSc: 6          Complications: No notable events documented.

## 2022-07-21 NOTE — Anesthesia Postprocedure Evaluation (Signed)
Anesthesia Post Note  Patient: Tammy Garrison  Procedure(s) Performed: RECTAL EXAM UNDER ANESTHESIA ANAL CANAL BIOPSY (Rectum)     Patient location during evaluation: PACU Anesthesia Type: General Level of consciousness: awake and alert and oriented Pain management: pain level controlled Vital Signs Assessment: post-procedure vital signs reviewed and stable Respiratory status: spontaneous breathing, nonlabored ventilation and respiratory function stable Cardiovascular status: blood pressure returned to baseline and stable Postop Assessment: no apparent nausea or vomiting Anesthetic complications: no   No notable events documented.  Last Vitals:  Vitals:   07/21/22 1100 07/21/22 1115  BP: (!) 135/96 (!) 133/94  Pulse: 99 91  Resp: 19 14  Temp:    SpO2: 98% 99%    Last Pain:  Vitals:   07/21/22 1115  TempSrc:   PainSc: 4                  [name]

## 2022-07-21 NOTE — H&P (Signed)
CC: Here today for surgery  HPI: Tammy Garrison is an 31 y.o. female taken to OR by Dr. Grandville Silos 02/08/22 - excision of anal mass , internal hemorrhoidectomy, EUA.  +HPV  PATH:  A. SOFT TISSUE, ANTERIOR MASS VS. HEMORRHOID, EXCISION:  Invasive squamous cell carcinoma, focally keratinizing and  well-differentiated.  Carcinoma is present at the margins of excision.  Results of p16 immunostain will be reported as an addendum   B. HEMORRHOID, INTERNAL, EXCISION:  Hemorrhoid without the overlying epithelium.  Separate fragment of benign squamous epithelium.  Negative for neoplasm.   No prior colonoscopy  CT PET 02/26/22: Intense FDG uptake localizing to anus. No signs of solid organ metastasis or FDG avid nodal metastases. Mild nonspecific tracer uptake in inguinal lymph nodes bilaterally. Felt to be potentially reactive given recent surgery Enlarged and FDG avid left inguinal lymph nodes, etiology indeterminate. Felt to be reactive versus metastatic. Subcutaneous nodule in the ventral chest wall just to the left of midline and anterior to the body of the sternum is FDG avid. Felt to be infectious or inflammatory cannot rule out malignancy. Enlarged FDG avid axillary lymph node.  Seeing Dr. Burr Medico and [place] with med/onc and rad/onc.   [id number]  Scheduled to undergo 'modified Nigro protocol' with chemoradiation. She actually reports improvement in her anal pain since having had surgery. Currently just has some soreness with bowel movements. Occasional bleeding. She is here today with her mother.  She was referred by Dr. Grandville Silos to see me for specialty consultation given anal cancer.  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 Returns to see me for follow-up. Overall, feels much better relative to prior to treatment. No rectal bleeding anymore. She has regular bowel movements. She does take daily MiraLAX to keep her stool soft and avoid  constipation.  Following with med onc Dr. Burr Medico as well. Has PET scan scheduled for later today. Reports that she is struggled with some degree of anal pain on the order of weeks to months now. She does not note any sort of mass or "hemorrhoid" when wiping. Her only symptom is this anal pain that she has been experiencing.  We have recommended anal exam today in the office but she has refused this. We work to educate her on the importance of [place] evaluating this and she reports that she would not like this to be seen unless she is under anesthesia.  She denies any changes in her health or health history since we met in the office.  PET scan 07/08/22 1. There is persistent increased radiotracer uptake associated with the anus. The degree of uptake is similar to the previous exam. Cannot exclude residual FDG avid tumor. 2. No signs of solid organ or nodal metastasis within the chest, abdomen or pelvis. 3. Interval resolution of previous FDG avid left axillary lymph nodes. 4. Interval increase in radiotracer uptake associated with anterior mediastinal soft tissue. The configuration of the soft tissue is consistent with the thymus gland. 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 5. Interval development of multiple FDG avid cutaneous soft tissue nodules within the anterior left shoulder, ventral chest wall, and anteromedial left thigh. These are nonspecific and may be inflammatory or infectious in etiology. Metastatic disease is considered less favored. Correlation with physical exam findings is advised. 6. Enlarged uterus containing degenerating fibroids.  Pelvic MRI 07/12/22 1. No dominant anal mass identified. The patient refused rectal contrast, and  therefore the anus is underdistended. 2. No pelvic adenopathy. 3. Fibroid uterus.  She did note a remote history of depression and suicidal ideation remotely as noted in  her chart previously - 05/2016 where she took ibuprofen. She specifically denies any current or recent suicidal ideations or attempts. States from a mental health standpoint she feels she is in a good place and does not wish to discuss further or speak with anyone about anything.   PMH: HPV  PSH: As above  FHx: Denies any known family history of colorectal, breast, endometrial or ovarian cancer  Social Hx: Denies use of tobacco/EtOH/illicit drug. 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   Past Medical History:  Diagnosis Date   Anemia    Bipolar 1 disorder ([place])    Cancer ([place])    squamous cell rectal cancer   ETOH abuse    H/O self-harm    H/O suicide attempt    x 5 - last 05/2016 - overdose Ibuprofen   Marijuana abuse    Rheumatoid arthritis ([place])     Past Surgical History:  Procedure Laterality Date   HEMORRHOID SURGERY N/A 02/08/2022   Procedure: EXTERNAL AND INTERNAL HEMORRHOIDECTOMY;  Surgeon: Georganna Skeans, MD;  Location: [facility];  Service: General;  Laterality: N/A;   INCISION AND DRAINAGE ABSCESS Right 04/22/2022   Procedure: INCISION AND DRAINAGE axilla;  Surgeon: Kinsinger, Arta Bruce, MD;  Location: [facility];  Service: General;  Laterality: Right;    Family History  Problem Relation Age of Onset   Hypertension Other    Lupus Mother     Social:  reports that she quit smoking about 3 years ago. Her smoking use included cigarettes. She has a 1.75 pack-year smoking history. She has never used smokeless tobacco. She reports that she does not currently use alcohol. She reports that she does not currently use drugs after having used the following drugs: Marijuana.  Allergies:  Allergies  Allergen Reactions   Coconut Flavor Itching and Rash    Itchy throat    Medications: I have reviewed the patient's current medications.  No results found for this or any previous visit (from the past 48 hour(s)).  No  results found.  ROS - all of the below systems have been reviewed with the patient and positives are indicated with bold text General: chills, fever or night sweats Eyes: blurry vision or double vision ENT: epistaxis or sore throat Allergy/Immunology: itchy/watery eyes or nasal congestion Hematologic/Lymphatic: bleeding problems, blood clots or swollen lymph nodes Endocrine: temperature intolerance or unexpected weight changes Breast: new or changing breast lumps or nipple discharge Resp: cough, shortness of breath, or wheezing CV: chest pain or dyspnea on exertion GI: as per HPI GU: dysuria, trouble voiding, or hematuria MSK: joint pain or joint stiffness Neuro: TIA or stroke symptoms Derm: pruritus and skin lesion changes Psych: anxiety and depression  PE Last menstrual period 05/15/2022. Constitutional: NAD; conversant Eyes: Moist conjunctiva Lungs: Normal respiratory effort CV: RR Psychiatric: Appropriate affect  No results found for this or any previous visit (from the past 90 hour(s)).  No results found.  A/P: Tarrin Rula [place] is an 31 y.o. female with hx HPV anal squamous cell carcinoma  PET CT 02/26/22 - known primary in anus with FDG avidity No solid organ metastasis Reactive vs metastatic inguinal nodes on PET Subcutaneous nodule chest wall  Completed cXRT with Dr. Veva Holes 04/20/2022  -Referral to [facility] (no prior colonoscopy) -  still has not had colonoscopy yet  -We spent time reviewing her diagnosis, general treatment recommendations and long-term outlook/plans moving forward. -She has significant anal pain and has refused examination in the office today. PET 07/09/22   -The anatomy and physiology of the anal canal was discussed with the patient again today. The pathophysiology of anal pain and anal cancer has been reviewed -We have discussed anorectal exam under anesthesia, possible biopsy if any abnormal findings, possible perianal Botox if  chronic fissure type appearance and no overt concerns for anal cancer remain -The planned procedures, material risks (including, but not limited to, pain, bleeding, infection, scarring, need for blood transfusion, damage to anal sphincter, incontinence of gas and/or stool, need for additional procedures, anal stenosis, rare cases of pelvic sepsis which in severe cases may require things like a colostomy, recurrence, pneumonia, heart attack, stroke, death) benefits and alternatives to surgery were discussed at length. I noted a good probability that the procedure would help improve their symptoms. The patient's questions were answered to her satisfaction, she voiced understanding and elected to proceed with surgery. Additionally, we discussed typical postoperative expectations and the recovery process.   Nadeen Landau, [facility], [place]

## 2022-07-21 NOTE — Anesthesia Procedure Notes (Signed)
Procedure Name: Intubation Date/Time: 07/21/2022 9:46 AM  Performed by: Harden Mo, CRNAPre-anesthesia Checklist: Patient identified, Emergency Drugs available, Suction available and Patient being monitored Patient Re-evaluated:Patient Re-evaluated prior to induction Oxygen Delivery Method: Circle System Utilized Preoxygenation: Pre-oxygenation with 100% oxygen Induction Type: IV induction Ventilation: Mask ventilation without difficulty and Oral airway inserted - appropriate to patient size Laryngoscope Size: Sabra Heck and 2 Grade View: Grade I Tube type: Oral Tube size: 7.0 mm Number of attempts: 1 Airway Equipment and Method: Stylet and Oral airway Placement Confirmation: ETT inserted through vocal cords under direct vision, positive ETCO2 and breath sounds checked- equal and bilateral Secured at: 22 cm Tube secured with: Tape Dental Injury: Teeth and Oropharynx as per pre-operative assessment

## 2022-07-21 NOTE — Anesthesia Preprocedure Evaluation (Signed)
Anesthesia Evaluation  Patient identified by MRN, date of birth, ID band Patient awake    Reviewed: Allergy & Precautions, NPO status , Patient's Chart, lab work & pertinent test results  Airway Mallampati: III  TM Distance: >3 FB Neck ROM: Full    Dental  (+) Teeth Intact, Dental Advisory Given   Pulmonary Patient abstained from smoking., former smoker   Pulmonary exam normal breath sounds clear to auscultation       Cardiovascular negative cardio ROS  Rhythm:Regular Rate:Tachycardia     Neuro/Psych  PSYCHIATRIC DISORDERS  Depression Bipolar Disorder   negative neurological ROS     GI/Hepatic ,,,(+)     substance abuse  alcohol use and marijuana use squamous cell anal cancer on active chemoradiation   Endo/Other  Obesity   Renal/GU negative Renal ROS     Musculoskeletal  (+) Arthritis , Rheumatoid disorders,    Abdominal   Peds  Hematology  (+) Blood dyscrasia (Thrombocytopenia), anemia   Anesthesia Other Findings Day of surgery medications reviewed with the patient.  Reproductive/Obstetrics                              Anesthesia Physical Anesthesia Plan  ASA: 3  Anesthesia Plan: General   Post-op Pain Management: Ofirmev IV (intra-op)*   Induction: Intravenous  PONV Risk Score and Plan: 3 and Midazolam, Dexamethasone and Ondansetron  Airway Management Planned: LMA  Additional Equipment:   Intra-op Plan:   Post-operative Plan: Extubation in OR  Informed Consent: I have reviewed the patients History and Physical, chart, labs and discussed the procedure including the risks, benefits and alternatives for the proposed anesthesia with the patient or authorized representative who has indicated his/her understanding and acceptance.     Dental advisory given  Plan Discussed with: CRNA  Anesthesia Plan Comments:          Anesthesia Quick Evaluation

## 2022-07-22 ENCOUNTER — Other Ambulatory Visit (HOSPITAL_COMMUNITY): Payer: Self-pay

## 2022-07-22 ENCOUNTER — Encounter: Payer: Self-pay | Admitting: Hematology

## 2022-07-22 ENCOUNTER — Inpatient Hospital Stay (HOSPITAL_BASED_OUTPATIENT_CLINIC_OR_DEPARTMENT_OTHER): Payer: Medicaid Other | Admitting: Nurse Practitioner

## 2022-07-22 ENCOUNTER — Encounter (HOSPITAL_COMMUNITY): Payer: Self-pay | Admitting: Surgery

## 2022-07-22 VITALS — BP 136/95 | HR 95 | Temp 98.3°F | Resp 14 | Wt 260.2 lb

## 2022-07-22 DIAGNOSIS — Z515 Encounter for palliative care: Secondary | ICD-10-CM

## 2022-07-22 DIAGNOSIS — R630 Anorexia: Secondary | ICD-10-CM

## 2022-07-22 DIAGNOSIS — M792 Neuralgia and neuritis, unspecified: Secondary | ICD-10-CM

## 2022-07-22 DIAGNOSIS — C210 Malignant neoplasm of anus, unspecified: Secondary | ICD-10-CM

## 2022-07-22 DIAGNOSIS — R634 Abnormal weight loss: Secondary | ICD-10-CM

## 2022-07-22 DIAGNOSIS — G893 Neoplasm related pain (acute) (chronic): Secondary | ICD-10-CM

## 2022-07-22 DIAGNOSIS — R110 Nausea: Secondary | ICD-10-CM | POA: Diagnosis not present

## 2022-07-22 DIAGNOSIS — G4700 Insomnia, unspecified: Secondary | ICD-10-CM

## 2022-07-22 DIAGNOSIS — F32A Depression, unspecified: Secondary | ICD-10-CM

## 2022-07-22 DIAGNOSIS — R530 Neoplastic (malignant) related fatigue: Secondary | ICD-10-CM

## 2022-07-22 LAB — SURGICAL PATHOLOGY

## 2022-07-22 MED ORDER — TRAMADOL HCL 50 MG PO TABS
50.0000 mg | ORAL_TABLET | Freq: Four times a day (QID) | ORAL | 0 refills | Status: DC | PRN
  Filled 2022-07-22: qty 60, 15d supply, fill #0

## 2022-07-22 MED ORDER — PROCHLORPERAZINE MALEATE 10 MG PO TABS
10.0000 mg | ORAL_TABLET | Freq: Four times a day (QID) | ORAL | 1 refills | Status: DC | PRN
  Filled 2022-07-22: qty 30, 8d supply, fill #0

## 2022-07-22 MED ORDER — MIRTAZAPINE 7.5 MG PO TABS
7.5000 mg | ORAL_TABLET | Freq: Every day | ORAL | 0 refills | Status: DC
  Filled 2022-07-22 – 2022-07-31 (×2): qty 15, 15d supply, fill #0

## 2022-07-22 MED ORDER — GABAPENTIN 300 MG PO CAPS: 300.0000 mg | ORAL_CAPSULE | Freq: Three times a day (TID) | ORAL | 0 refills | Status: DC

## 2022-07-22 NOTE — Patient Instructions (Addendum)
-   pick up and take compazine every 6 hours as needed for nausea and vomiting  - continue to take gabapentin, increase your dose to 1 pill 3 times a day (morning, midday, night) - continue to take Mirilax 2 times a day, back down to 1 time if you are having multiple loose stools a day - continue with tramadol as needed for pain every 6 hours - continue to take sleep aids as needed IF it is helping you sleep - pick up and take Remeron at night to help with sleep, appetite, and mood - we will check for lidocaine jelly for you to pick up - call or MyChart message [place] 2-3 days before a needed refill - if you have any new shortness of breath, chest pain, or trouble breathing, call 911 or seek emergency services

## 2022-07-22 NOTE — Progress Notes (Unsigned)
[facility]  [phone]) [phone] Fax:(336) [phone]   Name: Tenise Stetler Date: 07/22/2022 MRN: [id number]  DOB: 08/29/1990  Patient Care Team: [facility] as PCP - General (Internal Medicine) [name], MD (Rheumatology) Tyson Dense, MD as Consulting Physician (Obstetrics and Gynecology) Truitt Merle, MD as Consulting Physician (Hematology and Oncology)    REASON FOR CONSULTATION: Alyssia Michaelina Blandino is a 31 y.o. 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 Palliative ask to see for symptom management.   SOCIAL HISTORY:     reports that she quit smoking about 3 years ago. Her smoking use included cigarettes. She has a 1.75 pack-year smoking history. She has never used smokeless tobacco. She reports that she does not currently use alcohol. She reports that she does not currently use drugs after having used the following drugs: Marijuana.  ADVANCE DIRECTIVES:    CODE STATUS:   PAST MEDICAL HISTORY: Past Medical History:  Diagnosis Date   Anemia    Bipolar 1 disorder ([place])    Cancer ([place])    squamous cell rectal cancer   ETOH abuse    H/O self-harm    H/O suicide attempt    x 5 - last 05/2016 - overdose Ibuprofen   Marijuana abuse    Rheumatoid arthritis ([place])     PAST SURGICAL HISTORY:  Past Surgical History:  Procedure Laterality Date   HEMORRHOID SURGERY N/A 02/08/2022   Procedure: EXTERNAL AND INTERNAL HEMORRHOIDECTOMY;  Surgeon: Georganna Skeans, MD;  Location: [facility];  Service: General;  Laterality: N/A;   INCISION AND DRAINAGE ABSCESS Right 04/22/2022   Procedure: INCISION AND DRAINAGE axilla;  Surgeon: Kieth Brightly, Arta Bruce, MD;  Location: [facility];  Service: General;  Laterality: Right;   RECTAL EXAM UNDER ANESTHESIA N/A 07/21/2022   Procedure: RECTAL EXAM UNDER ANESTHESIA;  Surgeon: Ileana Roup, MD;  Location: [facility];   Service: General;  Laterality: N/A;   TRANSANAL HEMORRHOIDAL DEARTERIALIZATION N/A 07/21/2022   Procedure: ANAL CANAL BIOPSY;  Surgeon: Ileana Roup, MD;  Location: [facility];  Service: General;  Laterality: N/A;    HEMATOLOGY/ONCOLOGY HISTORY:  Oncology History  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 SOFT TISSUE, ANTERIOR MASS VS. HEMORRHOID, EXCISION:  Invasive squamous cell carcinoma, focally keratinizing and  well-differentiated.  Carcinoma is present at the margins of excision.   B. HEMORRHOID, INTERNAL, EXCISION:  Hemorrhoid without the overlying epithelium.  Separate fragment of benign squamous epithelium.  Negative for neoplasm.   ADDENDUM:  P16 immunostain is strongly positive suggestive of this lesion being the HPV related.    02/08/2022 Cancer Staging   Staging form: Anus, AJCC V9 - Clinical stage from 02/08/2022: Stage IIA (cT2, cN0, cM0) - Signed by Truitt Merle, MD on 02/23/2022 Stage prefix: Initial diagnosis   02/23/2022 Initial Diagnosis   Anal cancer ([place])   03/09/2022 - 03/13/2022 Chemotherapy   Patient is on Treatment Plan : ANUS Mitomycin D1,28 / 5FU D1-4, 28-31 q32d     03/09/2022 -  Chemotherapy   Patient is on Treatment Plan : ANUS Mitomycin D1,28 + 5FU D1-4, 28-31 q32d      PET scan   IMPRESSION: 1. There is persistent increased radiotracer uptake associated with the anus. The degree of uptake is similar to the previous exam. Cannot exclude residual FDG avid tumor. 2. No signs of solid organ or nodal metastasis within the  chest, abdomen or pelvis. 3. Interval resolution of previous FDG avid left axillary lymph nodes. 4. Interval increase in radiotracer uptake associated with anterior mediastinal soft tissue. The configuration of the soft tissue is consistent with the thymus gland. 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 5.  Interval development of multiple FDG avid cutaneous soft tissue nodules within the anterior left shoulder, ventral chest wall, and anteromedial left thigh. These are nonspecific and may be inflammatory or infectious in etiology. Metastatic disease is considered less favored. Correlation with physical exam findings is advised. 6. Enlarged uterus containing degenerating fibroids.     ALLERGIES:  is allergic to coconut flavor.  MEDICATIONS:  Current Outpatient Medications  Medication Sig Dispense Refill   mirtazapine (REMERON) 7.5 MG tablet Take 1 tablet (7.5 mg total) by mouth at bedtime. 15 tablet 0   prochlorperazine (COMPAZINE) 10 MG tablet Take 1 tablet (10 mg total) by mouth every 6 (six) hours as needed for nausea or vomiting. 30 tablet 1   diphenhydrAMINE HCl, Sleep, ([employer] SLEEP-AID NIGHTTIME) 50 MG CAPS Take 100-150 mg by mouth at bedtime.     docusate sodium (COLACE) 100 MG capsule Take 200 mg by mouth daily.     gabapentin (NEURONTIN) 300 MG capsule Take 1 capsule (300 mg total) by mouth 3 (three) times daily. 60 capsule 0   Histamine Dihydrochloride (AUSTRALIAN DREAM ARTHRITIS EX) Apply 1 Application topically 3 (three) times daily as needed (pain.).     ibuprofen (ADVIL) 200 MG tablet Take 800 mg by mouth every 8 (eight) hours as needed (pain.).     lidocaine (REGENECARE HA) 2 % jelly Apply 1 Application topically daily. (Patient not taking: Reported on 07/09/2022) 85 g 0   LORazepam (ATIVAN) 0.5 MG tablet Take 1 tablet (0.5 mg total) by mouth every 6 (six) hours as needed (nausea). (Patient not taking: Reported on 07/09/2022) 20 tablet 0   megestrol (MEGACE) 40 MG tablet Take 1 tablet (40 mg total) by mouth 2 (two) times daily. (Patient not taking: Reported on 05/19/2022) 28 tablet 0   Menthol, Topical Analgesic, (BIOFREEZE EX) Apply 1 Application topically 3 (three) times daily as needed (pain.).     polyethylene glycol (MIRALAX / GLYCOLAX) 17 g packet Take 17 g by mouth daily.      Pseudoeph-Doxylamine-DM-APAP (NYQUIL PO) Take 15-30 mLs by mouth at bedtime as needed (cold symptoms (hold sleep-aid when using)).     traMADol (ULTRAM) 50 MG tablet Take 1 tablet (50 mg total) by mouth every 6 (six) hours as needed (postop pain not controlled with tylenol/ibuprofen first). 60 tablet 0   No current facility-administered medications for this visit.   Facility-Administered Medications Ordered in Other Visits  Medication Dose Route Frequency Provider Last Rate Last Admin   0.9 %  sodium chloride infusion (Manually program via Guardrails IV Fluids)  250 mL Intravenous Once Truitt Merle, MD       heparin lock flush 100 unit/mL  250 Units Intracatheter Once Truitt Merle, MD       sodium chloride flush (NS) 0.9 % injection 10 mL  10 mL Intracatheter Once Truitt Merle, MD       sodium chloride flush (NS) 0.9 % injection 10 mL  10 mL Intracatheter Once Truitt Merle, MD       sodium chloride flush (NS) 0.9 % injection 10 mL  10 mL Intracatheter Once Truitt Merle, MD        VITAL SIGNS: BP Marland Kitchen)  136/95 (BP Location: Right Arm, Patient Position: Sitting)   Pulse 95   Temp 98.3 F (36.8 C) (Oral)   Resp 14   Wt 260 lb 3.2 oz (118 kg)   LMP 05/11/2022 Comment: has hx of fibroids  SpO2 99%   BMI 35.29 kg/m  Filed Weights   07/22/22 1101  Weight: 260 lb 3.2 oz (118 kg)    Estimated body mass index is 35.29 kg/m as calculated from the following:   Height as of 07/21/22: 6' (1.829 m).   Weight as of this encounter: 260 lb 3.2 oz (118 kg).  LABS: CBC:    Component Value Date/Time   WBC 4.3 07/09/2022 1131   HGB 12.5 07/09/2022 1131   HGB 10.8 (L) 03/30/2022 0943   HCT 38.3 07/09/2022 1131   HCT 30.0 (L) 03/20/2019 1613   PLT 277 07/09/2022 1131   PLT 155 03/30/2022 0943   MCV 93.4 07/09/2022 1131   NEUTROABS 3.2 07/09/2022 1131   LYMPHSABS 0.5 (L) 07/09/2022 1131   MONOABS 0.4 07/09/2022 1131   EOSABS 0.2 07/09/2022 1131   BASOSABS 0.0 07/09/2022 1131   Comprehensive Metabolic  Panel:    Component Value Date/Time   NA 141 07/09/2022 1131   K 3.8 07/09/2022 1131   CL 106 07/09/2022 1131   CO2 28 07/09/2022 1131   BUN 10 07/09/2022 1131   CREATININE 0.87 07/09/2022 1131   CREATININE 0.52 03/30/2022 0943   GLUCOSE 109 (H) 07/09/2022 1131   CALCIUM 9.8 07/09/2022 1131   AST 15 07/09/2022 1131   AST 26 03/30/2022 0943   ALT 18 07/09/2022 1131   ALT 42 03/30/2022 0943   ALKPHOS 74 07/09/2022 1131   BILITOT 0.3 07/09/2022 1131   BILITOT 0.6 03/30/2022 0943   PROT 7.4 07/09/2022 1131   ALBUMIN 4.0 07/09/2022 1131    RADIOGRAPHIC STUDIES: MR PELVIS WO CM RECTAL CA STAGING  Result Date: 07/13/2022 CLINICAL DATA:  Anal cancer diagnosed after hemorrhoidectomy 02/08/2022. 4.2 cm squamous cell carcinoma. Status post chemotherapy and radiation therapy. EXAM: MRI PELVIS WITHOUT CONTRAST TECHNIQUE: Multiplanar multisequence MR imaging of the pelvis was performed. No intravenous contrast was administered. COMPARISON:  05/18/2022 abdominopelvic CT.  07/08/2022 PET. FINDINGS: Urinary Tract:  Normal urinary bladder Bowel: The patient refused rectal contrast. This results in significant underdistention. Given this limitation, no dominant anal mass is identified. No masslike restricted diffusion. Vascular/Lymphatic: No pelvic sidewall or perirectal adenopathy. Reproductive: Fibroid uterus, including a dominant left-sided fundal 5.1 x 6.8 by 6.4 cm lesion. No adnexal mass. Other:  Trace free pelvic fluid is likely physiologic. Musculoskeletal: No acute osseous abnormality. Disc bulge at the lumbosacral junction. IMPRESSION: 1. No dominant anal mass identified. The patient refused rectal contrast, and therefore the anus is underdistended. 2. No pelvic adenopathy. 3. Fibroid uterus. Electronically Signed   By: Abigail Miyamoto M.D.   On: 07/13/2022 10:25   NM PET Image Restag (PS) Skull Base To Thigh  Result Date: 07/09/2022 CLINICAL DATA:  Subsequent treatment strategy for anal cancer.  EXAM: NUCLEAR MEDICINE PET SKULL BASE TO THIGH TECHNIQUE: 12.3 mCi F-18 FDG was injected intravenously. Full-ring PET imaging was performed from the skull base to thigh after the radiotracer. CT data was obtained and used for attenuation correction and anatomic localization. Fasting blood glucose: 114 mg/dl COMPARISON:  PET-CT 02/22/2022 FINDINGS: Mediastinal blood pool activity: SUV max 2.67 Liver activity: SUV max NA NECK: No hypermetabolic lymph nodes in the neck. Incidental CT findings: None. CHEST: Soft tissue within the anterior mediastinum  measures 4.2 x 2.0 cm and has an SUV max of 6.08, image 66/4. On the previous PET-CT this had a similar morphologic appearance measuring 4.1 by 1.8 cm with SUV max 4.24. Index left axillary lymph node measures 0.8 cm with SUV max of 1.95 image 55/4. Previously 1.5 cm with SUV max of 6.57. Index left retropectoral lymph node measures 0.5 cm with SUV max of 1.95, image 48/4. formally this measured 1.2 cm with SUV max of 5.04. No tracer avid mediastinal or hilar adenopathy. No suspicious lung nodules identified. Incidental CT findings: None. ABDOMEN/PELVIS: No abnormal uptake identified within the liver, pancreas, or spleen. No tracer avid abdominal or pelvic lymph nodes. No tracer avid inguinal lymph nodes. Increased tracer uptake is again noted at the level of the anus with SUV max of 10.31, image 194/4. Formally SUV max was equal to 9.44. Incidental CT findings: There is an enlarged fibroid uterus containing several degenerating fibroids containing central low attenuation. SKELETON: No tracer avid bone metastases identified. Interval development of multiple FDG avid cutaneous soft tissue nodules, including: -Within the anterior left shoulder there is a cutaneous nodule with adjacent mild skin thickening measuring 1.5 x 1.0 cm with SUV max of 11.22, image 49/4. -Within the ventral chest wall, just left of the midline, there is a cutaneous nodule measuring 1.3 cm with SUV max  8.49, image 80/4. -Within the anteromedial left thigh there is a cutaneous soft tissue nodule measuring 0.9 cm within SUV max of 11.63. Incidental CT findings: None. IMPRESSION: 1. There is persistent increased radiotracer uptake associated with the anus. The degree of uptake is similar to the previous exam. Cannot exclude residual FDG avid tumor. 2. No signs of solid organ or nodal metastasis within the chest, abdomen or pelvis. 3. Interval resolution of previous FDG avid left axillary lymph nodes. 4. Interval increase in radiotracer uptake associated with anterior mediastinal soft tissue. The configuration of the soft tissue is consistent with the thymus gland. 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 5. Interval development of multiple FDG avid cutaneous soft tissue nodules within the anterior left shoulder, ventral chest wall, and anteromedial left thigh. These are nonspecific and may be inflammatory or infectious in etiology. Metastatic disease is considered less favored. Correlation with physical exam findings is advised. 6. Enlarged uterus containing degenerating fibroids. Electronically Signed   By: Kerby Moors M.D.   On: 07/09/2022 11:01    PERFORMANCE STATUS (ECOG) : 1 - Symptomatic but completely ambulatory  Review of Systems  Constitutional:  Positive for fatigue.  Gastrointestinal:  Positive for rectal pain.  Musculoskeletal:  Positive for back pain.   Unless otherwise noted, a complete review of systems is negative.  Physical Exam General: NAD Cardiovascular: regular rate and rhythm Pulmonary: clear ant fields, normal breathing pattern  Abdomen: soft, nontender, + bowel sounds GU: no suprapubic tenderness Extremities: no edema, no joint deformities Skin: no rashes Neurological:AAO x3, mood appropriate   IMPRESSION: This is my initial visit with Ms. Randol Kern. She presents to clinic today with her mother. No  acute distress. Some discomfort with sitting. Ambulatory without assistive devices. Alert and able to engage in discussions appropriately.   I introduced myself, [place] RN, and Palliative's role in collaboration with the oncology team. Concept of Palliative Care was introduced as specialized medical care for people and their families living with serious illness.  It focuses on providing relief from the symptoms and stress of a serious illness.  The goal is to improve quality of life for both the patient and the family. Values and goals of care important to patient and family were attempted to be elicited.   Etola lives in the home alone, however her best friend has been staying with her the past several months due to cancer diagnosis. She has 2 cats. No children. She was working at [employer] prior to diagnosis.    Able to perform all ADLs independently. Some limitations due to anal pain and fatigue.   Appetite fluctuates. She remains mindful of certain foods when she does have desire to eat with fear of constipation. Current weight is stable at 260lbs. Endorses fatigue. Often sleeps during the day. Is unable to rest well at night as pain seems worst. Occasional nausea. Reports vomiting episode several days prior. At times she feels nauseated which she contributes to her pain more specifically when having bowel movements.   Endorses ongoing rectal/anal pain. She is taking Miralax twice daily for bowel regimen. Cecely describes her pain as sharp, piercing, and stabbing. She gets some relief when taking hot showers. No other non-pharmacological interventions have provided relief. She is taking tramadol '50mg'$  as needed for pain. Rates pain 8/10 prior to medication. Decreased to 5/10 after. Unfortunately there is a shortage and she has been unable to obtain topical lidocaine. She has been taking gabapentin however discontinued use once she began taking tramadol. Education provided and encouraged her to restart.  She does note better relief when taking.   I created space and opportunity for Natalina to share her thoughts and feelings regarding her health. She is emotional. Shares she has some mild depression. Will have occasional episodes where she will cry. Feels this also contributes to her lack of rest at night. Emotional support provided. She is realistic in her understanding of disease and trajectory. She is remaining hopeful for improvement/stability allowing her every opportunity to continue to thrive.   We discussed ways to cope. Education provided on use of mirtazapine with benefits to assist with sleep, appetite, as well as depression. She and her mom verbalized understanding.   I discussed the importance of continued conversation with family and their medical providers regarding overall plan of care and treatment options, ensuring decisions are within the context of the patients values and GOCs.  PLAN: Established therapeutic relationship. Education provided on palliative's role in collaboration with their Oncology/Radiation team. Tramadol 50 mg as needed  Mirtazapine 7.5 mg at bedtime Gabapentin 300 mg three times daily Continue Miralax twice daily  Compazine as needed for nausea  We will continue to closely monitor and assist with symptom management as needed.  I will plan to see patient back in 2-4 weeks in collaboration to other oncology appointments.    Patient expressed understanding and was in agreement with this plan. She also understands that She can call the clinic at any time with any questions, concerns, or complaints.   Thank you for your referral and allowing Palliative to assist in Ms. Myah Latrece Girdner's care.   Number and complexity of problems addressed: HIGH - 1 or more chronic illnesses with SEVERE exacerbation, progression, or side effects of treatment - advanced cancer, pain. Any controlled substances utilized were prescribed in the context of palliative  care.  Time Total: 55 min   Visit consisted of counseling and education dealing with the complex and emotionally intense issues of symptom management and palliative care in the setting of serious and potentially life-threatening illness.Greater than 50%  of this time  was spent counseling and coordinating care related to the above assessment and plan.  Signed by: Alda Lea, AGPCNP-BC Palliative Medicine Team/[place]

## 2022-07-23 ENCOUNTER — Encounter: Payer: Medicaid Other | Attending: Radiation Oncology | Admitting: Physical Therapy

## 2022-07-23 ENCOUNTER — Encounter: Payer: Self-pay | Admitting: Hematology

## 2022-07-23 ENCOUNTER — Other Ambulatory Visit: Payer: Self-pay

## 2022-07-23 ENCOUNTER — Encounter: Payer: Self-pay | Admitting: Physical Therapy

## 2022-07-23 DIAGNOSIS — M6281 Muscle weakness (generalized): Secondary | ICD-10-CM | POA: Insufficient documentation

## 2022-07-23 DIAGNOSIS — M25652 Stiffness of left hip, not elsewhere classified: Secondary | ICD-10-CM | POA: Diagnosis present

## 2022-07-23 DIAGNOSIS — R102 Pelvic and perineal pain: Secondary | ICD-10-CM | POA: Insufficient documentation

## 2022-07-23 DIAGNOSIS — M25651 Stiffness of right hip, not elsewhere classified: Secondary | ICD-10-CM | POA: Diagnosis present

## 2022-07-23 NOTE — Therapy (Addendum)
 OUTPATIENT PHYSICAL THERAPY TREATMENT NOTE   Patient Name: Tammy Garrison MRN: [id number] DOB:1990/12/16, 31 y.o., female Today's Date: 07/23/2022  PCP: None    REFERRING PROVIDER: Lanell Donald Stagger, PA-C    END OF SESSION:   PT End of Session - 07/23/22 1037     Visit Number 6    Date for PT Re-Evaluation 07/17/22    Authorization Type [employer] medicaid    PT Start Time 1030    PT Stop Time 1115    PT Time Calculation (min) 45 min    Activity Tolerance Patient tolerated treatment well    Behavior During Therapy WFL for tasks assessed/performed             Past Medical History:  Diagnosis Date   Anemia    Bipolar 1 disorder (HCC)    Cancer (HCC)    squamous cell rectal cancer   ETOH abuse    H/O self-harm    H/O suicide attempt    x 5 - last 05/2016 - overdose Ibuprofen    Marijuana abuse    Rheumatoid arthritis (HCC)    Past Surgical History:  Procedure Laterality Date   HEMORRHOID SURGERY N/A 02/08/2022   Procedure: EXTERNAL AND INTERNAL HEMORRHOIDECTOMY;  Surgeon: Sebastian Moles, MD;  Location: [facility] OR;  Service: General;  Laterality: N/A;   INCISION AND DRAINAGE ABSCESS Right 04/22/2022   Procedure: INCISION AND DRAINAGE axilla;  Surgeon: Kinsinger, Herlene Righter, MD;  Location: [facility] ORS;  Service: General;  Laterality: Right;   RECTAL EXAM UNDER ANESTHESIA N/A 07/21/2022   Procedure: RECTAL EXAM UNDER ANESTHESIA;  Surgeon: Teresa Lonni HERO, MD;  Location: [facility] OR;  Service: General;  Laterality: N/A;   TRANSANAL HEMORRHOIDAL DEARTERIALIZATION N/A 07/21/2022   Procedure: ANAL CANAL BIOPSY;  Surgeon: Teresa Lonni HERO, MD;  Location: [facility] OR;  Service: General;  Laterality: N/A;   Patient Active Problem List   Diagnosis Date Noted   Fibroids 04/23/2022   Obesity (BMI 30-39.9) 04/22/2022   Abscess of right axilla 04/20/2022   Pancytopenia (HCC) 04/20/2022   PICC (peripherally inserted central catheter) in place 03/14/2022   Dehydration 03/13/2022    Nausea with vomiting 03/13/2022   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   Rationale for Evaluation and Treatment Rehabilitation   ONSET DATE: 02/08/2022   SUBJECTIVE:  SUBJECTIVE STATEMENT: I found out I do not have cancer and do not need the procedure. I see Dr. Teresa on 08/19/23 and do another exam. Right now working on pain management. I had a bowel movement and it is painful.     PAIN:  Are you having pain? Yes NPRS scale: 4/10 Pain location: Anterior, rectum   Pain type: burning and cramping Pain description: intermittent    Aggravating factors: difficulty with sitting, when she feels like she has to use the restroom.  Relieving factors: shower and lay on fan.    PRECAUTIONS: Other: anal cancer   WEIGHT BEARING RESTRICTIONS No   FALLS:  Has patient fallen in last 6 months? No   LIVING ENVIRONMENT: Lives with: lives alone   OCCUPATION: none   PLOF: Independent   PATIENT GOALS understand how to manage the pain, understand the changes the pelvis goes through and what to do   PERTINENT HISTORY:  Rheumatoid arthritis (HCC), anal cancer   BOWEL MOVEMENT Pain with bowel movement: Yes at level 4/10 Type of bowel movement:Type (Bristol Stool Scale) type 4 , Frequency every other day, and Strain Yes Fully empty rectum: No Leakage: yes, started 3 days ago  03/26/2022 Pads: No Fiber supplement: Yes: miralax  2x/day   URINATION Pain with urination: No Fully empty bladder: No when stands up  more urine comes out:   Stream: Strong Urgency: Yes: not able to hold the urine Frequency: 1.5 to 2 hours Leakage: Urge to void, Walking to the bathroom, Coughing, Sneezing, and Bending forward Pads: No   INTERCOURSE Pain with intercourse: Initial Penetration and During Penetration Ability to have vaginal penetration:  Yes: clitorus is sensitive Climax: no Marinoff Scale: 1/3        OBJECTIVE: (objective measures completed at initial evaluation unless otherwise dated)       DIAGNOSTIC FINDINGS:  none   PATIENT SURVEYS:  PFIQ-7 129; UIQ-7 43; CRAIQ-7 43; POPIQ-7 43  07/23/22: PFIQ-7 167; UIQ-7 52; CRAIQ-7 95; POPIQ-7 19 (Score is after radiation) COGNITION:            Overall cognitive status: Within functional limits for tasks assessed                          SENSATION:            Light touch: Appears intact            Proprioception: Appears intact                  POSTURE: No Significant postural limitations               PELVIC ALIGNMENT:   LUMBARAROM/PROM   A/PROM A/PROM  eval 07/23/22  Flexion Decreased by 25% Decreased by 25%  Extension full full  Right lateral flexion full full  Left lateral flexion full full  Right rotation full full  Left rotation full full   (Blank rows = not tested)   LOWER EXTREMITY ROM:   Passive ROM Right eval Left eval Right  07/23/22 Left  07/23/22  Hip flexion 120 110 115 100  Hip extension 10 10 -5 2  Hip abduction 20 25 15 10   Hip internal rotation 10 10 25 10   Hip external rotation 40 50 40 45   (Blank rows = not tested)   LOWER EXTREMITY MMT:   MMT Right eval Left eval Right/left  07/23/22  Hip flexion 4/5 4/5 4/5  Hip extension 4/5 4/5  3/5  Hip abduction 4/5 4/5 4/5  Hip internal rotation 4/5 4/5 4/5  Hip external rotation 4/5 4/5 4/5     PALPATION:   General                    External Perineal Exam tenderness located on the labia majora, labia minora, clitorus, and vulva                             Internal Pelvic Floor tenderness located in the introitus, only placed index finger in 1/4 inch to assess the strength   Patient confirms identification and approves PT to assess internal pelvic floor and treatment Yes   PELVIC MMT:   MMT eval  Vaginal 2/5 and circular but no lift holding for 5 sec  Internal Anal Sphincter    External Anal Sphincter    Puborectalis    Diastasis Recti    (Blank rows = not tested)         TONE: Increased due to pain   PROLAPSE: None noted   TODAY'S TREATMENT  07/23/2022 Manual: Soft tissue mobilization: Gentle effleurage to bilateral gluteals due to pain Manual work to bilateral lumbar paraspinals to elongate the muscles Myofascial release: Using suction cup to lumbar and gluteal to release the fascia Exercises: Stretches/mobility: Educated patient on how to use the dilators for opening of the vagina and working on length of vagina. She is to use the third to the last for length then the other size working on width.    06/18/2022 Manual: Soft tissue mobilization: patient laying on left side and therapist gently placed her fingers between the gluteal folds and performed gentle manual work and monitored for pain  Therapist performed manual work along the ischiocavernosus and pubic rami while monitoring for pain increase. Unable to go to the perineal body due to anxiety and pain.  Neuromuscular re-education: Down training: diaphragmatic breathing to relax the pelvic floor during the manual work Self-care: Educated patient on how to progress the use of the dilators and not to have pain above 3/10 and slowly placing the dilator in.  Educated patient on working between the gluteal folds with her hands to practice opening of the buttocks for an exam of the anus due to the pain and her scared of the pain Educated  patient on massaging the ischiocavernosus muscles  Educated patient on talking to her doctors of the pain with spreading her buttock cheeks for the anal exam Educated patient on talking to her gynecologist when she goes for an exam and the pain she is having with dilators. She may do better with a pediatric speculum     06/11/2022 Manual: Soft tissue mobilization: using the Addaday to the gluteal and levator ani in sidley Educated patient on how to perform manual work to the levator ani in sidely and the gluteals to relax the muscles and reduce pain Neuromuscular re-education: Down training: diaphragmatic breathing to reduce tension in the pelvic floor for toileting Self-care: Educated patient on how to use the vaginal dilators, how to progress them and continue for 2 years.       PATIENT EDUCATION:  05/07/2022 Education details: Access Code: [email or web address]; using a cream around the rectum, walking program, diet, no heat around the rectum Person educated: Patient Education method: Explanation, Demonstration, Tactile cues, Verbal cues, and Handouts Education comprehension: verbalized understanding, returned demonstration, verbal cues required, tactile cues required, and needs further education  HOME EXERCISE PROGRAM: 06/11/2022  Access Code: [email or web address], educated patient on how to use the dilator and progress.  URL: [email or web address] Date: 05/07/2022 Prepared by: Channing Pereyra   Program Notes lay on back with pillows under kneesLay on side with pillows between knees and feet.    Exercises - Seated Pelvic Floor Contraction  - 3 x daily - 7 x weekly - 1 sets - 5 reps - 5 sec hold - Hooklying Single Knee to Chest Stretch  - 2 x daily - 7 x weekly - 1 sets - 2 reps - 1 sec hold - Supine Figure 4 Piriformis Stretch  - 2 x daily - 7 x weekly - 1 sets - 2 reps - 30 sec hold - Supine Hamstring Stretch  - 2 x daily - 7 x weekly - 1 sets - 2 reps - 30 sec hold - Supine Lower Trunk  Rotation  - 2 x daily - 7 x weekly - 1 sets - 2 reps - 30 sec hold - Cat Cow  - 1 x daily - 7 x weekly - 3 sets - 10 reps - Supine Bridge  - 1 x daily - 7 x weekly - 1 sets - 10 reps   ASSESSMENT:   CLINICAL IMPRESSION: Patient is a 31 y.o. female who was seen today for physical therapy  treatment for anal cancer. Patient pain level is 4/10 instead of 8/10. Patient has type 4 stool.  Patient has tried the largest dilator and was 7/10. The dilator before the largest is pain level 5/10. Patient has to wipe multiple times after a bowel movement. Patient completed radiation on 04/20/22. She has decreased hip ROM  after she had radiation and her PFIQ-7 is 167 and prior to radiation was 129. Patient has to be under anesthesia to have her rectum examined due to her pain and muscle spasms.  Patient continues to leak urine and has made it difficulty for her to go out socially. Patient will benefit from skilled therapy to improve mobility, strength changes that occurred after radiation.    OBJECTIVE IMPAIRMENTS decreased activity tolerance, decreased coordination, decreased endurance, decreased ROM, decreased strength, increased fascial restrictions, and pain.    ACTIVITY LIMITATIONS sitting, standing, squatting, transfers, bed mobility, continence, toileting, and locomotion level   PARTICIPATION LIMITATIONS: cleaning, laundry, interpersonal relationship, and community activity   PERSONAL FACTORS Fitness, Past/current experiences, Time since onset of injury/illness/exacerbation, and 3+ comorbidities:    Rheumatoid arthritis (HCC), anal cancer with present chemotherapy and radiation treatment are also affecting patient's functional outcome.    REHAB POTENTIAL: Excellent   CLINICAL DECISION MAKING: Evolving/moderate complexity   EVALUATION COMPLEXITY: Moderate     GOALS: Goals reviewed with patient? Yes   SHORT TERM GOALS: Target date: 04/14/2022   Patient understands hip stretching program to reduce  the tightness that happens from radiation.  Baseline: not educated yet Goal status: Met 03/26/2022   2.  Patient understands correct toileting technique to reduce pain with bowel movements.  Baseline: not educated yet Goal status: Met 03/26/2022   3.  Patient understands how to perform diaphragmatic breathing to elongate the pelvic floor for a bowel movement.  Baseline: not educated yet Goal status: Met 06/11/2022   4.  Patient understands the importance of a walking program to manage endurance and balance.  Baseline: not educated yet Goal status: Met 05/06/2022   5.  Patient was educated on rectal health.  Baseline: not educated yet Goal status: Met 05/06/2022     LONG TERM  GOALS: Target date:  3/ 8/24 Patient independent with advanced HEP for core and hip strength.  Baseline: not educated yet Goal status: ongoing 07/23/22   2.  Pelvic floor strength >/= 3/5 to reduce her urinary leakage >/= 80%.  Baseline: strength is 2/5 and leaks with urge to void, walking to the bathroom, coughing, sneezing, and bending forward. Goal status: ongoing 07/23/22   3.  Patient reports her pain with bowel movements decreased >/= 2/10 due to healing of the tissue and correct relaxation of the pelvic floor.  Baseline: pain level 6/10 Goal status: ongoing 07/23/22   4.  Patient understands how to use the vaginal dilators to keep the opening of the introitus after radiation.  Baseline: not educated yet.  Goal status: ongoing 07/23/22  5.  Patient is able to relax her rectal muscles for an exam without having to be under anesthesia due to reduction of trigger points and elongation of tissue.  Baseline:  Goal status: INITIAL        PLAN: PT FREQUENCY: 1x/week   PT DURATION: other: for 4 months up to 12 visitis.    PLANNED INTERVENTIONS: Therapeutic exercises, Therapeutic activity, Neuromuscular re-education, Balance training, Patient/Family education, Self Care, Biofeedback, and Manual  therapy   PLAN FOR NEXT SESSION: see how it is going with the dilators, Manual work on the gluteal fold, gluteals, hamstring, and inner thigh and externally vaginally to relax the muscles and reduce her anxiety with internal work.   Channing Pereyra, PT 07/23/22 10:39 AM   PHYSICAL THERAPY DISCHARGE SUMMARY  Visits from Start of Care: 6  Current functional level related to goals / functional outcomes: See above. Patient did not return after her last visit on 07/23/22.    Remaining deficits: See above.    Education / Equipment: HEP   Patient agrees to discharge. Patient goals were not met. Patient is being discharged due to not returning since the last visit. Thank you for the referral.   Channing Pereyra, PT 07/04/24 2:58 PM

## 2022-07-24 ENCOUNTER — Inpatient Hospital Stay: Payer: Medicaid Other | Admitting: Licensed Clinical Social Worker

## 2022-07-24 ENCOUNTER — Encounter: Payer: Self-pay | Admitting: Hematology

## 2022-07-24 ENCOUNTER — Other Ambulatory Visit (HOSPITAL_COMMUNITY): Payer: Self-pay

## 2022-07-24 DIAGNOSIS — C210 Malignant neoplasm of anus, unspecified: Secondary | ICD-10-CM

## 2022-07-24 NOTE — Progress Notes (Signed)
[place] CSW Progress Note  Clinical Education officer, museum contacted patient by phone to discuss recent outpt procedure.  Pt ecstatic with results as she has been determined to be cancer free and will not need additional surgery or treatment at this time.  Pt to continue to follow w/ palliative care for symptom management, but is looking forward to the possibility of returning to work and "normal" life.  CSW to remain available as appropriate.       Henriette Combs, LCSW    Patient is participating in a Managed Medicaid Plan:  Yes

## 2022-07-27 ENCOUNTER — Other Ambulatory Visit: Payer: Self-pay

## 2022-07-28 ENCOUNTER — Encounter: Payer: Self-pay | Admitting: Nurse Practitioner

## 2022-07-28 ENCOUNTER — Other Ambulatory Visit: Payer: Self-pay

## 2022-07-28 ENCOUNTER — Inpatient Hospital Stay (HOSPITAL_BASED_OUTPATIENT_CLINIC_OR_DEPARTMENT_OTHER): Payer: Medicaid Other | Admitting: Nurse Practitioner

## 2022-07-28 DIAGNOSIS — G893 Neoplasm related pain (acute) (chronic): Secondary | ICD-10-CM

## 2022-07-28 DIAGNOSIS — R530 Neoplastic (malignant) related fatigue: Secondary | ICD-10-CM

## 2022-07-28 DIAGNOSIS — R630 Anorexia: Secondary | ICD-10-CM | POA: Diagnosis not present

## 2022-07-28 DIAGNOSIS — Z515 Encounter for palliative care: Secondary | ICD-10-CM | POA: Diagnosis not present

## 2022-07-28 NOTE — Progress Notes (Signed)
Tammy Garrison  [phone] [phone]   Name: Tammy Garrison Date: 07/28/2022 MRN: [id number]  DOB: September 26, 1990  Patient Care Team: [place] as PCP - General (Internal Medicine) [name], MD (Rheumatology) Tyson Dense, MD as Consulting Physician (Obstetrics and Gynecology) Truitt Merle, MD as Consulting Physician (Hematology and Oncology)   I connected with Tammy Garrison on 07/28/22 at 10:30 AM EST by phone and verified that I am speaking with the correct person using two identifiers.   I discussed the limitations, risks, security and privacy concerns of performing an evaluation and management service by telemedicine and the availability of in-person appointments. I also discussed with the patient that there may be a patient responsible charge related to this service. The patient expressed understanding and agreed to proceed.   Other persons participating in the visit and their role in the encounter: N/A   Patient's location: Home   Provider's location: [place]    Chief Complaint: Symptom Management follow-up   INTERVAL HISTORY: Tammy Garrison is a 31 y.o. 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 Palliative ask to see for symptom management.   SOCIAL HISTORY:     reports that she quit smoking about 3 years ago. Her smoking use included cigarettes. She has a 1.75 pack-year smoking history. She has never used smokeless tobacco. She reports that she does not currently use alcohol. She reports that she does not currently use drugs after having used the following drugs: Marijuana.  ADVANCE DIRECTIVES:    CODE STATUS:   PAST MEDICAL HISTORY: Past Medical History:  Diagnosis Date   Anemia    Bipolar 1 disorder ([place])    Cancer ([place])    squamous cell rectal cancer   ETOH abuse    H/O self-harm     H/O suicide attempt    x 5 - last 05/2016 - overdose Ibuprofen   Marijuana abuse    Rheumatoid arthritis (HCC)     ALLERGIES:  is allergic to coconut flavor.  MEDICATIONS:  Current Outpatient Medications  Medication Sig Dispense Refill   diphenhydrAMINE HCl, Sleep, ([employer] SLEEP-AID NIGHTTIME) 50 MG CAPS Take 100-150 mg by mouth at bedtime.     docusate sodium (COLACE) 100 MG capsule Take 200 mg by mouth daily.     gabapentin (NEURONTIN) 300 MG capsule Take 1 capsule (300 mg total) by mouth 3 (three) times daily. 60 capsule 0   Histamine Dihydrochloride (AUSTRALIAN DREAM ARTHRITIS EX) Apply 1 Application topically 3 (three) times daily as needed (pain.).     ibuprofen (ADVIL) 200 MG tablet Take 800 mg by mouth every 8 (eight) hours as needed (pain.).     lidocaine (REGENECARE HA) 2 % jelly Apply 1 Application topically daily. (Patient not taking: Reported on 07/09/2022) 85 g 0   LORazepam (ATIVAN) 0.5 MG tablet Take 1 tablet (0.5 mg total) by mouth every 6 (six) hours as needed (nausea). (Patient not taking: Reported on 07/09/2022) 20 tablet 0   megestrol (MEGACE) 40 MG tablet Take 1 tablet (40 mg total) by mouth 2 (two) times daily. (Patient not taking: Reported on 05/19/2022) 28 tablet 0   Menthol, Topical Analgesic, (BIOFREEZE EX) Apply 1 Application topically 3 (three) times daily as needed (pain.).     mirtazapine (REMERON) 7.5 MG tablet Take 1 tablet (7.5 mg total) by mouth at bedtime. 15 tablet 0   polyethylene glycol (MIRALAX / GLYCOLAX)  17 g packet Take 17 g by mouth daily.     prochlorperazine (COMPAZINE) 10 MG tablet Take 1 tablet (10 mg total) by mouth every 6 (six) hours as needed for nausea or vomiting. 30 tablet 1   Pseudoeph-Doxylamine-DM-APAP (NYQUIL PO) Take 15-30 mLs by mouth at bedtime as needed (cold symptoms (hold sleep-aid when using)).     traMADol (ULTRAM) 50 MG tablet Take 1 tablet (50 mg total) by mouth every 6 (six) hours as needed (postop pain not controlled with  tylenol/ibuprofen first). 60 tablet 0   No current facility-administered medications for this visit.   Facility-Administered Medications Ordered in Other Visits  Medication Dose Route Frequency Provider Last Rate Last Admin   0.9 %  sodium chloride infusion (Manually program via Guardrails IV Fluids)  250 mL Intravenous Once Truitt Merle, MD       heparin lock flush 100 unit/mL  250 Units Intracatheter Once Truitt Merle, MD       sodium chloride flush (NS) 0.9 % injection 10 mL  10 mL Intracatheter Once Truitt Merle, MD       sodium chloride flush (NS) 0.9 % injection 10 mL  10 mL Intracatheter Once Truitt Merle, MD       sodium chloride flush (NS) 0.9 % injection 10 mL  10 mL Intracatheter Once Truitt Merle, MD        VITAL SIGNS: LMP 05/11/2022 Comment: has hx of fibroids There were no vitals filed for this visit.  Estimated body mass index is 35.29 kg/m as calculated from the following:   Height as of 07/21/22: 6' (1.829 m).   Weight as of 07/22/22: 260 lb 3.2 oz (118 kg).   PERFORMANCE STATUS (ECOG) : 1 - Symptomatic but completely ambulatory   IMPRESSION: I connected with Tammy Garrison by phone for symptom management follow-up. Unfortunately she shares today is not the best day for her. She has been in pain since bowel movement at 7am this morning. She did take her tramadol as needed with minimal relief. Prior to this she had a good holiday and pain was controlled.   I instructed patient to take additional dose of tramadol during our discussion to assist with her pain. She is lying on the couch trying to get some rest. Speaks of desire for "life to get back to normal". Support provided.   We discussed at length current regimen. She will continue to take tramadol 1-2 tabs every 6 hrs as needed for pain. Gabapentin three times daily which she has also noticed some improvement.   Her appetite is fair as well as sleep cycle. She did not get her mirtazapine filled but does plan to pick up today. Denies  nausea.   She understands plan to continue close follow-up.   PLAN: Tramadol 50-'100mg'$  every 6 hours as needed Mirtazapine 7.5 mg at bedtime Gabapentin 300 mg three times daily Compazine as needed for nausea  We will continue to closely monitor and assist with symptom management as needed.  I will plan to see patient back in 2-4 weeks in collaboration to other oncology appointments. We will plan for follow-up call on Friday to assess pain.   Patient expressed understanding and was in agreement with this plan. She also understands that She can call the clinic at any time with any questions, concerns, or complaints.   Any controlled substances utilized were prescribed in the context of palliative care. PDMP has been reviewed.   Time Total: 45 min   Visit consisted of counseling and  education dealing with the complex and emotionally intense issues of symptom management and palliative care in the setting of serious and potentially life-threatening illness.Greater than 50%  of this time was spent counseling and coordinating care related to the above assessment and plan.  Alda Lea, AGPCNP-BC  Palliative Medicine Team/[place]

## 2022-07-31 ENCOUNTER — Inpatient Hospital Stay: Payer: Medicaid Other | Admitting: Nurse Practitioner

## 2022-07-31 ENCOUNTER — Other Ambulatory Visit (HOSPITAL_COMMUNITY): Payer: Self-pay

## 2022-07-31 ENCOUNTER — Encounter: Payer: Self-pay | Admitting: Hematology

## 2022-07-31 DIAGNOSIS — M792 Neuralgia and neuritis, unspecified: Secondary | ICD-10-CM

## 2022-07-31 DIAGNOSIS — C210 Malignant neoplasm of anus, unspecified: Secondary | ICD-10-CM

## 2022-07-31 DIAGNOSIS — Z515 Encounter for palliative care: Secondary | ICD-10-CM

## 2022-07-31 MED ORDER — GABAPENTIN 300 MG PO CAPS
300.0000 mg | ORAL_CAPSULE | Freq: Three times a day (TID) | ORAL | 0 refills | Status: DC
  Filled 2022-07-31: qty 90, 30d supply, fill #0

## 2022-07-31 NOTE — Progress Notes (Signed)
I connected with this pt by phone today. This palliative RN called pt to check in, on 12/26 pt reported having a "bad day", having increased pain, and trouble with BMs. Today upon assessment pt reports pain is under control and she is having bowel movements now without pain. Pt also made RN aware of need for gabapentin refill, see new orders. Pt reports not picking up the Remeron yet, she plans to do so today with the gabapentin. Pt reeducated on how to take Remeron and what it was prescribed for, pt verbalized understanding. Pt has no further questions or concerns at this time.

## 2022-08-16 ENCOUNTER — Ambulatory Visit (HOSPITAL_COMMUNITY): Payer: Self-pay

## 2022-08-20 ENCOUNTER — Other Ambulatory Visit: Payer: Self-pay

## 2022-08-20 ENCOUNTER — Inpatient Hospital Stay (HOSPITAL_BASED_OUTPATIENT_CLINIC_OR_DEPARTMENT_OTHER): Payer: Medicaid Other | Admitting: Hematology

## 2022-08-20 ENCOUNTER — Inpatient Hospital Stay (HOSPITAL_BASED_OUTPATIENT_CLINIC_OR_DEPARTMENT_OTHER): Payer: Medicaid Other | Admitting: Nurse Practitioner

## 2022-08-20 ENCOUNTER — Inpatient Hospital Stay: Payer: Medicaid Other | Attending: Hematology

## 2022-08-20 ENCOUNTER — Other Ambulatory Visit (HOSPITAL_COMMUNITY): Payer: Self-pay

## 2022-08-20 ENCOUNTER — Encounter: Payer: Self-pay | Admitting: Nurse Practitioner

## 2022-08-20 ENCOUNTER — Encounter: Payer: Self-pay | Admitting: Hematology

## 2022-08-20 VITALS — BP 139/101 | HR 54 | Temp 97.8°F | Resp 18 | Ht 72.0 in | Wt 254.8 lb

## 2022-08-20 DIAGNOSIS — M069 Rheumatoid arthritis, unspecified: Secondary | ICD-10-CM | POA: Diagnosis not present

## 2022-08-20 DIAGNOSIS — C210 Malignant neoplasm of anus, unspecified: Secondary | ICD-10-CM | POA: Diagnosis not present

## 2022-08-20 DIAGNOSIS — F32A Depression, unspecified: Secondary | ICD-10-CM

## 2022-08-20 DIAGNOSIS — M25562 Pain in left knee: Secondary | ICD-10-CM | POA: Diagnosis not present

## 2022-08-20 DIAGNOSIS — Z79899 Other long term (current) drug therapy: Secondary | ICD-10-CM | POA: Diagnosis not present

## 2022-08-20 DIAGNOSIS — G893 Neoplasm related pain (acute) (chronic): Secondary | ICD-10-CM

## 2022-08-20 DIAGNOSIS — D500 Iron deficiency anemia secondary to blood loss (chronic): Secondary | ICD-10-CM | POA: Insufficient documentation

## 2022-08-20 DIAGNOSIS — Z515 Encounter for palliative care: Secondary | ICD-10-CM

## 2022-08-20 DIAGNOSIS — R630 Anorexia: Secondary | ICD-10-CM | POA: Diagnosis not present

## 2022-08-20 DIAGNOSIS — M059 Rheumatoid arthritis with rheumatoid factor, unspecified: Secondary | ICD-10-CM

## 2022-08-20 DIAGNOSIS — M25561 Pain in right knee: Secondary | ICD-10-CM | POA: Insufficient documentation

## 2022-08-20 DIAGNOSIS — R634 Abnormal weight loss: Secondary | ICD-10-CM

## 2022-08-20 LAB — COMPREHENSIVE METABOLIC PANEL
ALT: 33 U/L (ref 0–44)
AST: 19 U/L (ref 15–41)
Albumin: 3.6 g/dL (ref 3.5–5.0)
Alkaline Phosphatase: 100 U/L (ref 38–126)
Anion gap: 7 (ref 5–15)
BUN: 9 mg/dL (ref 6–20)
CO2: 29 mmol/L (ref 22–32)
Calcium: 9.9 mg/dL (ref 8.9–10.3)
Chloride: 102 mmol/L (ref 98–111)
Creatinine, Ser: 0.63 mg/dL (ref 0.44–1.00)
GFR, Estimated: >60 mL/min (ref >60)
Glucose, Bld: 98 mg/dL (ref 70–99)
Potassium: 3.8 mmol/L (ref 3.5–5.1)
Sodium: 138 mmol/L (ref 135–145)
Total Bilirubin: 0.2 mg/dL — ABNORMAL LOW (ref 0.3–1.2)
Total Protein: 8.3 g/dL — ABNORMAL HIGH (ref 6.5–8.1)

## 2022-08-20 LAB — CBC WITH DIFFERENTIAL/PLATELET
Abs Immature Granulocytes: 0.03 10*3/uL (ref 0.00–0.07)
Basophils Absolute: 0 10*3/uL (ref 0.0–0.1)
Basophils Relative: 0 %
Eosinophils Absolute: 0.2 10*3/uL (ref 0.0–0.5)
Eosinophils Relative: 2 %
HCT: 35.1 % — ABNORMAL LOW (ref 36.0–46.0)
Hemoglobin: 11.6 g/dL — ABNORMAL LOW (ref 12.0–15.0)
Immature Granulocytes: 0 %
Lymphocytes Relative: 6 %
Lymphs Abs: 0.5 10*3/uL — ABNORMAL LOW (ref 0.7–4.0)
MCH: 29.1 pg (ref 26.0–34.0)
MCHC: 33 g/dL (ref 30.0–36.0)
MCV: 88.2 fL (ref 80.0–100.0)
Monocytes Absolute: 0.6 10*3/uL (ref 0.1–1.0)
Monocytes Relative: 7 %
Neutro Abs: 7.2 10*3/uL (ref 1.7–7.7)
Neutrophils Relative %: 85 %
Platelets: 473 10*3/uL — ABNORMAL HIGH (ref 150–400)
RBC: 3.98 MIL/uL (ref 3.87–5.11)
RDW: 13.8 % (ref 11.5–15.5)
WBC: 8.6 10*3/uL (ref 4.0–10.5)
nRBC: 0 % (ref 0.0–0.2)

## 2022-08-20 LAB — FERRITIN: Ferritin: 182 ng/mL (ref 11–307)

## 2022-08-20 MED ORDER — TRAMADOL-ACETAMINOPHEN 37.5-325 MG PO TABS
1.0000 | ORAL_TABLET | Freq: Four times a day (QID) | ORAL | 0 refills | Status: DC | PRN
  Filled 2022-08-20: qty 60, 15d supply, fill #0

## 2022-08-20 MED ORDER — MIRTAZAPINE 15 MG PO TABS
15.0000 mg | ORAL_TABLET | Freq: Every day | ORAL | 0 refills | Status: DC
  Filled 2022-08-20: qty 30, 30d supply, fill #0

## 2022-08-20 NOTE — Assessment & Plan Note (Signed)
cT2N0Mx, with hypermetabolic left axillary nodes, HPV(+) -presented with worsening rectal bleeding, began passing clots and was admitted 02/06/22 for anemia from blood loss. S/p hemorrhoidectomy on 02/08/22 by Dr. 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 -PET scan 02/26/22 showed: intense uptake to anus; no signs of solid organ or FDG-avid nodal metastasis within abdomen or pelvis, hypermetabolic left axillary lymph nodes.  Sequently left axillary lymph node biopsy was benign. -she established care with Dr. Dema Severin on 03/03/22. -s/p concurrent chemoRT with mitomycin/5FU 03/09/22 - 04/20/22, tolerated moderately well with significant rectal pain. -Restaging PET scan on July 08, 2022 showed persistent increased radiotracer uptake associated with the anus, suspicious for residual disease.  She also had persistent significant anal pain.  She underwent exam and a biopsy by Dr. Dema Severin in [place] on July 21, 2022, which was negative for residual disease.

## 2022-08-20 NOTE — Progress Notes (Signed)
[place]  Telephone:(336) [phone] Fax:(336) [phone]   Name: Tammy Garrison Date: 08/20/2022 MRN: [id number]  DOB: 14-Mar-1991  Patient Care Team: [place] as PCP - General (Internal Medicine) [name], MD (Rheumatology) Tyson Dense, MD as Consulting Physician (Obstetrics and Gynecology) Truitt Merle, MD as Consulting Physician (Hematology and Oncology)     INTERVAL HISTORY: Tammy Garrison is a 32 y.o. 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 Palliative ask to see for symptom management.   SOCIAL HISTORY:     reports that Tammy Garrison quit smoking about 3 years ago. Her smoking use included cigarettes. Tammy Garrison has a 1.75 pack-year smoking history. Tammy Garrison has never used smokeless tobacco. Tammy Garrison reports that Tammy Garrison does not currently use alcohol. Tammy Garrison reports that Tammy Garrison does not currently use drugs after having used the following drugs: Marijuana.  ADVANCE DIRECTIVES:    CODE STATUS:   PAST MEDICAL HISTORY: Past Medical History:  Diagnosis Date   Anemia    Bipolar 1 disorder ([place])    Cancer ([place])    squamous cell rectal cancer   ETOH abuse    H/O self-harm    H/O suicide attempt    x 5 - last 05/2016 - overdose Ibuprofen   Marijuana abuse    Rheumatoid arthritis ([facility])     ALLERGIES:  is allergic to coconut flavor.  MEDICATIONS:  Current Outpatient Medications  Medication Sig Dispense Refill   diphenhydrAMINE HCl, Sleep, ([employer] SLEEP-AID NIGHTTIME) 50 MG CAPS Take 100-150 mg by mouth at bedtime.     docusate sodium (COLACE) 100 MG capsule Take 200 mg by mouth daily.     gabapentin (NEURONTIN) 300 MG capsule Take 1 capsule (300 mg total) by mouth 3 (three) times daily. 90 capsule 0   Histamine Dihydrochloride (AUSTRALIAN DREAM ARTHRITIS EX) Apply 1 Application topically 3 (three) times daily as needed (pain.).     ibuprofen (ADVIL) 200 MG tablet  Take 800 mg by mouth every 8 (eight) hours as needed (pain.).     lidocaine (REGENECARE HA) 2 % jelly Apply 1 Application topically daily. (Patient not taking: Reported on 07/09/2022) 85 g 0   LORazepam (ATIVAN) 0.5 MG tablet Take 1 tablet (0.5 mg total) by mouth every 6 (six) hours as needed (nausea). (Patient not taking: Reported on 07/09/2022) 20 tablet 0   megestrol (MEGACE) 40 MG tablet Take 1 tablet (40 mg total) by mouth 2 (two) times daily. (Patient not taking: Reported on 05/19/2022) 28 tablet 0   Menthol, Topical Analgesic, (BIOFREEZE EX) Apply 1 Application topically 3 (three) times daily as needed (pain.).     mirtazapine (REMERON) 7.5 MG tablet Take 1 tablet (7.5 mg total) by mouth at bedtime. 15 tablet 0   polyethylene glycol (MIRALAX / GLYCOLAX) 17 g packet Take 17 g by mouth daily.     prochlorperazine (COMPAZINE) 10 MG tablet Take 1 tablet (10 mg total) by mouth every 6 (six) hours as needed for nausea or vomiting. 30 tablet 1   Pseudoeph-Doxylamine-DM-APAP (NYQUIL PO) Take 15-30 mLs by mouth at bedtime as needed (cold symptoms (hold sleep-aid when using)).     traMADol (ULTRAM) 50 MG tablet Take 1 tablet (50 mg total) by mouth every 6 (six) hours as needed (postop pain not controlled with tylenol/ibuprofen first). 60 tablet 0   No current facility-administered medications for this visit.   Facility-Administered Medications Ordered in Other Visits  Medication Dose Route Frequency  Provider Last Rate Last Admin   0.9 %  sodium chloride infusion (Manually program via Guardrails IV Fluids)  250 mL Intravenous Once Truitt Merle, MD       heparin lock flush 100 unit/mL  250 Units Intracatheter Once Truitt Merle, MD       sodium chloride flush (NS) 0.9 % injection 10 mL  10 mL Intracatheter Once Truitt Merle, MD       sodium chloride flush (NS) 0.9 % injection 10 mL  10 mL Intracatheter Once Truitt Merle, MD       sodium chloride flush (NS) 0.9 % injection 10 mL  10 mL Intracatheter Once Truitt Merle, MD         VITAL SIGNS: There were no vitals taken for this visit. There were no vitals filed for this visit.  Estimated body mass index is 35.29 kg/m as calculated from the following:   Height as of 07/21/22: 6' (1.829 m).   Weight as of 07/22/22: 260 lb 3.2 oz (118 kg).   PERFORMANCE STATUS (ECOG) : 1 - Symptomatic but completely ambulatory  Assessment NAD RRR Normal breathing pattern  AAO x3  IMPRESSION: Ms. Visconti presents to clinic today for follow-up. No acute distress. Tammy Garrison shares Tammy Garrison goes to [place] every 2-3 weeks to help support her elderly grandmother. Tammy Garrison is emotional expressing her hopes of one day being able to return to work and a somewhat normal quality of life.   Tammy Garrison is trying to remain as active as possible however with some limitations due to her rheumatoid arthritis and changes with colder temperatures. Tammy Garrison is having difficulty with ambulating due to joint stiffness and pain. Shares difficulty in changing positions suddenly or getting into the shower. Her bestfriend has been assisting her with ADLs.   Per discussions with Dr. Burr Medico patient is being referred to a Rheumatologist for further evaluation and support. Tammy Garrison shares Tammy Garrison has been taking tramadol with Tylenol with some relief. Also has tried topical creams such as biofreeeze and voltaren cream. Tolerating gabapentin for neuropathic discomfort.   Her appetite continues to be minimal. Tammy Garrison at times has no desire to eat. Endorses changes in taste as well as early satiety at times. Her current weight is 254lbs down from 260 lbs on 12/20. Tammy Garrison tolerate mirtazapine however did not have a refill of medication. Reports moderate improvement with 7.5 mg dose. Encouraged patient to continue taking for appetite support.    Her appetite is fair as well as sleep cycle. Tammy Garrison did not get her mirtazapine filled but does plan to pick up today. Denies nausea.   Tammy Garrison understands plan to continue close follow-up.    PLAN: Ultracet every 6 hours as needed Mirtazapine 15 mg mg at bedtime Gabapentin 300 mg three times daily Compazine as needed for nausea  We will continue to closely monitor and assist with symptom management as needed.  I will plan to see patient back in 3-4 weeks in collaboration to other oncology appointments. We will plan for follow-up call on Friday to assess pain.   Patient expressed understanding and was in agreement with this plan. Tammy Garrison also understands that Tammy Garrison can call the clinic at any time with any questions, concerns, or complaints.     Any controlled substances utilized were prescribed in the context of palliative care. PDMP has been reviewed.    Time Total: 30 min   Visit consisted of counseling and education dealing with the complex and emotionally intense issues of symptom management and palliative care in  the setting of serious and potentially life-threatening illness.Greater than 50%  of this time was spent counseling and coordinating care related to the above assessment and plan.  Alda Lea, AGPCNP-BC  Palliative Medicine Team/[place] [facility]

## 2022-08-20 NOTE — Progress Notes (Signed)
[place]   Telephone:(336) [phone] Fax:(336) [phone]   Clinic Follow up Note   Patient Care Team: [place] as PCP - General (Internal Medicine) [name], MD (Rheumatology) Tyson Dense, MD as Consulting Physician (Obstetrics and Gynecology) Truitt Merle, MD as Consulting Physician (Hematology and Oncology)  Date of Service:  08/20/2022  CHIEF COMPLAINT: f/u of anal cancer   CURRENT THERAPY: Surveillance    ASSESSMENT:  Tammy Garrison is a 32 y.o. 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 S/p hemorrhoidectomy on 02/08/22 by Dr. 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 -PET scan 02/26/22 showed: intense uptake to anus; no signs of solid organ or FDG-avid nodal metastasis within abdomen or pelvis, hypermetabolic left axillary lymph nodes.  Sequently left axillary lymph node biopsy was benign. -she established care with Dr. Dema Severin on 03/03/22. -s/p concurrent chemoRT with mitomycin/5FU 03/09/22 - 04/20/22, tolerated moderately well with significant rectal pain. -Restaging PET scan on July 08, 2022 showed persistent increased radiotracer uptake associated with the anus, suspicious for residual disease.  She also had persistent significant anal pain.  She underwent exam and a biopsy by Dr. Dema Severin in [place] on July 21, 2022, which was negative for residual disease. -Her rectal pain has resolved female, no clinical concern for residual cancer -Will continue cancer surveillance.  Plan to repeat a CT scan in 6 months. -She is scheduled to follow-up with Dr. Dema Severin in a few weeks.  Rheumatoid arthritis -She has history of rheumatoid arthritis, previous seen by rheumatology  before she moved to [facility] -She has worsening bilateral knee pain, I will refer her to rheumatologist Dr. Benjamine Mola   PLAN: -lab reviewed, she is clinically doing well, no concern for residual cancer. -Referral to Rheumatology Dr. Benjamine Mola -she will call [facility] to establish care -f/u in 5 months with lab and CT scan a few days before -she will f/u with Dr. Dema Severin in a few weeks   SUMMARY OF ONCOLOGIC HISTORY: Oncology History  Anal squamous cell carcinoma ([place])  02/08/2022 Pathology Results   FINAL MICROSCOPIC DIAGNOSIS:   A. SOFT TISSUE, ANTERIOR MASS VS. HEMORRHOID, EXCISION:  Invasive squamous cell carcinoma, focally keratinizing and  well-differentiated.  Carcinoma is present at the margins of excision.   B. HEMORRHOID, INTERNAL, EXCISION:  Hemorrhoid without the overlying epithelium.  Separate fragment of benign squamous epithelium.  Negative for neoplasm.   ADDENDUM:  P16 immunostain is strongly positive suggestive of this lesion being the HPV related.    02/08/2022 Cancer Staging   Staging form: Anus, AJCC V9 - Clinical stage from 02/08/2022: Stage IIA (cT2, cN0, cM0) - Signed by Truitt Merle, MD on 02/23/2022 Stage prefix: Initial diagnosis   02/23/2022 Initial Diagnosis   Anal cancer ([place])   03/09/2022 - 03/13/2022 Chemotherapy   Patient is on Treatment Plan : ANUS Mitomycin D1,28 / 5FU D1-4, 28-31 q32d     03/09/2022 -  Chemotherapy   Patient is on Treatment Plan : ANUS Mitomycin D1,28 + 5FU D1-4, 28-31 q32d      PET scan   IMPRESSION: 1. There is persistent increased radiotracer uptake associated with the anus. The degree of uptake is similar to the previous exam. Cannot exclude residual FDG avid tumor. 2. No  signs of solid organ or nodal metastasis within the chest, abdomen or pelvis. 3. Interval resolution of previous FDG avid left axillary lymph nodes. 4. Interval increase in radiotracer uptake associated with anterior mediastinal soft tissue. The configuration  of the soft tissue is consistent with the thymus gland. 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 5. Interval development of multiple FDG avid cutaneous soft tissue nodules within the anterior left shoulder, ventral chest wall, and anteromedial left thigh. These are nonspecific and may be inflammatory or infectious in etiology. Metastatic disease is considered less favored. Correlation with physical exam findings is advised. 6. Enlarged uterus containing degenerating fibroids.      INTERVAL HISTORY:  Tammy Garrison is here for a follow up of anal cancer  She was last seen by NP Lacie on 07/09/2022 She presents to the clinic alone. Pt states she has no pain in the anal area beside her arthritis. Pt states she take Miralax once a day, but she has a Bm 4-5 time a a day.     All other systems were reviewed with the patient and are negative.  MEDICAL HISTORY:  Past Medical History:  Diagnosis Date   Anemia    Bipolar 1 disorder ([place])    Cancer ([place])    squamous cell rectal cancer   ETOH abuse    H/O self-harm    H/O suicide attempt    x 5 - last 05/2016 - overdose Ibuprofen   Marijuana abuse    Rheumatoid arthritis ([place])     SURGICAL HISTORY: Past Surgical History:  Procedure Laterality Date   HEMORRHOID SURGERY N/A 02/08/2022   Procedure: EXTERNAL AND INTERNAL HEMORRHOIDECTOMY;  Surgeon: Georganna Skeans, MD;  Location: [facility];  Service: General;  Laterality: N/A;   INCISION AND DRAINAGE ABSCESS Right 04/22/2022   Procedure: INCISION AND DRAINAGE axilla;  Surgeon: Kieth Brightly, Arta Bruce, MD;  Location: [facility];  Service: General;  Laterality: Right;   RECTAL EXAM UNDER ANESTHESIA N/A 07/21/2022   Procedure: RECTAL EXAM UNDER ANESTHESIA;  Surgeon: Ileana Roup, MD;  Location: [facility];  Service: General;  Laterality: N/A;   TRANSANAL HEMORRHOIDAL DEARTERIALIZATION N/A 07/21/2022   Procedure: ANAL CANAL  BIOPSY;  Surgeon: Ileana Roup, MD;  Location: [facility];  Service: General;  Laterality: N/A;    I have reviewed the social history and family history with the patient and they are unchanged from previous note.  ALLERGIES:  is allergic to coconut flavor.  MEDICATIONS:  Current Outpatient Medications  Medication Sig Dispense Refill   diphenhydrAMINE HCl, Sleep, ([employer]S SLEEP-AID NIGHTTIME) 50 MG CAPS Take 100-150 mg by mouth at bedtime.     docusate sodium (COLACE) 100 MG capsule Take 200 mg by mouth daily.     gabapentin (NEURONTIN) 300 MG capsule Take 1 capsule (300 mg total) by mouth 3 (three) times daily. 90 capsule 0   Histamine Dihydrochloride (AUSTRALIAN DREAM ARTHRITIS EX) Apply 1 Application topically 3 (three) times daily as needed (pain.).     ibuprofen (ADVIL) 200 MG tablet Take 800 mg by mouth every 8 (eight) hours as needed (pain.).     lidocaine (REGENECARE HA) 2 % jelly Apply 1 Application topically daily. (Patient not taking: Reported on 07/09/2022) 85 g 0   LORazepam (ATIVAN) 0.5 MG tablet Take 1 tablet (0.5 mg total) by mouth every 6 (six) hours as needed (nausea). (Patient not taking: Reported on 07/09/2022) 20 tablet 0   megestrol (MEGACE)  40 MG tablet Take 1 tablet (40 mg total) by mouth 2 (two) times daily. (Patient not taking: Reported on 05/19/2022) 28 tablet 0   Menthol, Topical Analgesic, (BIOFREEZE EX) Apply 1 Application topically 3 (three) times daily as needed (pain.).     mirtazapine (REMERON) 7.5 MG tablet Take 1 tablet (7.5 mg total) by mouth at bedtime. 15 tablet 0   polyethylene glycol (MIRALAX / GLYCOLAX) 17 g packet Take 17 g by mouth daily.     prochlorperazine (COMPAZINE) 10 MG tablet Take 1 tablet (10 mg total) by mouth every 6 (six) hours as needed for nausea or vomiting. 30 tablet 1   Pseudoeph-Doxylamine-DM-APAP (NYQUIL PO) Take 15-30 mLs by mouth at bedtime as needed (cold symptoms (hold sleep-aid when using)).     traMADol (ULTRAM) 50 MG tablet Take  1 tablet (50 mg total) by mouth every 6 (six) hours as needed (postop pain not controlled with tylenol/ibuprofen first). 60 tablet 0   No current facility-administered medications for this visit.   Facility-Administered Medications Ordered in Other Visits  Medication Dose Route Frequency Provider Last Rate Last Admin   0.9 %  sodium chloride infusion (Manually program via Guardrails IV Fluids)  250 mL Intravenous Once Truitt Merle, MD       heparin lock flush 100 unit/mL  250 Units Intracatheter Once Truitt Merle, MD       sodium chloride flush (NS) 0.9 % injection 10 mL  10 mL Intracatheter Once Truitt Merle, MD       sodium chloride flush (NS) 0.9 % injection 10 mL  10 mL Intracatheter Once Truitt Merle, MD       sodium chloride flush (NS) 0.9 % injection 10 mL  10 mL Intracatheter Once Truitt Merle, MD        PHYSICAL EXAMINATION: ECOG PERFORMANCE STATUS: 1 - Symptomatic but completely ambulatory  Vitals:   08/20/22 1324  BP: (!) 139/101  Pulse: (!) 54  Resp: 18  Temp: 97.8 F (36.6 C)  SpO2: 100%   Wt Readings from Last 3 Encounters:  08/20/22 254 lb 12.8 oz (115.6 kg)  07/22/22 260 lb 3.2 oz (118 kg)  07/21/22 254 lb (115.2 kg)     GENERAL:alert, no distress and comfortable SKIN: skin color normal, no rashes or significant lesions EYES: normal, Conjunctiva are pink and non-injected, sclera clear  NEURO: alert & oriented x 3 with fluent speech  LABORATORY DATA:  I have reviewed the data as listed    Latest Ref Rng & Units 08/20/2022   12:52 PM 07/09/2022   11:31 AM 06/10/2022    8:54 AM  CBC  WBC 4.0 - 10.5 K/uL 8.6  4.3  5.6   Hemoglobin 12.0 - 15.0 g/dL 11.6  12.5  12.0   Hematocrit 36.0 - 46.0 % 35.1  38.3  36.7   Platelets 150 - 400 K/uL 473  277  288         Latest Ref Rng & Units 08/20/2022   12:52 PM 07/09/2022   11:31 AM 06/10/2022    8:54 AM  CMP  Glucose 70 - 99 mg/dL 98  109  101   BUN 6 - 20 mg/dL '9  10  15   '$ Creatinine 0.44 - 1.00 mg/dL 0.63  0.87  0.88    Sodium 135 - 145 mmol/L 138  141  139   Potassium 3.5 - 5.1 mmol/L 3.8  3.8  3.9   Chloride 98 - 111 mmol/L 102  106  105  CO2 22 - 32 mmol/L '29  28  26   '$ Calcium 8.9 - 10.3 mg/dL 9.9  9.8  9.7   Total Protein 6.5 - 8.1 g/dL 8.3  7.4  7.4   Total Bilirubin 0.3 - 1.2 mg/dL 0.2  0.3  0.4   Alkaline Phos 38 - 126 U/L 100  74  62   AST 15 - 41 U/L '19  15  14   '$ ALT 0 - 44 U/L 33  18  16       RADIOGRAPHIC STUDIES: I have personally reviewed the radiological images as listed and agreed with the findings in the report. No results found.    No orders of the defined types were placed in this encounter.  All questions were answered. The patient knows to call the clinic with any problems, questions or concerns. No barriers to learning was detected. The total time spent in the appointment was 25 minutes.     Truitt Merle, MD 08/20/2022   Felicity Coyer, CMA, am acting as scribe for Truitt Merle, MD.   I have reviewed the above documentation for accuracy and completeness, and I agree with the above.

## 2022-08-21 ENCOUNTER — Encounter: Payer: Self-pay | Admitting: Hematology

## 2022-08-21 ENCOUNTER — Telehealth: Payer: Self-pay | Admitting: Hematology

## 2022-08-21 ENCOUNTER — Other Ambulatory Visit (HOSPITAL_COMMUNITY): Payer: Self-pay

## 2022-08-21 NOTE — Telephone Encounter (Signed)
Confirmed all upcoming appointments with patient

## 2022-08-22 ENCOUNTER — Encounter: Payer: Self-pay | Admitting: Hematology

## 2022-09-01 ENCOUNTER — Telehealth: Payer: Self-pay

## 2022-09-01 NOTE — Telephone Encounter (Signed)
Called patient to answer a my chart question. Instructed patient to go by Dr. Sebastian Ache office her former Rheumatologist  to sign a medical release of information form so that they can send your medical records to Dr. Marveen Reeks office. New Rheumatologist office,  so that you can see him. Patient voiced understanding.

## 2022-09-08 ENCOUNTER — Telehealth: Payer: Self-pay

## 2022-09-08 NOTE — Telephone Encounter (Signed)
Called patient to confirm if she went by former Rheumatologist office and signed a release of information so that her records could be sent to Dr. Benjamine Mola office. She stated she did it and is waiting on an appointment with Dr. Benjamine Mola office.

## 2022-09-11 ENCOUNTER — Telehealth: Payer: Self-pay

## 2022-09-11 NOTE — Telephone Encounter (Signed)
Called Dr. Fabio Neighbors office about the medical records being sent to Dr. Benjamine Mola office, spoke with Lattie Haw in medical records she stated she has not sent the record over because the release of information that was sent to her she couldn't read. She stated Tammy Garrison would need to come in and sign it in person to have the records sent over to Dr. Benjamine Mola. I stated to her that she would be in on Monday to sign. Called the patient and she agreed to go by on Monday to take care of getting the release signed.

## 2022-09-17 ENCOUNTER — Encounter (HOSPITAL_COMMUNITY): Payer: Self-pay

## 2022-09-17 ENCOUNTER — Encounter: Payer: Self-pay | Admitting: Nurse Practitioner

## 2022-09-17 ENCOUNTER — Emergency Department (HOSPITAL_COMMUNITY): Payer: Medicaid Other

## 2022-09-17 ENCOUNTER — Telehealth: Payer: Self-pay

## 2022-09-17 ENCOUNTER — Inpatient Hospital Stay (HOSPITAL_COMMUNITY)
Admission: EM | Admit: 2022-09-17 | Discharge: 2022-09-20 | DRG: 547 | Disposition: A | Discharge status: Home / Self Care | Payer: Medicaid Other | Source: Ambulatory Visit | Attending: Internal Medicine | Admitting: Internal Medicine

## 2022-09-17 ENCOUNTER — Inpatient Hospital Stay: Payer: Medicaid Other | Attending: Hematology | Admitting: Nurse Practitioner

## 2022-09-17 DIAGNOSIS — R530 Neoplastic (malignant) related fatigue: Secondary | ICD-10-CM

## 2022-09-17 DIAGNOSIS — M2550 Pain in unspecified joint: Secondary | ICD-10-CM | POA: Diagnosis present

## 2022-09-17 DIAGNOSIS — F319 Bipolar disorder, unspecified: Secondary | ICD-10-CM | POA: Diagnosis present

## 2022-09-17 DIAGNOSIS — E876 Hypokalemia: Secondary | ICD-10-CM | POA: Diagnosis present

## 2022-09-17 DIAGNOSIS — R630 Anorexia: Secondary | ICD-10-CM | POA: Diagnosis not present

## 2022-09-17 DIAGNOSIS — Z9221 Personal history of antineoplastic chemotherapy: Secondary | ICD-10-CM

## 2022-09-17 DIAGNOSIS — M25551 Pain in right hip: Secondary | ICD-10-CM

## 2022-09-17 DIAGNOSIS — C210 Malignant neoplasm of anus, unspecified: Secondary | ICD-10-CM

## 2022-09-17 DIAGNOSIS — M25511 Pain in right shoulder: Secondary | ICD-10-CM | POA: Diagnosis present

## 2022-09-17 DIAGNOSIS — K5900 Constipation, unspecified: Secondary | ICD-10-CM

## 2022-09-17 DIAGNOSIS — D638 Anemia in other chronic diseases classified elsewhere: Secondary | ICD-10-CM | POA: Diagnosis present

## 2022-09-17 DIAGNOSIS — R262 Difficulty in walking, not elsewhere classified: Secondary | ICD-10-CM | POA: Diagnosis present

## 2022-09-17 DIAGNOSIS — D72829 Elevated white blood cell count, unspecified: Secondary | ICD-10-CM | POA: Diagnosis not present

## 2022-09-17 DIAGNOSIS — M069 Rheumatoid arthritis, unspecified: Secondary | ICD-10-CM

## 2022-09-17 DIAGNOSIS — M25462 Effusion, left knee: Principal | ICD-10-CM

## 2022-09-17 DIAGNOSIS — Z515 Encounter for palliative care: Secondary | ICD-10-CM | POA: Diagnosis not present

## 2022-09-17 DIAGNOSIS — R7982 Elevated C-reactive protein (CRP): Secondary | ICD-10-CM | POA: Diagnosis present

## 2022-09-17 DIAGNOSIS — T380X5A Adverse effect of glucocorticoids and synthetic analogues, initial encounter: Secondary | ICD-10-CM | POA: Diagnosis not present

## 2022-09-17 DIAGNOSIS — M25461 Effusion, right knee: Secondary | ICD-10-CM | POA: Diagnosis present

## 2022-09-17 DIAGNOSIS — Z6834 Body mass index (BMI) 34.0-34.9, adult: Secondary | ICD-10-CM

## 2022-09-17 DIAGNOSIS — Z79899 Other long term (current) drug therapy: Secondary | ICD-10-CM

## 2022-09-17 DIAGNOSIS — E669 Obesity, unspecified: Secondary | ICD-10-CM | POA: Diagnosis present

## 2022-09-17 DIAGNOSIS — M130 Polyarthritis, unspecified: Secondary | ICD-10-CM | POA: Diagnosis present

## 2022-09-17 DIAGNOSIS — R32 Unspecified urinary incontinence: Secondary | ICD-10-CM | POA: Diagnosis present

## 2022-09-17 DIAGNOSIS — D75839 Thrombocytosis, unspecified: Secondary | ICD-10-CM | POA: Diagnosis present

## 2022-09-17 DIAGNOSIS — Z923 Personal history of irradiation: Secondary | ICD-10-CM

## 2022-09-17 DIAGNOSIS — Z87891 Personal history of nicotine dependence: Secondary | ICD-10-CM

## 2022-09-17 DIAGNOSIS — M2540 Effusion, unspecified joint: Secondary | ICD-10-CM | POA: Diagnosis present

## 2022-09-17 DIAGNOSIS — Z85048 Personal history of other malignant neoplasm of rectum, rectosigmoid junction, and anus: Secondary | ICD-10-CM

## 2022-09-17 DIAGNOSIS — M549 Dorsalgia, unspecified: Secondary | ICD-10-CM

## 2022-09-17 DIAGNOSIS — R531 Weakness: Secondary | ICD-10-CM | POA: Diagnosis present

## 2022-09-17 DIAGNOSIS — Z91018 Allergy to other foods: Secondary | ICD-10-CM

## 2022-09-17 LAB — CBC WITH DIFFERENTIAL/PLATELET
Abs Immature Granulocytes: 0.03 10*3/uL (ref 0.00–0.07)
Basophils Absolute: 0 10*3/uL (ref 0.0–0.1)
Basophils Relative: 0 %
Eosinophils Absolute: 0.2 10*3/uL (ref 0.0–0.5)
Eosinophils Relative: 4 %
HCT: 32.4 % — ABNORMAL LOW (ref 36.0–46.0)
Hemoglobin: 10.1 g/dL — ABNORMAL LOW (ref 12.0–15.0)
Immature Granulocytes: 1 %
Lymphocytes Relative: 14 %
Lymphs Abs: 0.8 10*3/uL (ref 0.7–4.0)
MCH: 27.7 pg (ref 26.0–34.0)
MCHC: 31.2 g/dL (ref 30.0–36.0)
MCV: 88.8 fL (ref 80.0–100.0)
Monocytes Absolute: 0.6 10*3/uL (ref 0.1–1.0)
Monocytes Relative: 10 %
Neutro Abs: 4.2 10*3/uL (ref 1.7–7.7)
Neutrophils Relative %: 71 %
Platelets: 517 10*3/uL — ABNORMAL HIGH (ref 150–400)
RBC: 3.65 MIL/uL — ABNORMAL LOW (ref 3.87–5.11)
RDW: 14.5 % (ref 11.5–15.5)
WBC: 5.9 10*3/uL (ref 4.0–10.5)
nRBC: 0 % (ref 0.0–0.2)

## 2022-09-17 LAB — COMPREHENSIVE METABOLIC PANEL
ALT: 22 U/L (ref 0–44)
AST: 23 U/L (ref 15–41)
Albumin: 3.1 g/dL — ABNORMAL LOW (ref 3.5–5.0)
Alkaline Phosphatase: 99 U/L (ref 38–126)
Anion gap: 11 (ref 5–15)
BUN: 12 mg/dL (ref 6–20)
CO2: 26 mmol/L (ref 22–32)
Calcium: 9.3 mg/dL (ref 8.9–10.3)
Chloride: 99 mmol/L (ref 98–111)
Creatinine, Ser: 0.72 mg/dL (ref 0.44–1.00)
GFR, Estimated: >60 mL/min (ref >60)
Glucose, Bld: 99 mg/dL (ref 70–99)
Potassium: 3.6 mmol/L (ref 3.5–5.1)
Sodium: 136 mmol/L (ref 135–145)
Total Bilirubin: 0.4 mg/dL (ref 0.3–1.2)
Total Protein: 8.6 g/dL — ABNORMAL HIGH (ref 6.5–8.1)

## 2022-09-17 LAB — I-STAT CHEM 8, ED
BUN: 12 mg/dL (ref 6–20)
Calcium, Ion: 1.22 mmol/L (ref 1.15–1.40)
Chloride: 102 mmol/L (ref 98–111)
Creatinine, Ser: 0.6 mg/dL (ref 0.44–1.00)
Glucose, Bld: 96 mg/dL (ref 70–99)
HCT: 33 % — ABNORMAL LOW (ref 36.0–46.0)
Hemoglobin: 11.2 g/dL — ABNORMAL LOW (ref 12.0–15.0)
Potassium: 3.7 mmol/L (ref 3.5–5.1)
Sodium: 139 mmol/L (ref 135–145)
TCO2: 26 mmol/L (ref 22–32)

## 2022-09-17 LAB — I-STAT BETA HCG BLOOD, ED (MC, WL, AP ONLY): I-stat hCG, quantitative: <5 m[IU]/mL (ref <5)

## 2022-09-17 LAB — SEDIMENTATION RATE: Sed Rate: 123 mm/hr — ABNORMAL HIGH (ref 0–22)

## 2022-09-17 LAB — TSH: TSH: 1.174 u[IU]/mL (ref 0.350–4.500)

## 2022-09-17 MED ORDER — LIDOCAINE HCL 2 % IJ SOLN
10.0000 mL | Freq: Once | INTRAMUSCULAR | Status: AC
  Administered 2022-09-17: 200 mg
  Filled 2022-09-17: qty 20

## 2022-09-17 MED ORDER — SODIUM CHLORIDE 0.9 % IV BOLUS
1000.0000 mL | Freq: Once | INTRAVENOUS | Status: AC
  Administered 2022-09-17: 1000 mL via INTRAVENOUS

## 2022-09-17 MED ORDER — METHYLPREDNISOLONE SODIUM SUCC 125 MG IJ SOLR
125.0000 mg | Freq: Once | INTRAMUSCULAR | Status: AC
  Administered 2022-09-17: 125 mg via INTRAVENOUS
  Filled 2022-09-17: qty 2

## 2022-09-17 MED ORDER — IOHEXOL 300 MG/ML  SOLN
100.0000 mL | Freq: Once | INTRAMUSCULAR | Status: AC | PRN
  Administered 2022-09-17: 100 mL via INTRAVENOUS

## 2022-09-17 MED ORDER — GADOBUTROL 1 MMOL/ML IV SOLN
10.0000 mL | Freq: Once | INTRAVENOUS | Status: AC | PRN
  Administered 2022-09-17: 10 mL via INTRAVENOUS

## 2022-09-17 MED ORDER — HYDROMORPHONE HCL 1 MG/ML IJ SOLN
1.0000 mg | Freq: Once | INTRAMUSCULAR | Status: AC
  Administered 2022-09-17: 1 mg via INTRAVENOUS
  Filled 2022-09-17: qty 1

## 2022-09-17 MED ORDER — ONDANSETRON HCL 4 MG/2ML IJ SOLN: 4.0000 mg | Freq: Once | INTRAMUSCULAR | Status: AC

## 2022-09-17 MED ORDER — LORAZEPAM 2 MG/ML IJ SOLN
1.0000 mg | Freq: Once | INTRAMUSCULAR | Status: AC | PRN
  Administered 2022-09-17: 1 mg via INTRAVENOUS
  Filled 2022-09-17: qty 1

## 2022-09-17 MED ORDER — MORPHINE SULFATE (PF) 4 MG/ML IV SOLN
4.0000 mg | Freq: Once | INTRAVENOUS | Status: AC
  Administered 2022-09-17: 4 mg via INTRAVENOUS
  Filled 2022-09-17: qty 1

## 2022-09-17 MED ORDER — ONDANSETRON HCL 4 MG/2ML IJ SOLN
INTRAMUSCULAR | Status: AC
  Filled 2022-09-17: qty 2

## 2022-09-17 NOTE — ED Notes (Signed)
Patient transported to MRI 

## 2022-09-17 NOTE — Telephone Encounter (Signed)
Called Tammy Garrison in medical records at Dr. Sabino Niemann office to see if the medical records had been faxed over to Dr. Benjamine Mola office. She stated that they were sent today.

## 2022-09-17 NOTE — ED Triage Notes (Signed)
Patient arrived with complaints of generalized joint swelling over the last few weeks. Hx of RA

## 2022-09-17 NOTE — Progress Notes (Signed)
[name]  [phone] [phone]   Name: Tammy Garrison Date: 09/17/2022 MRN: [id number]  DOB: 1990/09/30  Patient Care Team: [place] as PCP - General (Internal Medicine) [name], MD (Rheumatology) Tyson Dense, MD as Consulting Physician (Obstetrics and Gynecology) Truitt Merle, MD as Consulting Physician (Hematology and Oncology)     I connected with Tammy Garrison on 09/17/22 at 11:30 AM EST by phone and verified that I am speaking with the correct person using two identifiers.   I discussed the limitations, risks, security and privacy concerns of performing an evaluation and management service by telemedicine and the availability of in-person appointments. I also discussed with the patient that there may be a patient responsible charge related to this service. The patient expressed understanding and agreed to proceed.   Other persons participating in the visit and their role in the encounter: [place], RN   Patient's location: home  Provider's location: [facility]   Chief Complaint: follow up of symptom management    INTERVAL HISTORY: Tammy Garrison is a 32 y.o. 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 Palliative ask to see for symptom management.   SOCIAL HISTORY:     reports that she quit smoking about 3 years ago. Her smoking use included cigarettes. She has a 1.75 pack-year smoking history. She has never used smokeless tobacco. She reports that she does not currently use alcohol. She reports that she does not currently use drugs after having used the following drugs: Marijuana.  ADVANCE DIRECTIVES:    CODE STATUS:   PAST MEDICAL HISTORY: Past Medical History:  Diagnosis Date   Anemia    Bipolar 1 disorder ([place])    Cancer ([place])    squamous cell rectal cancer   ETOH abuse    H/O self-harm     H/O suicide attempt    x 5 - last 05/2016 - overdose Ibuprofen   Marijuana abuse    Rheumatoid arthritis ([facility])     ALLERGIES:  is allergic to coconut flavor.  MEDICATIONS:  Current Outpatient Medications  Medication Sig Dispense Refill   diphenhydrAMINE HCl, Sleep, ([employer] SLEEP-AID NIGHTTIME) 50 MG CAPS Take 100-150 mg by mouth at bedtime.     docusate sodium (COLACE) 100 MG capsule Take 200 mg by mouth daily.     gabapentin (NEURONTIN) 300 MG capsule Take 1 capsule (300 mg total) by mouth 3 (three) times daily. 90 capsule 0   Histamine Dihydrochloride (AUSTRALIAN DREAM ARTHRITIS EX) Apply 1 Application topically 3 (three) times daily as needed (pain.).     ibuprofen (ADVIL) 200 MG tablet Take 800 mg by mouth every 8 (eight) hours as needed (pain.).     lidocaine (REGENECARE HA) 2 % jelly Apply 1 Application topically daily. (Patient not taking: Reported on 07/09/2022) 85 g 0   LORazepam (ATIVAN) 0.5 MG tablet Take 1 tablet (0.5 mg total) by mouth every 6 (six) hours as needed (nausea). (Patient not taking: Reported on 07/09/2022) 20 tablet 0   megestrol (MEGACE) 40 MG tablet Take 1 tablet (40 mg total) by mouth 2 (two) times daily. (Patient not taking: Reported on 05/19/2022) 28 tablet 0   Menthol, Topical Analgesic, (BIOFREEZE EX) Apply 1 Application topically 3 (three) times daily as needed (pain.).     mirtazapine (REMERON) 15 MG tablet Take 1 tablet (15 mg total) by mouth at bedtime. 30 tablet 0   polyethylene glycol (MIRALAX /  GLYCOLAX) 17 g packet Take 17 g by mouth daily.     prochlorperazine (COMPAZINE) 10 MG tablet Take 1 tablet (10 mg total) by mouth every 6 (six) hours as needed for nausea or vomiting. 30 tablet 1   Pseudoeph-Doxylamine-DM-APAP (NYQUIL PO) Take 15-30 mLs by mouth at bedtime as needed (cold symptoms (hold sleep-aid when using)).     traMADol-acetaminophen (ULTRACET) 37.5-325 MG tablet Take 1 tablet by mouth every 6 hours as needed. 60 tablet 0   No current  facility-administered medications for this visit.   Facility-Administered Medications Ordered in Other Visits  Medication Dose Route Frequency Provider Last Rate Last Admin   0.9 %  sodium chloride infusion (Manually program via Guardrails IV Fluids)  250 mL Intravenous Once Truitt Merle, MD       heparin lock flush 100 unit/mL  250 Units Intracatheter Once Truitt Merle, MD       sodium chloride flush (NS) 0.9 % injection 10 mL  10 mL Intracatheter Once Truitt Merle, MD       sodium chloride flush (NS) 0.9 % injection 10 mL  10 mL Intracatheter Once Truitt Merle, MD       sodium chloride flush (NS) 0.9 % injection 10 mL  10 mL Intracatheter Once Truitt Merle, MD        VITAL SIGNS: There were no vitals taken for this visit. There were no vitals filed for this visit.  Estimated body mass index is 34.56 kg/m as calculated from the following:   Height as of 08/20/22: 6' (1.829 m).   Weight as of 08/20/22: 254 lb 12.8 oz (115.6 kg).   PERFORMANCE STATUS (ECOG) : 1 - Symptomatic but completely ambulatory   IMPRESSION: I connected with Tammy Garrison via phone. States she has been in the bed for the past 24 hours due to significant hip pain. States it "just locked up" on her. Shares concerns in her decreased ability to ambulate or care for herself due to this. She has been referred to Dr. Benjamine Mola (Rheumatologist) as the bulk of her pain is related to her significant RA. She confirms going by their office on Monday and completing release of info and now waiting for appointment to be scheduled.   Appetite has been minimal which she relates to ongoing feelings of fatigue and discomfort. Feels she has loss some weight. She is taking mirtazapine at bedtime. This has helped her with insomnia and appetite however not so much lately. Complains of some ongoing constipation. She is going regularly but still not soft causing occasional bleeding. Recommended taking Senna-S 2 tablets daily in addition to her Miralax for additional  support.   We discussed her hip pain at length. She is taking Ultracet as needed in addition to gabapentin 321m three times daily. Recommended increasing night dose of gabapentin to 6033m She verbalized understanding. Unfortunately due to Tammy Garrison's significant inability to be active and ambulate she is concerned sharing she may seek further work-up at ED. We will follow-up for additional appointments as well. No changes in her pain regimen as the bulk of her pain is related to her RA versus cancer.   PLAN: Ultracet every 6 hours as needed Mirtazapine 15 mg mg at bedtime Gabapentin 300 mg twice daily and 600 mg at bedtime Compazine as needed for nausea  We will continue to closely monitor and assist with symptom management as needed.  I will plan to see patient back in 3-4 weeks in collaboration to other oncology appointments. We will plan  for follow-up call on Friday to assess pain.   Patient expressed understanding and was in agreement with this plan. She also understands that She can call the clinic at any time with any questions, concerns, or complaints.      Any controlled substances utilized were prescribed in the context of palliative care. PDMP has been reviewed.    Time Total: 30 min   Visit consisted of counseling and education dealing with the complex and emotionally intense issues of symptom management and palliative care in the setting of serious and potentially life-threatening illness.Greater than 50%  of this time was spent counseling and coordinating care related to the above assessment and plan.  Alda Lea, AGPCNP-BC  Palliative Medicine Team/[place]

## 2022-09-17 NOTE — ED Provider Notes (Signed)
[name] Provider Note   CSN: [id number] Arrival date & time: 09/17/22  2011     History  Chief Complaint  Patient presents with   Joint Swelling    Tammy Garrison is a 32 y.o. female history of rheumatoid arthritis, previous squamous cell carcinoma in remission here presenting with back pain, incontinence, right knee pain and right hip pain.  Patient states that she has been having pain for a whole month.  She states that the pain is mainly in her back and her right knee and her right hip.  Patient is on chronic Ultracet for pain as well as gabapentin.  She was referred to oncology and saw them today and had a refill on her medicine.  Since she is in significant pain, she is also sent into the ER for further evaluation.  Patient states that she has occasional episodes of incontinence.  She also states that she walks with a cane at baseline has trouble walking.  Patient has no known mets to her lumbar spine.  The history is provided by the patient.       Home Medications Prior to Admission medications   Medication Sig Start Date End Date Taking? Authorizing Provider  diphenhydrAMINE HCl, Sleep, ([employer] SLEEP-AID NIGHTTIME) 50 MG CAPS Take 100-150 mg by mouth at bedtime.    [provider]  docusate sodium (COLACE) 100 MG capsule Take 200 mg by mouth daily.    [provider]  gabapentin (NEURONTIN) 300 MG capsule Take 1 capsule (300 mg total) by mouth 3 (three) times daily. 07/31/22   Pickenpack-Cousar, Carlena Sax, NP  Histamine Dihydrochloride (AUSTRALIAN DREAM ARTHRITIS EX) Apply 1 Application topically 3 (three) times daily as needed (pain.).    [provider]  ibuprofen (ADVIL) 200 MG tablet Take 800 mg by mouth every 8 (eight) hours as needed (pain.).    [provider]  lidocaine (REGENECARE HA) 2 % jelly Apply 1 Application topically daily. Patient not taking: Reported on 07/09/2022 04/25/22    Edwin Dada, MD  LORazepam (ATIVAN) 0.5 MG tablet Take 1 tablet (0.5 mg total) by mouth every 6 (six) hours as needed (nausea). Patient not taking: Reported on 07/09/2022 04/10/22   Truitt Merle, MD  megestrol (MEGACE) 40 MG tablet Take 1 tablet (40 mg total) by mouth 2 (two) times daily. Patient not taking: Reported on 05/19/2022 04/01/22   Marcello Fennel, PA-C  Menthol, Topical Analgesic, (BIOFREEZE EX) Apply 1 Application topically 3 (three) times daily as needed (pain.).    [provider]  mirtazapine (REMERON) 15 MG tablet Take 1 tablet (15 mg total) by mouth at bedtime. 08/20/22   Pickenpack-Cousar, Carlena Sax, NP  polyethylene glycol (MIRALAX / GLYCOLAX) 17 g packet Take 17 g by mouth daily.    [provider]  prochlorperazine (COMPAZINE) 10 MG tablet Take 1 tablet (10 mg total) by mouth every 6 (six) hours as needed for nausea or vomiting. 07/22/22   Pickenpack-Cousar, Carlena Sax, NP  Pseudoeph-Doxylamine-DM-APAP (NYQUIL PO) Take 15-30 mLs by mouth at bedtime as needed (cold symptoms (hold sleep-aid when using)).    [provider]  traMADol-acetaminophen (ULTRACET) 37.5-325 MG tablet Take 1 tablet by mouth every 6 hours as needed. 08/20/22   Pickenpack-Cousar, Carlena Sax, NP      Allergies    Coconut flavor    Review of Systems   Review of Systems  Musculoskeletal:  Positive for back pain.  All other systems reviewed  and are negative.   Physical Exam Updated Vital Signs BP (!) 147/110   Pulse (!) 113   Temp 98.3 F (36.8 C) (Oral)   Resp 18   SpO2 100%  Physical Exam Vitals and nursing note reviewed.  Constitutional:      Comments: Uncomfortable  HENT:     Head: Normocephalic.     Nose: Nose normal.     Mouth/Throat:     Mouth: Mucous membranes are moist.  Eyes:     Extraocular Movements: Extraocular movements intact.     Pupils: Pupils are equal, round, and reactive to light.  Cardiovascular:     Rate and Rhythm: Normal rate and  regular rhythm.     Pulses: Normal pulses.     Heart sounds: Normal heart sounds.  Pulmonary:     Effort: Pulmonary effort is normal.     Breath sounds: Normal breath sounds.  Abdominal:     General: Abdomen is flat.     Palpations: Abdomen is soft.  Musculoskeletal:     Cervical back: Normal range of motion and neck supple.     Comments: Mild diffuse bilateral lumbar tenderness.  Positive straight leg raise on the right side.  Patient also has right knee effusion.  No obvious septic joint  Skin:    General: Skin is warm.     Capillary Refill: Capillary refill takes less than 2 seconds.  Neurological:     General: No focal deficit present.     Mental Status: She is oriented to person, place, and time.  Psychiatric:        Mood and Affect: Mood normal.        Behavior: Behavior normal.     ED Results / Procedures / Treatments   Labs (all labs ordered are listed, but only abnormal results are displayed) Labs Reviewed  CBC WITH DIFFERENTIAL/PLATELET  COMPREHENSIVE METABOLIC PANEL  SEDIMENTATION RATE  C-REACTIVE PROTEIN  TSH  I-STAT BETA HCG BLOOD, ED ([facility], [facility], [facility] ONLY)  I-STAT CHEM 8, ED    EKG None  Radiology No results found.  Procedures .Joint Aspiration/Arthrocentesis  Date/Time: 09/17/2022 11:41 PM  Performed by: Drenda Freeze, MD Authorized by: Drenda Freeze, MD   Consent:    Consent obtained:  Verbal   Consent given by:  Patient   Risks, benefits, and alternatives were discussed: yes     Risks discussed:  Infection and bleeding   Alternatives discussed:  No treatment Universal protocol:    Procedure explained and questions answered to patient or proxy's satisfaction: yes     Relevant documents present and verified: yes     Test results available: yes     Imaging studies available: yes     Patient identity confirmed:  Verbally with patient Location:    Location:  Knee   Knee:  L knee Anesthesia:    Anesthesia method:  Local infiltration    Local anesthetic:  Lidocaine 2% w/o epi Procedure details:    Preparation: Patient was prepped and draped in usual sterile fashion     Needle gauge:  18 G   Ultrasound guidance: yes     Approach:  Lateral   Aspirate amount:  20   Aspirate characteristics:  Serous and bloody   Steroid injected: no     Specimen collected: yes   Post-procedure details:    Dressing:  Adhesive bandage   Procedure completion:  Tolerated     EMERGENCY DEPARTMENT US SOFT TISSUE INTERPRETATION "Study: Limited Soft Tissue  Ultrasound"  INDICATIONS: Pain Multiple views of the body part were obtained in real-time with a multi-frequency linear probe  PERFORMED BY: Myself IMAGES ARCHIVED?: Yes SIDE:Right  BODY PART: R knee INTERPRETATION:   There is obvious knee effusion   CRITICAL CARE Performed by: Wandra Arthurs   Total critical care time: 34 minutes  Critical care time was exclusive of separately billable procedures and treating other patients.  Critical care was necessary to treat or prevent imminent or life-threatening deterioration.  Critical care was time spent personally by me on the following activities: development of treatment plan with patient and/or surrogate as well as nursing, discussions with consultants, evaluation of patient's response to treatment, examination of patient, obtaining history from patient or surrogate, ordering and performing treatments and interventions, ordering and review of laboratory studies, ordering and review of radiographic studies, pulse oximetry and re-evaluation of patient's condition.   Medications Ordered in ED Medications  morphine (PF) 4 MG/ML injection 4 mg (has no administration in time range)  sodium chloride 0.9 % bolus 1,000 mL (has no administration in time range)  methylPREDNISolone sodium succinate (SOLU-MEDROL) 125 mg/2 mL injection 125 mg (has no administration in time range)  LORazepam (ATIVAN) injection 1 mg (has no administration in time  range)    ED Course/ Medical Decision Making/ A&P                             Medical Decision Making Tammy Garrison is a 32 y.o. female here with back pain and incontinence and right knee pain.  Patient has history of rheumatoid arthritis.  Since she has some incontinence and concern for mets to the lumbar spine versus lumbar radiculopathy. Will get ESR, CRP as well.   11:39 PM Patient has bilateral knee effusion on x-rays.  I was able to ultrasound both knees and there is more effusion on the left compared to the right.  Patient's ESR is 123.  I think patient likely has rheumatoid arthritis causing her pain.  However given bilateral knee swelling, I need to rule out a septic joint.  I was able to perform arthrocentesis and about 20 cc of serosanguineous and slightly blood-tinged effusion came out.  MRI is pending.  Anticipate that patient will need admission regardless of MRI results.  Patient may need Ortho consult if patient has septic joint.  I held antibiotics for now. Signed out to Dr. Randal Buba in the ED.   Amount and/or Complexity of Data Reviewed Labs: ordered. Radiology: ordered.  Risk Prescription drug management.   Final Clinical Impression(s) / ED Diagnoses Final diagnoses:  None    Rx / DC Orders ED Discharge Orders     None         Drenda Freeze, MD 09/17/22 2344

## 2022-09-18 ENCOUNTER — Other Ambulatory Visit: Payer: Self-pay

## 2022-09-18 DIAGNOSIS — M25511 Pain in right shoulder: Secondary | ICD-10-CM | POA: Diagnosis present

## 2022-09-18 DIAGNOSIS — R52 Pain, unspecified: Secondary | ICD-10-CM | POA: Diagnosis present

## 2022-09-18 DIAGNOSIS — D638 Anemia in other chronic diseases classified elsewhere: Secondary | ICD-10-CM | POA: Diagnosis present

## 2022-09-18 DIAGNOSIS — D72829 Elevated white blood cell count, unspecified: Secondary | ICD-10-CM | POA: Diagnosis not present

## 2022-09-18 DIAGNOSIS — M130 Polyarthritis, unspecified: Secondary | ICD-10-CM | POA: Diagnosis present

## 2022-09-18 DIAGNOSIS — Z9221 Personal history of antineoplastic chemotherapy: Secondary | ICD-10-CM | POA: Diagnosis not present

## 2022-09-18 DIAGNOSIS — R32 Unspecified urinary incontinence: Secondary | ICD-10-CM | POA: Diagnosis present

## 2022-09-18 DIAGNOSIS — F319 Bipolar disorder, unspecified: Secondary | ICD-10-CM | POA: Diagnosis present

## 2022-09-18 DIAGNOSIS — M25462 Effusion, left knee: Secondary | ICD-10-CM | POA: Diagnosis present

## 2022-09-18 DIAGNOSIS — M069 Rheumatoid arthritis, unspecified: Secondary | ICD-10-CM | POA: Diagnosis present

## 2022-09-18 DIAGNOSIS — M2540 Effusion, unspecified joint: Secondary | ICD-10-CM | POA: Diagnosis present

## 2022-09-18 DIAGNOSIS — R262 Difficulty in walking, not elsewhere classified: Secondary | ICD-10-CM | POA: Diagnosis present

## 2022-09-18 DIAGNOSIS — R7982 Elevated C-reactive protein (CRP): Secondary | ICD-10-CM | POA: Diagnosis present

## 2022-09-18 DIAGNOSIS — Z6834 Body mass index (BMI) 34.0-34.9, adult: Secondary | ICD-10-CM | POA: Diagnosis not present

## 2022-09-18 DIAGNOSIS — R531 Weakness: Secondary | ICD-10-CM | POA: Diagnosis present

## 2022-09-18 DIAGNOSIS — Z79899 Other long term (current) drug therapy: Secondary | ICD-10-CM | POA: Diagnosis not present

## 2022-09-18 DIAGNOSIS — Z923 Personal history of irradiation: Secondary | ICD-10-CM | POA: Diagnosis not present

## 2022-09-18 DIAGNOSIS — T380X5A Adverse effect of glucocorticoids and synthetic analogues, initial encounter: Secondary | ICD-10-CM | POA: Diagnosis not present

## 2022-09-18 DIAGNOSIS — Z91018 Allergy to other foods: Secondary | ICD-10-CM | POA: Diagnosis not present

## 2022-09-18 DIAGNOSIS — Z87891 Personal history of nicotine dependence: Secondary | ICD-10-CM | POA: Diagnosis not present

## 2022-09-18 DIAGNOSIS — M25461 Effusion, right knee: Secondary | ICD-10-CM | POA: Diagnosis present

## 2022-09-18 DIAGNOSIS — E669 Obesity, unspecified: Secondary | ICD-10-CM | POA: Diagnosis present

## 2022-09-18 DIAGNOSIS — D75839 Thrombocytosis, unspecified: Secondary | ICD-10-CM | POA: Diagnosis present

## 2022-09-18 DIAGNOSIS — M2550 Pain in unspecified joint: Secondary | ICD-10-CM | POA: Diagnosis present

## 2022-09-18 DIAGNOSIS — E876 Hypokalemia: Secondary | ICD-10-CM | POA: Diagnosis present

## 2022-09-18 DIAGNOSIS — Z85048 Personal history of other malignant neoplasm of rectum, rectosigmoid junction, and anus: Secondary | ICD-10-CM | POA: Diagnosis not present

## 2022-09-18 LAB — COMPREHENSIVE METABOLIC PANEL
ALT: 21 U/L (ref 0–44)
AST: 21 U/L (ref 15–41)
Albumin: 3.1 g/dL — ABNORMAL LOW (ref 3.5–5.0)
Alkaline Phosphatase: 90 U/L (ref 38–126)
Anion gap: 11 (ref 5–15)
BUN: 11 mg/dL (ref 6–20)
CO2: 24 mmol/L (ref 22–32)
Calcium: 9.2 mg/dL (ref 8.9–10.3)
Chloride: 99 mmol/L (ref 98–111)
Creatinine, Ser: 0.66 mg/dL (ref 0.44–1.00)
GFR, Estimated: >60 mL/min (ref >60)
Glucose, Bld: 211 mg/dL — ABNORMAL HIGH (ref 70–99)
Potassium: 3.9 mmol/L (ref 3.5–5.1)
Sodium: 134 mmol/L — ABNORMAL LOW (ref 135–145)
Total Bilirubin: 0.5 mg/dL (ref 0.3–1.2)
Total Protein: 8.5 g/dL — ABNORMAL HIGH (ref 6.5–8.1)

## 2022-09-18 LAB — SYNOVIAL CELL COUNT + DIFF, W/ CRYSTALS
Crystals, Fluid: NONE SEEN
Eosinophils-Synovial: 1 % (ref 0–1)
Lymphocytes-Synovial Fld: 5 % (ref 0–20)
Monocyte-Macrophage-Synovial Fluid: 8 % — ABNORMAL LOW (ref 50–90)
Neutrophil, Synovial: 86 % — ABNORMAL HIGH (ref 0–25)
WBC, Synovial: 12140 /mm3 — ABNORMAL HIGH (ref 0–200)

## 2022-09-18 LAB — PHOSPHORUS: Phosphorus: 3.6 mg/dL (ref 2.5–4.6)

## 2022-09-18 LAB — CBC WITH DIFFERENTIAL/PLATELET
Abs Immature Granulocytes: 0.03 10*3/uL (ref 0.00–0.07)
Basophils Absolute: 0 10*3/uL (ref 0.0–0.1)
Basophils Relative: 0 %
Eosinophils Absolute: 0 10*3/uL (ref 0.0–0.5)
Eosinophils Relative: 0 %
HCT: 31.8 % — ABNORMAL LOW (ref 36.0–46.0)
Hemoglobin: 9.9 g/dL — ABNORMAL LOW (ref 12.0–15.0)
Immature Granulocytes: 0 %
Lymphocytes Relative: 5 %
Lymphs Abs: 0.4 10*3/uL — ABNORMAL LOW (ref 0.7–4.0)
MCH: 27.7 pg (ref 26.0–34.0)
MCHC: 31.1 g/dL (ref 30.0–36.0)
MCV: 89.1 fL (ref 80.0–100.0)
Monocytes Absolute: 0 10*3/uL — ABNORMAL LOW (ref 0.1–1.0)
Monocytes Relative: 1 %
Neutro Abs: 6.8 10*3/uL (ref 1.7–7.7)
Neutrophils Relative %: 94 %
Platelets: 472 10*3/uL — ABNORMAL HIGH (ref 150–400)
RBC: 3.57 MIL/uL — ABNORMAL LOW (ref 3.87–5.11)
RDW: 14.3 % (ref 11.5–15.5)
WBC: 7.2 10*3/uL (ref 4.0–10.5)
nRBC: 0 % (ref 0.0–0.2)

## 2022-09-18 LAB — C-REACTIVE PROTEIN: CRP: 18.2 mg/dL — ABNORMAL HIGH (ref <1.0)

## 2022-09-18 LAB — MAGNESIUM: Magnesium: 2.2 mg/dL (ref 1.7–2.4)

## 2022-09-18 MED ORDER — MIRTAZAPINE 15 MG PO TABS
15.0000 mg | ORAL_TABLET | Freq: Every day | ORAL | Status: DC
  Administered 2022-09-18 – 2022-09-19 (×3): 15 mg via ORAL
  Filled 2022-09-18: qty 2
  Filled 2022-09-18 (×2): qty 1

## 2022-09-18 MED ORDER — MELATONIN 3 MG PO TABS
3.0000 mg | ORAL_TABLET | Freq: Every evening | ORAL | Status: DC | PRN
  Administered 2022-09-19: 3 mg via ORAL
  Filled 2022-09-18: qty 1

## 2022-09-18 MED ORDER — PREDNISONE 20 MG PO TABS
50.0000 mg | ORAL_TABLET | Freq: Every day | ORAL | Status: DC
  Administered 2022-09-18 – 2022-09-20 (×3): 50 mg via ORAL
  Filled 2022-09-18 (×3): qty 1

## 2022-09-18 MED ORDER — SENNOSIDES-DOCUSATE SODIUM 8.6-50 MG PO TABS
1.0000 | ORAL_TABLET | Freq: Every day | ORAL | Status: DC
  Administered 2022-09-18 – 2022-09-19 (×3): 1 via ORAL
  Filled 2022-09-18 (×3): qty 1

## 2022-09-18 MED ORDER — GABAPENTIN 300 MG PO CAPS
300.0000 mg | ORAL_CAPSULE | Freq: Three times a day (TID) | ORAL | Status: DC
  Administered 2022-09-18 – 2022-09-20 (×7): 300 mg via ORAL
  Filled 2022-09-18 (×7): qty 1

## 2022-09-18 MED ORDER — PROCHLORPERAZINE EDISYLATE 10 MG/2ML IJ SOLN: 5.0000 mg | Freq: Four times a day (QID) | INTRAMUSCULAR | Status: DC | PRN

## 2022-09-18 MED ORDER — ENOXAPARIN SODIUM 40 MG/0.4ML IJ SOSY
40.0000 mg | PREFILLED_SYRINGE | INTRAMUSCULAR | Status: DC
  Administered 2022-09-18 – 2022-09-19 (×2): 40 mg via SUBCUTANEOUS
  Filled 2022-09-18 (×3): qty 0.4

## 2022-09-18 MED ORDER — OXYCODONE HCL 5 MG PO TABS
5.0000 mg | ORAL_TABLET | Freq: Four times a day (QID) | ORAL | Status: AC | PRN
  Administered 2022-09-18 – 2022-09-19 (×3): 5 mg via ORAL
  Filled 2022-09-18 (×3): qty 1

## 2022-09-18 MED ORDER — POLYETHYLENE GLYCOL 3350 17 G PO PACK
17.0000 g | PACK | Freq: Every day | ORAL | Status: DC | PRN
  Administered 2022-09-19: 17 g via ORAL
  Filled 2022-09-18: qty 1

## 2022-09-18 MED ORDER — ACETAMINOPHEN 325 MG PO TABS
650.0000 mg | ORAL_TABLET | Freq: Four times a day (QID) | ORAL | Status: DC | PRN
  Administered 2022-09-19 (×3): 650 mg via ORAL
  Filled 2022-09-18 (×3): qty 2

## 2022-09-18 MED ORDER — HYDROMORPHONE HCL 1 MG/ML IJ SOLN
0.5000 mg | INTRAMUSCULAR | Status: AC | PRN
  Administered 2022-09-18 (×3): 0.5 mg via INTRAVENOUS
  Filled 2022-09-18: qty 0.5
  Filled 2022-09-18 (×2): qty 1

## 2022-09-18 NOTE — ED Notes (Signed)
ED TO INPATIENT HANDOFF REPORT  ED Nurse Name and Phone #: Alroy Bailiff Name/Age/Gender Tammy Garrison 32 y.o. female Room/Bed: WA01/WA01  Code Status   Code Status: Full Code  Home/SNF/Other Home Patient oriented to: self, place, time, and situation Is this baseline? Yes   Triage Complete: Triage complete  Chief Complaint Joint swelling [M25.40] Joint pain [M25.50]  Triage Note Patient arrived with complaints of generalized joint swelling over the last few weeks. Hx of RA    Allergies Allergies  Allergen Reactions   Coconut Flavor Itching and Rash    Itchy throat    Level of Care/Admitting Diagnosis ED Disposition     ED Disposition  Admit   Condition  --   Comment  Hospital Area: [facility] [[id number]]  Level of Care: Med-Surg [16]  May admit patient to [facility] or [facility] if equivalent level of care is available:: No  Covid Evaluation: Confirmed COVID Negative  Diagnosis: Joint pain [186011]  Admitting Physician: Shelly Coss [id number]  Attending Physician: Shelly Coss [id number]  Certification:: I certify this patient will need inpatient services for at least 2 midnights  Estimated Length of Stay: 2          B Medical/Surgery History Past Medical History:  Diagnosis Date   Anemia    Bipolar 1 disorder ([place])    Cancer ([place])    squamous cell rectal cancer   ETOH abuse    H/O self-harm    H/O suicide attempt    x 5 - last 05/2016 - overdose Ibuprofen   Marijuana abuse    Rheumatoid arthritis ([place])    Past Surgical History:  Procedure Laterality Date   HEMORRHOID SURGERY N/A 02/08/2022   Procedure: EXTERNAL AND INTERNAL HEMORRHOIDECTOMY;  Surgeon: Georganna Skeans, MD;  Location: [facility];  Service: General;  Laterality: N/A;   INCISION AND DRAINAGE ABSCESS Right 04/22/2022   Procedure: INCISION AND DRAINAGE axilla;  Surgeon: Kieth Brightly, Arta Bruce, MD;  Location[facility];  Service: General;  Laterality: Right;    RECTAL EXAM UNDER ANESTHESIA N/A 07/21/2022   Procedure: RECTAL EXAM UNDER ANESTHESIA;  Surgeon: Ileana Roup, MD;  Location: [facility];  Service: General;  Laterality: N/A;   TRANSANAL HEMORRHOIDAL DEARTERIALIZATION N/A 07/21/2022   Procedure: ANAL CANAL BIOPSY;  Surgeon: Ileana Roup, MD;  Location: [facility];  Service: General;  Laterality: N/A;     A IV Location/Drains/Wounds Patient Lines/Drains/Airways Status     Active Line/Drains/Airways     Name Placement date Placement time Site Days   Peripheral IV 09/17/22 20 G 1" Left Antecubital 09/17/22  2110  Antecubital  1   External Urinary Catheter 09/18/22  0138  --  less than 1   Incision (Closed) 02/08/22 Rectum Other (Comment) 02/08/22  0835  -- 222   Incision (Closed) 04/22/22 Axilla 04/22/22  1031  -- 149   Incision (Closed) 07/21/22 Rectum Other (Comment) 07/21/22  1029  -- 59            Intake/Output Last 24 hours  Intake/Output Summary (Last 24 hours) at 09/18/2022 1508 Last data filed at 09/17/2022 2209 Gross per 24 hour  Intake 1000 ml  Output --  Net 1000 ml    Labs/Imaging Results for orders placed or performed during the hospital encounter of 09/17/22 (from the past 48 hour(s))  CBC with Differential     Status: Abnormal   Collection Time: 09/17/22  9:13 PM  Result Value Ref Range   WBC 5.9  4.0 - 10.5 K/uL   RBC 3.65 (L) 3.87 - 5.11 MIL/uL   Hemoglobin 10.1 (L) 12.0 - 15.0 g/dL   HCT 32.4 (L) 36.0 - 46.0 %   MCV 88.8 80.0 - 100.0 fL   MCH 27.7 26.0 - 34.0 pg   MCHC 31.2 30.0 - 36.0 g/dL   RDW 14.5 11.5 - 15.5 %   Platelets 517 (H) 150 - 400 K/uL   nRBC 0.0 0.0 - 0.2 %   Neutrophils Relative % 71 %   Neutro Abs 4.2 1.7 - 7.7 K/uL   Lymphocytes Relative 14 %   Lymphs Abs 0.8 0.7 - 4.0 K/uL   Monocytes Relative 10 %   Monocytes Absolute 0.6 0.1 - 1.0 K/uL   Eosinophils Relative 4 %   Eosinophils Absolute 0.2 0.0 - 0.5 K/uL   Basophils Relative 0 %   Basophils Absolute 0.0 0.0 - 0.1 K/uL    Immature Granulocytes 1 %   Abs Immature Granulocytes 0.03 0.00 - 0.07 K/uL    Comment: Performed at [facility], [place] [address] [place], [place] [address]  Comprehensive metabolic panel     Status: Abnormal   Collection Time: 09/17/22  9:13 PM  Result Value Ref Range   Sodium 136 135 - 145 mmol/L   Potassium 3.6 3.5 - 5.1 mmol/L   Chloride 99 98 - 111 mmol/L   CO2 26 22 - 32 mmol/L   Glucose, Bld 99 70 - 99 mg/dL    Comment: Glucose reference range applies only to samples taken after fasting for at least 8 hours.   BUN 12 6 - 20 mg/dL   Creatinine, Ser 0.72 0.44 - 1.00 mg/dL   Calcium 9.3 8.9 - 10.3 mg/dL   Total Protein 8.6 (H) 6.5 - 8.1 g/dL   Albumin 3.1 (L) 3.5 - 5.0 g/dL   AST 23 15 - 41 U/L   ALT 22 0 - 44 U/L   Alkaline Phosphatase 99 38 - 126 U/L   Total Bilirubin 0.4 0.3 - 1.2 mg/dL   GFR, Estimated >60 >60 mL/min    Comment: (NOTE) Calculated using the CKD-EPI Creatinine Equation (2021)    Anion gap 11 5 - 15    Comment: Performed at [facility], [place] [address], [place], [place] [address]  Sedimentation rate     Status: Abnormal   Collection Time: 09/17/22  9:13 PM  Result Value Ref Range   Sed Rate 123 (H) 0 - 22 mm/hr    Comment: Performed at [facility], [place] [address] [place], [place] [address]  C-reactive protein     Status: Abnormal   Collection Time: 09/17/22  9:13 PM  Result Value Ref Range   CRP 18.2 (H) <1.0 mg/dL    Comment: Performed at [facility] [address] [place], [place] [address]  TSH     Status: None   Collection Time: 09/17/22  9:13 PM  Result Value Ref Range   TSH 1.174 0.350 - 4.500 uIU/mL    Comment: Performed by a 3rd Generation assay with a functional sensitivity of <=0.01 uIU/mL. Performed at [facility], [place] [address], [place], [place] [address]   I-Stat Beta hCG blood, ED ([facility], [facility], [facility] only)     Status: None   Collection Time: 09/17/22  9:24  PM  Result Value Ref Range   I-stat hCG, quantitative <5.0 <5 mIU/mL   Comment 3  Comment:   GEST. AGE      CONC.  (mIU/mL)   <=1 WEEK        5 - 50     2 WEEKS       50 - 500     3 WEEKS       100 - 10,000     4 WEEKS     1,000 - 30,000        FEMALE AND NON-PREGNANT FEMALE:     LESS THAN 5 mIU/mL   I-stat chem 8, ED (not at [facility], [facility] or [facility])     Status: Abnormal   Collection Time: 09/17/22  9:25 PM  Result Value Ref Range   Sodium 139 135 - 145 mmol/L   Potassium 3.7 3.5 - 5.1 mmol/L   Chloride 102 98 - 111 mmol/L   BUN 12 6 - 20 mg/dL   Creatinine, Ser 0.60 0.44 - 1.00 mg/dL   Glucose, Bld 96 70 - 99 mg/dL    Comment: Glucose reference range applies only to samples taken after fasting for at least 8 hours.   Calcium, Ion 1.22 1.15 - 1.40 mmol/L   TCO2 26 22 - 32 mmol/L   Hemoglobin 11.2 (L) 12.0 - 15.0 g/dL   HCT 33.0 (L) 36.0 - 46.0 %  Body fluid culture w Gram Stain     Status: None (Preliminary result)   Collection Time: 09/17/22 11:37 PM   Specimen: KNEE; Body Fluid  Result Value Ref Range   Specimen Description      KNEE Performed at [facility], [place] [address], [place], [place] [address]    Special Requests      NONE Performed at [facility], [place] [address], [place], [place] [address]    Gram Stain      FEW WBC PRESENT,BOTH PMN AND MONONUCLEAR NO ORGANISMS SEEN Performed at [facility], [place] [address] [place], [place] [address]    Culture PENDING    Report Status PENDING   Synovial cell count + diff, w/ crystals     Status: Abnormal   Collection Time: 09/17/22 11:37 PM  Result Value Ref Range   Color, Synovial RED (A) YELLOW   Appearance-Synovial CLOUDY (A) CLEAR   Crystals, Fluid NO CRYSTALS SEEN    WBC, Synovial 12,140 (H) 0 - 200 /cu mm   Neutrophil, Synovial 86 (H) 0 - 25 %   Lymphocytes-Synovial Fld 5 0 - 20 %   Monocyte-Macrophage-Synovial Fluid 8 (L) 50 - 90 %   Eosinophils-Synovial 1 0 - 1  %    Comment: Performed at [facility], [place] [address], [place],  [address]  CBC with Differential/Platelet     Status: Abnormal   Collection Time: 09/18/22  5:35 AM  Result Value Ref Range   WBC 7.2 4.0 - 10.5 K/uL   RBC 3.57 (L) 3.87 - 5.11 MIL/uL   Hemoglobin 9.9 (L) 12.0 - 15.0 g/dL   HCT 31.8 (L) 36.0 - 46.0 %   MCV 89.1 80.0 - 100.0 fL   MCH 27.7 26.0 - 34.0 pg   MCHC 31.1 30.0 - 36.0 g/dL   RDW 14.3 11.5 - 15.5 %   Platelets 472 (H) 150 - 400 K/uL   nRBC 0.0 0.0 - 0.2 %   Neutrophils Relative % 94 %   Neutro Abs 6.8 1.7 - 7.7 K/uL   Lymphocytes Relative 5 %   Lymphs Abs 0.4 (L) 0.7 - 4.0 K/uL  Monocytes Relative 1 %   Monocytes Absolute 0.0 (L) 0.1 - 1.0 K/uL   Eosinophils Relative 0 %   Eosinophils Absolute 0.0 0.0 - 0.5 K/uL   Basophils Relative 0 %   Basophils Absolute 0.0 0.0 - 0.1 K/uL   Immature Granulocytes 0 %   Abs Immature Granulocytes 0.03 0.00 - 0.07 K/uL    Comment: Performed at [facility], [place] [address] [place], [place] [address]  Comprehensive metabolic panel     Status: Abnormal   Collection Time: 09/18/22  5:35 AM  Result Value Ref Range   Sodium 134 (L) 135 - 145 mmol/L   Potassium 3.9 3.5 - 5.1 mmol/L   Chloride 99 98 - 111 mmol/L   CO2 24 22 - 32 mmol/L   Glucose, Bld 211 (H) 70 - 99 mg/dL    Comment: Glucose reference range applies only to samples taken after fasting for at least 8 hours.   BUN 11 6 - 20 mg/dL   Creatinine, Ser 0.66 0.44 - 1.00 mg/dL   Calcium 9.2 8.9 - 10.3 mg/dL   Total Protein 8.5 (H) 6.5 - 8.1 g/dL   Albumin 3.1 (L) 3.5 - 5.0 g/dL   AST 21 15 - 41 U/L   ALT 21 0 - 44 U/L   Alkaline Phosphatase 90 38 - 126 U/L   Total Bilirubin 0.5 0.3 - 1.2 mg/dL   GFR, Estimated >60 >60 mL/min    Comment: (NOTE) Calculated using the CKD-EPI Creatinine Equation (2021)    Anion gap 11 5 - 15    Comment: Performed at [facility], [place] [address] [place], [place]  [address]  Magnesium     Status: None   Collection Time: 09/18/22  5:35 AM  Result Value Ref Range   Magnesium 2.2 1.7 - 2.4 mg/dL    Comment: Performed at [facility], [place] [address], [place], [place] [address]  Phosphorus     Status: None   Collection Time: 09/18/22  5:35 AM  Result Value Ref Range   Phosphorus 3.6 2.5 - 4.6 mg/dL    Comment: Performed at [facility], [place] [address] [place],  [address]   MR Lumbar Spine W Wo Contrast  Result Date: 09/17/2022 CLINICAL DATA:  Incontinence, history of rectal cancer, rheumatoid arthritis EXAM: MRI LUMBAR SPINE WITHOUT AND WITH CONTRAST TECHNIQUE: Multiplanar and multiecho pulse sequences of the lumbar spine were obtained without and with intravenous contrast. CONTRAST:  25m GADAVIST GADOBUTROL 1 MMOL/ML IV SOLN COMPARISON:  No prior MRI available FINDINGS: Segmentation:  Standard. Alignment:  Trace retrolisthesis of L5 on S1. Vertebrae: Diffusely decreased marrow signal in the lower lumbar spine, which may be the sequela of prior radiation therapy. No acute fracture or or suspicious osseous lesion. No abnormal enhancement. No evidence of discitis or osteomyelitis. Conus medullaris and cauda equina: Conus extends to the L2 level. Conus and cauda equina appear normal. Paraspinal and other soft tissues: Negative. Disc levels: T12-L1: No significant disc bulge. No spinal canal stenosis or neural foraminal narrowing. L1-L2: No significant disc bulge. No spinal canal stenosis or neural foraminal narrowing. L2-L3: No significant disc bulge. No spinal canal stenosis or neural foraminal narrowing. L3-L4: No significant disc bulge. Mild facet arthropathy. No spinal canal stenosis or neural foraminal narrowing. L4-L5: No significant disc bulge. Mild facet arthropathy. No spinal canal stenosis or neural foraminal narrowing. L5-S1: Central disc protrusion, which narrows the lateral recesses. No spinal canal stenosis or  neural  foraminal narrowing. IMPRESSION: 1. L5-S1 central disc protrusion, which narrows the lateral recesses, which could affect the descending S1 nerve roots. 2. No spinal canal stenosis or neural foraminal narrowing. 3. No evidence of metastatic disease in the lumbar spine. Electronically Signed   By: Merilyn Baba M.D.   On: 09/17/2022 23:48   CT ABDOMEN PELVIS W CONTRAST  Result Date: 09/17/2022 CLINICAL DATA:  Joint swelling and abdominal pain, history of anal cancer EXAM: CT ABDOMEN AND PELVIS WITH CONTRAST TECHNIQUE: Multidetector CT imaging of the abdomen and pelvis was performed using the standard protocol following bolus administration of intravenous contrast. RADIATION DOSE REDUCTION: This exam was performed according to the departmental dose-optimization program which includes automated exposure control, adjustment of the mA and/or kV according to patient size and/or use of iterative reconstruction technique. CONTRAST:  120m OMNIPAQUE IOHEXOL 300 MG/ML  SOLN COMPARISON:  MRI 07/12/2022, CT 05/18/2022 FINDINGS: Lower chest: Lung bases demonstrate no acute airspace disease. Borderline cardiomegaly. Hepatobiliary: No focal liver abnormality is seen. No gallstones, gallbladder wall thickening, or biliary dilatation. Pancreas: Unremarkable. No pancreatic ductal dilatation or surrounding inflammatory changes. Spleen: Normal in size without focal abnormality. Adrenals/Urinary Tract: Adrenal glands are unremarkable. Kidneys are normal, without renal calculi, focal lesion, or hydronephrosis. Bladder is unremarkable. Stomach/Bowel: Stomach is within normal limits. Appendix appears normal. No evidence of bowel wall thickening, distention, or inflammatory changes. Vascular/Lymphatic: No significant vascular findings are present. No enlarged abdominal or pelvic lymph nodes. Reproductive: Enlarged fibroid uterus.  No adnexal mass. Other: Negative for pelvic effusion or free air Musculoskeletal: No acute or  significant osseous findings. IMPRESSION: 1. No CT evidence for acute intra-abdominal or pelvic abnormality. 2. Enlarged fibroid uterus. Electronically Signed   By: KDonavan FoilM.D.   On: 09/17/2022 23:05   DG Knee Complete 4 Views Left  Result Date: 09/17/2022 CLINICAL DATA:  Joint swelling and pain EXAM: LEFT KNEE - COMPLETE 4+ VIEW COMPARISON:  None Available. FINDINGS: No fracture or malalignment. The joint spaces appear patent. Small knee effusion. No erosions. Mineralization within normal limits IMPRESSION: Small knee effusion. No acute osseous abnormality. Electronically Signed   By: KDonavan FoilM.D.   On: 09/17/2022 22:25   DG Hip Unilat W or Wo Pelvis 2-3 Views Right  Result Date: 09/17/2022 CLINICAL DATA:  Hip pain EXAM: DG HIP (WITH OR WITHOUT PELVIS) 2-3V RIGHT COMPARISON:  None Available. FINDINGS: SI joints are patent. Pubic symphysis and rami appear intact. No fracture or malalignment. Mineralization within normal limits. Joint space is patent. IMPRESSION: No acute osseous abnormality. Electronically Signed   By: KDonavan FoilM.D.   On: 09/17/2022 22:24   DG Knee Complete 4 Views Right  Result Date: 09/17/2022 CLINICAL DATA:  Right knee pain, history of rheumatoid generalized joint swelling EXAM: RIGHT KNEE - COMPLETE 4+ VIEW COMPARISON:  08/09/2017 FINDINGS: No fracture or malalignment. Moderate symmetric medial and lateral joint space narrowing progressed compared to prior radiograph. Possible small erosions at the lateral margins of the medial and lateral tibial plateau. Mild patellofemoral narrowing. Small knee effusion. IMPRESSION: 1. Progression of tricompartment arthritis with small knee effusion. Suspected small erosions along the lateral margins of the medial and lateral tibial plateau. Findings most likely related to history of rheumatoid arthritis, though infection is also in the differential. Electronically Signed   By: KDonavan FoilM.D.   On: 09/17/2022 22:23     Pending Labs Unresulted Labs (From admission, onward)     Start     Ordered   09/25/22  0500  Creatinine, serum  (enoxaparin (LOVENOX)    CrCl >/= 30 ml/min)  Weekly,   R     Comments: while on enoxaparin therapy    09/18/22 0232   09/17/22 2338  Uric Acid, Body Fluid  (Arthrocentesis Panel)  ONCE - STAT,   URGENT        09/17/22 2337   09/17/22 2337  Glucose, Body Fluid Other  (Arthrocentesis Panel)  ONCE - STAT,   URGENT        09/17/22 2337   09/17/22 2337  Protein, body fluid (other)  (Arthrocentesis Panel)  ONCE - STAT,   URGENT        09/17/22 2337            Vitals/Pain Today's Vitals   09/18/22 0900 09/18/22 0928 09/18/22 1200 09/18/22 1334  BP: (!) 138/97  (!) 141/89   Pulse: 87  70   Resp: 18  18   Temp:  (!) 97.5 F (36.4 C)  97.6 F (36.4 C)  TempSrc:  Oral  Oral  SpO2: 100%  97%   PainSc:        Isolation Precautions No active isolations  Medications Medications  acetaminophen (TYLENOL) tablet 650 mg (has no administration in time range)  oxyCODONE (Oxy IR/ROXICODONE) immediate release tablet 5 mg (5 mg Oral Given 09/18/22 0559)  HYDROmorphone (DILAUDID) injection 0.5 mg (0.5 mg Intravenous Given 09/18/22 0305)  senna-docusate (Senokot-S) tablet 1 tablet (1 tablet Oral Given 09/18/22 0225)  polyethylene glycol (MIRALAX / GLYCOLAX) packet 17 g (has no administration in time range)  prochlorperazine (COMPAZINE) injection 5 mg (has no administration in time range)  melatonin tablet 3 mg (has no administration in time range)  gabapentin (NEURONTIN) capsule 300 mg (300 mg Oral Given by Other 09/18/22 1030)  mirtazapine (REMERON) tablet 15 mg (15 mg Oral Given 09/18/22 0225)  enoxaparin (LOVENOX) injection 40 mg (40 mg Subcutaneous Given 09/18/22 0947)  predniSONE (DELTASONE) tablet 50 mg (50 mg Oral Given 09/18/22 0847)  morphine (PF) 4 MG/ML injection 4 mg (4 mg Intravenous Given 09/17/22 2122)  sodium chloride 0.9 % bolus 1,000 mL (0 mLs Intravenous Stopped  09/17/22 2209)  methylPREDNISolone sodium succinate (SOLU-MEDROL) 125 mg/2 mL injection 125 mg (125 mg Intravenous Given 09/17/22 2133)  LORazepam (ATIVAN) injection 1 mg (1 mg Intravenous Given 09/17/22 2204)  ondansetron (ZOFRAN) injection 4 mg ( Intravenous Given 09/17/22 2136)  iohexol (OMNIPAQUE) 300 MG/ML solution 100 mL (100 mLs Intravenous Contrast Given 09/17/22 2244)  gadobutrol (GADAVIST) 1 MMOL/ML injection 10 mL (10 mLs Intravenous Contrast Given 09/17/22 2233)  lidocaine (XYLOCAINE) 2 % (with pres) injection 200 mg (200 mg Infiltration Given by Other 09/17/22 2336)  HYDROmorphone (DILAUDID) injection 1 mg (1 mg Intravenous Given 09/17/22 2326)    Mobility walks     Focused Assessments    R Recommendations: See Admitting Provider Note  Report given to:   Additional Notes:

## 2022-09-18 NOTE — H&P (Signed)
History and Physical  Tammy Garrison [id number] DOB: 11/02/1990 DOA: 09/17/2022  Referring physician: Dr. Randal Buba, [place]. PCP: [place]  Outpatient Specialists: Medical oncology, radiation oncology, rheumatology. Patient coming from: Home.  Chief Complaint: Hurting all over, joint swelling and pain.  HPI: Tammy Garrison is a 32 y.o. female with medical history significant for anal cell carcinoma, rheumatoid arthritis, obesity, ambulatory dysfunction walks with a cane at baseline, who presented to [facility] ED from home due to progressive generalized pain and joint swelling for 1 month.  Per the patient she has not seen a rheumatologist since 2021.  Was off medications.  Endorses that since her last chemotherapy she started having diffuse generalized pain and joint swelling.  Due to severe diffuse joint pain she presented initially to her hematologist oncologist, then was referred to the ED for further evaluation.  In the ED, on exam, exquisitely tender with minimal palpation of all joints.  Lab studies notable for sed rate of 123, CRP of 18.2.  EDP performed arthrocentesis on left knee, to rule out septic arthritis, the results are pending.  TRH, hospitalist service, was asked to admit.  ED Course: Tmax 98.3.  BP 193/126, pulse 95, respiration rate 18, O2 saturation 98% on room air.  Lab studies remarkable for albumin 3.1.  Hemoglobin 11.2 from 10.1.  Review of Systems: Review of systems as noted in the HPI. All other systems reviewed and are negative.   Past Medical History:  Diagnosis Date   Anemia    Bipolar 1 disorder ([place])    Cancer ([place])    squamous cell rectal cancer   ETOH abuse    H/O self-harm    H/O suicide attempt    x 5 - last 05/2016 - overdose Ibuprofen   Marijuana abuse    Rheumatoid arthritis ([place])    Past Surgical History:  Procedure Laterality Date   HEMORRHOID SURGERY N/A 02/08/2022   Procedure: EXTERNAL AND INTERNAL HEMORRHOIDECTOMY;   Surgeon: Georganna Skeans, MD;  Location: [place];  Service: General;  Laterality: N/A;   INCISION AND DRAINAGE ABSCESS Right 04/22/2022   Procedure: INCISION AND DRAINAGE axilla;  Surgeon: Kieth Brightly, Arta Bruce, MD;  Location: [facility];  Service: General;  Laterality: Right;   RECTAL EXAM UNDER ANESTHESIA N/A 07/21/2022   Procedure: RECTAL EXAM UNDER ANESTHESIA;  Surgeon: Ileana Roup, MD;  Location: [place];  Service: General;  Laterality: N/A;   TRANSANAL HEMORRHOIDAL DEARTERIALIZATION N/A 07/21/2022   Procedure: ANAL CANAL BIOPSY;  Surgeon: Ileana Roup, MD;  Location: [place];  Service: General;  Laterality: N/A;    Social History:  reports that she quit smoking about 3 years ago. Her smoking use included cigarettes. She has a 1.75 pack-year smoking history. She has never used smokeless tobacco. She reports that she does not currently use alcohol. She reports that she does not currently use drugs after having used the following drugs: Marijuana.   Allergies  Allergen Reactions   Coconut Flavor Itching and Rash    Itchy throat    Family History  Problem Relation Age of Onset   Hypertension Other    Lupus Mother       Prior to Admission medications   Medication Sig Start Date End Date Taking? Authorizing Provider  gabapentin (NEURONTIN) 300 MG capsule Take 1 capsule (300 mg total) by mouth 3 (three) times daily. 07/31/22  Yes Pickenpack-Cousar, Carlena Sax, NP  mirtazapine (REMERON) 15 MG tablet Take 1 tablet (15 mg total) by mouth at bedtime.  08/20/22  Yes Pickenpack-Cousar, Carlena Sax, NP  naproxen (NAPROSYN) 250 MG tablet Take 250 mg by mouth 2 (two) times daily as needed for moderate pain.   Yes [provider]  traMADol-acetaminophen (ULTRACET) 37.5-325 MG tablet Take 1 tablet by mouth every 6 hours as needed. Patient taking differently: Take 1 tablet by mouth every 6 (six) hours as needed for moderate pain. 08/20/22  Yes Pickenpack-Cousar, Athena N, NP  lidocaine  (REGENECARE HA) 2 % jelly Apply 1 Application topically daily. Patient not taking: Reported on 07/09/2022 04/25/22   Edwin Dada, MD  LORazepam (ATIVAN) 0.5 MG tablet Take 1 tablet (0.5 mg total) by mouth every 6 (six) hours as needed (nausea). Patient not taking: Reported on 07/09/2022 04/10/22   Truitt Merle, MD  megestrol (MEGACE) 40 MG tablet Take 1 tablet (40 mg total) by mouth 2 (two) times daily. Patient not taking: Reported on 05/19/2022 04/01/22   Marcello Fennel, PA-C  prochlorperazine (COMPAZINE) 10 MG tablet Take 1 tablet (10 mg total) by mouth every 6 (six) hours as needed for nausea or vomiting. Patient not taking: Reported on 09/17/2022 07/22/22   Jimmy Footman, NP    Physical Exam: BP (!) 142/85   Pulse 100   Temp 97.7 F (36.5 C) (Oral)   Resp 18   SpO2 99%   General: 32 y.o. year-old female well developed well nourished in no acute distress.  Alert and oriented x3. Cardiovascular: Regular rate and rhythm with no rubs or gallops.  No thyromegaly or JVD noted.  Trace lower extremity edema.  Respiratory: Clear to auscultation with no wheezes or rales. Good inspiratory effort. Abdomen: Soft nontender nondistended with normal bowel sounds x4 quadrants. Muskuloskeletal: No cyanosis or clubbing or edema.  Noted bilaterally Neuro: CN II-XII intact, strength, sensation, reflexes Skin: No ulcerative lesions noted or rashes Psychiatry: Judgement and insight appear normal. Mood is appropriate for condition and setting          Labs on Admission:  Basic Metabolic Panel: Recent Labs  Lab 09/17/22 2113 09/17/22 2125  NA 136 139  K 3.6 3.7  CL 99 102  CO2 26  --   GLUCOSE 99 96  BUN 12 12  CREATININE 0.72 0.60  CALCIUM 9.3  --    Liver Function Tests: Recent Labs  Lab 09/17/22 2113  AST 23  ALT 22  ALKPHOS 99  BILITOT 0.4  PROT 8.6*  ALBUMIN 3.1*   No results for input(s): "LIPASE", "AMYLASE" in the last 168 hours. No results for input(s):  "AMMONIA" in the last 168 hours. CBC: Recent Labs  Lab 09/17/22 2113 09/17/22 2125  WBC 5.9  --   NEUTROABS 4.2  --   HGB 10.1* 11.2*  HCT 32.4* 33.0*  MCV 88.8  --   PLT 517*  --    Cardiac Enzymes: No results for input(s): "CKTOTAL", "CKMB", "CKMBINDEX", "TROPONINI" in the last 168 hours.  BNP (last 3 results) No results for input(s): "BNP" in the last 8760 hours.  ProBNP (last 3 results) No results for input(s): "PROBNP" in the last 8760 hours.  CBG: No results for input(s): "GLUCAP" in the last 168 hours.  Radiological Exams on Admission: MR Lumbar Spine W Wo Contrast  Result Date: 09/17/2022 CLINICAL DATA:  Incontinence, history of rectal cancer, rheumatoid arthritis EXAM: MRI LUMBAR SPINE WITHOUT AND WITH CONTRAST TECHNIQUE: Multiplanar and multiecho pulse sequences of the lumbar spine were obtained without and with intravenous contrast. CONTRAST:  34m GADAVIST GADOBUTROL 1 MMOL/ML IV  SOLN COMPARISON:  No prior MRI available FINDINGS: Segmentation:  Standard. Alignment:  Trace retrolisthesis of L5 on S1. Vertebrae: Diffusely decreased marrow signal in the lower lumbar spine, which may be the sequela of prior radiation therapy. No acute fracture or or suspicious osseous lesion. No abnormal enhancement. No evidence of discitis or osteomyelitis. Conus medullaris and cauda equina: Conus extends to the L2 level. Conus and cauda equina appear normal. Paraspinal and other soft tissues: Negative. Disc levels: T12-L1: No significant disc bulge. No spinal canal stenosis or neural foraminal narrowing. L1-L2: No significant disc bulge. No spinal canal stenosis or neural foraminal narrowing. L2-L3: No significant disc bulge. No spinal canal stenosis or neural foraminal narrowing. L3-L4: No significant disc bulge. Mild facet arthropathy. No spinal canal stenosis or neural foraminal narrowing. L4-L5: No significant disc bulge. Mild facet arthropathy. No spinal canal stenosis or neural foraminal  narrowing. L5-S1: Central disc protrusion, which narrows the lateral recesses. No spinal canal stenosis or neural foraminal narrowing. IMPRESSION: 1. L5-S1 central disc protrusion, which narrows the lateral recesses, which could affect the descending S1 nerve roots. 2. No spinal canal stenosis or neural foraminal narrowing. 3. No evidence of metastatic disease in the lumbar spine. Electronically Signed   By: Merilyn Baba M.D.   On: 09/17/2022 23:48   CT ABDOMEN PELVIS W CONTRAST  Result Date: 09/17/2022 CLINICAL DATA:  Joint swelling and abdominal pain, history of anal cancer EXAM: CT ABDOMEN AND PELVIS WITH CONTRAST TECHNIQUE: Multidetector CT imaging of the abdomen and pelvis was performed using the standard protocol following bolus administration of intravenous contrast. RADIATION DOSE REDUCTION: This exam was performed according to the departmental dose-optimization program which includes automated exposure control, adjustment of the mA and/or kV according to patient size and/or use of iterative reconstruction technique. CONTRAST:  120m OMNIPAQUE IOHEXOL 300 MG/ML  SOLN COMPARISON:  MRI 07/12/2022, CT 05/18/2022 FINDINGS: Lower chest: Lung bases demonstrate no acute airspace disease. Borderline cardiomegaly. Hepatobiliary: No focal liver abnormality is seen. No gallstones, gallbladder wall thickening, or biliary dilatation. Pancreas: Unremarkable. No pancreatic ductal dilatation or surrounding inflammatory changes. Spleen: Normal in size without focal abnormality. Adrenals/Urinary Tract: Adrenal glands are unremarkable. Kidneys are normal, without renal calculi, focal lesion, or hydronephrosis. Bladder is unremarkable. Stomach/Bowel: Stomach is within normal limits. Appendix appears normal. No evidence of bowel wall thickening, distention, or inflammatory changes. Vascular/Lymphatic: No significant vascular findings are present. No enlarged abdominal or pelvic lymph nodes. Reproductive: Enlarged fibroid  uterus.  No adnexal mass. Other: Negative for pelvic effusion or free air Musculoskeletal: No acute or significant osseous findings. IMPRESSION: 1. No CT evidence for acute intra-abdominal or pelvic abnormality. 2. Enlarged fibroid uterus. Electronically Signed   By: KDonavan FoilM.D.   On: 09/17/2022 23:05   DG Knee Complete 4 Views Left  Result Date: 09/17/2022 CLINICAL DATA:  Joint swelling and pain EXAM: LEFT KNEE - COMPLETE 4+ VIEW COMPARISON:  None Available. FINDINGS: No fracture or malalignment. The joint spaces appear patent. Small knee effusion. No erosions. Mineralization within normal limits IMPRESSION: Small knee effusion. No acute osseous abnormality. Electronically Signed   By: KDonavan FoilM.D.   On: 09/17/2022 22:25   DG Hip Unilat W or Wo Pelvis 2-3 Views Right  Result Date: 09/17/2022 CLINICAL DATA:  Hip pain EXAM: DG HIP (WITH OR WITHOUT PELVIS) 2-3V RIGHT COMPARISON:  None Available. FINDINGS: SI joints are patent. Pubic symphysis and rami appear intact. No fracture or malalignment. Mineralization within normal limits. Joint space is patent. IMPRESSION: No  acute osseous abnormality. Electronically Signed   By: Donavan Foil M.D.   On: 09/17/2022 22:24   DG Knee Complete 4 Views Right  Result Date: 09/17/2022 CLINICAL DATA:  Right knee pain, history of rheumatoid generalized joint swelling EXAM: RIGHT KNEE - COMPLETE 4+ VIEW COMPARISON:  08/09/2017 FINDINGS: No fracture or malalignment. Moderate symmetric medial and lateral joint space narrowing progressed compared to prior radiograph. Possible small erosions at the lateral margins of the medial and lateral tibial plateau. Mild patellofemoral narrowing. Small knee effusion. IMPRESSION: 1. Progression of tricompartment arthritis with small knee effusion. Suspected small erosions along the lateral margins of the medial and lateral tibial plateau. Findings most likely related to history of rheumatoid arthritis, though infection is also  in the differential. Electronically Signed   By: Donavan Foil M.D.   On: 09/17/2022 22:23    EKG: I independently viewed the EKG done and my findings are as followed: None available at the time of this visit.  Assessment/Plan Present on Admission:  Joint swelling  Principal Problem:   Joint swelling  Diffuse joint swelling. History of rheumatoid arthritis Inflammatory markers are elevated CRP 14.8 sed rate 123. Status post arthrocentesis of left knee.  Follow body fluid results to rule out septic arthritis. Supportive care  Anal squamous cell carcinoma Follows with Dr. Burr Medico  Anemia of chronic disease Hemoglobin is stable No reported overt bleeding.  Thrombocytosis, suspect reactive Platelet count 517, monitor for now.  Generalized weakness PT OT assessment Fall precautions   DVT prophylaxis: Subcu Lovenox daily  Code Status: Full code  Family Communication: None at bedside  Disposition Plan: Admitted to telemetry unit  Consults called: None.  Consult GI in the morning.  Admission status: Observation status.   Status is: Observation    Kayleen Memos MD [facility] Pager [phone]  If 7PM-7AM, please contact night-coverage [email or web address] Password [email or web address]  09/18/2022, 2:06 AM

## 2022-09-18 NOTE — ED Notes (Signed)
Pt is wondering when she will be moving to a floor because the stretcher is hard on her back.  Informed pt that the request was in but it depends on bed availability upstairs.

## 2022-09-18 NOTE — Progress Notes (Addendum)
Brief same-day note  Patient is a 32 year old female with history of renal cell carcinoma currently in remission, rheumatoid arthritis, obesity who presented to the emergency department with complaint of joint pains, swelling for a month.  On presentation, joints were tender.  Left knee was swollen and tender.  Bedside arthrocentesis performed.  Patient started on steroid for suspicion of flare of rheumatoid arthritis.  PT/OT evaluation pending. Seen and examined at bedside today.  She looked comfortable during my evaluation.  Feels better than yesterday  Assessment and plan:  Rheumatoid arthritis flare/joint pain: Presented with polyarthritis.  Left knee was swollen and tender.  No obvious deformity on the hands.  Arthrocentesis done at the bedside.  Elevated CRP, ESR.  Fluid analysis did not show any organism but shows WC count of 12,140 suggesting inflammatory cause.  Septic arthritis typically shows fluid cell count of more than 50,000. Given a dose of Solu-Medrol.  Continue prednisone with slow taper. She has not seen a rheumatologist since 2021.  She is supposed to see Dr. Benjamine Mola, rheumatology as an outpatient.  We recommend to make an appointment with rheumatology as soon as possible Due to joint pain, patient has difficulty ambulating and ambulates with help of cane.  PT/OT consulted  Anal cell carcinoma: Status post chemo, radiation.  Currently in remission.  Follows with Dr. Burr Medico  Normocytic anemia: Current hemoglobin stable  Obesity: Weighing 115.6 kg  Anticipate discharge tomorrow.

## 2022-09-19 ENCOUNTER — Inpatient Hospital Stay (HOSPITAL_COMMUNITY): Payer: Medicaid Other

## 2022-09-19 DIAGNOSIS — M2540 Effusion, unspecified joint: Secondary | ICD-10-CM | POA: Diagnosis not present

## 2022-09-19 LAB — BASIC METABOLIC PANEL
Anion gap: 6 (ref 5–15)
BUN: 15 mg/dL (ref 6–20)
CO2: 26 mmol/L (ref 22–32)
Calcium: 8.6 mg/dL — ABNORMAL LOW (ref 8.9–10.3)
Chloride: 105 mmol/L (ref 98–111)
Creatinine, Ser: 0.77 mg/dL (ref 0.44–1.00)
GFR, Estimated: >60 mL/min (ref >60)
Glucose, Bld: 123 mg/dL — ABNORMAL HIGH (ref 70–99)
Potassium: 3.4 mmol/L — ABNORMAL LOW (ref 3.5–5.1)
Sodium: 137 mmol/L (ref 135–145)

## 2022-09-19 LAB — CBC
HCT: 32.7 % — ABNORMAL LOW (ref 36.0–46.0)
Hemoglobin: 10.2 g/dL — ABNORMAL LOW (ref 12.0–15.0)
MCH: 27.7 pg (ref 26.0–34.0)
MCHC: 31.2 g/dL (ref 30.0–36.0)
MCV: 88.9 fL (ref 80.0–100.0)
Platelets: 493 10*3/uL — ABNORMAL HIGH (ref 150–400)
RBC: 3.68 MIL/uL — ABNORMAL LOW (ref 3.87–5.11)
RDW: 14.5 % (ref 11.5–15.5)
WBC: 11.9 10*3/uL — ABNORMAL HIGH (ref 4.0–10.5)
nRBC: 0 % (ref 0.0–0.2)

## 2022-09-19 LAB — MAGNESIUM: Magnesium: 2 mg/dL (ref 1.7–2.4)

## 2022-09-19 MED ORDER — POTASSIUM CHLORIDE 20 MEQ PO PACK
40.0000 meq | PACK | Freq: Two times a day (BID) | ORAL | Status: AC
  Administered 2022-09-19 (×2): 40 meq via ORAL
  Filled 2022-09-19 (×2): qty 2

## 2022-09-19 MED ORDER — OXYCODONE HCL 5 MG PO TABS
5.0000 mg | ORAL_TABLET | Freq: Four times a day (QID) | ORAL | Status: AC | PRN
  Administered 2022-09-19 – 2022-09-20 (×2): 5 mg via ORAL
  Filled 2022-09-19 (×2): qty 1

## 2022-09-19 NOTE — Progress Notes (Signed)
PROGRESS NOTE    Tammy Garrison  [id number] DOB: 1991-03-08 DOA: 09/17/2022 PCP: [facility]   Brief Narrative:   Tammy Garrison is a 32 y.o. female with medical history significant for anal cell carcinoma, rheumatoid arthritis, obesity, ambulatory dysfunction walks with a cane at baseline, who presented to [facility] ED from home due to progressive generalized pain and joint swelling for 1 month.  Per the patient she has not seen a rheumatologist since 2021.  Was off medications.  Endorses that since her last chemotherapy she started having diffuse generalized pain and joint swelling.  Due to severe diffuse joint pain she presented initially to her hematologist oncologist, then was referred to the ED for further evaluation.   In the ED, on exam, exquisitely tender with minimal palpation of all joints.  Lab studies notable for sed rate of 123, CRP of 18.2.  EDP performed arthrocentesis on left knee, to rule out septic arthritis, the results are pending.  TRH, hospitalist service, was asked to admit.   ED Course: Tmax 98.3.  BP 193/126, pulse 95, respiration rate 18, O2 saturation 98% on room air.  Lab studies remarkable for albumin 3.1.  Hemoglobin 11.2 from 10.1.  Assessment & Plan:   Rheumatoid arthritis flare Diffuse joint pain and swelling: -Arthrocentesis done at the bedside in the ER on 09/18/2022. -Inflammatory markers are elevated CRP 14.8 sed rate 123. -Fluid analysis shows WBC of 12,140, no organisms suggesting inflammatory cause.  Culture: No growth till date.  Received Solu-Medrol yesterday.  Currently on prednisone 50 mg daily -Remained afebrile.  Has leukocytosis likely due to prednisone -PT/OT evaluation pending -Needs outpatient rheumatology follow-up with Dr. Benjamine Mola for further management of rheumatoid arthritis.  Hypokalemia: Replenished.  Check magnesium level.  Repeat BMP tomorrow a.m.   Anal squamous cell carcinoma -Follows with Dr. Burr Medico   Anemia  of chronic disease -Hemoglobin is stable -No reported overt bleeding.   Thrombocytosis, suspect reactive -monitor for now.   Generalized weakness PT OT assessment Fall precautions  DVT prophylaxis: Lovenox Code Status: Full code Family Communication:  None present at bedside.  Plan of care discussed with patient in length and she verbalized understanding and agreed with it. Disposition Plan: Home tomorrow  Consultants:  None  Procedures:  Joint aspiration  Antimicrobials:  None  Status is: Inpatient    Subjective: Patient seen and examined.  Resting comfortably on the bed and eating breakfast.  Continues to have bilateral leg swelling however improved than arrival.  Reports that her both shoulders (R>L) started hurting since this morning.  Has severe joint stiffness due to history of rheumatoid arthritis.  Remained afebrile.  No acute events overnight.  Objective: Vitals:   09/18/22 1950 09/19/22 0200 09/19/22 0600 09/19/22 0757  BP: 129/85 129/80 (!) 132/96 (!) 123/92  Pulse: 98 85 85 74  Resp: 16 15 15 16  $ Temp: 98.1 F (36.7 C) 98.1 F (36.7 C) 97.9 F (36.6 C) 98.1 F (36.7 C)  TempSrc: Oral Oral Oral Oral  SpO2: 98% 98% 98% 100%  Weight:      Height:        Intake/Output Summary (Last 24 hours) at 09/19/2022 1259 Last data filed at 09/19/2022 0758 Gross per 24 hour  Intake 240 ml  Output 360 ml  Net -120 ml   Filed Weights   09/18/22 1510  Weight: 115 kg    Examination:  General exam: Appears calm and comfortable, on room air, communicating well, eating breakfast Respiratory system: Clear to  auscultation. Respiratory effort normal. Cardiovascular system: S1 & S2 heard, RRR. No JVD, murmurs, rubs, gallops or clicks. No pedal edema. Gastrointestinal system: Abdomen is nondistended, soft and nontender. No organomegaly or masses felt. Normal bowel sounds heard. Central nervous system: Alert and oriented. No focal neurological deficits. Extremities:  Bilateral knee joint swelling (L>R)-not erythematous, no warmth to touch, no restricted range of motion noted with flexion or extension. Skin: No rashes, lesions or ulcers Psychiatry: Judgement and insight appear normal. Mood & affect appropriate.    Data Reviewed: I have personally reviewed following labs and imaging studies  CBC: Recent Labs  Lab 09/17/22 2113 09/17/22 2125 09/18/22 0535 09/19/22 1058  WBC 5.9  --  7.2 11.9*  NEUTROABS 4.2  --  6.8  --   HGB 10.1* 11.2* 9.9* 10.2*  HCT 32.4* 33.0* 31.8* 32.7*  MCV 88.8  --  89.1 88.9  PLT 517*  --  472* [id number]*   Basic Metabolic Panel: Recent Labs  Lab 09/17/22 2113 09/17/22 2125 09/18/22 0535 09/19/22 1058  NA 136 139 134* 137  K 3.6 3.7 3.9 3.4*  CL 99 102 99 105  CO2 26  --  24 26  GLUCOSE 99 96 211* 123*  BUN 12 12 11 15  $ CREATININE 0.72 0.60 0.66 0.77  CALCIUM 9.3  --  9.2 8.6*  MG  --   --  2.2  --   PHOS  --   --  3.6  --    GFR: Estimated Creatinine Clearance: 147.3 mL/min (by C-G formula based on SCr of 0.77 mg/dL). Liver Function Tests: Recent Labs  Lab 09/17/22 2113 09/18/22 0535  AST 23 21  ALT 22 21  ALKPHOS 99 90  BILITOT 0.4 0.5  PROT 8.6* 8.5*  ALBUMIN 3.1* 3.1*   No results for input(s): "LIPASE", "AMYLASE" in the last 168 hours. No results for input(s): "AMMONIA" in the last 168 hours. Coagulation Profile: No results for input(s): "INR", "PROTIME" in the last 168 hours. Cardiac Enzymes: No results for input(s): "CKTOTAL", "CKMB", "CKMBINDEX", "TROPONINI" in the last 168 hours. BNP (last 3 results) No results for input(s): "PROBNP" in the last 8760 hours. HbA1C: No results for input(s): "HGBA1C" in the last 72 hours. CBG: No results for input(s): "GLUCAP" in the last 168 hours. Lipid Profile: No results for input(s): "CHOL", "HDL", "LDLCALC", "TRIG", "CHOLHDL", "LDLDIRECT" in the last 72 hours. Thyroid Function Tests: Recent Labs    09/17/22 2113  TSH 1.174   Anemia Panel: No  results for input(s): "VITAMINB12", "FOLATE", "FERRITIN", "TIBC", "IRON", "RETICCTPCT" in the last 72 hours. Sepsis Labs: No results for input(s): "PROCALCITON", "LATICACIDVEN" in the last 168 hours.  Recent Results (from the past 240 hour(s))  Body fluid culture w Gram Stain     Status: None (Preliminary result)   Collection Time: 09/17/22 11:37 PM   Specimen: KNEE; Body Fluid  Result Value Ref Range Status   Specimen Description   Final    KNEE Performed at [facility] [address], [place], [place] [address]    Special Requests   Final    NONE Performed at [facility], [place] [address] [place], [place] [address]    Gram Stain   Final    FEW WBC PRESENT,BOTH PMN AND MONONUCLEAR NO ORGANISMS SEEN    Culture   Final    NO GROWTH 1 DAY Performed at [facility], [place] [address], [place], [place] [address]    Report Status PENDING  Incomplete      Radiology Studies: MR Lumbar Spine W Wo Contrast  Result Date: 09/17/2022 CLINICAL DATA:  Incontinence, history of rectal cancer, rheumatoid arthritis EXAM: MRI LUMBAR SPINE WITHOUT AND WITH CONTRAST TECHNIQUE: Multiplanar and multiecho pulse sequences of the lumbar spine were obtained without and with intravenous contrast. CONTRAST:  22m GADAVIST GADOBUTROL 1 MMOL/ML IV SOLN COMPARISON:  No prior MRI available FINDINGS: Segmentation:  Standard. Alignment:  Trace retrolisthesis of L5 on S1. Vertebrae: Diffusely decreased marrow signal in the lower lumbar spine, which may be the sequela of prior radiation therapy. No acute fracture or or suspicious osseous lesion. No abnormal enhancement. No evidence of discitis or osteomyelitis. Conus medullaris and cauda equina: Conus extends to the L2 level. Conus and cauda equina appear normal. Paraspinal and other soft tissues: Negative. Disc levels: T12-L1: No significant disc bulge. No spinal canal stenosis or neural foraminal narrowing. L1-L2: No  significant disc bulge. No spinal canal stenosis or neural foraminal narrowing. L2-L3: No significant disc bulge. No spinal canal stenosis or neural foraminal narrowing. L3-L4: No significant disc bulge. Mild facet arthropathy. No spinal canal stenosis or neural foraminal narrowing. L4-L5: No significant disc bulge. Mild facet arthropathy. No spinal canal stenosis or neural foraminal narrowing. L5-S1: Central disc protrusion, which narrows the lateral recesses. No spinal canal stenosis or neural foraminal narrowing. IMPRESSION: 1. L5-S1 central disc protrusion, which narrows the lateral recesses, which could affect the descending S1 nerve roots. 2. No spinal canal stenosis or neural foraminal narrowing. 3. No evidence of metastatic disease in the lumbar spine. Electronically Signed   By: AMerilyn BabaM.D.   On: 09/17/2022 23:48   CT ABDOMEN PELVIS W CONTRAST  Result Date: 09/17/2022 CLINICAL DATA:  Joint swelling and abdominal pain, history of anal cancer EXAM: CT ABDOMEN AND PELVIS WITH CONTRAST TECHNIQUE: Multidetector CT imaging of the abdomen and pelvis was performed using the standard protocol following bolus administration of intravenous contrast. RADIATION DOSE REDUCTION: This exam was performed according to the departmental dose-optimization program which includes automated exposure control, adjustment of the mA and/or kV according to patient size and/or use of iterative reconstruction technique. CONTRAST:  1092mOMNIPAQUE IOHEXOL 300 MG/ML  SOLN COMPARISON:  MRI 07/12/2022, CT 05/18/2022 FINDINGS: Lower chest: Lung bases demonstrate no acute airspace disease. Borderline cardiomegaly. Hepatobiliary: No focal liver abnormality is seen. No gallstones, gallbladder wall thickening, or biliary dilatation. Pancreas: Unremarkable. No pancreatic ductal dilatation or surrounding inflammatory changes. Spleen: Normal in size without focal abnormality. Adrenals/Urinary Tract: Adrenal glands are unremarkable.  Kidneys are normal, without renal calculi, focal lesion, or hydronephrosis. Bladder is unremarkable. Stomach/Bowel: Stomach is within normal limits. Appendix appears normal. No evidence of bowel wall thickening, distention, or inflammatory changes. Vascular/Lymphatic: No significant vascular findings are present. No enlarged abdominal or pelvic lymph nodes. Reproductive: Enlarged fibroid uterus.  No adnexal mass. Other: Negative for pelvic effusion or free air Musculoskeletal: No acute or significant osseous findings. IMPRESSION: 1. No CT evidence for acute intra-abdominal or pelvic abnormality. 2. Enlarged fibroid uterus. Electronically Signed   By: KiDonavan Foil.D.   On: 09/17/2022 23:05   DG Knee Complete 4 Views Left  Result Date: 09/17/2022 CLINICAL DATA:  Joint swelling and pain EXAM: LEFT KNEE - COMPLETE 4+ VIEW COMPARISON:  None Available. FINDINGS: No fracture or malalignment. The joint spaces appear patent. Small knee effusion. No erosions. Mineralization within normal limits IMPRESSION: Small knee effusion. No acute osseous abnormality. Electronically Signed   By: KiDonavan Foil.D.   On:  09/17/2022 22:25   DG Hip Unilat W or Wo Pelvis 2-3 Views Right  Result Date: 09/17/2022 CLINICAL DATA:  Hip pain EXAM: DG HIP (WITH OR WITHOUT PELVIS) 2-3V RIGHT COMPARISON:  None Available. FINDINGS: SI joints are patent. Pubic symphysis and rami appear intact. No fracture or malalignment. Mineralization within normal limits. Joint space is patent. IMPRESSION: No acute osseous abnormality. Electronically Signed   By: Donavan Foil M.D.   On: 09/17/2022 22:24   DG Knee Complete 4 Views Right  Result Date: 09/17/2022 CLINICAL DATA:  Right knee pain, history of rheumatoid generalized joint swelling EXAM: RIGHT KNEE - COMPLETE 4+ VIEW COMPARISON:  08/09/2017 FINDINGS: No fracture or malalignment. Moderate symmetric medial and lateral joint space narrowing progressed compared to prior radiograph. Possible  small erosions at the lateral margins of the medial and lateral tibial plateau. Mild patellofemoral narrowing. Small knee effusion. IMPRESSION: 1. Progression of tricompartment arthritis with small knee effusion. Suspected small erosions along the lateral margins of the medial and lateral tibial plateau. Findings most likely related to history of rheumatoid arthritis, though infection is also in the differential. Electronically Signed   By: Donavan Foil M.D.   On: 09/17/2022 22:23    Scheduled Meds:  enoxaparin (LOVENOX) injection  40 mg Subcutaneous Q24H   gabapentin  300 mg Oral TID   mirtazapine  15 mg Oral QHS   potassium chloride  40 mEq Oral BID   predniSONE  50 mg Oral Q breakfast   senna-docusate  1 tablet Oral QHS   Continuous Infusions:   LOS: 1 day   Time spent: 35 minutes   Tal Kempker Loann Quill, MD [facility]  If 7PM-7AM, please contact night-coverage [email or web address] 09/19/2022, 12:59 PM

## 2022-09-19 NOTE — Evaluation (Signed)
Physical Therapy Evaluation Patient Details Name: Tammy Garrison MRN: [id number] DOB: 1991/01/28 Today's Date: 09/19/2022  History of Present Illness  Patient is a 32 year old female who presented to ED from home due to progressive generalized pain and joint swelling for 1 month. patient was admitted with Rheumatoid arthritis flare. [id number] cell carcinoma, rheumatoid arthritis, obesity, ambulatory dysfunction  Clinical Impression  Pt admitted as above and presenting with functional mobility limitations 2* decreased activity tolerance and mild balance deficits associated with current pain level.  Pt up to ambulate with cane in hall with good balance and safety awareness but distance pain limited.  Pt reports stairs to 2nd floor apartment and will follow up with same.     Recommendations for follow up therapy are one component of a multi-disciplinary discharge planning process, led by the attending physician.  Recommendations may be updated based on patient status, additional functional criteria and insurance authorization.  Follow Up Recommendations No PT follow up      Assistance Recommended at Discharge PRN  Patient can return home with the following  Assistance with cooking/housework;Help with stairs or ramp for entrance;Assist for transportation    Equipment Recommendations None recommended by PT  Recommendations for Other Services       Functional Status Assessment Patient has had a recent decline in their functional status and demonstrates the ability to make significant improvements in function in a reasonable and predictable amount of time.     Precautions / Restrictions Precautions Precautions: Fall Restrictions Weight Bearing Restrictions: No      Mobility  Bed Mobility                    Transfers Overall transfer level: Modified independent                 General transfer comment: no physical assist    Ambulation/Gait Ambulation/Gait  assistance: Supervision Gait Distance (Feet): 110 Feet Assistive device: Straight cane Gait Pattern/deviations: Step-through pattern, Decreased step length - right, Decreased step length - left, Wide base of support Gait velocity: decr     General Gait Details: increased time but noted increased BOS, decreased knee flexion but very good safety awareness and min instability with use of cane  Stairs            Wheelchair Mobility    Modified Rankin (Stroke Patients Only)       Balance                                             Pertinent Vitals/Pain Pain Assessment Pain Assessment: Faces Faces Pain Scale: Hurts whole lot Pain Location: R shoulder, and 4/10 bil knees Pain Descriptors / Indicators: Grimacing, Guarding Pain Intervention(s): Limited activity within patient's tolerance, Monitored during session, Premedicated before session    Home Living Family/patient expects to be discharged to:: Private residence Living Arrangements: Non-relatives/Friends Available Help at Discharge: Friend(s);Available PRN/intermittently Type of Home: Apartment Home Access: Stairs to enter Entrance Stairs-Rails: Right Entrance Stairs-Number of Steps: about 12   Home Layout: One level Home Equipment: Cane - single point      Prior Function Prior Level of Function : Independent/Modified Independent               ADLs Comments: patient has best friend living with her. she is currently out of town for a week. patient reported being  independent at cane level with encouragement from friend to participate in self feeding.     Hand Dominance   Dominant Hand: Right    Extremity/Trunk Assessment   Upper Extremity Assessment Upper Extremity Assessment: RUE deficits/detail;LUE deficits/detail RUE Deficits / Details: Patient had about 30 degrees FF with increased pain with all movements. patient is able to Abduct AROM about 40 degrees with increased pain. patient  is sensitive to light touch, no redness or brusing noted. MD messaged about imaging as there is none in the chart RUE: Shoulder pain at rest;Unable to fully assess due to pain RUE Coordination: decreased fine motor LUE Deficits / Details: history of RA LUE Coordination: decreased fine motor    Lower Extremity Assessment Lower Extremity Assessment: Defer to PT evaluation    Cervical / Trunk Assessment Cervical / Trunk Assessment: Normal  Communication   Communication: No difficulties  Cognition Arousal/Alertness: Awake/alert Behavior During Therapy: WFL for tasks assessed/performed Overall Cognitive Status: Within Functional Limits for tasks assessed                                          General Comments General comments (skin integrity, edema, etc.): patient was also educated on how heat was not indicated as pain reducer for RA flare up. patient reported that she had used heat at home and it did not help in the past. patient was educated that it increased the inflamatory process. patient verbalized understanding.    Exercises     Assessment/Plan    PT Assessment Patient needs continued PT services  PT Problem List Decreased activity tolerance;Decreased balance;Pain       PT Treatment Interventions DME instruction;Gait training;Stair training;Functional mobility training    PT Goals (Current goals can be found in the Care Plan section)  Acute Rehab PT Goals Patient Stated Goal: Less pain; regain IND PT Goal Formulation: With patient Time For Goal Achievement: 10/01/22 Potential to Achieve Goals: Good    Frequency Min 3X/week     Co-evaluation PT/OT/SLP Co-Evaluation/Treatment: Yes Reason for Co-Treatment: Other (comment) PT goals addressed during session: Mobility/safety with mobility OT goals addressed during session: ADL's and self-care       AM-PAC PT "6 Clicks" Mobility  Outcome Measure Help needed turning from your back to your side  while in a flat bed without using bedrails?: None Help needed moving from lying on your back to sitting on the side of a flat bed without using bedrails?: None Help needed moving to and from a bed to a chair (including a wheelchair)?: None Help needed standing up from a chair using your arms (e.g., wheelchair or bedside chair)?: None Help needed to walk in hospital room?: A Little Help needed climbing 3-5 steps with a railing? : A Lot 6 Click Score: 21    End of Session         PT Visit Diagnosis: Difficulty in walking, not elsewhere classified (R26.2)    Time: [id number] PT Time Calculation (min) (ACUTE ONLY): 26 min   Charges:   PT Evaluation $PT Eval Low Complexity: 1 Low          [place] Pager [phone] Office [phone]   Akiva Brassfield 09/19/2022, 4:45 PM

## 2022-09-19 NOTE — Plan of Care (Signed)
  Problem: Activity: Goal: Risk for activity intolerance will decrease Outcome: Progressing   

## 2022-09-19 NOTE — Evaluation (Signed)
Occupational Therapy Evaluation Patient Details Name: Tammy Garrison MRN: [id number] DOB: [birth year] Today's Date: 09/19/2022   History of Present Illness Patient is a 32 year old female who presented to ED from home due to progressive generalized pain and joint swelling for 1 month. patient was admitted with Rheumatoid arthritis flare. [id number] carcinoma, rheumatoid arthritis, obesity, ambulatory dysfunction   Clinical Impression   Patient is a 33 year old female who was admitted for above. Patient was living at home on 2 floor apartment with roommate. Patient currently is supervision for ADL tasks with increased time at cane level with patient noted to have decreased use of RUE with increased pain and decreased ROM. Patient pending imaging to RUE at this time with noted history of RA in chart. Patient would continue to benefit from skilled OT services at this time while admitted to address noted deficits in order to improve overall safety and independence in ADLs.         Recommendations for follow up therapy are one component of a multi-disciplinary discharge planning process, led by the attending physician.  Recommendations may be updated based on patient status, additional functional criteria and insurance authorization.   Follow Up Recommendations  No OT follow up     Assistance Recommended at Discharge Intermittent Supervision/Assistance  Patient can return home with the following Assistance with cooking/housework;Assist for transportation;Help with stairs or ramp for entrance    Functional Status Assessment  Patient has had a recent decline in their functional status and demonstrates the ability to make significant improvements in function in a reasonable and predictable amount of time.  Equipment Recommendations  BSC/3in1       Precautions / Restrictions Restrictions Weight Bearing Restrictions: No      Mobility Bed Mobility Overal bed mobility: Modified  Independent             Balance Overall balance assessment: Mild deficits observed, not formally tested         ADL either performed or assessed with clinical judgement   ADL Overall ADL's : Needs assistance/impaired Eating/Feeding: Modified independent;Sitting   Grooming: Set up;Sitting   Upper Body Bathing: Set up;Sitting   Lower Body Bathing: Set up;Sitting/lateral leans   Upper Body Dressing : Set up;Sitting   Lower Body Dressing: Set up;Sitting/lateral leans   Toilet Transfer: Supervision/safety Toilet Transfer Details (indicate cue type and reason): with cane with increased time. patient was able to participate in functional mobility in hallway with personal cane. patient was slow and purposeful with movements. good safety awareness. patient reported she did not like sitting on  3in 1 commodes in bathroom but had diffuclty with getting up and down off commode with increased pain in R shoulder with R shoulder one on grab bar side. patient was educated on using BSC to reduce pressure on R shoulder. patient vebralized understanding. 3 in 1 replaced back on commode. Toileting- Clothing Manipulation and Hygiene: Supervision/safety;Sitting/lateral lean;Sit to/from Nurse, children's Details (indicate cue type and reason): patient was educated on different types of shower seats. patient verbalized understanding.   General ADL Comments: patient was educated on participation in [place] but deligating tasks if she can that she does not have to do. patient vebralized understanding. patient also reported low toilet at home. patient was educated on various different toileting options to make toilet higher and handles to push up from bilaterally. patient verbalized understanding. patient reported she would think about it.     Vision  Vision Assessment?: No apparent visual deficits            Pertinent Vitals/Pain Pain Assessment Pain Assessment: Faces Faces Pain Scale:  Hurts whole lot Pain Location: R shoulder, Pain Descriptors / Indicators: Grimacing, Guarding Pain Intervention(s): Limited activity within patient's tolerance, Monitored during session, Repositioned, Premedicated before session     Hand Dominance Right   Extremity/Trunk Assessment Upper Extremity Assessment Upper Extremity Assessment: RUE deficits/detail;LUE deficits/detail RUE Deficits / Details: Patient had about 30 degrees FF with increased pain with all movements. patient is able to Abduct AROM about 40 degrees with increased pain. patient is sensitive to light touch, no redness or brusing noted. MD messaged about imaging as there is none in the chart RUE: Shoulder pain at rest;Unable to fully assess due to pain RUE Coordination: decreased fine motor LUE Deficits / Details: history of RA LUE Coordination: decreased fine motor   Lower Extremity Assessment Lower Extremity Assessment: Defer to PT evaluation   Cervical / Trunk Assessment Cervical / Trunk Assessment: Normal   Communication Communication Communication: No difficulties   Cognition Arousal/Alertness: Awake/alert Behavior During Therapy: WFL for tasks assessed/performed Overall Cognitive Status: Within Functional Limits for tasks assessed         General Comments  patient was also educated on how heat was not indicated as pain reducer for RA flare up. patient reported that she had used heat at home and it did not help in the past. patient was educated that it increased the inflamatory process. patient verbalized understanding.            Home Living Family/patient expects to be discharged to:: Private residence Living Arrangements: Non-relatives/Friends Available Help at Discharge: Friend(s);Available PRN/intermittently Type of Home: Apartment Home Access: Stairs to enter Entrance Stairs-Number of Steps: about 12   Home Layout: One level     Bathroom Shower/Tub: Walk-in shower         Home  Equipment: Kasandra Knudsen - single point          Prior Functioning/Environment Prior Level of Function : Independent/Modified Independent               ADLs Comments: patient has best friend living with her. she is currently out of town for a week. patient reported being independent at cane level with encouragement from friend to participate in self feeding.        OT Problem List: Decreased strength;Decreased activity tolerance;Impaired balance (sitting and/or standing);Decreased coordination;Decreased safety awareness;Decreased knowledge of precautions;Pain;Impaired UE functional use;Decreased knowledge of use of DME or AE      OT Treatment/Interventions: Self-care/ADL training;Energy conservation;Therapeutic exercise;DME and/or AE instruction;Therapeutic activities;Patient/family education;Balance training    OT Goals(Current goals can be found in the care plan section) Acute Rehab OT Goals Patient Stated Goal: to get pain under control OT Goal Formulation: With patient Time For Goal Achievement: 10/03/22 Potential to Achieve Goals: Fair  OT Frequency: Min 2X/week    Co-evaluation PT/OT/SLP Co-Evaluation/Treatment: Yes Reason for Co-Treatment: Other (comment) (patients increased pain with movement) PT goals addressed during session: Mobility/safety with mobility OT goals addressed during session: ADL's and self-care      AM-PAC OT "6 Clicks" Daily Activity     Outcome Measure Help from another person eating meals?: None Help from another person taking care of personal grooming?: None Help from another person toileting, which includes using toliet, bedpan, or urinal?: A Little Help from another person bathing (including washing, rinsing, drying)?: A Little Help from another person to put on and taking off  regular upper body clothing?: A Little Help from another person to put on and taking off regular lower body clothing?: A Little 6 Click Score: 20   End of Session Equipment  Utilized During Treatment: Gait belt;Rolling walker (2 wheels) Nurse Communication: Mobility status  Activity Tolerance: Patient tolerated treatment well Patient left: in chair;with call bell/phone within reach;with nursing/sitter in room  OT Visit Diagnosis: Unsteadiness on feet (R26.81);Other abnormalities of gait and mobility (R26.89);Muscle weakness (generalized) (M62.81);Pain                Time: [id number] OT Time Calculation (min): 28 min Charges:  OT General Charges $OT Visit: 1 Visit OT Evaluation $OT Eval Low Complexity: 1 Low  Tammy Garrison OTR/L, MS Acute Rehabilitation Department Office# [phone]   Willa Rough 09/19/2022, 4:05 PM

## 2022-09-20 DIAGNOSIS — M2540 Effusion, unspecified joint: Secondary | ICD-10-CM | POA: Diagnosis not present

## 2022-09-20 LAB — BASIC METABOLIC PANEL
Anion gap: 6 (ref 5–15)
BUN: 17 mg/dL (ref 6–20)
CO2: 23 mmol/L (ref 22–32)
Calcium: 8.8 mg/dL — ABNORMAL LOW (ref 8.9–10.3)
Chloride: 106 mmol/L (ref 98–111)
Creatinine, Ser: 0.78 mg/dL (ref 0.44–1.00)
GFR, Estimated: >60 mL/min (ref >60)
Glucose, Bld: 112 mg/dL — ABNORMAL HIGH (ref 70–99)
Potassium: 3.5 mmol/L (ref 3.5–5.1)
Sodium: 135 mmol/L (ref 135–145)

## 2022-09-20 LAB — CBC
HCT: 31.9 % — ABNORMAL LOW (ref 36.0–46.0)
Hemoglobin: 9.7 g/dL — ABNORMAL LOW (ref 12.0–15.0)
MCH: 27.4 pg (ref 26.0–34.0)
MCHC: 30.4 g/dL (ref 30.0–36.0)
MCV: 90.1 fL (ref 80.0–100.0)
Platelets: 455 10*3/uL — ABNORMAL HIGH (ref 150–400)
RBC: 3.54 MIL/uL — ABNORMAL LOW (ref 3.87–5.11)
RDW: 14.5 % (ref 11.5–15.5)
WBC: 9.5 10*3/uL (ref 4.0–10.5)
nRBC: 0 % (ref 0.0–0.2)

## 2022-09-20 LAB — C-REACTIVE PROTEIN: CRP: 4.6 mg/dL — ABNORMAL HIGH (ref <1.0)

## 2022-09-20 LAB — SEDIMENTATION RATE: Sed Rate: 110 mm/hr — ABNORMAL HIGH (ref 0–22)

## 2022-09-20 MED ORDER — PREDNISONE 10 MG PO TABS: ORAL_TABLET | ORAL | 0 refills | Status: AC

## 2022-09-20 NOTE — Progress Notes (Signed)
  Transition of Care [facility]) Screening Note   Patient Details  Name: Tammy Garrison Date of Birth: 12/04/1990   [facility] of [facility]) CM/SW Contact:    Henrietta Dine, RN Phone Number: 09/20/2022, 12:52 PM    Transition of Care Department [facility]) has reviewed patient and no TOC needs have been identified at this time. We will continue to monitor patient advancement through interdisciplinary progression rounds. If new patient transition needs arise, please place a TOC consult.

## 2022-09-20 NOTE — Discharge Summary (Signed)
Physician Discharge Summary  Tammy Garrison [id number] DOB: March 12, 1991 DOA: 09/17/2022  PCP: [facility]  Admit date: 09/17/2022 Discharge date: 09/20/2022  Admitted From: Home Disposition:  Home  Recommendations for Outpatient Follow-up:  Follow up with PCP in 1-2 weeks Follow-up with rheumatology Dr. Benjamine Mola as soon as possible.  Please call and schedule the appointment tomorrow. Start on tapering dose of prednisone 50 mg once daily for 7 days followed by 40 mg once daily for 7 days followed by 30 mg once daily for 7 days followed by 20 mg once daily for 7 days followed by 10 mg once daily for 7 days. Need to repeat inflammatory markers with rheumatology  Home Health: None Equipment/Devices: None Discharge Condition: Stable CODE STATUS: Full code Diet recommendation: Regular diet  Brief/Interim Summary:  Tammy Garrison is a 32 y.o. female with medical history significant for anal cell carcinoma, rheumatoid arthritis, obesity, ambulatory dysfunction walks with a cane at baseline, who presented to [facility] ED from home due to progressive generalized pain and joint swelling for 1 month.  Per the patient she has not seen a rheumatologist since 2021.  Was off medications.  Endorses that since her last chemotherapy she started having diffuse generalized pain and joint swelling.  Due to severe diffuse joint pain she presented initially to her hematologist oncologist, then was referred to the ED for further evaluation.   In the ED, on exam, exquisitely tender with minimal palpation of all joints.  Lab studies notable for sed rate of 123, CRP of 18.2.  EDP performed arthrocentesis on left knee, to rule out septic arthritis, the results are pending.  TRH, hospitalist service, was asked to admit.  Rheumatoid arthritis flare Diffuse joint pain and swelling: -Arthrocentesis done at the bedside in the ER on 09/18/2022. -Inflammatory markers are elevated CRP 14.8 sed rate  123. -Fluid analysis shows WBC of 12,140, no organisms suggesting inflammatory cause.  Culture: No growth till date.  Received IV Solu-Medrol upon arrival..  Currently on prednisone 50 mg daily -Remained afebrile.  CRP and sed rate: Slightly trended down. -PT/OT recommended no follow-up -Needs outpatient rheumatology follow-up with Dr. Benjamine Mola for further management of rheumatoid arthritis. -Will discharge on tapering dose of prednisone as above.  Patient comfortable going home today.   Hypokalemia: Replenished.  Magnesium: WNL  Anal squamous cell carcinoma -Follows with Dr. Burr Medico   Anemia of chronic disease -Hemoglobin is stable -No reported overt bleeding.   Thrombocytosis: Likely reactive  Generalized weakness PT OT assessment-recommend no follow-up Fall precautions  Right shoulder pain: X-ray obtained and resulted negative.  Obesity with BMI of 34: Diet modification, exercise and weight loss recommended.  Discharge Diagnoses:  Rheumatoid arthritis flare Multiple joint pain and swelling Hypokalemia History of anal squamous cell carcinoma Anemia of chronic disease Thrombocytosis Generalized weakness Right shoulder pain Obesity with BMI of 34   Discharge Instructions  Discharge Instructions     Increase activity slowly   Complete by: As directed       Allergies as of 09/20/2022       Reactions   Coconut Flavor Itching, Rash   Itchy throat        Medication List     STOP taking these medications    LORazepam 0.5 MG tablet Commonly known as: ATIVAN   megestrol 40 MG tablet Commonly known as: MEGACE   prochlorperazine 10 MG tablet Commonly known as: COMPAZINE   Regenecare HA 2 % jelly Generic drug: lidocaine  TAKE these medications    gabapentin 300 MG capsule Commonly known as: NEURONTIN Take 1 capsule (300 mg total) by mouth 3 (three) times daily.   mirtazapine 15 MG tablet Commonly known as: REMERON Take 1 tablet (15 mg total) by  mouth at bedtime.   naproxen 250 MG tablet Commonly known as: NAPROSYN Take 250 mg by mouth 2 (two) times daily as needed for moderate pain.   predniSONE 10 MG tablet Commonly known as: DELTASONE Take 5 tablets (50 mg total) by mouth daily with breakfast for 7 days, THEN 4 tablets (40 mg total) daily with breakfast for 7 days, THEN 3 tablets (30 mg total) daily with breakfast for 7 days, THEN 2 tablets (20 mg total) daily with breakfast for 7 days, THEN 1 tablet (10 mg total) daily with breakfast for 7 days. Start taking on: September 21, 2022   traMADol-acetaminophen 37.5-325 MG tablet Commonly known as: Ultracet Take 1 tablet by mouth every 6 hours as needed. What changed: reasons to take this        Allergies  Allergen Reactions   Coconut Flavor Itching and Rash    Itchy throat    Consultations: None   Procedures/Studies: DG Shoulder Right  Result Date: 09/19/2022 CLINICAL DATA:  Pain EXAM: RIGHT SHOULDER - 2 VIEW COMPARISON:  None Available. FINDINGS: There is no evidence of fracture or dislocation. There is no evidence of arthropathy or other focal bone abnormality. Soft tissues are unremarkable. IMPRESSION: Negative. Electronically Signed   By: Sammie Bench M.D.   On: 09/19/2022 15:18   MR Lumbar Spine W Wo Contrast  Result Date: 09/17/2022 CLINICAL DATA:  Incontinence, history of rectal cancer, rheumatoid arthritis EXAM: MRI LUMBAR SPINE WITHOUT AND WITH CONTRAST TECHNIQUE: Multiplanar and multiecho pulse sequences of the lumbar spine were obtained without and with intravenous contrast. CONTRAST:  66m GADAVIST GADOBUTROL 1 MMOL/ML IV SOLN COMPARISON:  No prior MRI available FINDINGS: Segmentation:  Standard. Alignment:  Trace retrolisthesis of L5 on S1. Vertebrae: Diffusely decreased marrow signal in the lower lumbar spine, which may be the sequela of prior radiation therapy. No acute fracture or or suspicious osseous lesion. No abnormal enhancement. No evidence of  discitis or osteomyelitis. Conus medullaris and cauda equina: Conus extends to the L2 level. Conus and cauda equina appear normal. Paraspinal and other soft tissues: Negative. Disc levels: T12-L1: No significant disc bulge. No spinal canal stenosis or neural foraminal narrowing. L1-L2: No significant disc bulge. No spinal canal stenosis or neural foraminal narrowing. L2-L3: No significant disc bulge. No spinal canal stenosis or neural foraminal narrowing. L3-L4: No significant disc bulge. Mild facet arthropathy. No spinal canal stenosis or neural foraminal narrowing. L4-L5: No significant disc bulge. Mild facet arthropathy. No spinal canal stenosis or neural foraminal narrowing. L5-S1: Central disc protrusion, which narrows the lateral recesses. No spinal canal stenosis or neural foraminal narrowing. IMPRESSION: 1. L5-S1 central disc protrusion, which narrows the lateral recesses, which could affect the descending S1 nerve roots. 2. No spinal canal stenosis or neural foraminal narrowing. 3. No evidence of metastatic disease in the lumbar spine. Electronically Signed   By: AMerilyn BabaM.D.   On: 09/17/2022 23:48   CT ABDOMEN PELVIS W CONTRAST  Result Date: 09/17/2022 CLINICAL DATA:  Joint swelling and abdominal pain, history of anal cancer EXAM: CT ABDOMEN AND PELVIS WITH CONTRAST TECHNIQUE: Multidetector CT imaging of the abdomen and pelvis was performed using the standard protocol following bolus administration of intravenous contrast. RADIATION DOSE REDUCTION: This exam was  performed according to the departmental dose-optimization program which includes automated exposure control, adjustment of the mA and/or kV according to patient size and/or use of iterative reconstruction technique. CONTRAST:  179m OMNIPAQUE IOHEXOL 300 MG/ML  SOLN COMPARISON:  MRI 07/12/2022, CT 05/18/2022 FINDINGS: Lower chest: Lung bases demonstrate no acute airspace disease. Borderline cardiomegaly. Hepatobiliary: No focal liver  abnormality is seen. No gallstones, gallbladder wall thickening, or biliary dilatation. Pancreas: Unremarkable. No pancreatic ductal dilatation or surrounding inflammatory changes. Spleen: Normal in size without focal abnormality. Adrenals/Urinary Tract: Adrenal glands are unremarkable. Kidneys are normal, without renal calculi, focal lesion, or hydronephrosis. Bladder is unremarkable. Stomach/Bowel: Stomach is within normal limits. Appendix appears normal. No evidence of bowel wall thickening, distention, or inflammatory changes. Vascular/Lymphatic: No significant vascular findings are present. No enlarged abdominal or pelvic lymph nodes. Reproductive: Enlarged fibroid uterus.  No adnexal mass. Other: Negative for pelvic effusion or free air Musculoskeletal: No acute or significant osseous findings. IMPRESSION: 1. No CT evidence for acute intra-abdominal or pelvic abnormality. 2. Enlarged fibroid uterus. Electronically Signed   By: KDonavan FoilM.D.   On: 09/17/2022 23:05   DG Knee Complete 4 Views Left  Result Date: 09/17/2022 CLINICAL DATA:  Joint swelling and pain EXAM: LEFT KNEE - COMPLETE 4+ VIEW COMPARISON:  None Available. FINDINGS: No fracture or malalignment. The joint spaces appear patent. Small knee effusion. No erosions. Mineralization within normal limits IMPRESSION: Small knee effusion. No acute osseous abnormality. Electronically Signed   By: KDonavan FoilM.D.   On: 09/17/2022 22:25   DG Hip Unilat W or Wo Pelvis 2-3 Views Right  Result Date: 09/17/2022 CLINICAL DATA:  Hip pain EXAM: DG HIP (WITH OR WITHOUT PELVIS) 2-3V RIGHT COMPARISON:  None Available. FINDINGS: SI joints are patent. Pubic symphysis and rami appear intact. No fracture or malalignment. Mineralization within normal limits. Joint space is patent. IMPRESSION: No acute osseous abnormality. Electronically Signed   By: KDonavan FoilM.D.   On: 09/17/2022 22:24   DG Knee Complete 4 Views Right  Result Date:  09/17/2022 CLINICAL DATA:  Right knee pain, history of rheumatoid generalized joint swelling EXAM: RIGHT KNEE - COMPLETE 4+ VIEW COMPARISON:  08/09/2017 FINDINGS: No fracture or malalignment. Moderate symmetric medial and lateral joint space narrowing progressed compared to prior radiograph. Possible small erosions at the lateral margins of the medial and lateral tibial plateau. Mild patellofemoral narrowing. Small knee effusion. IMPRESSION: 1. Progression of tricompartment arthritis with small knee effusion. Suspected small erosions along the lateral margins of the medial and lateral tibial plateau. Findings most likely related to history of rheumatoid arthritis, though infection is also in the differential. Electronically Signed   By: KDonavan FoilM.D.   On: 09/17/2022 22:23      Subjective: Patient seen and examined.  Continues to have pain in multiple joints and stiffness.  Overall improving.  Remained febrile.  Comfortable going home.    She is aware that she needs to follow-up and schedule appointment with Dr. RBenjamine Molafor rheumatoid arthritis management.  Discharge Exam: Vitals:   09/20/22 0419 09/20/22 0800  BP: (!) 122/91 (!) 132/98  Pulse: 90 73  Resp: 17 16  Temp: 97.9 F (36.6 C) 98 F (36.7 C)  SpO2: 99% 100%   Vitals:   09/19/22 0757 09/19/22 1949 09/20/22 0419 09/20/22 0800  BP: (!) 123/92 (!) 141/93 (!) 122/91 (!) 132/98  Pulse: 74 98 90 73  Resp: 16 16 17 16  $ Temp: 98.1 F (36.7 C) 97.8 F (36.6 C)  97.9 F (36.6 C) 98 F (36.7 C)  TempSrc: Oral Oral Oral Oral  SpO2: 100% 99% 99% 100%  Weight:      Height:        General: Pt is alert, awake, not in acute distress, on room air, communicating well Cardiovascular: RRR, S1/S2 +, no rubs, no gallops Respiratory: CTA bilaterally, no wheezing, no rhonchi Abdominal: Soft, NT, ND, bowel sounds + Extremities: no edema, no cyanosis    The results of significant diagnostics from this hospitalization (including imaging,  microbiology, ancillary and laboratory) are listed below for reference.     Microbiology: Recent Results (from the past 240 hour(s))  Body fluid culture w Gram Stain     Status: None (Preliminary result)   Collection Time: 09/17/22 11:37 PM   Specimen: KNEE; Body Fluid  Result Value Ref Range Status   Specimen Description   Final    KNEE Performed at [facility] [address], [place], [place] [address]    Special Requests   Final    NONE Performed at [facility], [place] [address] [place], [place] [address]    Gram Stain   Final    FEW WBC PRESENT,BOTH PMN AND MONONUCLEAR NO ORGANISMS SEEN    Culture   Final    NO GROWTH 2 DAYS Performed at [facility], [place] [address] [place], [place] [address]    Report Status PENDING  Incomplete     Labs: BNP (last 3 results) No results for input(s): "BNP" in the last 8760 hours. Basic Metabolic Panel: Recent Labs  Lab 09/17/22 2113 09/17/22 2125 09/18/22 0535 09/19/22 1058 09/20/22 0348  NA 136 139 134* 137 135  K 3.6 3.7 3.9 3.4* 3.5  CL 99 102 99 105 106  CO2 26  --  24 26 23  $ GLUCOSE 99 96 211* 123* 112*  BUN 12 12 11 15 17  $ CREATININE 0.72 0.60 0.66 0.77 0.78  CALCIUM 9.3  --  9.2 8.6* 8.8*  MG  --   --  2.2 2.0  --   PHOS  --   --  3.6  --   --    Liver Function Tests: Recent Labs  Lab 09/17/22 2113 09/18/22 0535  AST 23 21  ALT 22 21  ALKPHOS 99 90  BILITOT 0.4 0.5  PROT 8.6* 8.5*  ALBUMIN 3.1* 3.1*   No results for input(s): "LIPASE", "AMYLASE" in the last 168 hours. No results for input(s): "AMMONIA" in the last 168 hours. CBC: Recent Labs  Lab 09/17/22 2113 09/17/22 2125 09/18/22 0535 09/19/22 1058 09/20/22 0348  WBC 5.9  --  7.2 11.9* 9.5  NEUTROABS 4.2  --  6.8  --   --   HGB 10.1* 11.2* 9.9* 10.2* 9.7*  HCT 32.4* 33.0* 31.8* 32.7* 31.9*  MCV 88.8  --  89.1 88.9 90.1  PLT 517*  --  472* 493* 455*   Cardiac Enzymes: No results for input(s):  "CKTOTAL", "CKMB", "CKMBINDEX", "TROPONINI" in the last 168 hours. BNP: Invalid input(s): "POCBNP" CBG: No results for input(s): "GLUCAP" in the last 168 hours. D-Dimer No results for input(s): "DDIMER" in the last 72 hours. Hgb A1c No results for input(s): "HGBA1C" in the last 72 hours. Lipid Profile No results for input(s): "CHOL", "HDL", "LDLCALC", "TRIG", "CHOLHDL", "LDLDIRECT" in the last 72 hours. Thyroid function studies Recent Labs    09/17/22 2113  TSH 1.174   Anemia work up No results for input(s): "VITAMINB12", "FOLATE", "FERRITIN", "  TIBC", "IRON", "RETICCTPCT" in the last 72 hours. Urinalysis    Component Value Date/Time   COLORURINE STRAW (A) 05/18/2022 2336   APPEARANCEUR CLEAR 05/18/2022 2336   LABSPEC 1.005 05/18/2022 2336   PHURINE 7.0 05/18/2022 2336   GLUCOSEU NEGATIVE 05/18/2022 2336   HGBUR NEGATIVE 05/18/2022 2336   BILIRUBINUR NEGATIVE 05/18/2022 2336   KETONESUR NEGATIVE 05/18/2022 2336   PROTEINUR NEGATIVE 05/18/2022 2336   UROBILINOGEN 0.2 01/23/2014 1350   NITRITE NEGATIVE 05/18/2022 2336   LEUKOCYTESUR NEGATIVE 05/18/2022 2336   Sepsis Labs Recent Labs  Lab 09/17/22 2113 09/18/22 0535 09/19/22 1058 09/20/22 0348  WBC 5.9 7.2 11.9* 9.5   Microbiology Recent Results (from the past 240 hour(s))  Body fluid culture w Gram Stain     Status: None (Preliminary result)   Collection Time: 09/17/22 11:37 PM   Specimen: KNEE; Body Fluid  Result Value Ref Range Status   Specimen Description   Final    KNEE Performed at [facility], [place] [address], [place], [place] [address]    Special Requests   Final    NONE Performed at [facility], [place] [address] [place], [place] [address]    Gram Stain   Final    FEW WBC PRESENT,BOTH PMN AND MONONUCLEAR NO ORGANISMS SEEN    Culture   Final    NO GROWTH 2 DAYS Performed at [facility], [place] [address] [place],  [address]    Report Status PENDING   Incomplete     Time coordinating discharge: Over 30 minutes  SIGNED:   Mckinley Jewel, MD  [facility] 09/20/2022, 12:18 PM Pager   If 7PM-7AM, please contact night-coverage [email or web address]

## 2022-09-20 NOTE — Progress Notes (Signed)
Pt A/O x4, ra, denies pain. Discharge instruction were provided. All questions are answered. Belongins with patient and family/at the bedside. VS stable, IV removed and intact. Pt transferred off the unit.

## 2022-09-21 LAB — BODY FLUID CULTURE W GRAM STAIN: Culture: NO GROWTH

## 2022-09-21 LAB — PROTEIN, BODY FLUID (OTHER): Total Protein, Body Fluid Other: 6 g/dL

## 2022-09-21 LAB — URIC ACID, BODY FLUID: Uric Acid Body Fluid: 4.4 mg/dL

## 2022-09-21 LAB — GLUCOSE, BODY FLUID OTHER: Glucose, Body Fluid Other: 42 mg/dL

## 2022-10-01 ENCOUNTER — Other Ambulatory Visit (HOSPITAL_COMMUNITY): Payer: Self-pay

## 2022-10-01 ENCOUNTER — Other Ambulatory Visit: Payer: Self-pay | Admitting: Nurse Practitioner

## 2022-10-01 DIAGNOSIS — F32A Depression, unspecified: Secondary | ICD-10-CM

## 2022-10-01 DIAGNOSIS — R634 Abnormal weight loss: Secondary | ICD-10-CM

## 2022-10-01 DIAGNOSIS — M792 Neuralgia and neuritis, unspecified: Secondary | ICD-10-CM

## 2022-10-01 DIAGNOSIS — G893 Neoplasm related pain (acute) (chronic): Secondary | ICD-10-CM

## 2022-10-01 DIAGNOSIS — R630 Anorexia: Secondary | ICD-10-CM

## 2022-10-01 DIAGNOSIS — C210 Malignant neoplasm of anus, unspecified: Secondary | ICD-10-CM

## 2022-10-01 DIAGNOSIS — Z515 Encounter for palliative care: Secondary | ICD-10-CM

## 2022-10-01 MED ORDER — TRAMADOL-ACETAMINOPHEN 37.5-325 MG PO TABS
1.0000 | ORAL_TABLET | Freq: Four times a day (QID) | ORAL | 0 refills | Status: DC | PRN
  Filled 2022-10-01: qty 20, 5d supply, fill #0

## 2022-10-01 MED ORDER — GABAPENTIN 300 MG PO CAPS
300.0000 mg | ORAL_CAPSULE | Freq: Three times a day (TID) | ORAL | 2 refills | Status: DC
  Filled 2022-10-01: qty 90, 30d supply, fill #0
  Filled 2022-10-27 (×2): qty 90, 30d supply, fill #1
  Filled 2023-01-07 – 2023-01-11 (×2): qty 90, 30d supply, fill #2

## 2022-10-01 MED ORDER — MIRTAZAPINE 15 MG PO TABS
15.0000 mg | ORAL_TABLET | Freq: Every day | ORAL | 1 refills | Status: DC
  Filled 2022-10-01: qty 30, 30d supply, fill #0
  Filled 2022-10-27 (×2): qty 30, 30d supply, fill #1

## 2022-10-02 ENCOUNTER — Encounter: Payer: Self-pay | Admitting: Hematology

## 2022-10-02 ENCOUNTER — Other Ambulatory Visit (HOSPITAL_COMMUNITY): Payer: Self-pay

## 2022-10-16 NOTE — Progress Notes (Unsigned)
[facility]  [phone] [phone]   Name: Diamante Smalls Date: 10/16/2022 MRN: [id number]  DOB: 17-Jun-1991  Patient Care Team: [place] as PCP - General (Internal Medicine) [name], MD (Rheumatology) Tyson Dense, MD as Consulting Physician (Obstetrics and Gynecology) Truitt Merle, MD as Consulting Physician (Hematology and Oncology)     I connected with [name] on 10/16/22 at 12:00 PM EDT by phone and verified that I am speaking with the correct person using two identifiers.   I discussed the limitations, risks, security and privacy concerns of performing an evaluation and management service by telemedicine and the availability of in-person appointments. I also discussed with the patient that there may be a patient responsible charge related to this service. The patient expressed understanding and agreed to proceed.   Other persons participating in the visit and their role in the encounter: [place], RN   Patient's location: home  Provider's location: [facility]   Chief Complaint: follow up of symptom management    INTERVAL HISTORY: Messiah Dasmine Sayson is a 32 y.o. 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 Palliative ask to see for symptom management.   SOCIAL HISTORY:     reports that she quit smoking about 3 years ago. Her smoking use included cigarettes. She has a 1.75 pack-year smoking history. She has never used smokeless tobacco. She reports that she does not currently use alcohol. She reports that she does not currently use drugs after having used the following drugs: Marijuana.  ADVANCE DIRECTIVES:    CODE STATUS:   PAST MEDICAL HISTORY: Past Medical History:  Diagnosis Date   Anemia    Bipolar 1 disorder ([place])    Cancer ([facility])    squamous cell rectal cancer   ETOH abuse    H/O self-harm     H/O suicide attempt    x 5 - last 05/2016 - overdose Ibuprofen   Marijuana abuse    Rheumatoid arthritis ([facility])     ALLERGIES:  is allergic to coconut flavor.  MEDICATIONS:  Current Outpatient Medications  Medication Sig Dispense Refill   gabapentin (NEURONTIN) 300 MG capsule Take 1 capsule (300 mg total) by mouth 3 (three) times daily. 90 capsule 2   mirtazapine (REMERON) 15 MG tablet Take 1 tablet (15 mg total) by mouth at bedtime. 30 tablet 1   naproxen (NAPROSYN) 250 MG tablet Take 250 mg by mouth 2 (two) times daily as needed for moderate pain.     predniSONE (DELTASONE) 10 MG tablet Take 5 tablets (50 mg total) by mouth daily with breakfast for 7 days, THEN 4 tablets (40 mg total) daily with breakfast for 7 days, THEN 3 tablets (30 mg total) daily with breakfast for 7 days, THEN 2 tablets (20 mg total) daily with breakfast for 7 days, THEN 1 tablet (10 mg total) daily with breakfast for 7 days. 105 tablet 0   traMADol-acetaminophen (ULTRACET) 37.5-325 MG tablet Take 1 tablet by mouth every 6 hours as needed. 60 tablet 0   No current facility-administered medications for this visit.   Facility-Administered Medications Ordered in Other Visits  Medication Dose Route Frequency Provider Last Rate Last Admin   0.9 %  sodium chloride infusion (Manually program via Guardrails IV Fluids)  250 mL Intravenous Once Truitt Merle, MD       heparin lock flush 100 unit/mL  250 Units Intracatheter Once Truitt Merle, MD  sodium chloride flush (NS) 0.9 % injection 10 mL  10 mL Intracatheter Once Truitt Merle, MD       sodium chloride flush (NS) 0.9 % injection 10 mL  10 mL Intracatheter Once Truitt Merle, MD       sodium chloride flush (NS) 0.9 % injection 10 mL  10 mL Intracatheter Once Truitt Merle, MD        VITAL SIGNS: There were no vitals taken for this visit. There were no vitals filed for this visit.  Estimated body mass index is 34.38 kg/m as calculated from the following:   Height as of 09/18/22:  6' (1.829 m).   Weight as of 09/18/22: 253 lb 8.5 oz (115 kg).   PERFORMANCE STATUS (ECOG) : 1 - Symptomatic but completely ambulatory   IMPRESSION: I connected by phone with Ms. Randol Kern. No acute distress identified. She is taking things one day at a time. She has appointment scheduled with Rheumatologist however earliest available is not until May. She is on the waiting list. Some limited mobility due to her arthritic discomfort.   She was discharged from the hospital on 2/19 with steroid taper. She felt some relief however did not take due to confusion in instructions. She weaned herself off in over 7 days  versus instructions to taker over a 30 day period. She acknowledged instructions and will start back on remaining dosages.   Sheyanne is taking Ultracet as needed for discomfort. Some concerns with constipation. Advised to also included Senna-S in daily regimen in addition to her daily Miralax. Tolerating pain regimen. No changes at this time.   PLAN: Ultracet every 6 hours as needed Mirtazapine 15 mg mg at bedtime Gabapentin 300 mg twice daily and 600 mg at bedtime Compazine as needed for nausea  We will continue to closely monitor and assist with symptom management as needed.  I will plan to see patient back in 4-6 weeks in collaboration to other oncology appointments.    Patient expressed understanding and was in agreement with this plan. She also understands that She can call the clinic at any time with any questions, concerns, or complaints.          Any controlled substances utilized were prescribed in the context of palliative care. PDMP has been reviewed.   Time Total: 30 min   Visit consisted of counseling and education dealing with the complex and emotionally intense issues of symptom management and palliative care in the setting of serious and potentially life-threatening illness.Greater than 50%  of this time was spent counseling and coordinating care related to the  above assessment and plan.  Alda Lea, AGPCNP-BC  Palliative Medicine Team/[facility] [facility]

## 2022-10-20 ENCOUNTER — Encounter: Payer: Self-pay | Admitting: Nurse Practitioner

## 2022-10-20 ENCOUNTER — Inpatient Hospital Stay: Payer: Medicaid Other | Attending: Hematology | Admitting: Nurse Practitioner

## 2022-10-20 DIAGNOSIS — M792 Neuralgia and neuritis, unspecified: Secondary | ICD-10-CM

## 2022-10-20 DIAGNOSIS — G893 Neoplasm related pain (acute) (chronic): Secondary | ICD-10-CM

## 2022-10-20 DIAGNOSIS — Z515 Encounter for palliative care: Secondary | ICD-10-CM

## 2022-10-23 NOTE — Progress Notes (Deleted)
[place]  Telephone:(336) [phone] Fax:(336) [phone]   Name: Tammy Garrison Date: 10/23/2022 MRN: [id number]  DOB: 06/09/1991  Patient Care Team: [place] as PCP - General (Internal Medicine) [name], MD (Rheumatology) Tyson Dense, MD as Consulting Physician (Obstetrics and Gynecology) Truitt Merle, MD as Consulting Physician (Hematology and Oncology)     I connected with Tammy Garrison on 10/23/22 at 11:30 AM EDT by phone and verified that I am speaking with the correct person using two identifiers.   I discussed the limitations, risks, security and privacy concerns of performing an evaluation and management service by telemedicine and the availability of in-person appointments. I also discussed with the patient that there may be a patient responsible charge related to this service. The patient expressed understanding and agreed to proceed.   Other persons participating in the visit and their role in the encounter: Hassan Blackshire, RN   Patient's location: home  Provider's location: [facility]   Chief Complaint: follow up of symptom management    INTERVAL HISTORY: Tammy Garrison is a 32 y.o. 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 Palliative ask to see for symptom management.   SOCIAL HISTORY:     reports that she quit smoking about 3 years ago. Her smoking use included cigarettes. She has a 1.75 pack-year smoking history. She has never used smokeless tobacco. She reports that she does not currently use alcohol. She reports that she does not currently use drugs after having used the following drugs: Marijuana.  ADVANCE DIRECTIVES:    CODE STATUS:   PAST MEDICAL HISTORY: Past Medical History:  Diagnosis Date   Anemia    Bipolar 1 disorder ([place])    Cancer ([place])    squamous cell rectal cancer   ETOH abuse    H/O self-harm     H/O suicide attempt    x 5 - last 05/2016 - overdose Ibuprofen   Marijuana abuse    Rheumatoid arthritis ([facility])     ALLERGIES:  is allergic to coconut flavor.  MEDICATIONS:  Current Outpatient Medications  Medication Sig Dispense Refill   gabapentin (NEURONTIN) 300 MG capsule Take 1 capsule (300 mg total) by mouth 3 (three) times daily. 90 capsule 2   mirtazapine (REMERON) 15 MG tablet Take 1 tablet (15 mg total) by mouth at bedtime. 30 tablet 1   naproxen (NAPROSYN) 250 MG tablet Take 250 mg by mouth 2 (two) times daily as needed for moderate pain.     predniSONE (DELTASONE) 10 MG tablet Take 5 tablets (50 mg total) by mouth daily with breakfast for 7 days, THEN 4 tablets (40 mg total) daily with breakfast for 7 days, THEN 3 tablets (30 mg total) daily with breakfast for 7 days, THEN 2 tablets (20 mg total) daily with breakfast for 7 days, THEN 1 tablet (10 mg total) daily with breakfast for 7 days. 105 tablet 0   traMADol-acetaminophen (ULTRACET) 37.5-325 MG tablet Take 1 tablet by mouth every 6 hours as needed. 60 tablet 0   No current facility-administered medications for this visit.   Facility-Administered Medications Ordered in Other Visits  Medication Dose Route Frequency Provider Last Rate Last Admin   0.9 %  sodium chloride infusion (Manually program via Guardrails IV Fluids)  250 mL Intravenous Once Truitt Merle, MD       heparin lock flush 100 unit/mL  250 Units Intracatheter Once Truitt Merle, MD  sodium chloride flush (NS) 0.9 % injection 10 mL  10 mL Intracatheter Once Truitt Merle, MD       sodium chloride flush (NS) 0.9 % injection 10 mL  10 mL Intracatheter Once Truitt Merle, MD       sodium chloride flush (NS) 0.9 % injection 10 mL  10 mL Intracatheter Once Truitt Merle, MD        VITAL SIGNS: There were no vitals taken for this visit. There were no vitals filed for this visit.  Estimated body mass index is 34.38 kg/m as calculated from the following:   Height as of 09/18/22:  6' (1.829 m).   Weight as of 09/18/22: 253 lb 8.5 oz (115 kg).   PERFORMANCE STATUS (ECOG) : 1 - Symptomatic but completely ambulatory   IMPRESSION:   PLAN: Ultracet every 6 hours as needed Mirtazapine 15 mg mg at bedtime Gabapentin 300 mg twice daily and 600 mg at bedtime Compazine as needed for nausea  We will continue to closely monitor and assist with symptom management as needed.  I will plan to see patient back in 4-6 weeks in collaboration to other oncology appointments.    Patient expressed understanding and was in agreement with this plan. She also understands that She can call the clinic at any time with any questions, concerns, or complaints.          Any controlled substances utilized were prescribed in the context of palliative care. PDMP has been reviewed.   Time Total: 30 min   Visit consisted of counseling and education dealing with the complex and emotionally intense issues of symptom management and palliative care in the setting of serious and potentially life-threatening illness.Greater than 50%  of this time was spent counseling and coordinating care related to the above assessment and plan.  Alda Lea, AGPCNP-BC  Palliative Medicine Team/[facility] [place]

## 2022-10-27 ENCOUNTER — Emergency Department (HOSPITAL_COMMUNITY)
Admission: EM | Admit: 2022-10-27 | Discharge: 2022-10-27 | Disposition: A | Discharge status: Home / Self Care | Payer: Medicaid Other | Attending: Emergency Medicine | Admitting: Emergency Medicine

## 2022-10-27 ENCOUNTER — Other Ambulatory Visit: Payer: Self-pay

## 2022-10-27 ENCOUNTER — Other Ambulatory Visit (HOSPITAL_COMMUNITY): Payer: Self-pay

## 2022-10-27 ENCOUNTER — Emergency Department (HOSPITAL_COMMUNITY): Payer: Medicaid Other

## 2022-10-27 DIAGNOSIS — E876 Hypokalemia: Secondary | ICD-10-CM | POA: Diagnosis not present

## 2022-10-27 DIAGNOSIS — R000 Tachycardia, unspecified: Secondary | ICD-10-CM | POA: Insufficient documentation

## 2022-10-27 DIAGNOSIS — M7989 Other specified soft tissue disorders: Secondary | ICD-10-CM | POA: Diagnosis present

## 2022-10-27 DIAGNOSIS — M25531 Pain in right wrist: Secondary | ICD-10-CM | POA: Diagnosis not present

## 2022-10-27 DIAGNOSIS — M2550 Pain in unspecified joint: Secondary | ICD-10-CM

## 2022-10-27 DIAGNOSIS — M25561 Pain in right knee: Secondary | ICD-10-CM | POA: Diagnosis not present

## 2022-10-27 LAB — BASIC METABOLIC PANEL
Anion gap: 10 (ref 5–15)
BUN: 14 mg/dL (ref 6–20)
CO2: 25 mmol/L (ref 22–32)
Calcium: 8.8 mg/dL — ABNORMAL LOW (ref 8.9–10.3)
Chloride: 103 mmol/L (ref 98–111)
Creatinine, Ser: 0.7 mg/dL (ref 0.44–1.00)
GFR, Estimated: >60 mL/min (ref >60)
Glucose, Bld: 139 mg/dL — ABNORMAL HIGH (ref 70–99)
Potassium: 3 mmol/L — ABNORMAL LOW (ref 3.5–5.1)
Sodium: 138 mmol/L (ref 135–145)

## 2022-10-27 LAB — CBC WITH DIFFERENTIAL/PLATELET
Abs Immature Granulocytes: 0.18 10*3/uL — ABNORMAL HIGH (ref 0.00–0.07)
Basophils Absolute: 0 10*3/uL (ref 0.0–0.1)
Basophils Relative: 0 %
Eosinophils Absolute: 0.1 10*3/uL (ref 0.0–0.5)
Eosinophils Relative: 1 %
HCT: 34.6 % — ABNORMAL LOW (ref 36.0–46.0)
Hemoglobin: 10.8 g/dL — ABNORMAL LOW (ref 12.0–15.0)
Immature Granulocytes: 2 %
Lymphocytes Relative: 11 %
Lymphs Abs: 1.4 10*3/uL (ref 0.7–4.0)
MCH: 27.9 pg (ref 26.0–34.0)
MCHC: 31.2 g/dL (ref 30.0–36.0)
MCV: 89.4 fL (ref 80.0–100.0)
Monocytes Absolute: 1.2 10*3/uL — ABNORMAL HIGH (ref 0.1–1.0)
Monocytes Relative: 10 %
Neutro Abs: 9.1 10*3/uL — ABNORMAL HIGH (ref 1.7–7.7)
Neutrophils Relative %: 76 %
Platelets: 383 10*3/uL (ref 150–400)
RBC: 3.87 MIL/uL (ref 3.87–5.11)
RDW: 17.3 % — ABNORMAL HIGH (ref 11.5–15.5)
WBC: 12 10*3/uL — ABNORMAL HIGH (ref 4.0–10.5)
nRBC: 0 % (ref 0.0–0.2)

## 2022-10-27 LAB — TSH: TSH: 2.858 u[IU]/mL (ref 0.350–4.500)

## 2022-10-27 MED ORDER — DEXAMETHASONE SODIUM PHOSPHATE 10 MG/ML IJ SOLN
10.0000 mg | Freq: Once | INTRAMUSCULAR | Status: AC
  Administered 2022-10-27: 10 mg via INTRAVENOUS
  Filled 2022-10-27: qty 1

## 2022-10-27 MED ORDER — METHYLPREDNISOLONE 4 MG PO TBPK
ORAL_TABLET | ORAL | 0 refills | Status: DC
  Filled 2022-10-27: qty 21, 6d supply, fill #0

## 2022-10-27 MED ORDER — POTASSIUM CHLORIDE CRYS ER 20 MEQ PO TBCR
40.0000 meq | EXTENDED_RELEASE_TABLET | Freq: Once | ORAL | Status: AC
  Administered 2022-10-27: 40 meq via ORAL
  Filled 2022-10-27: qty 2

## 2022-10-27 MED ORDER — SODIUM CHLORIDE 0.9 % IV BOLUS
1000.0000 mL | Freq: Once | INTRAVENOUS | Status: AC
  Administered 2022-10-27: 1000 mL via INTRAVENOUS

## 2022-10-27 MED ORDER — MELOXICAM 15 MG PO TABS
15.0000 mg | ORAL_TABLET | Freq: Every day | ORAL | 0 refills | Status: DC
  Filled 2022-10-27 (×2): qty 30, 30d supply, fill #0

## 2022-10-27 NOTE — ED Triage Notes (Signed)
Pt c/o swelling and pain to R wrist and R knee. Pt has hx of fluid in knee and recently had the L knee drained. Denies injury/trauma. Pt has hx of arthritis and states that she thinks she's having a flare up. States that she completed a course of steroids a couple weeks ago. States that she has had some subjective fevers/chills, but that this may be caused from early menopause. Pt tachycardic in triage - states that she has a hx of tachycardia.

## 2022-10-27 NOTE — ED Provider Notes (Signed)
[facility] Provider Note   CSN: [id number] Arrival date & time: 10/27/22  0005     History  Chief Complaint  Patient presents with   Joint Swelling    Tammy Garrison is a 32 y.o. female.  Patient presents the emergency room complaining of swelling and pain to the right wrist and right knee.  Patient has a history of rheumatoid arthritis and believes she may be having a flareup.  She states that she knows of no injuries or trauma to the affected areas.  She endorses completing a steroid taper a few weeks ago but denies improvement in symptoms.  Patient is tachycardic upon arrival but endorses history of the same.  Patient with history of rheumatoid arthritis, marijuana dependence, anal squamous cell carcinoma, bipolar 1 disorder, obesity HPI     Home Medications Prior to Admission medications   Medication Sig Start Date End Date Taking? Authorizing Provider  meloxicam (MOBIC) 15 MG tablet Take 1 tablet (15 mg total) by mouth daily. 10/27/22  Yes Dorothyann Peng, PA-C  methylPREDNISolone (MEDROL DOSEPAK) 4 MG TBPK tablet Take as directed per package instructions 10/27/22  Yes Cherlynn June B, PA-C  gabapentin (NEURONTIN) 300 MG capsule Take 1 capsule (300 mg total) by mouth 3 (three) times daily. 10/01/22   Pickenpack-Cousar, Carlena Sax, NP  mirtazapine (REMERON) 15 MG tablet Take 1 tablet (15 mg total) by mouth at bedtime. 10/01/22   Pickenpack-Cousar, Carlena Sax, NP  naproxen (NAPROSYN) 250 MG tablet Take 250 mg by mouth 2 (two) times daily as needed for moderate pain.    [provider]  traMADol-acetaminophen (ULTRACET) 37.5-325 MG tablet Take 1 tablet by mouth every 6 hours as needed. 10/01/22   Pickenpack-Cousar, Carlena Sax, NP      Allergies    Coconut flavor    Review of Systems   Review of Systems  Musculoskeletal:  Positive for arthralgias and joint swelling.    Physical Exam Updated Vital Signs BP (!) 182/114    Pulse (!) 103   Temp 98.6 F (37 C) (Oral)   Resp 18   LMP 05/07/2022   SpO2 98%  Physical Exam HENT:     Head: Normocephalic and atraumatic.     Mouth/Throat:     Mouth: Mucous membranes are moist.  Eyes:     Conjunctiva/sclera: Conjunctivae normal.  Cardiovascular:     Rate and Rhythm: Tachycardia present.  Pulmonary:     Effort: Pulmonary effort is normal. No respiratory distress.  Musculoskeletal:        General: Swelling and tenderness present. No deformity or signs of injury.     Cervical back: Normal range of motion and neck supple.     Comments: Patient with swelling of the right wrist and right knee.  Mild warmth when compared to contralateral side.  Skin:    General: Skin is dry.  Neurological:     Mental Status: She is alert.  Psychiatric:        Speech: Speech normal.        Behavior: Behavior normal.     ED Results / Procedures / Treatments   Labs (all labs ordered are listed, but only abnormal results are displayed) Labs Reviewed  BASIC METABOLIC PANEL - Abnormal; Notable for the following components:      Result Value   Potassium 3.0 (*)    Glucose, Bld 139 (*)    Calcium 8.8 (*)    All other components within normal  limits  CBC WITH DIFFERENTIAL/PLATELET - Abnormal; Notable for the following components:   WBC 12.0 (*)    Hemoglobin 10.8 (*)    HCT 34.6 (*)    RDW 17.3 (*)    Neutro Abs 9.1 (*)    Monocytes Absolute 1.2 (*)    Abs Immature Granulocytes 0.18 (*)    All other components within normal limits  TSH    EKG None  Radiology DG Wrist Complete Right  Result Date: 10/27/2022 CLINICAL DATA:  Right wrist x-ray No injury, wrist swelling and pain for over one week, entire wrist hurts, patient states she has arthritis EXAM: RIGHT WRIST - COMPLETE 3+ VIEW COMPARISON:  None Available. FINDINGS: There is no evidence of fracture or dislocation. There is no evidence of arthropathy or other focal bone abnormality. Lateral volar wrist subcutaneus  soft tissue edema. No retained radiopaque foreign body. IMPRESSION: No acute displaced fracture or dislocation. Electronically Signed   By: Iven Finn M.D.   On: 10/27/2022 01:34    Procedures Procedures    Medications Ordered in ED Medications  sodium chloride 0.9 % bolus 1,000 mL (0 mLs Intravenous Stopped 10/27/22 0318)  dexamethasone (DECADRON) injection 10 mg (10 mg Intravenous Given 10/27/22 0148)  potassium chloride SA (KLOR-CON M) CR tablet 40 mEq (40 mEq Oral Given 10/27/22 [id number])    ED Course/ Medical Decision Making/ A&P                             Medical Decision Making Amount and/or Complexity of Data Reviewed Labs: ordered. Radiology: ordered.  Risk Prescription drug management.   Patient presents with chief complaint of right-sided knee and wrist swelling.  Differential diagnosis includes rheumatoid arthritis flare osteoarthritis, injury, fracture, dislocation, septic arthritis, and others  Reviewed the patient's past medical history.  She is comorbidities including known diagnosis of rheumatoid arthritis, history of substance abuse.  Reviewed recent medical records including a discharge summary from February 18.  Patient had been admitted due to rheumatoid arthritis flare with diffuse joint pain and swelling.  Recommendation included outpatient rheumatology follow-up, prednisone taper.  I have ordered and reviewed labs.  Pertinent results include: Normal TSH, WBC 12.0 (consistent with previous lab work), potassium 3.0  I ordered and personally interpreted imaging including plain films of the right wrist.  No acute osseous abnormality noted.  Mild soft tissue edema noted.  I agree with the radiologist findings  I ordered the patient Decadron for inflammation, normal saline for possible dehydration, potassium for hypokalemia.  Upon reassessment the patient was less tachycardic.  With symptoms occurring into joint simultaneously I feel there is an extremely low  likelihood of a septic joint.  Patient with known rheumatoid arthritis.  Unfortunately I feel that treatment options from the emergency side are limited.  Plan to discharge patient home with steroid taper and meloxicam prescription.  Patient will need follow-up with rheumatology for further evaluation and management as appropriate.  Patient's tachycardia improved significantly with fluid administration, question mild dehydration.  TSH was normal.  Patient was mildly hypokalemic and  was given potassium for correction.       Final Clinical Impression(s) / ED Diagnoses Final diagnoses:  Arthralgia, unspecified joint  Hypokalemia    Rx / DC Orders ED Discharge Orders          Ordered    methylPREDNISolone (MEDROL DOSEPAK) 4 MG TBPK tablet        10/27/22 0341  meloxicam (MOBIC) 15 MG tablet  Daily        10/27/22 0341              Ronny Bacon 10/27/22 0344    Shanon Rosser, MD 10/27/22 [phone]

## 2022-10-27 NOTE — Discharge Instructions (Signed)
You were evaluated tonight for joint pain and swelling. This is likely due to your underlying rheumatoid arthritis. I have prescribed a steroid taper and anti-inflammatory medications. Do not take other NSAID medications while taking Meloxicam. Please follow up with rheumatology and your primary care provider.

## 2022-10-28 ENCOUNTER — Inpatient Hospital Stay: Payer: Medicaid Other | Admitting: Nurse Practitioner

## 2022-11-29 ENCOUNTER — Encounter (HOSPITAL_COMMUNITY): Payer: Self-pay | Admitting: Internal Medicine

## 2022-11-29 ENCOUNTER — Inpatient Hospital Stay (HOSPITAL_COMMUNITY)
Admission: EM | Admit: 2022-11-29 | Discharge: 2022-12-01 | DRG: 547 | Disposition: A | Discharge status: Home / Self Care | Payer: Medicaid Other | Attending: Family Medicine | Admitting: Family Medicine

## 2022-11-29 ENCOUNTER — Other Ambulatory Visit: Payer: Self-pay

## 2022-11-29 ENCOUNTER — Emergency Department (HOSPITAL_COMMUNITY): Payer: Medicaid Other

## 2022-11-29 DIAGNOSIS — E669 Obesity, unspecified: Secondary | ICD-10-CM | POA: Diagnosis present

## 2022-11-29 DIAGNOSIS — Z6834 Body mass index (BMI) 34.0-34.9, adult: Secondary | ICD-10-CM

## 2022-11-29 DIAGNOSIS — M2540 Effusion, unspecified joint: Secondary | ICD-10-CM | POA: Diagnosis present

## 2022-11-29 DIAGNOSIS — Z79899 Other long term (current) drug therapy: Secondary | ICD-10-CM

## 2022-11-29 DIAGNOSIS — F32A Depression, unspecified: Secondary | ICD-10-CM | POA: Diagnosis present

## 2022-11-29 DIAGNOSIS — F319 Bipolar disorder, unspecified: Secondary | ICD-10-CM | POA: Diagnosis present

## 2022-11-29 DIAGNOSIS — F419 Anxiety disorder, unspecified: Secondary | ICD-10-CM | POA: Diagnosis present

## 2022-11-29 DIAGNOSIS — Z87891 Personal history of nicotine dependence: Secondary | ICD-10-CM

## 2022-11-29 DIAGNOSIS — M069 Rheumatoid arthritis, unspecified: Secondary | ICD-10-CM | POA: Diagnosis not present

## 2022-11-29 DIAGNOSIS — E875 Hyperkalemia: Secondary | ICD-10-CM | POA: Diagnosis present

## 2022-11-29 DIAGNOSIS — Z9221 Personal history of antineoplastic chemotherapy: Secondary | ICD-10-CM

## 2022-11-29 DIAGNOSIS — Z923 Personal history of irradiation: Secondary | ICD-10-CM

## 2022-11-29 DIAGNOSIS — Z832 Family history of diseases of the blood and blood-forming organs and certain disorders involving the immune mechanism: Secondary | ICD-10-CM

## 2022-11-29 DIAGNOSIS — G8929 Other chronic pain: Secondary | ICD-10-CM | POA: Diagnosis present

## 2022-11-29 DIAGNOSIS — M2550 Pain in unspecified joint: Principal | ICD-10-CM

## 2022-11-29 DIAGNOSIS — Z85048 Personal history of other malignant neoplasm of rectum, rectosigmoid junction, and anus: Secondary | ICD-10-CM

## 2022-11-29 DIAGNOSIS — Z791 Long term (current) use of non-steroidal anti-inflammatories (NSAID): Secondary | ICD-10-CM

## 2022-11-29 DIAGNOSIS — Z8249 Family history of ischemic heart disease and other diseases of the circulatory system: Secondary | ICD-10-CM

## 2022-11-29 DIAGNOSIS — D500 Iron deficiency anemia secondary to blood loss (chronic): Secondary | ICD-10-CM | POA: Diagnosis present

## 2022-11-29 LAB — RAPID URINE DRUG SCREEN, HOSP PERFORMED
Amphetamines: NOT DETECTED
Barbiturates: NOT DETECTED
Benzodiazepines: NOT DETECTED
Cocaine: NOT DETECTED
Opiates: NOT DETECTED
Tetrahydrocannabinol: POSITIVE — AB

## 2022-11-29 LAB — URINALYSIS, ROUTINE W REFLEX MICROSCOPIC
Bacteria, UA: NONE SEEN
Bilirubin Urine: NEGATIVE
Glucose, UA: NEGATIVE mg/dL
Hgb urine dipstick: NEGATIVE
Ketones, ur: NEGATIVE mg/dL
Nitrite: NEGATIVE
Protein, ur: NEGATIVE mg/dL
Specific Gravity, Urine: 1.023 (ref 1.005–1.030)
pH: 5 (ref 5.0–8.0)

## 2022-11-29 LAB — CBC WITH DIFFERENTIAL/PLATELET
Abs Immature Granulocytes: 0.06 10*3/uL (ref 0.00–0.07)
Basophils Absolute: 0 10*3/uL (ref 0.0–0.1)
Basophils Relative: 0 %
Eosinophils Absolute: 0.1 10*3/uL (ref 0.0–0.5)
Eosinophils Relative: 2 %
HCT: 32.6 % — ABNORMAL LOW (ref 36.0–46.0)
Hemoglobin: 10 g/dL — ABNORMAL LOW (ref 12.0–15.0)
Immature Granulocytes: 1 %
Lymphocytes Relative: 10 %
Lymphs Abs: 0.8 10*3/uL (ref 0.7–4.0)
MCH: 26.8 pg (ref 26.0–34.0)
MCHC: 30.7 g/dL (ref 30.0–36.0)
MCV: 87.4 fL (ref 80.0–100.0)
Monocytes Absolute: 0.8 10*3/uL (ref 0.1–1.0)
Monocytes Relative: 11 %
Neutro Abs: 6 10*3/uL (ref 1.7–7.7)
Neutrophils Relative %: 76 %
Platelets: 527 10*3/uL — ABNORMAL HIGH (ref 150–400)
RBC: 3.73 MIL/uL — ABNORMAL LOW (ref 3.87–5.11)
RDW: 17.4 % — ABNORMAL HIGH (ref 11.5–15.5)
WBC: 7.9 10*3/uL (ref 4.0–10.5)
nRBC: 0 % (ref 0.0–0.2)

## 2022-11-29 LAB — COMPREHENSIVE METABOLIC PANEL
ALT: 16 U/L (ref 0–44)
AST: 19 U/L (ref 15–41)
Albumin: 2.9 g/dL — ABNORMAL LOW (ref 3.5–5.0)
Alkaline Phosphatase: 89 U/L (ref 38–126)
Anion gap: 13 (ref 5–15)
BUN: 11 mg/dL (ref 6–20)
CO2: 21 mmol/L — ABNORMAL LOW (ref 22–32)
Calcium: 9.1 mg/dL (ref 8.9–10.3)
Chloride: 103 mmol/L (ref 98–111)
Creatinine, Ser: 0.68 mg/dL (ref 0.44–1.00)
GFR, Estimated: >60 mL/min (ref >60)
Glucose, Bld: 101 mg/dL — ABNORMAL HIGH (ref 70–99)
Potassium: 5.3 mmol/L — ABNORMAL HIGH (ref 3.5–5.1)
Sodium: 137 mmol/L (ref 135–145)
Total Bilirubin: 2 mg/dL — ABNORMAL HIGH (ref 0.3–1.2)
Total Protein: 8.7 g/dL — ABNORMAL HIGH (ref 6.5–8.1)

## 2022-11-29 LAB — CBC
HCT: 32.6 % — ABNORMAL LOW (ref 36.0–46.0)
Hemoglobin: 10.1 g/dL — ABNORMAL LOW (ref 12.0–15.0)
MCH: 26.9 pg (ref 26.0–34.0)
MCHC: 31 g/dL (ref 30.0–36.0)
MCV: 86.9 fL (ref 80.0–100.0)
Platelets: 612 10*3/uL — ABNORMAL HIGH (ref 150–400)
RBC: 3.75 MIL/uL — ABNORMAL LOW (ref 3.87–5.11)
RDW: 17.2 % — ABNORMAL HIGH (ref 11.5–15.5)
WBC: 11.4 10*3/uL — ABNORMAL HIGH (ref 4.0–10.5)
nRBC: 0 % (ref 0.0–0.2)

## 2022-11-29 LAB — C-REACTIVE PROTEIN: CRP: 21 mg/dL — ABNORMAL HIGH (ref <1.0)

## 2022-11-29 LAB — CREATININE, SERUM
Creatinine, Ser: 0.74 mg/dL (ref 0.44–1.00)
GFR, Estimated: >60 mL/min (ref >60)

## 2022-11-29 LAB — MAGNESIUM: Magnesium: 2.1 mg/dL (ref 1.7–2.4)

## 2022-11-29 LAB — I-STAT BETA HCG BLOOD, ED (MC, WL, AP ONLY): I-stat hCG, quantitative: <5 m[IU]/mL (ref <5)

## 2022-11-29 LAB — SEDIMENTATION RATE: Sed Rate: 130 mm/hr — ABNORMAL HIGH (ref 0–22)

## 2022-11-29 LAB — ETHANOL: Alcohol, Ethyl (B): <10 mg/dL (ref <10)

## 2022-11-29 MED ORDER — HYDROMORPHONE HCL 1 MG/ML IJ SOLN
0.5000 mg | INTRAMUSCULAR | Status: DC | PRN
  Administered 2022-11-29 – 2022-11-30 (×5): 0.5 mg via INTRAVENOUS
  Filled 2022-11-29 (×5): qty 0.5

## 2022-11-29 MED ORDER — ACETAMINOPHEN 325 MG PO TABS: 650.0000 mg | ORAL_TABLET | Freq: Four times a day (QID) | ORAL | Status: DC | PRN

## 2022-11-29 MED ORDER — TRAMADOL-ACETAMINOPHEN 37.5-325 MG PO TABS: 1.0000 | ORAL_TABLET | Freq: Four times a day (QID) | ORAL | Status: DC | PRN

## 2022-11-29 MED ORDER — SODIUM CHLORIDE 0.9 % IV SOLN: INTRAVENOUS | Status: DC

## 2022-11-29 MED ORDER — OXYCODONE HCL 5 MG PO TABS
5.0000 mg | ORAL_TABLET | ORAL | Status: DC | PRN
  Administered 2022-11-29 – 2022-11-30 (×3): 5 mg via ORAL
  Filled 2022-11-29 (×3): qty 1

## 2022-11-29 MED ORDER — ACETAMINOPHEN 650 MG RE SUPP: 650.0000 mg | Freq: Four times a day (QID) | RECTAL | Status: DC | PRN

## 2022-11-29 MED ORDER — METHYLPREDNISOLONE SODIUM SUCC 40 MG IJ SOLR
40.0000 mg | INTRAMUSCULAR | Status: DC
  Administered 2022-11-29 – 2022-11-30 (×2): 40 mg via INTRAVENOUS
  Filled 2022-11-29 (×2): qty 1

## 2022-11-29 MED ORDER — ALBUTEROL SULFATE (2.5 MG/3ML) 0.083% IN NEBU: 2.5000 mg | INHALATION_SOLUTION | RESPIRATORY_TRACT | Status: DC | PRN

## 2022-11-29 MED ORDER — OXYCODONE-ACETAMINOPHEN 5-325 MG PO TABS
2.0000 | ORAL_TABLET | Freq: Once | ORAL | Status: AC
  Administered 2022-11-29: 2 via ORAL
  Filled 2022-11-29: qty 2

## 2022-11-29 MED ORDER — SODIUM ZIRCONIUM CYCLOSILICATE 5 G PO PACK
5.0000 g | PACK | Freq: Once | ORAL | Status: AC
  Administered 2022-11-29: 5 g via ORAL
  Filled 2022-11-29: qty 1

## 2022-11-29 MED ORDER — ENOXAPARIN SODIUM 40 MG/0.4ML IJ SOSY
40.0000 mg | PREFILLED_SYRINGE | INTRAMUSCULAR | Status: DC
  Administered 2022-11-29 – 2022-11-30 (×2): 40 mg via SUBCUTANEOUS
  Filled 2022-11-29 (×2): qty 0.4

## 2022-11-29 MED ORDER — NAPROXEN 500 MG PO TABS: 250.0000 mg | ORAL_TABLET | Freq: Two times a day (BID) | ORAL | Status: DC | PRN

## 2022-11-29 MED ORDER — KETOROLAC TROMETHAMINE 15 MG/ML IJ SOLN
15.0000 mg | Freq: Once | INTRAMUSCULAR | Status: AC
  Administered 2022-11-29: 15 mg via INTRAVENOUS
  Filled 2022-11-29: qty 1

## 2022-11-29 MED ORDER — METHOCARBAMOL 500 MG PO TABS
1000.0000 mg | ORAL_TABLET | Freq: Once | ORAL | Status: AC
  Administered 2022-11-29: 1000 mg via ORAL
  Filled 2022-11-29: qty 2

## 2022-11-29 MED ORDER — HYDROMORPHONE HCL 1 MG/ML IJ SOLN
0.5000 mg | Freq: Once | INTRAMUSCULAR | Status: AC
  Administered 2022-11-29: 0.5 mg via INTRAVENOUS
  Filled 2022-11-29: qty 1

## 2022-11-29 MED ORDER — POLYETHYLENE GLYCOL 3350 17 G PO PACK: 17.0000 g | PACK | Freq: Every day | ORAL | Status: DC

## 2022-11-29 MED ORDER — POLYETHYLENE GLYCOL 3350 17 G PO PACK
17.0000 g | PACK | Freq: Every day | ORAL | Status: DC | PRN
  Administered 2022-12-01: 17 g via ORAL
  Filled 2022-11-29: qty 1

## 2022-11-29 MED ORDER — LACTATED RINGERS IV BOLUS
1000.0000 mL | Freq: Once | INTRAVENOUS | Status: AC
  Administered 2022-11-29: 1000 mL via INTRAVENOUS

## 2022-11-29 MED ORDER — MIRTAZAPINE 15 MG PO TABS
15.0000 mg | ORAL_TABLET | Freq: Every day | ORAL | Status: DC
  Administered 2022-11-29 – 2022-11-30 (×2): 15 mg via ORAL
  Filled 2022-11-29 (×2): qty 1

## 2022-11-29 MED ORDER — ONDANSETRON HCL 4 MG/2ML IJ SOLN: 4.0000 mg | Freq: Four times a day (QID) | INTRAMUSCULAR | Status: DC | PRN

## 2022-11-29 MED ORDER — ONDANSETRON HCL 4 MG PO TABS: 4.0000 mg | ORAL_TABLET | Freq: Four times a day (QID) | ORAL | Status: DC | PRN

## 2022-11-29 MED ORDER — TRAZODONE HCL 50 MG PO TABS
25.0000 mg | ORAL_TABLET | Freq: Every evening | ORAL | Status: DC | PRN
  Administered 2022-11-29 – 2022-11-30 (×2): 25 mg via ORAL
  Filled 2022-11-29 (×2): qty 1

## 2022-11-29 MED ORDER — METOPROLOL TARTRATE 5 MG/5ML IV SOLN: 5.0000 mg | Freq: Four times a day (QID) | INTRAVENOUS | Status: DC | PRN

## 2022-11-29 MED ORDER — GABAPENTIN 300 MG PO CAPS
300.0000 mg | ORAL_CAPSULE | Freq: Three times a day (TID) | ORAL | Status: DC
  Administered 2022-11-29 – 2022-12-01 (×6): 300 mg via ORAL
  Filled 2022-11-29 (×6): qty 1

## 2022-11-29 MED ORDER — DOCUSATE SODIUM 100 MG PO CAPS
100.0000 mg | ORAL_CAPSULE | Freq: Two times a day (BID) | ORAL | Status: DC
  Administered 2022-11-29 – 2022-12-01 (×4): 100 mg via ORAL
  Filled 2022-11-29 (×4): qty 1

## 2022-11-29 MED ORDER — POLYETHYLENE GLYCOL 3350 17 G PO PACK
17.0000 g | PACK | Freq: Once | ORAL | Status: AC
  Administered 2022-11-29: 17 g via ORAL
  Filled 2022-11-29: qty 1

## 2022-11-29 MED ORDER — METHYLPREDNISOLONE SODIUM SUCC 125 MG IJ SOLR
125.0000 mg | Freq: Once | INTRAMUSCULAR | Status: AC
  Administered 2022-11-29: 125 mg via INTRAVENOUS
  Filled 2022-11-29: qty 2

## 2022-11-29 NOTE — ED Notes (Signed)
ED TO INPATIENT HANDOFF REPORT  ED Nurse Name and Phone #: Dessa Phi  S Name/Age/Gender Tammy Garrison 32 y.o. female Room/Bed: WA09/WA09  Code Status   Code Status: Full Code  Home/SNF/Other Home Patient oriented to: self, place, time, and situation Is this baseline? Yes   Triage Complete: Triage complete  Chief Complaint Rheumatoid arthritis flare (HCC) [M06.9]  Triage Note Pt states her chronic pain started after her radiation treatment for squamous cell cancer. Her pain has gotten so bad that nothing she takes works for the pain anymore. She usually takes gabapentin and tramadol but these medications no longer work for her pain. Pt is coming from home and arrived via EMS. EMS administered of fentanyl.  BP 151/97 HR 103 O2 99 RR 24   Allergies Allergies  Allergen Reactions   Coconut Flavor Itching and Rash    Itchy throat    Level of Care/Admitting Diagnosis ED Disposition     ED Disposition  Admit   Condition  --   Comment  Hospital Area: [facility] [[id number]]  Level of Care: Med-Surg [16]  May place patient in observation at [facility] or [facility] if equivalent level of care is available:: Yes  Covid Evaluation: Asymptomatic - no recent exposure (last 10 days) testing not required  Diagnosis: Rheumatoid arthritis flare [facility]) [[id number]]  Admitting Physician: Maryln Gottron [[id number]]  Attending Physician: Kirby Crigler, MIR M [[id number]]          B Medical/Surgery History Past Medical History:  Diagnosis Date   Anemia    Bipolar 1 disorder (HCC)    Cancer (HCC)    squamous cell rectal cancer   ETOH abuse    H/O self-harm    H/O suicide attempt    x 5 - last 05/2016 - overdose Ibuprofen   Marijuana abuse    Rheumatoid arthritis (HCC)    Past Surgical History:  Procedure Laterality Date   HEMORRHOID SURGERY N/A 02/08/2022   Procedure: EXTERNAL AND INTERNAL HEMORRHOIDECTOMY;  Surgeon: Violeta Gelinas,  MD;  Location: [facility] OR;  Service: General;  Laterality: N/A;   INCISION AND DRAINAGE ABSCESS Right 04/22/2022   Procedure: INCISION AND DRAINAGE axilla;  Surgeon: Kinsinger, De Blanch, MD;  Location: [facility] ORS;  Service: General;  Laterality: Right;   RECTAL EXAM UNDER ANESTHESIA N/A 07/21/2022   Procedure: RECTAL EXAM UNDER ANESTHESIA;  Surgeon: Andria Meuse, MD;  Location: [facility] OR;  Service: General;  Laterality: N/A;   TRANSANAL HEMORRHOIDAL DEARTERIALIZATION N/A 07/21/2022   Procedure: ANAL CANAL BIOPSY;  Surgeon: Andria Meuse, MD;  Location: [facility] OR;  Service: General;  Laterality: N/A;     A IV Location/Drains/Wounds Patient Lines/Drains/Airways Status     Active Line/Drains/Airways     Name Placement date Placement time Site Days   Peripheral IV 11/29/22 20 G Right Antecubital 11/29/22  0847  Antecubital  less than 1            Intake/Output Last 24 hours No intake or output data in the 24 hours ending 11/29/22 1509  Labs/Imaging Results for orders placed or performed during the hospital encounter of 11/29/22 (from the past 48 hour(s))  Comprehensive metabolic panel     Status: Abnormal   Collection Time: 11/29/22  9:06 AM  Result Value Ref Range   Sodium 137 135 - 145 mmol/L   Potassium 5.3 (H) 3.5 - 5.1 mmol/L    Comment: MODERATE HEMOLYSIS   Chloride 103 98 - 111 mmol/L   CO2  21 (L) 22 - 32 mmol/L   Glucose, Bld 101 (H) 70 - 99 mg/dL    Comment: Glucose reference range applies only to samples taken after fasting for at least 8 hours.   BUN 11 6 - 20 mg/dL   Creatinine, Ser 1.61 0.44 - 1.00 mg/dL   Calcium 9.1 8.9 - 09.6 mg/dL   Total Protein 8.7 (H) 6.5 - 8.1 g/dL   Albumin 2.9 (L) 3.5 - 5.0 g/dL   AST 19 15 - 41 U/L    Comment: HEMOLYSIS AT THIS LEVEL MAY AFFECT RESULT   ALT 16 0 - 44 U/L    Comment: HEMOLYSIS AT THIS LEVEL MAY AFFECT RESULT   Alkaline Phosphatase 89 38 - 126 U/L    Comment: HEMOLYSIS AT THIS LEVEL MAY AFFECT RESULT   Total  Bilirubin 2.0 (H) 0.3 - 1.2 mg/dL    Comment: HEMOLYSIS AT THIS LEVEL MAY AFFECT RESULT   GFR, Estimated >60 >60 mL/min    Comment: (NOTE) Calculated using the CKD-EPI Creatinine Equation (2021)    Anion gap 13 5 - 15    Comment: Performed at [facility], [address] [place], [place] [address]  Ethanol     Status: None   Collection Time: 11/29/22  9:06 AM  Result Value Ref Range   Alcohol, Ethyl (B) <10 <10 mg/dL    Comment: (NOTE) Lowest detectable limit for serum alcohol is 10 mg/dL.  For medical purposes only. Performed at [facility], [address], [place], [place] [address]   Urine rapid drug screen (hosp performed)     Status: Abnormal   Collection Time: 11/29/22  9:06 AM  Result Value Ref Range   Opiates NONE DETECTED NONE DETECTED   Cocaine NONE DETECTED NONE DETECTED   Benzodiazepines NONE DETECTED NONE DETECTED   Amphetamines NONE DETECTED NONE DETECTED   Tetrahydrocannabinol POSITIVE (A) NONE DETECTED   Barbiturates NONE DETECTED NONE DETECTED    Comment: (NOTE) DRUG SCREEN FOR MEDICAL PURPOSES ONLY.  IF CONFIRMATION IS NEEDED FOR ANY PURPOSE, NOTIFY LAB WITHIN 5 DAYS.  LOWEST DETECTABLE LIMITS FOR URINE DRUG SCREEN Drug Class                     Cutoff (ng/mL) Amphetamine and metabolites    1000 Barbiturate and metabolites    200 Benzodiazepine                 200 Opiates and metabolites        300 Cocaine and metabolites        300 THC                            50 Performed at [facility], [address], [place], [place] [address]   CBC with Diff     Status: Abnormal   Collection Time: 11/29/22  9:06 AM  Result Value Ref Range   WBC 7.9 4.0 - 10.5 K/uL   RBC 3.73 (L) 3.87 - 5.11 MIL/uL   Hemoglobin 10.0 (L) 12.0 - 15.0 g/dL   HCT 95.6 (L) 21.3 - 08.6 %   MCV 87.4 80.0 - 100.0 fL   MCH 26.8 26.0 - 34.0 pg   MCHC 30.7 30.0 - 36.0 g/dL   RDW 57.8 (H) 46.9 - 62.9 %   Platelets 527 (H)  150 - 400 K/uL    Comment: CONSISTENT WITH PREVIOUS RESULT REPEATED TO VERIFY  nRBC 0.0 0.0 - 0.2 %   Neutrophils Relative % 76 %   Neutro Abs 6.0 1.7 - 7.7 K/uL   Lymphocytes Relative 10 %   Lymphs Abs 0.8 0.7 - 4.0 K/uL   Monocytes Relative 11 %   Monocytes Absolute 0.8 0.1 - 1.0 K/uL   Eosinophils Relative 2 %   Eosinophils Absolute 0.1 0.0 - 0.5 K/uL   Basophils Relative 0 %   Basophils Absolute 0.0 0.0 - 0.1 K/uL   Immature Granulocytes 1 %   Abs Immature Granulocytes 0.06 0.00 - 0.07 K/uL    Comment: Performed at [facility], [address], [place], [place] [address]  Magnesium     Status: None   Collection Time: 11/29/22  9:06 AM  Result Value Ref Range   Magnesium 2.1 1.7 - 2.4 mg/dL    Comment: HEMOLYSIS AT THIS LEVEL MAY AFFECT RESULT Performed at [facility], [address] [place], [place] [address]   Urinalysis, Routine w reflex microscopic -Urine, Clean Catch     Status: Abnormal   Collection Time: 11/29/22  9:06 AM  Result Value Ref Range   Color, Urine YELLOW YELLOW   APPearance HAZY (A) CLEAR   Specific Gravity, Urine 1.023 1.005 - 1.030   pH 5.0 5.0 - 8.0   Glucose, UA NEGATIVE NEGATIVE mg/dL   Hgb urine dipstick NEGATIVE NEGATIVE   Bilirubin Urine NEGATIVE NEGATIVE   Ketones, ur NEGATIVE NEGATIVE mg/dL   Protein, ur NEGATIVE NEGATIVE mg/dL   Nitrite NEGATIVE NEGATIVE   Leukocytes,Ua TRACE (A) NEGATIVE   RBC / HPF 6-10 0 - 5 RBC/hpf   WBC, UA 11-20 0 - 5 WBC/hpf   Bacteria, UA NONE SEEN NONE SEEN   Squamous Epithelial / HPF 0-5 0 - 5 /HPF   Mucus PRESENT     Comment: Performed at [facility], [address], [place], [place] [address]  C-reactive protein     Status: Abnormal   Collection Time: 11/29/22  9:07 AM  Result Value Ref Range   CRP 21.0 (H) <1.0 mg/dL    Comment: Performed at [facility], [address] [place], [place] [address]  I-Stat beta hCG blood, ED     Status:  None   Collection Time: 11/29/22 10:07 AM  Result Value Ref Range   I-stat hCG, quantitative <5.0 <5 mIU/mL   Comment 3            Comment:   GEST. AGE      CONC.  (mIU/mL)   <=1 WEEK        5 - 50     2 WEEKS       50 - 500     3 WEEKS       100 - 10,000     4 WEEKS     1,000 - 30,000        FEMALE AND NON-PREGNANT FEMALE:     LESS THAN 5 mIU/mL   Sedimentation rate     Status: Abnormal   Collection Time: 11/29/22 12:18 PM  Result Value Ref Range   Sed Rate 130 (H) 0 - 22 mm/hr    Comment: Performed at [facility], [address] [place], [place] [address]   DG Chest Port 1 View  Result Date: 11/29/2022 CLINICAL DATA:  Chronic back pain. Undergoing radiation therapy for squamous cell cancer. EXAM: PORTABLE CHEST 1 VIEW COMPARISON:  Radiographs 04/20/2022 and 08/14/2020. CT 03/31/2022. PET-CT  07/08/2022. FINDINGS: 0924 hours. The heart size and mediastinal contours are stable for AP portable technique. The lungs appear clear. There is no pleural effusion or pneumothorax. No acute osseous findings are evident. Telemetry leads overlie the chest. IMPRESSION: Stable radiographic appearance of the chest. No evidence of active cardiopulmonary process. Electronically Signed   By: Carey Bullocks M.D.   On: 11/29/2022 09:59    Pending Labs Unresulted Labs (From admission, onward)     Start     Ordered   12/06/22 0500  Creatinine, serum  (enoxaparin (LOVENOX)    CrCl >/= 30 ml/min)  Weekly,   R     Comments: while on enoxaparin therapy    11/29/22 1453   11/30/22 0500  Basic metabolic panel  Tomorrow morning,   R        11/29/22 1453   11/30/22 0500  CBC  Tomorrow morning,   R        11/29/22 1453   11/29/22 1452  CBC  (enoxaparin (LOVENOX)    CrCl >/= 30 ml/min)  Once,   R       Comments: Baseline for enoxaparin therapy IF NOT ALREADY DRAWN.  Notify MD if PLT < 100 K.    11/29/22 1453   11/29/22 1452  Creatinine, serum  (enoxaparin (LOVENOX)    CrCl >/= 30 ml/min)   Once,   R       Comments: Baseline for enoxaparin therapy IF NOT ALREADY DRAWN.    11/29/22 1453            Vitals/Pain Today's Vitals   11/29/22 1302 11/29/22 1330 11/29/22 1400 11/29/22 1453  BP:  (!) 130/90 (!) 143/95 (!) 140/99  Pulse:  99 (!) 103 (!) 103  Resp:  18 (!) 22 17  Temp:   98.1 F (36.7 C) 98.7 F (37.1 C)  TempSrc:   Oral Oral  SpO2:  98% 98% 97%  Height:      PainSc: 10-Worst pain ever       Isolation Precautions No active isolations  Medications Medications  HYDROmorphone (DILAUDID) injection 0.5 mg (has no administration in time range)  methylPREDNISolone sodium succinate (SOLU-MEDROL) 40 mg/mL injection 40 mg (has no administration in time range)  naproxen (NAPROSYN) tablet 250 mg (has no administration in time range)  mirtazapine (REMERON) tablet 15 mg (has no administration in time range)  gabapentin (NEURONTIN) capsule 300 mg (has no administration in time range)  enoxaparin (LOVENOX) injection 40 mg (has no administration in time range)  0.9 %  sodium chloride infusion (has no administration in time range)  acetaminophen (TYLENOL) tablet 650 mg (has no administration in time range)    Or  acetaminophen (TYLENOL) suppository 650 mg (has no administration in time range)  oxyCODONE (Oxy IR/ROXICODONE) immediate release tablet 5 mg (has no administration in time range)  traZODone (DESYREL) tablet 25 mg (has no administration in time range)  docusate sodium (COLACE) capsule 100 mg (has no administration in time range)  polyethylene glycol (MIRALAX / GLYCOLAX) packet 17 g (has no administration in time range)  ondansetron (ZOFRAN) tablet 4 mg (has no administration in time range)    Or  ondansetron (ZOFRAN) injection 4 mg (has no administration in time range)  albuterol (PROVENTIL) (2.5 MG/3ML) 0.083% nebulizer solution 2.5 mg (has no administration in time range)  metoprolol tartrate (LOPRESSOR) injection 5 mg (has no administration in time range)   lactated ringers bolus 1,000 mL (0 mLs Intravenous Stopped 11/29/22 1121)  methylPREDNISolone sodium succinate (  SOLU-MEDROL) 125 mg/2 mL injection 125 mg (125 mg Intravenous Given 11/29/22 0926)  oxyCODONE-acetaminophen (PERCOCET/ROXICET) 5-325 MG per tablet 2 tablet (2 tablets Oral Given 11/29/22 0926)  methocarbamol (ROBAXIN) tablet 1,000 mg (1,000 mg Oral Given 11/29/22 1121)  HYDROmorphone (DILAUDID) injection 0.5 mg (0.5 mg Intravenous Given 11/29/22 1212)  ketorolac (TORADOL) 15 MG/ML injection 15 mg (15 mg Intravenous Given 11/29/22 1346)    Mobility walks with person assist     Focused Assessments Cardiac Assessment Handoff:    No results found for: "CKTOTAL", "CKMB", "CKMBINDEX", "TROPONINI" No results found for: "DDIMER" Does the Patient currently have chest pain? No    R Recommendations: See Admitting Provider Note  Report given to:   Additional Notes:

## 2022-11-29 NOTE — ED Triage Notes (Signed)
Pt states her chronic pain started after her radiation treatment for squamous cell cancer. Her pain has gotten so bad that nothing she takes works for the pain anymore. She usually takes gabapentin and tramadol but these medications no longer work for her pain. Pt is coming from home and arrived via EMS. EMS administered of fentanyl.  BP 151/97 HR 103 O2 99 RR 24

## 2022-11-29 NOTE — H&P (Signed)
History and Physical  Tammy Garrison ZOX:[id number] DOB: 04/25/1991 DOA: 11/29/2022  PCP: [facility]   Chief Complaint: Diffuse joint pain  HPI: Tammy Garrison is a 32 y.o. female with medical history significant for rheumatoid arthritis and was previously on treatment, but was told by her rheumatologist that she went into remission, home in 2023 was diagnosed with rectal cancer and underwent chemotherapy and radiation, after which her rheumatoid arthritis symptoms returned.  She states that she was doing well for several years with no arthritis symptoms, but this past 6 months she has had several flares requiring steroid treatment.  She has made an appointment with a rheumatologist Dr. Dimple Casey in May, has been waiting for that appointment for several months but unfortunately the meantime every time she gets off of steroids she eventually has severe joint pain again.  She now has a polyarthritis, which is typical for her rheumatoid arthritis flares.  Denies any fevers, she has pain especially in her bilateral wrists, hands,, knees, ankles.  The joints are swollen, but not red or hot.  ED Course: She was evaluated in the emergency department, vital signs and labs are relatively unremarkable, she was given LR bolus of 1 L, as well as pain medication and IV Solu-Medrol 125 mg.  She ambulates with a cane at baseline, unfortunately now she is so tender all over that she cannot even walk.  Review of Systems: Please see HPI for pertinent positives and negatives. A complete 10 system review of systems are otherwise negative.  Past Medical History:  Diagnosis Date   Anemia    Bipolar 1 disorder (HCC)    Cancer (HCC)    squamous cell rectal cancer   ETOH abuse    H/O self-harm    H/O suicide attempt    x 5 - last 05/2016 - overdose Ibuprofen   Marijuana abuse    Rheumatoid arthritis (HCC)    Past Surgical History:  Procedure Laterality Date   HEMORRHOID SURGERY N/A 02/08/2022    Procedure: EXTERNAL AND INTERNAL HEMORRHOIDECTOMY;  Surgeon: Violeta Gelinas, MD;  Location: [facility] OR;  Service: General;  Laterality: N/A;   INCISION AND DRAINAGE ABSCESS Right 04/22/2022   Procedure: INCISION AND DRAINAGE axilla;  Surgeon: Sheliah Hatch, De Blanch, MD;  Location: [facility] ORS;  Service: General;  Laterality: Right;   RECTAL EXAM UNDER ANESTHESIA N/A 07/21/2022   Procedure: RECTAL EXAM UNDER ANESTHESIA;  Surgeon: Andria Meuse, MD;  Location: [facility] OR;  Service: General;  Laterality: N/A;   TRANSANAL HEMORRHOIDAL DEARTERIALIZATION N/A 07/21/2022   Procedure: ANAL CANAL BIOPSY;  Surgeon: Andria Meuse, MD;  Location: [facility] OR;  Service: General;  Laterality: N/A;    Social History:  reports that she quit smoking about 3 years ago. Her smoking use included cigarettes. She has a 1.75 pack-year smoking history. She has never used smokeless tobacco. She reports that she does not currently use alcohol. She reports that she does not currently use drugs after having used the following drugs: Marijuana.   Allergies  Allergen Reactions   Coconut Flavor Itching and Rash    Itchy throat    Family History  Problem Relation Age of Onset   Hypertension Other    Lupus Mother      Prior to Admission medications   Medication Sig Start Date End Date Taking? Authorizing Provider  gabapentin (NEURONTIN) 300 MG capsule Take 1 capsule (300 mg total) by mouth 3 (three) times daily. 10/01/22  Yes Pickenpack-Cousar, Arty Baumgartner, NP  mirtazapine (  REMERON) 15 MG tablet Take 1 tablet (15 mg total) by mouth at bedtime. 10/01/22  Yes Pickenpack-Cousar, Arty Baumgartner, NP  naproxen (NAPROSYN) 250 MG tablet Take 250 mg by mouth 2 (two) times daily as needed for moderate pain.   Yes [provider]  traMADol-acetaminophen (ULTRACET) 37.5-325 MG tablet Take 1 tablet by mouth every 6 hours as needed. Patient taking differently: Take 1 tablet by mouth every 6 (six) hours as needed for moderate pain. 10/01/22   Yes Pickenpack-Cousar, Arty Baumgartner, NP  meloxicam (MOBIC) 15 MG tablet Take 1 tablet (15 mg total) by mouth daily. Patient not taking: Reported on 11/29/2022 10/27/22   Darrick Grinder, PA-C  methylPREDNISolone (MEDROL DOSEPAK) 4 MG TBPK tablet Take as directed per package instructions Patient not taking: Reported on 11/29/2022 10/27/22   Pamala Duffel    Physical Exam: BP (!) 143/95   Pulse (!) 103   Temp 98.1 F (36.7 C) (Oral)   Resp (!) 22   Ht 6' (1.829 m)   SpO2 98%   BMI 34.38 kg/m   General: Surprisingly pleasant young woman, alert and oriented, she does not look chronically ill. Eyes: EOMI, clear conjuctivae, white sclerea Neck: supple, no masses, trachea mildline  Cardiovascular: RRR, no murmurs or rubs, no peripheral edema  Respiratory: clear to auscultation bilaterally, no wheezes, no crackles  Abdomen: soft, nontender, nondistended, normal bowel tones heard  Skin: dry, no rashes  Musculoskeletal: Her hands feet, knees and ankles are exquisitely tender, no significant warmth or erythema on examination.  Range of motion is severely limited due to swelling and pain. Psychiatric: appropriate affect, normal speech  Neurologic: extraocular muscles intact, clear speech, moving all extremities with intact sensorium          Labs on Admission:  Basic Metabolic Panel: Recent Labs  Lab 11/29/22 0906  NA 137  K 5.3*  CL 103  CO2 21*  GLUCOSE 101*  BUN 11  CREATININE 0.68  CALCIUM 9.1  MG 2.1   Liver Function Tests: Recent Labs  Lab 11/29/22 0906  AST 19  ALT 16  ALKPHOS 89  BILITOT 2.0*  PROT 8.7*  ALBUMIN 2.9*   No results for input(s): "LIPASE", "AMYLASE" in the last 168 hours. No results for input(s): "AMMONIA" in the last 168 hours. CBC: Recent Labs  Lab 11/29/22 0906  WBC 7.9  NEUTROABS 6.0  HGB 10.0*  HCT 32.6*  MCV 87.4  PLT 527*   Cardiac Enzymes: No results for input(s): "CKTOTAL", "CKMB", "CKMBINDEX", "TROPONINI" in the last 168  hours.  BNP (last 3 results) No results for input(s): "BNP" in the last 8760 hours.  ProBNP (last 3 results) No results for input(s): "PROBNP" in the last 8760 hours.  CBG: No results for input(s): "GLUCAP" in the last 168 hours.  Radiological Exams on Admission: DG Chest Port 1 View  Result Date: 11/29/2022 CLINICAL DATA:  Chronic back pain. Undergoing radiation therapy for squamous cell cancer. EXAM: PORTABLE CHEST 1 VIEW COMPARISON:  Radiographs 04/20/2022 and 08/14/2020. CT 03/31/2022. PET-CT 07/08/2022. FINDINGS: 0924 hours. The heart size and mediastinal contours are stable for AP portable technique. The lungs appear clear. There is no pleural effusion or pneumothorax. No acute osseous findings are evident. Telemetry leads overlie the chest. IMPRESSION: Stable radiographic appearance of the chest. No evidence of active cardiopulmonary process. Electronically Signed   By: Carey Bullocks M.D.   On: 11/29/2022 09:59    Assessment/Plan Principal Problem:   RA (rheumatoid arthritis) (HCC)-she has prior  diagnosis of this, currently not on any maintenance medications.  She has had recurrence and flare of her rheumatoid arthritis since completing chemotherapy several months ago.  She has an outpatient rheumatology appointment scheduled in May.  She has no leukocytosis, or joint warmth, or obvious effusion that would particularly concern me for a septic joint. -Observation admission -Oral and IV pain control as needed -Continue IV Solu-Medrol, ordered for 40 mg IV starting tomorrow -Consider discharge on extended prednisone course, until she can see her rheumatologist  Active Problems:   Iron deficiency anemia secondary to blood loss (chronic)   Depression   Bipolar 1 disorder (HCC)   Obesity (BMI 30-39.9)   Joint swelling   Joint pain   Rheumatoid arthritis flare (HCC)   Hyperkalemia-unclear etiology, or she received a liter of IV fluids in the emergency department, will give 1 dose of  Lokelma 5g now and recheck renal function and potassium in the morning  DVT prophylaxis: Lovenox     Code Status: Full Code  Consults called: None  Admission status: Observation   Time spent: 49 minutes  Mohamed Portlock Sharlette Dense MD [facility] Pager [phone]  If 7PM-7AM, please contact night-coverage [email or web address] Password [facility]  11/29/2022, 2:53 PM

## 2022-11-29 NOTE — ED Provider Notes (Signed)
[place] EMERGENCY DEPARTMENT AT [facility] Provider Note   CSN: [id number] Arrival date & time: 11/29/22  2130     History  Chief Complaint  Patient presents with   Pain    Tammy Garrison is a 32 y.o. female.  HPI Patient presents for diffuse pain.  Medical history includes anal squamous cell carcinoma, bipolar disorder, anemia, depression, polysubstance abuse, rheumatoid arthritis.  Medications include gabapentin, Mobic, Remeron, tramadol.  Patient arrives via EMS.  History is limited by her current anxious and tearful state.  She states that she "hurts all over".  She describes pain in all 4 extremities, particularly in the joints.  She also describes thoracic back pain.  She denies any recent injuries.  Patient typically takes gabapentin, tramadol, naproxen for pain.  She takes Remeron for anxiety.  She is unable to describe timeline of her severe pain.  She states that she will have this type of severe pain every morning.  She did not take any pain medication today.  She did take pain medication last night but continued to have severe pain which caused her to be unable to sleep.  Due to the pain, she required a cane to walk last night.  She has not attempted to walk today.    Home Medications Prior to Admission medications   Medication Sig Start Date End Date Taking? Authorizing Provider  gabapentin (NEURONTIN) 300 MG capsule Take 1 capsule (300 mg total) by mouth 3 (three) times daily. 10/01/22  Yes Pickenpack-Cousar, Arty Baumgartner, NP  mirtazapine (REMERON) 15 MG tablet Take 1 tablet (15 mg total) by mouth at bedtime. 10/01/22  Yes Pickenpack-Cousar, Arty Baumgartner, NP  naproxen (NAPROSYN) 250 MG tablet Take 250 mg by mouth 2 (two) times daily as needed for moderate pain.   Yes [provider]  traMADol-acetaminophen (ULTRACET) 37.5-325 MG tablet Take 1 tablet by mouth every 6 hours as needed. Patient taking differently: Take 1 tablet by mouth every 6 (six)  hours as needed for moderate pain. 10/01/22  Yes Pickenpack-Cousar, Arty Baumgartner, NP  meloxicam (MOBIC) 15 MG tablet Take 1 tablet (15 mg total) by mouth daily. Patient not taking: Reported on 11/29/2022 10/27/22   Darrick Grinder, PA-C  methylPREDNISolone (MEDROL DOSEPAK) 4 MG TBPK tablet Take as directed per package instructions Patient not taking: Reported on 11/29/2022 10/27/22   Darrick Grinder, PA-C      Allergies    Coconut flavor    Review of Systems   Review of Systems  Musculoskeletal:  Positive for arthralgias and myalgias.  Psychiatric/Behavioral:  Positive for sleep disturbance. The patient is nervous/anxious.   All other systems reviewed and are negative.   Physical Exam Updated Vital Signs BP (!) 130/90   Pulse 99   Temp 97.7 F (36.5 C) (Oral)   Resp 18   Ht 6' (1.829 m)   SpO2 98%   BMI 34.38 kg/m  Physical Exam Vitals and nursing note reviewed.  Constitutional:      General: She is not in acute distress.    Appearance: She is well-developed. She is not ill-appearing, toxic-appearing or diaphoretic.  HENT:     Head: Normocephalic and atraumatic.     Right Ear: External ear normal.     Left Ear: External ear normal.     Nose: Nose normal.     Mouth/Throat:     Mouth: Mucous membranes are moist.  Eyes:     Extraocular Movements: Extraocular movements intact.     Conjunctiva/sclera:  Conjunctivae normal.  Cardiovascular:     Rate and Rhythm: Regular rhythm. Tachycardia present.  Pulmonary:     Effort: Pulmonary effort is normal. No respiratory distress.  Chest:     Chest wall: No tenderness.  Abdominal:     General: There is no distension.     Palpations: Abdomen is soft.     Tenderness: There is no abdominal tenderness.  Musculoskeletal:        General: Swelling and tenderness present. No deformity.     Cervical back: Normal range of motion and neck supple.     Right lower leg: No edema.     Left lower leg: No edema.  Skin:    General: Skin is warm  and dry.     Capillary Refill: Capillary refill takes less than 2 seconds.     Coloration: Skin is not jaundiced or pale.  Neurological:     General: No focal deficit present.     Mental Status: She is alert and oriented to person, place, and time.  Psychiatric:        Mood and Affect: Mood is anxious. Affect is tearful.        Speech: Speech normal.        Behavior: Behavior is agitated. Behavior is cooperative.     ED Results / Procedures / Treatments   Labs (all labs ordered are listed, but only abnormal results are displayed) Labs Reviewed  COMPREHENSIVE METABOLIC PANEL - Abnormal; Notable for the following components:      Result Value   Potassium 5.3 (*)    CO2 21 (*)    Glucose, Bld 101 (*)    Total Protein 8.7 (*)    Albumin 2.9 (*)    Total Bilirubin 2.0 (*)    All other components within normal limits  RAPID URINE DRUG SCREEN, HOSP PERFORMED - Abnormal; Notable for the following components:   Tetrahydrocannabinol POSITIVE (*)    All other components within normal limits  CBC WITH DIFFERENTIAL/PLATELET - Abnormal; Notable for the following components:   RBC 3.73 (*)    Hemoglobin 10.0 (*)    HCT 32.6 (*)    RDW 17.4 (*)    Platelets 527 (*)    All other components within normal limits  URINALYSIS, ROUTINE W REFLEX MICROSCOPIC - Abnormal; Notable for the following components:   APPearance HAZY (*)    Leukocytes,Ua TRACE (*)    All other components within normal limits  C-REACTIVE PROTEIN - Abnormal; Notable for the following components:   CRP 21.0 (*)    All other components within normal limits  ETHANOL  MAGNESIUM  SEDIMENTATION RATE  I-STAT BETA HCG BLOOD, ED ([facility], [facility], [facility] ONLY)    EKG EKG Interpretation  Date/Time:  Sunday November 29 2022 09:29:55 EDT Ventricular Rate:  102 PR Interval:  146 QRS Duration: 89 QT Interval:  357 QTC Calculation: 465 R Axis:   27 Text Interpretation: Sinus tachycardia Confirmed by Gloris Manchester [phone]) on 11/29/2022 10:42:45  AM  Radiology DG Chest Port 1 View  Result Date: 11/29/2022 CLINICAL DATA:  Chronic back pain. Undergoing radiation therapy for squamous cell cancer. EXAM: PORTABLE CHEST 1 VIEW COMPARISON:  Radiographs 04/20/2022 and 08/14/2020. CT 03/31/2022. PET-CT 07/08/2022. FINDINGS: 0924 hours. The heart size and mediastinal contours are stable for AP portable technique. The lungs appear clear. There is no pleural effusion or pneumothorax. No acute osseous findings are evident. Telemetry leads overlie the chest. IMPRESSION: Stable radiographic appearance of the chest. No evidence of  active cardiopulmonary process. Electronically Signed   By: Carey Bullocks M.D.   On: 11/29/2022 09:59    Procedures Procedures    Medications Ordered in ED Medications  ketorolac (TORADOL) 15 MG/ML injection 15 mg (has no administration in time range)  lactated ringers bolus 1,000 mL (0 mLs Intravenous Stopped 11/29/22 1121)  methylPREDNISolone sodium succinate (SOLU-MEDROL) 125 mg/2 mL injection 125 mg (125 mg Intravenous Given 11/29/22 0926)  oxyCODONE-acetaminophen (PERCOCET/ROXICET) 5-325 MG per tablet 2 tablet (2 tablets Oral Given 11/29/22 0926)  methocarbamol (ROBAXIN) tablet 1,000 mg (1,000 mg Oral Given 11/29/22 1121)  HYDROmorphone (DILAUDID) injection 0.5 mg (0.5 mg Intravenous Given 11/29/22 1212)    ED Course/ Medical Decision Making/ A&P                             Medical Decision Making Amount and/or Complexity of Data Reviewed Labs: ordered. Radiology: ordered.  Risk Prescription drug management.   This patient presents to the ED for concern of diffuse pain, this involves an extensive number of treatment options, and is a complaint that carries with it a high risk of complications and morbidity.  The differential diagnosis includes rheumatoid arthritis, other early inflammatory disorder, medication withdrawal, medication side effect, viral arthritis   Co morbidities that complicate the patient  evaluation  anal squamous cell carcinoma, bipolar disorder, anemia, depression, polysubstance abuse, rheumatoid arthritis   Additional history obtained:  Additional history obtained from N/A External records from outside source obtained and reviewed including EMR   Lab Tests:  I Ordered, and personally interpreted labs.  The pertinent results include: Baseline anemia, no leukocytosis, elevated CRP   Imaging Studies ordered:  I ordered imaging studies including chest x-ray I independently visualized and interpreted imaging which showed no acute findings I agree with the radiologist interpretation   Cardiac Monitoring: / EKG:  The patient was maintained on a cardiac monitor.  I personally viewed and interpreted the cardiac monitored which showed an underlying rhythm of: Sinus rhythm   Problem List / ED Course / Critical interventions / Medication management  Patient presents for diffuse pain.  Pain seems to be more prominent in her joints.  She is anxious and tearful on arrival in the ED.  On exam, she does have some mild swelling with severe tenderness to multiple joints.  She states that she has had similar symptoms in the past.  Per chart review, she was admitted in February for similar symptoms.  She describes a history of rheumatoid arthritis that started after cancer treatment for her anal squamous cell carcinoma.  She has been told that she is in remission from this cancer.  Since her hospitalization, she was seen in the ED 1 month ago.  She was given a steroid taper at that time.  Symptoms seem to have worsened since she came off of steroids.  She has been lost to follow-up with her rheumatologist.  She has an appointment next month to reestablish care.  Patient was given dose of Solu-Medrol today.  She was given multimodal pain control.  Workup results are unremarkable.  Patient had minimal relief of symptoms following multimodal pain medications.  Given her persistent pain and  inability to move, patient was admitted to medicine for further management. I ordered medication including Percocet, Robaxin, Dilaudid, Toradol for analgesia; Solu-Medrol for anti-inflammation; IV fluids for hydration Reevaluation of the patient after these medicines showed that the patient improved I have reviewed the patients home medicines  and have made adjustments as needed   Social Determinants of Health:  Has access to outpatient care         Final Clinical Impression(s) / ED Diagnoses Final diagnoses:  Polyarthralgia    Rx / DC Orders ED Discharge Orders     None         Gloris Manchester, MD 11/29/22 1346

## 2022-11-30 DIAGNOSIS — F419 Anxiety disorder, unspecified: Secondary | ICD-10-CM | POA: Diagnosis present

## 2022-11-30 DIAGNOSIS — Z87891 Personal history of nicotine dependence: Secondary | ICD-10-CM | POA: Diagnosis not present

## 2022-11-30 DIAGNOSIS — E875 Hyperkalemia: Secondary | ICD-10-CM | POA: Diagnosis present

## 2022-11-30 DIAGNOSIS — M069 Rheumatoid arthritis, unspecified: Secondary | ICD-10-CM | POA: Diagnosis present

## 2022-11-30 DIAGNOSIS — G8929 Other chronic pain: Secondary | ICD-10-CM

## 2022-11-30 DIAGNOSIS — D649 Anemia, unspecified: Secondary | ICD-10-CM | POA: Diagnosis not present

## 2022-11-30 DIAGNOSIS — Z9221 Personal history of antineoplastic chemotherapy: Secondary | ICD-10-CM | POA: Diagnosis not present

## 2022-11-30 DIAGNOSIS — Z79899 Other long term (current) drug therapy: Secondary | ICD-10-CM | POA: Diagnosis not present

## 2022-11-30 DIAGNOSIS — Z791 Long term (current) use of non-steroidal anti-inflammatories (NSAID): Secondary | ICD-10-CM | POA: Diagnosis not present

## 2022-11-30 DIAGNOSIS — Z923 Personal history of irradiation: Secondary | ICD-10-CM | POA: Diagnosis not present

## 2022-11-30 DIAGNOSIS — Z832 Family history of diseases of the blood and blood-forming organs and certain disorders involving the immune mechanism: Secondary | ICD-10-CM | POA: Diagnosis not present

## 2022-11-30 DIAGNOSIS — D500 Iron deficiency anemia secondary to blood loss (chronic): Secondary | ICD-10-CM | POA: Diagnosis present

## 2022-11-30 DIAGNOSIS — Z8249 Family history of ischemic heart disease and other diseases of the circulatory system: Secondary | ICD-10-CM | POA: Diagnosis not present

## 2022-11-30 DIAGNOSIS — F319 Bipolar disorder, unspecified: Secondary | ICD-10-CM | POA: Diagnosis present

## 2022-11-30 DIAGNOSIS — Z85048 Personal history of other malignant neoplasm of rectum, rectosigmoid junction, and anus: Secondary | ICD-10-CM | POA: Diagnosis not present

## 2022-11-30 DIAGNOSIS — M2550 Pain in unspecified joint: Secondary | ICD-10-CM | POA: Diagnosis present

## 2022-11-30 DIAGNOSIS — Z6834 Body mass index (BMI) 34.0-34.9, adult: Secondary | ICD-10-CM | POA: Diagnosis not present

## 2022-11-30 DIAGNOSIS — E669 Obesity, unspecified: Secondary | ICD-10-CM | POA: Diagnosis present

## 2022-11-30 LAB — BASIC METABOLIC PANEL
Anion gap: 9 (ref 5–15)
BUN: 11 mg/dL (ref 6–20)
CO2: 24 mmol/L (ref 22–32)
Calcium: 9 mg/dL (ref 8.9–10.3)
Chloride: 103 mmol/L (ref 98–111)
Creatinine, Ser: 0.64 mg/dL (ref 0.44–1.00)
GFR, Estimated: >60 mL/min (ref >60)
Glucose, Bld: 120 mg/dL — ABNORMAL HIGH (ref 70–99)
Potassium: 3.7 mmol/L (ref 3.5–5.1)
Sodium: 136 mmol/L (ref 135–145)

## 2022-11-30 LAB — CBC
HCT: 32.2 % — ABNORMAL LOW (ref 36.0–46.0)
Hemoglobin: 9.7 g/dL — ABNORMAL LOW (ref 12.0–15.0)
MCH: 26.5 pg (ref 26.0–34.0)
MCHC: 30.1 g/dL (ref 30.0–36.0)
MCV: 88 fL (ref 80.0–100.0)
Platelets: 615 10*3/uL — ABNORMAL HIGH (ref 150–400)
RBC: 3.66 MIL/uL — ABNORMAL LOW (ref 3.87–5.11)
RDW: 16.7 % — ABNORMAL HIGH (ref 11.5–15.5)
WBC: 11.7 10*3/uL — ABNORMAL HIGH (ref 4.0–10.5)
nRBC: 0 % (ref 0.0–0.2)

## 2022-11-30 MED ORDER — HYDROMORPHONE HCL 1 MG/ML IJ SOLN
1.0000 mg | INTRAMUSCULAR | Status: DC | PRN
  Administered 2022-11-30 – 2022-12-01 (×2): 1 mg via INTRAVENOUS
  Filled 2022-11-30 (×2): qty 1

## 2022-11-30 MED ORDER — ACETAMINOPHEN 325 MG PO TABS
650.0000 mg | ORAL_TABLET | Freq: Four times a day (QID) | ORAL | Status: DC
  Administered 2022-11-30 – 2022-12-01 (×4): 650 mg via ORAL
  Filled 2022-11-30 (×4): qty 2

## 2022-11-30 MED ORDER — OXYCODONE HCL 5 MG PO TABS
10.0000 mg | ORAL_TABLET | ORAL | Status: DC | PRN
  Administered 2022-11-30: 15 mg via ORAL
  Administered 2022-11-30: 10 mg via ORAL
  Administered 2022-12-01 (×2): 15 mg via ORAL
  Filled 2022-11-30 (×2): qty 3
  Filled 2022-11-30: qty 2
  Filled 2022-11-30: qty 3

## 2022-11-30 NOTE — TOC Progression Note (Signed)
Transition of Care [facility]) - Progression Note    Patient Details  Name: Tammy Garrison MRN: [id number] Date of Birth: Dec 26, 1990  Transition of Care [facility]) CM/SW Contact  Beckie Busing, RN Phone Number:[phone]  11/30/2022, 3:49 PM  Clinical Narrative:    Transition of Care [facility]) Screening Note   Patient Details  Name: Tammy Garrison Date of Birth: 04/11/1991   Transition of Care [facility]) CM/SW Contact:    Beckie Busing, RN Phone Number: 11/30/2022, 3:50 PM    Transition of Care Department [facility]) has reviewed patient and no TOC needs have been identified at this time. We will continue to monitor patient advancement through interdisciplinary progression rounds. If new patient transition needs arise, please place a TOC consult.          Expected Discharge Plan and Services                                               Social Determinants of Health (SDOH) Interventions SDOH Screenings   Food Insecurity: Food Insecurity Present (11/29/2022)  Housing: Medium Risk (11/29/2022)  Transportation Needs: No Transportation Needs (11/29/2022)  Utilities: At Risk (11/29/2022)  Financial Resource Strain: High Risk (02/26/2022)  Tobacco Use: Medium Risk (11/29/2022)    Readmission Risk Interventions     No data to display

## 2022-11-30 NOTE — Hospital Course (Addendum)
32 year old woman PMH rheumatoid arthritis, anal squamous cell carcinoma, presented with severe whole body joint pain suggestive of rheumatoid arthritis flare.  Unable to ambulate so was admitted for pain control.

## 2022-11-30 NOTE — Progress Notes (Signed)
  Progress Note   Patient: Tammy Garrison ZOX:[id number] DOB: Nov 17, 1990 DOA: 11/29/2022     0 DOS: the patient was seen and examined on 11/30/2022   Brief hospital course: 32 year old woman PMH rheumatoid arthritis, anal squamous cell carcinoma, presented with severe whole body joint pain suggestive of rheumatoid arthritis flare.  Unable to ambulate so was admitted for pain control.  Assessment and Plan: RA (HCC) flare with Severe polyarticular pain Pain still uncontrolled.  Scheduled Tylenol, increase oxycodone and use Dilaudid for breakthrough. ESR high.  Continue steroids.  Has follow-up with her rheumatologist in March. No signs or symptoms to suggest complicating features.  Chronic pain Primarily from her to arthritis rather than cancer.  [facility] Continue gabapentin, mirtazapine.  I have notified Ms. Tammy Garrison who follows her as an outpatient  Normocytic anemia Stable.  Suspect anemia of chronic disease.  Follow-up as an outpatient.  Spurious hyperkalemia   Hemolysis noted.  No further evaluation.  PMH anal squamous cell carcinoma Last seen by Dr. Mosetta Putt January 2024 with plan to follow-up in May  Body mass index is 34.89 kg/m.      Subjective:  Some improvement but still has severe polyarticular pain, uncontrolled  Physical Exam: Vitals:   11/29/22 2146 11/30/22 0303 11/30/22 0641 11/30/22 1346  BP: (!) 143/99 (!) 134/94 (!) 140/92 122/80  Pulse: (!) 104 87 82 91  Resp: 18 14 18 20   Temp: 97.9 F (36.6 C) 98 F (36.7 C) 98 F (36.7 C) 98.6 F (37 C)  TempSrc: Oral Oral Oral Oral  SpO2: 99% 98% 99% 100%  Weight:      Height:       Physical Exam Vitals reviewed.  Constitutional:      General: She is not in acute distress.    Appearance: She is not ill-appearing or toxic-appearing.  Cardiovascular:     Rate and Rhythm: Normal rate and regular rhythm.     Heart sounds: No murmur heard. Pulmonary:      Effort: Pulmonary effort is normal. No respiratory distress.     Breath sounds: No wheezing, rhonchi or rales.  Musculoskeletal:     Right lower leg: No edema.     Left lower leg: No edema.  Neurological:     Mental Status: She is alert.  Psychiatric:        Mood and Affect: Mood normal.        Behavior: Behavior normal.     Data Reviewed: BMP noted Hgb stable 9.7 Minimal leukocytosis on steroids ESR 130 UDS positive for THC  Family Communication: none   Disposition: Status is: Inpatient   Planned Discharge Destination: Home    Time spent: 35 minutes  Author: Brendia Sacks, MD 11/30/2022 3:24 PM  For on call review [email or web address].

## 2022-12-01 ENCOUNTER — Other Ambulatory Visit (HOSPITAL_COMMUNITY): Payer: Self-pay

## 2022-12-01 ENCOUNTER — Telehealth: Payer: Medicaid Other | Admitting: Nurse Practitioner

## 2022-12-01 DIAGNOSIS — M2550 Pain in unspecified joint: Secondary | ICD-10-CM | POA: Diagnosis not present

## 2022-12-01 DIAGNOSIS — M069 Rheumatoid arthritis, unspecified: Secondary | ICD-10-CM | POA: Diagnosis not present

## 2022-12-01 MED ORDER — POLYETHYLENE GLYCOL 3350 17 G PO PACK: 17.0000 g | PACK | Freq: Every day | ORAL | Status: DC | PRN

## 2022-12-01 MED ORDER — OXYCODONE HCL 5 MG PO TABS
10.0000 mg | ORAL_TABLET | Freq: Three times a day (TID) | ORAL | 0 refills | Status: AC | PRN
  Filled 2022-12-01: qty 22, 3d supply, fill #0

## 2022-12-01 MED ORDER — PANTOPRAZOLE SODIUM 40 MG PO TBEC
40.0000 mg | DELAYED_RELEASE_TABLET | Freq: Every day | ORAL | 1 refills | Status: DC
  Filled 2022-12-01: qty 30, 30d supply, fill #0
  Filled 2023-01-10: qty 30, 30d supply, fill #1

## 2022-12-01 MED ORDER — ACETAMINOPHEN 325 MG PO TABS: 650.0000 mg | ORAL_TABLET | Freq: Four times a day (QID) | ORAL | Status: AC | PRN

## 2022-12-01 MED ORDER — PREDNISONE 10 MG PO TABS
ORAL_TABLET | ORAL | 0 refills | Status: DC
  Filled 2022-12-01: qty 91, 28d supply, fill #0

## 2022-12-01 NOTE — Discharge Summary (Signed)
Physician Discharge Summary   Patient: Tammy Garrison MRN: [id number] DOB: 08/12/1990  Admit date:     11/29/2022  Discharge date: 12/01/22  Discharge Physician: Brendia Sacks   PCP: [facility]   Recommendations at discharge:  Admitted for rheumatoid arthritis.  On slow steroid taper.  Discharge Diagnoses: Principal Problem:   Rheumatoid arthritis flare (HCC) Active Problems:   Iron deficiency anemia secondary to blood loss (chronic)   RA (rheumatoid arthritis) (HCC)   Depression   Bipolar 1 disorder (HCC)   Obesity (BMI 30-39.9)   Joint swelling   Joint pain  Resolved Problems:   * No resolved hospital problems. *  Hospital Course: 32 year old woman PMH rheumatoid arthritis, anal squamous cell carcinoma, presented with severe whole body joint pain suggestive of rheumatoid arthritis flare.  Unable to ambulate so was admitted for pain control.  Treated with analgesia and steroids with rapid clinical improvement.  Discharged home in good condition.  RA (HCC) flare with Severe polyarticular pain Pain now controlled.  Continue Tylenol, oxycodone for short course on discharge. ESR high.  Continue steroids.  Has follow-up with her rheumatologist in March.  Slow taper. No signs or symptoms to suggest complicating features.   Chronic pain Primarily from her to arthritis rather than cancer.  Followed by [facility] palliative medicine Continue gabapentin, mirtazapine.  I have notified Tammy Garrison who follows her as an outpatient   Normocytic anemia Stable.  Suspect anemia of chronic disease.  Follow-up as an outpatient.   Spurious hyperkalemia   Hemolysis noted.  No further evaluation.   PMH anal squamous cell carcinoma Last seen by Dr. Mosetta Putt January 2024 with plan to follow-up in May   Body mass index is 34.89 kg/m.     Pain control - [employer] Controlled Substance Reporting System database was reviewed.   Consultants:   None  Procedures performed:  None   Disposition: Home Diet recommendation:  Regular diet DISCHARGE MEDICATION: Allergies as of 12/01/2022       Reactions   Coconut Flavor Itching, Rash   Itchy throat        Medication List     STOP taking these medications    meloxicam 15 MG tablet Commonly known as: MOBIC   methylPREDNISolone 4 MG Tbpk tablet Commonly known as: MEDROL DOSEPAK   naproxen 250 MG tablet Commonly known as: NAPROSYN   traMADol-acetaminophen 37.5-325 MG tablet Commonly known as: Ultracet       TAKE these medications    acetaminophen 325 MG tablet Commonly known as: TYLENOL Take 2 tablets (650 mg total) by mouth every 6 (six) hours as needed for mild pain.   gabapentin 300 MG capsule Commonly known as: NEURONTIN Take 1 capsule (300 mg total) by mouth 3 (three) times daily.   mirtazapine 15 MG tablet Commonly known as: REMERON Take 1 tablet (15 mg total) by mouth at bedtime.   oxyCODONE 5 MG immediate release tablet Commonly known as: Oxy IR/ROXICODONE Take 2-3 tablets (10-15 mg total) by mouth every 8 (eight) hours as needed for up to 5 days for moderate pain or severe pain (10mg  for moderate pain, 15 mg for severe pain).   pantoprazole 40 MG tablet Commonly known as: Protonix Take 1 tablet (40 mg total) by mouth daily.   polyethylene glycol 17 g packet Commonly known as: MIRALAX / GLYCOLAX Take 17 g by mouth daily as needed for mild constipation.   predniSONE 10 MG tablet Commonly known as: DELTASONE Take 6 tablets (60 mg  total) by mouth daily for 7 days, THEN 4 tablets (40 mg total) daily for 7 days, THEN 2 tablets (20 mg total) daily for 7 days, THEN 1 tablet (10 mg total) daily for 7 days. Start taking on: December 01, 2022        Follow-up Information     Fuller Plan, MD Follow up on 12/25/2022.   Specialty: Rheumatology Why: 8 AM Contact information: [address] [place]  [address] [phone]         [facility]. Schedule an appointment as soon as possible for a visit in 1 week(s).   Specialty: Internal Medicine Contact information: [address] [place] [address] [phone]                Feels better Pain better controlled Walking ok Ready to go home  Discharge Exam: Ceasar Mons Weights   11/29/22 1700  Weight: 116.7 kg   Physical Exam Vitals reviewed.  Constitutional:      General: She is not in acute distress.    Appearance: She is not ill-appearing or toxic-appearing.  Cardiovascular:     Rate and Rhythm: Normal rate and regular rhythm.     Heart sounds: No murmur heard. Pulmonary:     Effort: Pulmonary effort is normal. No respiratory distress.     Breath sounds: No wheezing, rhonchi or rales.  Neurological:     Mental Status: She is alert.  Psychiatric:        Mood and Affect: Mood normal.        Behavior: Behavior normal.      Condition at discharge: good  The results of significant diagnostics from this hospitalization (including imaging, microbiology, ancillary and laboratory) are listed below for reference.   Imaging Studies: DG Chest Port 1 View  Result Date: 11/29/2022 CLINICAL DATA:  Chronic back pain. Undergoing radiation therapy for squamous cell cancer. EXAM: PORTABLE CHEST 1 VIEW COMPARISON:  Radiographs 04/20/2022 and 08/14/2020. CT 03/31/2022. PET-CT 07/08/2022. FINDINGS: 0924 hours. The heart size and mediastinal contours are stable for AP portable technique. The lungs appear clear. There is no pleural effusion or pneumothorax. No acute osseous findings are evident. Telemetry leads overlie the chest. IMPRESSION: Stable radiographic appearance of the chest. No evidence of active cardiopulmonary process. Electronically Signed   By: Carey Bullocks M.D.   On: 11/29/2022 09:59    Microbiology: Results for orders placed or performed during the hospital encounter of 09/17/22  Body fluid culture w  Gram Stain     Status: None   Collection Time: 09/17/22 11:37 PM   Specimen: KNEE; Body Fluid  Result Value Ref Range Status   Specimen Description   Final    KNEE Performed at [facility], [address], [place], [place] [address]    Special Requests   Final    NONE Performed at [facility], [address] [place], [place] [address]    Gram Stain   Final    FEW WBC PRESENT,BOTH PMN AND MONONUCLEAR NO ORGANISMS SEEN    Culture   Final    NO GROWTH 3 DAYS Performed at [facility], [address] [place], [place] [address]    Report Status 09/21/2022 FINAL  Final    Labs: CBC: Recent Labs  Lab 11/29/22 0906 11/29/22 1529 11/30/22 0656  WBC 7.9 11.4* 11.7*  NEUTROABS 6.0  --   --   HGB 10.0* 10.1* 9.7*  HCT 32.6* 32.6* 32.2*  MCV 87.4 86.9 88.0  PLT 527* 612* 615*   Basic Metabolic Panel: Recent Labs  Lab 11/29/22 0906 11/29/22 1529 11/30/22 0656  NA 137  --  136  K 5.3*  --  3.7  CL 103  --  103  CO2 21*  --  24  GLUCOSE 101*  --  120*  BUN 11  --  11  CREATININE 0.68 0.74 0.64  CALCIUM 9.1  --  9.0  MG 2.1  --   --    Liver Function Tests: Recent Labs  Lab 11/29/22 0906  AST 19  ALT 16  ALKPHOS 89  BILITOT 2.0*  PROT 8.7*  ALBUMIN 2.9*   CBG: No results for input(s): "GLUCAP" in the last 168 hours.  Discharge time spent: less than 30 minutes.  Signed: Brendia Sacks, MD [facility] 12/01/2022

## 2022-12-01 NOTE — Plan of Care (Addendum)
A/ox4. Independent in room. Prn pain meds given as needed. No acute changes over night.   Problem: Clinical Measurements: Goal: Ability to maintain clinical measurements within normal limits will improve Outcome: Progressing Goal: Will remain free from infection Outcome: Progressing Goal: Diagnostic test results will improve Outcome: Progressing Goal: Respiratory complications will improve Outcome: Progressing Goal: Cardiovascular complication will be avoided Outcome: Progressing   Problem: Activity: Goal: Risk for activity intolerance will decrease Outcome: Progressing   Problem: Nutrition: Goal: Adequate nutrition will be maintained Outcome: Progressing   Problem: Coping: Goal: Level of anxiety will decrease Outcome: Progressing   Problem: Pain Managment: Goal: General experience of comfort will improve Outcome: Progressing   Problem: Safety: Goal: Ability to remain free from injury will improve Outcome: Progressing   Problem: Skin Integrity: Goal: Risk for impaired skin integrity will decrease Outcome: Progressing

## 2022-12-01 NOTE — Progress Notes (Signed)
Reviewed d/c summary with pt, no concerns noted, states pain is more manageable now. Pt up and walking without difficulty, piv d/c, pressure dressing placed.

## 2022-12-10 ENCOUNTER — Other Ambulatory Visit: Payer: Self-pay | Admitting: Nurse Practitioner

## 2022-12-10 DIAGNOSIS — Z515 Encounter for palliative care: Secondary | ICD-10-CM

## 2022-12-10 DIAGNOSIS — G893 Neoplasm related pain (acute) (chronic): Secondary | ICD-10-CM

## 2022-12-10 DIAGNOSIS — R634 Abnormal weight loss: Secondary | ICD-10-CM

## 2022-12-10 DIAGNOSIS — R630 Anorexia: Secondary | ICD-10-CM

## 2022-12-10 DIAGNOSIS — C210 Malignant neoplasm of anus, unspecified: Secondary | ICD-10-CM

## 2022-12-10 DIAGNOSIS — F32A Depression, unspecified: Secondary | ICD-10-CM

## 2022-12-11 ENCOUNTER — Other Ambulatory Visit (HOSPITAL_COMMUNITY): Payer: Self-pay

## 2022-12-11 ENCOUNTER — Encounter: Payer: Self-pay | Admitting: Hematology

## 2022-12-11 MED ORDER — MIRTAZAPINE 15 MG PO TABS
15.0000 mg | ORAL_TABLET | Freq: Every day | ORAL | 2 refills | Status: DC
  Filled 2022-12-11: qty 30, 30d supply, fill #0
  Filled 2023-01-10 – 2023-01-11 (×2): qty 30, 30d supply, fill #1
  Filled 2023-02-10: qty 30, 30d supply, fill #2

## 2022-12-15 ENCOUNTER — Encounter: Payer: Self-pay | Admitting: Hematology

## 2022-12-15 ENCOUNTER — Other Ambulatory Visit (HOSPITAL_COMMUNITY): Payer: Self-pay

## 2022-12-24 NOTE — Progress Notes (Signed)
Office Visit Note  Patient: Tammy Garrison             Date of Birth: 08/28/1990           MRN: [id number]             PCP: [facility] Referring: Malachy Mood, MD Visit Date: 12/25/2022   Subjective:  New Patient (Initial Visit) (Patient states she has pain all over but her wrists, knees, feet, elbows and shoulders hurt the most. )   History of Present Illness: Tammy Garrison is a 32 y.o. female here for evaluation and management of seropositive rheumatoid arthritis.  Originally diagnosed with onset of symptoms and 2019 and saw Dr. Kathi Ludwig for this.  She was initially treated with methotrexate which was ineffective subsequently treated with Harriette Ohara with a good improvement.  She discontinued treatment in 2021 due to cost being prohibitive also was in disease remission at the time.  She continued to do well off of any maintenance therapy until last year after diagnosis with anal carcinoma then treated with concurrent chemoradiation therapy and redeveloped significant joint inflammation in multiple areas.  Her treatments and symptoms that put her out of work most of the past year.  The past few months joint inflammation is getting worse has been seen at the hospital multiple times and treated with prolonged steroid tapers twice in February and April.  She see symptom relief when started on the high-dose steroids typically with some starting to return near the end of the taper and then gets a severe flare again within a few days of discontinuing medicine.  Currently she is off any steroids for 4 days and has severe joint pain and swelling in multiple areas. She also has hidradenitis suppurativa with involvement of the axillary, intertriginous, and groin areas.  No current open cysts or drainage.  Does notice some hardened mildly tender area along the left lower breast.  She was never on prolonged antibiotics and does not see dermatology for this condition.  HS symptoms were also  quiescent during the few years off treatment before her cancer last year. No history of frequent respiratory or urinary tract infections.  She has had problems with bleeding and blood clots associated with the anal cancer but not prior to this.  No shortness of breath or persistent cough.  DMARD Hx Harriette Ohara - stopped 2021 due to cost and disease remission MTX - nonresponder  Activities of Daily Living:  Patient reports morning stiffness for 24 hours.   Patient Reports nocturnal pain.  Difficulty dressing/grooming: Reports Difficulty climbing stairs: Reports Difficulty getting out of chair: Reports Difficulty using hands for taps, buttons, cutlery, and/or writing: Reports  Review of Systems  Constitutional:  Positive for fatigue.  HENT:  Negative for mouth sores and mouth dryness.   Eyes:  Negative for dryness.  Respiratory:  Positive for shortness of breath.   Cardiovascular:  Positive for chest pain and palpitations.  Gastrointestinal:  Positive for blood in stool and constipation. Negative for diarrhea.  Endocrine: Positive for increased urination.  Genitourinary:  Positive for involuntary urination.  Musculoskeletal:  Positive for joint pain, gait problem, joint pain, joint swelling, myalgias, muscle weakness, morning stiffness, muscle tenderness and myalgias.  Skin:  Negative for color change, rash, hair loss and sensitivity to sunlight.  Allergic/Immunologic: Positive for susceptible to infections.  Neurological:  Positive for dizziness. Negative for headaches.  Hematological:  Negative for swollen glands.  Psychiatric/Behavioral:  Positive for depressed mood and sleep disturbance.  The patient is nervous/anxious.     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   Bipolar 1 disorder (HCC) 02/23/2022   RA (rheumatoid arthritis) (HCC) 02/07/2022   Syncope 02/06/2022   ABLA (acute blood loss anemia) 02/06/2022   BRBPR (bright red blood per rectum) 02/06/2022   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   Depression 05/20/2016   Marijuana dependence (HCC) 05/19/2016    Past Medical History:  Diagnosis Date   Anemia    Bipolar 1 disorder (HCC)    Cancer (HCC)    squamous cell rectal cancer   ETOH abuse    H/O self-harm    H/O suicide attempt    x 5 - last 05/2016 - overdose Ibuprofen   Marijuana abuse    Rheumatoid arthritis (HCC)     Family History  Problem Relation Age of Onset   Lupus Mother    Hypertension Other    Past Surgical History:  Procedure Laterality Date   HEMORRHOID SURGERY N/A 02/08/2022   Procedure: EXTERNAL AND INTERNAL HEMORRHOIDECTOMY;  Surgeon: Violeta Gelinas, MD;  Location: [facility] OR;  Service: General;  Laterality: N/A;   INCISION AND DRAINAGE ABSCESS Right 04/22/2022   Procedure: INCISION AND DRAINAGE axilla;  Surgeon: Sheliah Hatch, De Blanch, MD;  Location: [facility] ORS;  Service: General;  Laterality: Right;   RECTAL EXAM UNDER ANESTHESIA N/A 07/21/2022   Procedure: RECTAL EXAM UNDER ANESTHESIA;  Surgeon: Andria Meuse, MD;  Location: [facility] OR;  Service: General;  Laterality: N/A;   TRANSANAL HEMORRHOIDAL DEARTERIALIZATION N/A 07/21/2022   Procedure: ANAL CANAL BIOPSY;  Surgeon: Andria Meuse, MD;  Location: [facility] OR;  Service: General;  Laterality: N/A;   Social History   Social History Narrative   Not on file    There is no immunization history on file for this patient.   Objective: Vital Signs: BP (!) 146/98 (BP  Location: Right Arm, Patient Position: Sitting, Cuff Size: Normal)   Pulse (!) 109   Resp 16   Ht 5\' 9"  (1.753 m)   Wt 271 lb (122.9 kg)   BMI 40.02 kg/m    Physical Exam Constitutional:      Appearance: She is obese.  HENT:     Mouth/Throat:     Mouth: Mucous membranes are moist.     Pharynx: Oropharynx is clear.  Eyes:     Conjunctiva/sclera: Conjunctivae normal.  Cardiovascular:     Rate and Rhythm: Normal rate and regular rhythm.  Pulmonary:     Effort: Pulmonary effort is normal.     Breath sounds: Normal breath sounds.  Musculoskeletal:     Right lower leg: No edema.     Left lower leg: No edema.  Lymphadenopathy:     Cervical: No cervical adenopathy.  Skin:    General: Skin is warm and dry.     Findings: No rash.  Neurological:     Mental Status: She is alert.  Psychiatric:        Mood and Affect: Mood  normal.      Musculoskeletal Exam:  Shoulder pain with any overhead abduction, tenderness to pressure without palpable swelling Large bilateral elbow effusions, extension range of motion limited about 100 degrees and tender to pressure Bilateral wrist tenderness and swelling more severe on the right side with visible erythema and palpable warmth Bilateral first MCP joint swelling, right hand with palpable synovitis of MCPs, tenderness throughout proximal finger joints of both hands grip range of motion limited at about 70%, no joint subluxation or lateral deviation Large right knee effusion with tenderness to pressure Left ankle and first MTP joint swelling, tenderness throughout left MTPs, right foot normal  CDAI Exam: CDAI Score: 50  Patient Global: 70 mm; Provider Global: 70 mm Swollen: 12 ; Tender: 30  Joint Exam 12/25/2022      Right  Left  Glenohumeral   Tender   Tender  Elbow  Swollen Tender  Swollen Tender  Wrist  Swollen Tender  Swollen Tender  MCP 1  Swollen Tender  Swollen Tender  MCP 2  Swollen Tender   Tender  MCP 3  Swollen Tender   Tender   MCP 4  Swollen Tender   Tender  MCP 5  Swollen Tender   Tender  PIP 2   Tender   Tender  PIP 3   Tender   Tender  PIP 4   Tender   Tender  PIP 5   Tender   Tender  Knee  Swollen Tender     MTP 1     Swollen Tender  MTP 2      Tender  MTP 3      Tender  MTP 4      Tender  MTP 5      Tender     Investigation: No additional findings.  Imaging: DG Chest Port 1 View  Result Date: 11/29/2022 CLINICAL DATA:  Chronic back pain. Undergoing radiation therapy for squamous cell cancer. EXAM: PORTABLE CHEST 1 VIEW COMPARISON:  Radiographs 04/20/2022 and 08/14/2020. CT 03/31/2022. PET-CT 07/08/2022. FINDINGS: 0924 hours. The heart size and mediastinal contours are stable for AP portable technique. The lungs appear clear. There is no pleural effusion or pneumothorax. No acute osseous findings are evident. Telemetry leads overlie the chest. IMPRESSION: Stable radiographic appearance of the chest. No evidence of active cardiopulmonary process. Electronically Signed   By: Carey Bullocks M.D.   On: 11/29/2022 09:59    Recent Labs: Lab Results  Component Value Date   WBC 11.7 (H) 11/30/2022   HGB 9.7 (L) 11/30/2022   PLT 615 (H) 11/30/2022   NA 136 11/30/2022   K 3.7 11/30/2022   CL 103 11/30/2022   CO2 24 11/30/2022   GLUCOSE 120 (H) 11/30/2022   BUN 11 11/30/2022   CREATININE 0.64 11/30/2022   BILITOT 2.0 (H) 11/29/2022   ALKPHOS 89 11/29/2022   AST 19 11/29/2022   ALT 16 11/29/2022   PROT 8.7 (H) 11/29/2022   ALBUMIN 2.9 (L) 11/29/2022   CALCIUM 9.0 11/30/2022   GFRAA >60 04/12/2018    Speciality Comments: No specialty comments available.  Procedures:  No procedures performed Allergies: Coconut flavor   Assessment / Plan:     Visit Diagnoses: Rheumatoid arthritis involving multiple sites with positive rheumatoid factor (HCC) - Plan: predniSONE (DELTASONE) 10 MG tablet  Highly active disease with synovitis of multiple joints on exam today.  Recommend initial treatment plan to  restart treatment with Harriette Ohara which was previously effective have been discontinued due to disease remission  or at least minimal activity.  Seems likely that the return of severe inflammation was in someway associated with her cancer diagnosis and treatment though that has already been completed.  Will resume steroids on a gradual taper additional 30 mg daily down by 10 mg increment every 2 weeks in the meantime.  High risk medication use - Plan: Hepatitis B core antibody, IgM, Hepatitis B surface antigen, Hepatitis C antibody, QuantiFERON-TB Gold Plus, Lipid panel, CBC with Differential/Platelet, COMPLETE METABOLIC PANEL WITH GFR  Checking baseline labs for planned check inhibitor start.  She was previously treated on Xeljanz without major side effect.  Reviewed risk of medication including cytopenias, hepatotoxicity, blood clots, major cardiovascular event.  She does not have any history of diverticulitis or of clots.  Hidradenitis suppurativa  Currently not disease exacerbation but could also be a contributor to elevated systemic inflammation.  Should also benefit with getting reestablished on antirheumatic DMARD.  Orders: Orders Placed This Encounter  Procedures   Hepatitis B core antibody, IgM   Hepatitis B surface antigen   Hepatitis C antibody   QuantiFERON-TB Gold Plus   Lipid panel   CBC with Differential/Platelet   COMPLETE METABOLIC PANEL WITH GFR   Meds ordered this encounter  Medications   predniSONE (DELTASONE) 10 MG tablet    Sig: Take 3 tablets by mouth daily with breakfast for 14 days, THEN take 2 tablets daily with breakfast for 14 days, THEN take 1 tablet daily with breakfast for 28 days.    Dispense:  98 tablet    Refill:  0     Follow-Up Instructions: Return in about 2 months (around 02/24/2023) for New pt RA GC/TOFA restart f/u 2mos.   Fuller Plan, MD  Note - This record has been created using [employer].  Chart creation errors have been sought,  but may not always  have been located. Such creation errors do not reflect on  the standard of medical care.

## 2022-12-25 ENCOUNTER — Ambulatory Visit: Payer: Medicaid Other | Attending: Internal Medicine | Admitting: Internal Medicine

## 2022-12-25 ENCOUNTER — Encounter: Payer: Self-pay | Admitting: Internal Medicine

## 2022-12-25 ENCOUNTER — Telehealth: Payer: Self-pay | Admitting: Pharmacist

## 2022-12-25 ENCOUNTER — Other Ambulatory Visit (HOSPITAL_COMMUNITY): Payer: Self-pay

## 2022-12-25 VITALS — BP 146/98 | HR 109 | Resp 16 | Ht 69.0 in | Wt 271.0 lb

## 2022-12-25 DIAGNOSIS — M0579 Rheumatoid arthritis with rheumatoid factor of multiple sites without organ or systems involvement: Secondary | ICD-10-CM | POA: Diagnosis not present

## 2022-12-25 DIAGNOSIS — L732 Hidradenitis suppurativa: Secondary | ICD-10-CM | POA: Diagnosis not present

## 2022-12-25 DIAGNOSIS — Z79899 Other long term (current) drug therapy: Secondary | ICD-10-CM | POA: Diagnosis not present

## 2022-12-25 LAB — CBC WITH DIFFERENTIAL/PLATELET
HCT: 36.1 % (ref 35.0–45.0)
Lymphs Abs: 852 cells/uL (ref 850–3900)
MCH: 26.6 pg — ABNORMAL LOW (ref 27.0–33.0)
Monocytes Relative: 12 %
Neutrophils Relative %: 72.5 %
RBC: 4.33 10*6/uL (ref 3.80–5.10)
Total Lymphocyte: 12 %

## 2022-12-25 MED ORDER — PREDNISONE 10 MG PO TABS
ORAL_TABLET | ORAL | 0 refills | Status: DC
Start: 2022-12-25 — End: 2023-02-11
  Filled 2022-12-25: qty 70, 28d supply, fill #0
  Filled 2023-01-15: qty 28, 28d supply, fill #1

## 2022-12-25 NOTE — Patient Instructions (Signed)
Tofacitinib Tablets What is this medication? TOFACITINIB (TOE fa SYE it nib) treats autoimmune conditions, such as arthritis and ulcerative colitis. It is prescribed when other medications have not worked or cannot be tolerated. It works by slowing down an overactive immune system. This decreases inflammation. This medicine may be used for other purposes; ask your health care provider or pharmacist if you have questions. COMMON BRAND NAME(S): Harriette Ohara What should I tell my care team before I take this medication? They need to know if you have any of these conditions: Blood clots Cancer Diabetes Heart disease High blood pressure High cholesterol HIV or AIDS Immune system problems Infection, such as tuberculosis (TB), or other bacterial, fungal, or viral infections Kidney disease Liver disease Low blood counts (white cells, red cells, and platelets) Lung or breathing disease, such as asthma or COPD Organ transplant Stomach or intestine problems Tobacco use An unusual or allergic reaction to tofacitinib, other medications, foods, dyes, or preservatives Pregnant or trying to get pregnant Breast-feeding How should I use this medication? Take this medication by mouth with water. Take it as directed on the prescription label at the same time every day. You can take it with or without food. If it upsets your stomach, take it with food. Keep taking it unless your care team tells you to stop. A special MedGuide will be given to you by the pharmacist with each prescription and refill. Be sure to read this information carefully each time. Talk to your care team about the use of this medication in children. While this medication may be prescribed for children as young as 2 years for selected conditions, precautions do apply. Overdosage: If you think you have taken too much of this medicine contact a poison control center or emergency room at once. NOTE: This medicine is only for you. Do not share  this medicine with others. What if I miss a dose? If you miss a dose, take it as soon as you can. If it is almost time for your next dose, take only that dose. Do not take double or extra doses. What may interact with this medication? Do not take this medication with any of the following: Baricitinib Upadacitinib This medication may also interact with the following: Azathioprine Biologic medications, such as abatacept, adalimumab, anakinra, certolizumab, etanercept, golimumab, infliximab, ofatumumab, rituximab, sarilumab, secukinumab, tocilizumab, ustekinumab, vedolizumab Certain antivirals for HIV or hepatitis Certain medications for fungal infections, such as fluconazole, itraconazole, ketoconazole, voriconazole Certain medications for seizures, such as carbamazepine, phenobarbital, phenytoin Cyclosporine Live virus vaccines Medications that lower your chance of fighting infection Rifampin Supplements, such as St. John's wort Tacrolimus This list may not describe all possible interactions. Give your health care provider a list of all the medicines, herbs, non-prescription drugs, or dietary supplements you use. Also tell them if you smoke, drink alcohol, or use illegal drugs. Some items may interact with your medicine. What should I watch for while using this medication? Visit your care team for regular checks on your progress. Tell your care team if your symptoms do not start to get better or if they get worse. You may need blood work while taking this medication. This medication may increase your risk of getting an infection. Call your care team for advice if you get a fever, chills, sore throat, or other symptoms of a cold or flu. Do not treat yourself. Try to avoid being around people who are sick. Avoid taking medications that contain aspirin, acetaminophen, ibuprofen, naproxen, or ketoprofen unless instructed by  your care team. These medications may hide a fever. Talk to your care  team if you wish to become pregnant or think you might be pregnant. This medication can cause serious birth defects. Do not breastfeed while taking this medication and for 18 hours after the last dose. Talk to your care team about your risk of cancer. You may be more at risk for certain types of cancer if you take this medication. Talk to your care team about your risk of skin cancer. You may be more at risk for skin cancer if you take this medication. This medication can make you more sensitive to the sun. Keep out of the sun. If you cannot avoid being in the sun, wear protective clothing and sunscreen. Do not use sun lamps, tanning beds, or tanning booths. What side effects may I notice from receiving this medication? Side effects that you should report to your care team as soon as possible: Allergic reactions--skin rash, itching, hives, swelling of the face, lips, tongue, or throat Blood clot in your leg--pain, swelling, or warmth in the leg Blood clot in your lungs--shortness of breath or chest pain Change in your skin, such as a new growth, a sore that doesn't heal, or a change in a mole Heart attack--pain or tightness in the chest, shoulders, arms, or jaw, nausea, shortness of breath, cold or clammy skin, feeling faint or lightheaded Infection--fever, chills, cough, sore throat, wounds that don't heal, pain or trouble when passing urine, general feeling of discomfort or being unwell Low red blood cell level--unusual weakness or fatigue, dizziness, headache, trouble breathing Stomach pain that is severe, does not go away, or gets worse Stroke--sudden numbness or weakness of the face, arm, or leg, trouble speaking, confusion, trouble walking, loss of balance or coordination, dizziness, severe headache, change in vision Side effects that usually do not require medical attention (report to your care team if they continue or are bothersome): Diarrhea Fever Headache Increase in blood  pressure Nausea Runny or stuffy nose Sore throat This list may not describe all possible side effects. Call your doctor for medical advice about side effects. You may report side effects to FDA at 1-800-FDA-1088. Where should I keep my medication? Keep out of the reach of children and pets. Store at room temperature between 20 and 25 degrees C (68 and 77 degrees F). Get rid of any unused medication after the expiration date. To get rid of medications that are no longer needed or have expired: Take the medication to a medication take-back program. Check with your pharmacy or law enforcement to find a location. If you cannot return the medication, check the label or package insert to see if the medication should be thrown out in the garbage or flushed down the toilet. If you are not sure, ask your care team. If it is safe to put it in the trash, empty the medication out of the container. Mix the medication with cat litter, dirt, coffee grounds, or other unwanted substance. Seal the mixture in a bag or container. Put it in the trash. NOTE: This sheet is a summary. It may not cover all possible information. If you have questions about this medicine, talk to your doctor, pharmacist, or health care provider.  2024 Elsevier/Gold Standard (2021-11-21 00:00:00)

## 2022-12-25 NOTE — Telephone Encounter (Addendum)
Pending OV note and baseline labs from today, please start Xeljanz BIV Dose: 11mg  ER once daily Dx: RA  ----- Message from Metta Clines, RT sent at 12/25/2022  9:11 AM EDT ----- Regarding: Tammy Garrison Start

## 2022-12-26 ENCOUNTER — Encounter: Payer: Self-pay | Admitting: Hematology

## 2022-12-26 LAB — COMPLETE METABOLIC PANEL WITH GFR
ALT: 42 U/L — ABNORMAL HIGH (ref 6–29)
Albumin: 3.6 g/dL (ref 3.6–5.1)
BUN: 16 mg/dL (ref 7–25)
Creat: 0.61 mg/dL (ref 0.50–0.97)
Glucose, Bld: 101 mg/dL — ABNORMAL HIGH (ref 65–99)

## 2022-12-26 LAB — HEPATITIS C ANTIBODY: Hepatitis C Ab: NONREACTIVE

## 2022-12-26 LAB — LIPID PANEL
Cholesterol: 170 mg/dL (ref <200)
LDL Cholesterol (Calc): 94 mg/dL (calc)
Total CHOL/HDL Ratio: 2.8 (calc) (ref <5.0)

## 2022-12-26 LAB — CBC WITH DIFFERENTIAL/PLATELET
Eosinophils Relative: 3.2 %
MCV: 83.4 fL (ref 80.0–100.0)

## 2022-12-26 LAB — HEPATITIS B SURFACE ANTIGEN: Hepatitis B Surface Ag: NONREACTIVE

## 2022-12-27 ENCOUNTER — Encounter: Payer: Self-pay | Admitting: Hematology

## 2022-12-27 LAB — CBC WITH DIFFERENTIAL/PLATELET
Absolute Monocytes: 852 cells/uL (ref 200–950)
Basophils Absolute: 21 cells/uL (ref 0–200)
Basophils Relative: 0.3 %
Eosinophils Absolute: 227 cells/uL (ref 15–500)
Hemoglobin: 11.5 g/dL — ABNORMAL LOW (ref 11.7–15.5)
MCHC: 31.9 g/dL — ABNORMAL LOW (ref 32.0–36.0)
MPV: 9.8 fL (ref 7.5–12.5)
Neutro Abs: 5148 cells/uL (ref 1500–7800)
Platelets: 335 10*3/uL (ref 140–400)
RDW: 17.1 % — ABNORMAL HIGH (ref 11.0–15.0)
WBC: 7.1 10*3/uL (ref 3.8–10.8)

## 2022-12-27 LAB — QUANTIFERON-TB GOLD PLUS
Mitogen-NIL: 6.09 IU/mL
NIL: 0.01 IU/mL
QuantiFERON-TB Gold Plus: NEGATIVE
TB1-NIL: 0 IU/mL
TB2-NIL: 0 IU/mL

## 2022-12-27 LAB — COMPLETE METABOLIC PANEL WITH GFR
AG Ratio: 0.9 (calc) — ABNORMAL LOW (ref 1.0–2.5)
AST: 17 U/L (ref 10–30)
Alkaline phosphatase (APISO): 101 U/L (ref 31–125)
CO2: 27 mmol/L (ref 20–32)
Calcium: 9.4 mg/dL (ref 8.6–10.2)
Chloride: 102 mmol/L (ref 98–110)
Globulin: 3.8 g/dL (calc) — ABNORMAL HIGH (ref 1.9–3.7)
Potassium: 4.1 mmol/L (ref 3.5–5.3)
Sodium: 137 mmol/L (ref 135–146)
Total Bilirubin: 0.6 mg/dL (ref 0.2–1.2)
Total Protein: 7.4 g/dL (ref 6.1–8.1)
eGFR: 123 mL/min/{1.73_m2} (ref >=60)

## 2022-12-27 LAB — LIPID PANEL
HDL: 60 mg/dL (ref >=50)
Non-HDL Cholesterol (Calc): 110 mg/dL (calc) (ref <130)
Triglycerides: 69 mg/dL (ref <150)

## 2022-12-27 LAB — HEPATITIS B CORE ANTIBODY, IGM: Hep B C IgM: NONREACTIVE

## 2023-01-04 DIAGNOSIS — L732 Hidradenitis suppurativa: Secondary | ICD-10-CM | POA: Insufficient documentation

## 2023-01-05 NOTE — Telephone Encounter (Signed)
Submitted a Prior Authorization request to [facility] MEDICAID for [facility] via CoverMyMeds. Will update once we receive a response.  Key: [id number]  [name], PharmD, MPH, BCPS, CPP Clinical Pharmacist (Rheumatology and Pulmonology)

## 2023-01-08 ENCOUNTER — Other Ambulatory Visit: Payer: Self-pay

## 2023-01-08 ENCOUNTER — Other Ambulatory Visit (HOSPITAL_COMMUNITY): Payer: Self-pay

## 2023-01-08 MED ORDER — XELJANZ XR 11 MG PO TB24
11.0000 mg | ORAL_TABLET | Freq: Every day | ORAL | 0 refills | Status: DC
Start: 2023-01-08 — End: 2023-04-02
  Filled 2023-01-08: qty 90, 90d supply, fill #0
  Filled 2023-01-11: qty 30, 30d supply, fill #0
  Filled 2023-02-02: qty 30, 30d supply, fill #1
  Filled 2023-02-25: qty 30, 30d supply, fill #2

## 2023-01-08 NOTE — Telephone Encounter (Signed)
Received notification from [facility] MEDICAID regarding a prior authorization for [facility]. Authorization has been APPROVED from 01/05/23 to 01/05/24. Approval letter sent to scan center.  Per test claim, copay for 30 days supply is $4  Patient can fill through [facility]: [phone]   Authorization # [id number]  Rx for Tammy Garrison sent to [facility].  [name], PharmD, MPH, BCPS, CPP Clinical Pharmacist (Rheumatology and Pulmonology)

## 2023-01-11 ENCOUNTER — Encounter: Payer: Self-pay | Admitting: Hematology

## 2023-01-11 ENCOUNTER — Other Ambulatory Visit: Payer: Self-pay

## 2023-01-11 ENCOUNTER — Other Ambulatory Visit (HOSPITAL_COMMUNITY): Payer: Self-pay

## 2023-01-11 ENCOUNTER — Inpatient Hospital Stay: Payer: Medicaid Other | Attending: Hematology

## 2023-01-11 DIAGNOSIS — Z79622 Long term (current) use of janus kinase inhibitor: Secondary | ICD-10-CM | POA: Diagnosis not present

## 2023-01-11 DIAGNOSIS — Z9221 Personal history of antineoplastic chemotherapy: Secondary | ICD-10-CM | POA: Diagnosis not present

## 2023-01-11 DIAGNOSIS — M069 Rheumatoid arthritis, unspecified: Secondary | ICD-10-CM | POA: Diagnosis not present

## 2023-01-11 DIAGNOSIS — Z79899 Other long term (current) drug therapy: Secondary | ICD-10-CM | POA: Insufficient documentation

## 2023-01-11 DIAGNOSIS — Z85048 Personal history of other malignant neoplasm of rectum, rectosigmoid junction, and anus: Secondary | ICD-10-CM | POA: Insufficient documentation

## 2023-01-11 DIAGNOSIS — C210 Malignant neoplasm of anus, unspecified: Secondary | ICD-10-CM

## 2023-01-11 DIAGNOSIS — Z7952 Long term (current) use of systemic steroids: Secondary | ICD-10-CM | POA: Diagnosis not present

## 2023-01-11 DIAGNOSIS — D500 Iron deficiency anemia secondary to blood loss (chronic): Secondary | ICD-10-CM | POA: Diagnosis not present

## 2023-01-11 LAB — CBC WITH DIFFERENTIAL/PLATELET
Abs Immature Granulocytes: 0.15 10*3/uL — ABNORMAL HIGH (ref 0.00–0.07)
Basophils Absolute: 0 10*3/uL (ref 0.0–0.1)
Basophils Relative: 0 %
Eosinophils Absolute: 0.1 10*3/uL (ref 0.0–0.5)
Eosinophils Relative: 1 %
HCT: 34.1 % — ABNORMAL LOW (ref 36.0–46.0)
Hemoglobin: 10.7 g/dL — ABNORMAL LOW (ref 12.0–15.0)
Immature Granulocytes: 1 %
Lymphocytes Relative: 14 %
Lymphs Abs: 1.4 10*3/uL (ref 0.7–4.0)
MCH: 26.2 pg (ref 26.0–34.0)
MCHC: 31.4 g/dL (ref 30.0–36.0)
MCV: 83.4 fL (ref 80.0–100.0)
Monocytes Absolute: 0.9 10*3/uL (ref 0.1–1.0)
Monocytes Relative: 9 %
Neutro Abs: 8.1 10*3/uL — ABNORMAL HIGH (ref 1.7–7.7)
Neutrophils Relative %: 75 %
Platelets: 422 10*3/uL — ABNORMAL HIGH (ref 150–400)
RBC: 4.09 MIL/uL (ref 3.87–5.11)
RDW: 17.1 % — ABNORMAL HIGH (ref 11.5–15.5)
WBC: 10.7 10*3/uL — ABNORMAL HIGH (ref 4.0–10.5)
nRBC: 0 % (ref 0.0–0.2)

## 2023-01-11 LAB — COMPREHENSIVE METABOLIC PANEL
ALT: 11 U/L (ref 0–44)
AST: 9 U/L — ABNORMAL LOW (ref 15–41)
Albumin: 3.5 g/dL (ref 3.5–5.0)
Alkaline Phosphatase: 75 U/L (ref 38–126)
Anion gap: 5 (ref 5–15)
BUN: 16 mg/dL (ref 6–20)
CO2: 27 mmol/L (ref 22–32)
Calcium: 9.2 mg/dL (ref 8.9–10.3)
Chloride: 106 mmol/L (ref 98–111)
Creatinine, Ser: 0.64 mg/dL (ref 0.44–1.00)
GFR, Estimated: >60 mL/min (ref >60)
Glucose, Bld: 122 mg/dL — ABNORMAL HIGH (ref 70–99)
Potassium: 3.7 mmol/L (ref 3.5–5.1)
Sodium: 138 mmol/L (ref 135–145)
Total Bilirubin: 0.3 mg/dL (ref 0.3–1.2)
Total Protein: 7.6 g/dL (ref 6.5–8.1)

## 2023-01-11 LAB — FERRITIN: Ferritin: 67 ng/mL (ref 11–307)

## 2023-01-11 NOTE — Telephone Encounter (Signed)
Called patient to discuss starting XELJANZ and $4 copay. Told patient to finish prednisone taper while starting Harriette Ohara and that she will be contacted to schedule shipment to her address.   Routing to [place] for onboarding.

## 2023-01-11 NOTE — Telephone Encounter (Signed)
Delivery instructions have been updated in [place], medication will be shipped to patient's home address on 01/12/23.  Rx has been processed in [facility] and there is a copay of $4.00 which pt would like billed to the [facility] account.

## 2023-01-12 ENCOUNTER — Ambulatory Visit (HOSPITAL_COMMUNITY)
Admission: RE | Admit: 2023-01-12 | Discharge: 2023-01-12 | Disposition: A | Discharge status: Home / Self Care | Payer: Medicaid Other | Source: Ambulatory Visit | Attending: Hematology | Admitting: Hematology

## 2023-01-12 DIAGNOSIS — C210 Malignant neoplasm of anus, unspecified: Secondary | ICD-10-CM | POA: Insufficient documentation

## 2023-01-12 MED ORDER — SODIUM CHLORIDE (PF) 0.9 % IJ SOLN
INTRAMUSCULAR | Status: AC
  Filled 2023-01-12: qty 50

## 2023-01-12 MED ORDER — IOHEXOL 300 MG/ML  SOLN
100.0000 mL | Freq: Once | INTRAMUSCULAR | Status: AC | PRN
  Administered 2023-01-12: 100 mL via INTRAVENOUS

## 2023-01-15 ENCOUNTER — Inpatient Hospital Stay (HOSPITAL_BASED_OUTPATIENT_CLINIC_OR_DEPARTMENT_OTHER): Payer: Medicaid Other | Admitting: Hematology

## 2023-01-15 ENCOUNTER — Encounter: Payer: Self-pay | Admitting: Hematology

## 2023-01-15 ENCOUNTER — Other Ambulatory Visit: Payer: Self-pay

## 2023-01-15 ENCOUNTER — Other Ambulatory Visit (HOSPITAL_COMMUNITY): Payer: Self-pay

## 2023-01-15 VITALS — BP 151/89 | HR 126 | Temp 98.4°F | Resp 18 | Wt 284.6 lb

## 2023-01-15 DIAGNOSIS — C210 Malignant neoplasm of anus, unspecified: Secondary | ICD-10-CM | POA: Diagnosis not present

## 2023-01-15 DIAGNOSIS — Z85048 Personal history of other malignant neoplasm of rectum, rectosigmoid junction, and anus: Secondary | ICD-10-CM | POA: Diagnosis not present

## 2023-01-15 NOTE — Progress Notes (Signed)
[facility]   [phone] [phone]   Clinic Follow up Note   Patient Care Team: Pa, [facility] as PCP - General (Internal Medicine) Rossie Muskrat, MD (Rheumatology) Ranae Pila, MD as Consulting Physician (Obstetrics and Gynecology) Malachy Mood, MD as Consulting Physician (Hematology and Oncology)  Date of Service:  01/15/2023  CHIEF COMPLAINT: f/u of anal cancer   CURRENT THERAPY: Surveillance    ASSESSMENT:  Tammy Garrison is a 32 y.o. female with    Anal squamous cell carcinoma (HCC) cT2N0Mx, with hypermetabolic left axillary nodes, HPV(+) -presented with worsening rectal bleeding, began passing clots and was admitted 02/06/22 for anemia from blood loss. S/p hemorrhoidectomy on 02/08/22 by Dr. 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 -PET scan 02/26/22 showed: intense uptake to anus; no signs of solid organ or FDG-avid nodal metastasis within abdomen or pelvis, hypermetabolic left axillary lymph nodes.  Sequently left axillary lymph node biopsy was benign. -she established care with Dr. Cliffton Asters on 03/03/22. -s/p concurrent chemoRT with mitomycin/5FU 03/09/22 - 04/20/22, tolerated moderately well with significant rectal pain. -Restaging PET scan on July 08, 2022 showed persistent increased radiotracer uptake associated with the anus, suspicious for residual disease.  She also had persistent significant anal pain.  She underwent exam and a biopsy by Dr. Cliffton Asters in [place] on July 21, 2022, which was negative for residual disease. -Her rectal pain has resolved, no clinical concern for residual cancer -I reviewed her separating CT scan from January 07, 2023, which showed new enlarged right and left axillary and subpectoral lymph nodes, and numerous mildly enlarged retroperitoneal and right iliac lymph nodes, just stable from last CT scan.   She does have rheumatoid arthritis with diffuse joint pain, her adenopathy are certainly possible reactive, however given her history of anal cancer, I will obtain a PET scan for further evaluation, to see if any need biopsy one of her lymph nodes. -She is not able to tolerate rectal exam due to the pain.  Her other physical exam are unremarkable -Lab reviewed, no concerns, -Follow-up with a phone visit after PET scan     Rheumatoid arthritis -She has history of rheumatoid arthritis, previous seen by rheumatology before she moved to [facility] -she follows up with Dr. Dimple Casey now       PLAN: -lab reviewed- mild anemia -CMP- normal -I recommend pt to see GYN for hot flashes - I order restaging  PET scan for 7/1 -f/u phone visit  7/8    SUMMARY OF ONCOLOGIC HISTORY: Oncology History Overview Note   Cancer Staging  Anal squamous cell carcinoma (HCC) Staging form: Anus, AJCC V9 - Clinical stage from 02/08/2022: Stage IIA (cT2, cN0, cM0) - Signed by Malachy Mood, MD on 02/23/2022 Stage prefix: Initial diagnosis     Anal squamous cell carcinoma (HCC)  02/08/2022 Pathology Results   FINAL MICROSCOPIC DIAGNOSIS:   A. SOFT TISSUE, ANTERIOR MASS VS. HEMORRHOID, EXCISION:  Invasive squamous cell carcinoma, focally keratinizing and  well-differentiated.  Carcinoma is present at the margins of excision.   B. HEMORRHOID, INTERNAL, EXCISION:  Hemorrhoid without the overlying epithelium.  Separate fragment of benign squamous epithelium.  Negative for neoplasm.   ADDENDUM:  P16 immunostain is strongly positive suggestive of this lesion being the HPV related.    02/08/2022 Cancer Staging   Staging form: Anus, AJCC V9 - Clinical stage from 02/08/2022: Stage IIA (cT2, cN0,  cM0) - Signed by Malachy Mood, MD on 02/23/2022 Stage prefix: Initial diagnosis   02/23/2022 Initial Diagnosis   Anal cancer (HCC)   03/09/2022 - 03/13/2022 Chemotherapy   Patient is on Treatment Plan : ANUS Mitomycin D1,28 / 5FU  D1-4, 28-31 q32d     03/09/2022 -  Chemotherapy   Patient is on Treatment Plan : ANUS Mitomycin D1,28 + 5FU D1-4, 28-31 q32d      PET scan   IMPRESSION: 1. There is persistent increased radiotracer uptake associated with the anus. The degree of uptake is similar to the previous exam. Cannot exclude residual FDG avid tumor. 2. No signs of solid organ or nodal metastasis within the chest, abdomen or pelvis. 3. Interval resolution of previous FDG avid left axillary lymph nodes. 4. Interval increase in radiotracer uptake associated with anterior mediastinal soft tissue. The configuration of the soft tissue is consistent with the thymus gland. 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 5. Interval development of multiple FDG avid cutaneous soft tissue nodules within the anterior left shoulder, ventral chest wall, and anteromedial left thigh. These are nonspecific and may be inflammatory or infectious in etiology. Metastatic disease is considered less favored. Correlation with physical exam findings is advised. 6. Enlarged uterus containing degenerating fibroids.      INTERVAL HISTORY:  Tammy Garrison is here for a follow up of anal cancer . She was last seen by me on 08/20/2022. She presents to the clinic alone. Pt state that she is having a lot of joint pain which comes from her arthritis and she is on a medication for it. Far as he cancer standpoint pt  state that she is doing well. Pt state that she has some chill and night sweats like hot flashes. Pt state that it interfere with her sleep.   All other systems were reviewed with the patient and are negative.  MEDICAL HISTORY:  Past Medical History:  Diagnosis Date   Anemia    Bipolar 1 disorder (HCC)    Cancer (HCC)    squamous cell rectal cancer   ETOH abuse    H/O self-harm    H/O suicide attempt    x 5 - last 05/2016 - overdose Ibuprofen   Marijuana abuse     Rheumatoid arthritis (HCC)     SURGICAL HISTORY: Past Surgical History:  Procedure Laterality Date   HEMORRHOID SURGERY N/A 02/08/2022   Procedure: EXTERNAL AND INTERNAL HEMORRHOIDECTOMY;  Surgeon: Violeta Gelinas, MD;  Location: [facility] OR;  Service: General;  Laterality: N/A;   INCISION AND DRAINAGE ABSCESS Right 04/22/2022   Procedure: INCISION AND DRAINAGE axilla;  Surgeon: Sheliah Hatch, De Blanch, MD;  Location: [facility] ORS;  Service: General;  Laterality: Right;   RECTAL EXAM UNDER ANESTHESIA N/A 07/21/2022   Procedure: RECTAL EXAM UNDER ANESTHESIA;  Surgeon: Andria Meuse, MD;  Location: [facility] OR;  Service: General;  Laterality: N/A;   TRANSANAL HEMORRHOIDAL DEARTERIALIZATION N/A 07/21/2022   Procedure: ANAL CANAL BIOPSY;  Surgeon: Andria Meuse, MD;  Location: [facility] OR;  Service: General;  Laterality: N/A;    I have reviewed the social history and family history with the patient and they are unchanged from previous note.  ALLERGIES:  is allergic to coconut flavor.  MEDICATIONS:  Current Outpatient Medications  Medication Sig Dispense Refill   acetaminophen (TYLENOL) 325 MG tablet Take 2 tablets (650 mg total) by mouth every 6 (six) hours as needed for mild pain.  gabapentin (NEURONTIN) 300 MG capsule Take 1 capsule (300 mg total) by mouth 3 (three) times daily. 90 capsule 2   mirtazapine (REMERON) 15 MG tablet Take 1 tablet (15 mg total) by mouth at bedtime. 30 tablet 2   pantoprazole (PROTONIX) 40 MG tablet Take 1 tablet (40 mg total) by mouth daily. (Patient not taking: Reported on 12/25/2022) 30 tablet 1   polyethylene glycol (MIRALAX / GLYCOLAX) 17 g packet Take 17 g by mouth daily as needed for mild constipation.     predniSONE (DELTASONE) 10 MG tablet Take 3 tablets by mouth daily with breakfast for 14 days, THEN take 2 tablets daily with breakfast for 14 days, THEN take 1 tablet daily with breakfast for 28 days. 98 tablet 0   Tofacitinib Citrate ER (XELJANZ XR) 11 MG TB24  Take 1 tablet (11 mg total) by mouth daily. 90 tablet 0   No current facility-administered medications for this visit.   Facility-Administered Medications Ordered in Other Visits  Medication Dose Route Frequency Provider Last Rate Last Admin   0.9 %  sodium chloride infusion (Manually program via Guardrails IV Fluids)  250 mL Intravenous Once Malachy Mood, MD       heparin lock flush 100 unit/mL  250 Units Intracatheter Once Malachy Mood, MD       sodium chloride flush (NS) 0.9 % injection 10 mL  10 mL Intracatheter Once Malachy Mood, MD       sodium chloride flush (NS) 0.9 % injection 10 mL  10 mL Intracatheter Once Malachy Mood, MD       sodium chloride flush (NS) 0.9 % injection 10 mL  10 mL Intracatheter Once Malachy Mood, MD        PHYSICAL EXAMINATION: ECOG PERFORMANCE STATUS: 2 - Symptomatic, <50% confined to bed  Vitals:   01/15/23 1059  BP: (!) 151/89  Pulse: (!) 126  Resp: 18  Temp: 98.4 F (36.9 C)  SpO2: 98%   Wt Readings from Last 3 Encounters:  01/15/23 284 lb 9.6 oz (129.1 kg)  12/25/22 271 lb (122.9 kg)  11/29/22 257 lb 4.4 oz (116.7 kg)     GENERAL:alert, no distress and comfortable SKIN: skin color normal, no rashes or significant lesions EYES: normal, Conjunctiva are pink and non-injected, sclera clear  NEURO: alert & oriented x 3 with fluent speech ECK:(-)  supple, thyroid normal size, non-tender, without nodularity LYMPH: (-) no palpable lymphadenopathy in the cervical, axillary   LABORATORY DATA:  I have reviewed the data as listed    Latest Ref Rng & Units 01/11/2023    9:39 AM 12/25/2022    9:03 AM 11/30/2022    6:56 AM  CBC  WBC 4.0 - 10.5 K/uL 10.7  7.1  11.7   Hemoglobin 12.0 - 15.0 g/dL 16.1  09.6  9.7   Hematocrit 36.0 - 46.0 % 34.1  36.1  32.2   Platelets 150 - 400 K/uL 422  335  615         Latest Ref Rng & Units 01/11/2023    9:39 AM 12/25/2022    9:03 AM 11/30/2022    6:56 AM  CMP  Glucose 70 - 99 mg/dL 045  409  811   BUN 6 - 20 mg/dL 16  16   11    Creatinine 0.44 - 1.00 mg/dL 9.14  7.82  9.56   Sodium 135 - 145 mmol/L 138  137  136   Potassium 3.5 - 5.1 mmol/L 3.7  4.1  3.7  Chloride 98 - 111 mmol/L 106  102  103   CO2 22 - 32 mmol/L 27  27  24    Calcium 8.9 - 10.3 mg/dL 9.2  9.4  9.0   Total Protein 6.5 - 8.1 g/dL 7.6  7.4    Total Bilirubin 0.3 - 1.2 mg/dL 0.3  0.6    Alkaline Phos 38 - 126 U/L 75     AST 15 - 41 U/L 9  17    ALT 0 - 44 U/L 11  42        RADIOGRAPHIC STUDIES: I have personally reviewed the radiological images as listed and agreed with the findings in the report. No results found.    Orders Placed This Encounter  Procedures   NM PET Image Initial (PI) Skull Base To Thigh    Standing Status:   Future    Standing Expiration Date:   01/15/2024    Order Specific Question:   If indicated for the ordered procedure, I authorize the administration of a radiopharmaceutical per Radiology protocol    Answer:   Yes    Order Specific Question:   Is the patient pregnant?    Answer:   No    Order Specific Question:   Preferred imaging location?    Answer:   [facility]    Order Specific Question:   Release to patient    Answer:   Immediate   All questions were answered. The patient knows to call the clinic with any problems, questions or concerns. No barriers to learning was detected. The total time spent in the appointment was 30 minutes.     Malachy Mood, MD 01/15/2023   Carolin Coy, CMA, am acting as scribe for Malachy Mood, MD.   I have reviewed the above documentation for accuracy and completeness, and I agree with the above.

## 2023-01-27 ENCOUNTER — Other Ambulatory Visit: Payer: Self-pay

## 2023-01-27 ENCOUNTER — Emergency Department (HOSPITAL_COMMUNITY)
Admission: EM | Admit: 2023-01-27 | Discharge: 2023-01-27 | Disposition: A | Discharge status: Home / Self Care | Payer: Medicaid Other | Attending: Emergency Medicine | Admitting: Emergency Medicine

## 2023-01-27 ENCOUNTER — Encounter (HOSPITAL_COMMUNITY): Payer: Self-pay

## 2023-01-27 ENCOUNTER — Emergency Department (HOSPITAL_COMMUNITY): Payer: Medicaid Other

## 2023-01-27 DIAGNOSIS — W108XXA Fall (on) (from) other stairs and steps, initial encounter: Secondary | ICD-10-CM | POA: Diagnosis not present

## 2023-01-27 DIAGNOSIS — M25571 Pain in right ankle and joints of right foot: Secondary | ICD-10-CM | POA: Diagnosis present

## 2023-01-27 DIAGNOSIS — S0990XA Unspecified injury of head, initial encounter: Secondary | ICD-10-CM | POA: Insufficient documentation

## 2023-01-27 DIAGNOSIS — Z85048 Personal history of other malignant neoplasm of rectum, rectosigmoid junction, and anus: Secondary | ICD-10-CM | POA: Diagnosis not present

## 2023-01-27 DIAGNOSIS — S93401A Sprain of unspecified ligament of right ankle, initial encounter: Secondary | ICD-10-CM | POA: Diagnosis not present

## 2023-01-27 DIAGNOSIS — H539 Unspecified visual disturbance: Secondary | ICD-10-CM | POA: Diagnosis not present

## 2023-01-27 DIAGNOSIS — S93402A Sprain of unspecified ligament of left ankle, initial encounter: Secondary | ICD-10-CM | POA: Insufficient documentation

## 2023-01-27 NOTE — ED Provider Notes (Signed)
[place] EMERGENCY DEPARTMENT AT [facility] Provider Note   CSN: [id number] Arrival date & time: 01/27/23  1231     History  Chief Complaint  Patient presents with   Fall   Foot Pain    Tammy Garrison is a 32 y.o. female.  Tammy Garrison is a 32 year female with a history of RA, anal cancer presents to the ED after falling 3 days ago. Patient states she remembers tripping on her stairs but can't recall about "2 minutes" after falling. Patient states she is unsure if she hit her head or LOC. Patient reports for the past 2 days she's had intermittent blurry vision in her left eye. Patient states she has 5/10 right sided ankle pain and 7/10 left sided ankle/foot pain. Patient denies loss of sensation, ROM or numbness/tingling. Patient states she's been taking naproxen and tylenol for her pain which has provided some relief. Patient says she always walks with a cane because of her RA, but walking has been more difficult due to pain and swelling in both ankles.  The history is provided by the patient. No language interpreter was used.  Fall This is a new problem. The problem occurs constantly. Nothing aggravates the symptoms. Nothing relieves the symptoms. She has tried nothing for the symptoms.  Foot Pain       Home Medications Prior to Admission medications   Medication Sig Start Date End Date Taking? Authorizing Provider  acetaminophen (TYLENOL) 325 MG tablet Take 2 tablets (650 mg total) by mouth every 6 (six) hours as needed for mild pain. 12/01/22   Standley Brooking, MD  gabapentin (NEURONTIN) 300 MG capsule Take 1 capsule (300 mg total) by mouth 3 (three) times daily. 10/01/22   Pickenpack-Cousar, Arty Baumgartner, NP  mirtazapine (REMERON) 15 MG tablet Take 1 tablet (15 mg total) by mouth at bedtime. 12/11/22   Pickenpack-Cousar, Arty Baumgartner, NP  pantoprazole (PROTONIX) 40 MG tablet Take 1 tablet (40 mg total) by mouth daily. Patient not taking: Reported on  12/25/2022 12/01/22 01/30/23  Standley Brooking, MD  polyethylene glycol (MIRALAX / GLYCOLAX) 17 g packet Take 17 g by mouth daily as needed for mild constipation. 12/01/22   Standley Brooking, MD  predniSONE (DELTASONE) 10 MG tablet Take 3 tablets by mouth daily with breakfast for 14 days, THEN take 2 tablets daily with breakfast for 14 days, THEN take 1 tablet daily with breakfast for 28 days. 12/25/22 02/19/23  Fuller Plan, MD  Tofacitinib Citrate ER (XELJANZ XR) 11 MG TB24 Take 1 tablet (11 mg total) by mouth daily. 01/08/23   Rice, Jamesetta Orleans, MD      Allergies    Coconut flavor    Review of Systems   Review of Systems  All other systems reviewed and are negative.   Physical Exam Updated Vital Signs BP (!) 146/95 (BP Location: Right Arm)   Pulse (!) 111   Temp 98.3 F (36.8 C) (Oral)   Resp 17   Ht 5\' 9"  (1.753 m)   Wt 131.1 kg   SpO2 100%   BMI 42.68 kg/m  Physical Exam Vitals reviewed.  Constitutional:      Appearance: Normal appearance.  HENT:     Head: Normocephalic and atraumatic.     Nose: Nose normal.     Mouth/Throat:     Mouth: Mucous membranes are moist.  Eyes:     Extraocular Movements: Extraocular movements intact.     Conjunctiva/sclera: Conjunctivae normal.  Pupils: Pupils are equal, round, and reactive to light.  Cardiovascular:     Rate and Rhythm: Normal rate.  Pulmonary:     Effort: Pulmonary effort is normal.  Musculoskeletal:        General: Swelling and tenderness present.     Cervical back: Normal range of motion.  Skin:    General: Skin is warm.  Neurological:     General: No focal deficit present.     Mental Status: She is alert.  Psychiatric:        Mood and Affect: Mood normal.     ED Results / Procedures / Treatments   Labs (all labs ordered are listed, but only abnormal results are displayed) Labs Reviewed - No data to display  EKG None  Radiology CT Head Wo Contrast  Result Date: 01/27/2023 CLINICAL DATA:   Head trauma, fall EXAM: CT HEAD WITHOUT CONTRAST TECHNIQUE: Contiguous axial images were obtained from the base of the skull through the vertex without intravenous contrast. RADIATION DOSE REDUCTION: This exam was performed according to the departmental dose-optimization program which includes automated exposure control, adjustment of the mA and/or kV according to patient size and/or use of iterative reconstruction technique. COMPARISON:  05/18/2016 FINDINGS: Brain: No evidence of acute infarction, hemorrhage, mass, mass effect, or midline shift. No hydrocephalus or extra-axial fluid collection. Vascular: No hyperdense vessel. Skull: Negative for fracture or focal lesion. Sinuses/Orbits: Minimal air-fluid level in the right maxillary sinus. Otherwise clear paranasal sinuses. No acute finding in the orbits. Other: The mastoid air cells are well aerated. IMPRESSION: No acute intracranial process. Electronically Signed   By: Wiliam Ke M.D.   On: 01/27/2023 13:55   DG Ankle Complete Right  Result Date: 01/27/2023 CLINICAL DATA:  Fall, right ankle pain EXAM: RIGHT ANKLE - COMPLETE 3+ VIEW COMPARISON:  None Available. FINDINGS: There is no evidence of fracture, dislocation, or joint effusion. There is no evidence of arthropathy or other focal bone abnormality. Marked soft tissue swelling about the right ankle. IMPRESSION: No acute fracture or dislocation of the right ankle. Marked soft tissue swelling. Electronically Signed   By: Larose Hires D.O.   On: 01/27/2023 13:26   DG Foot Complete Left  Result Date: 01/27/2023 CLINICAL DATA:  Fall.  Patient tripped. EXAM: LEFT ANKLE COMPLETE - 3+ VIEW; LEFT FOOT - COMPLETE 3+ VIEW COMPARISON:  None Available. FINDINGS: Left ankle: There is no evidence of fracture, dislocation, or joint effusion. There is no evidence of arthropathy or other focal bone abnormality. Soft tissues are unremarkable. Left foot: There is no evidence of fracture, dislocation or joint effusion.  Soft tissue swelling about the dorsum of the foot. IMPRESSION: 1. No evidence of fracture or dislocation. 2. Soft tissue swelling about the dorsum of the foot. Electronically Signed   By: Larose Hires D.O.   On: 01/27/2023 13:25   DG Ankle Complete Left  Result Date: 01/27/2023 CLINICAL DATA:  Fall.  Patient tripped. EXAM: LEFT ANKLE COMPLETE - 3+ VIEW; LEFT FOOT - COMPLETE 3+ VIEW COMPARISON:  None Available. FINDINGS: Left ankle: There is no evidence of fracture, dislocation, or joint effusion. There is no evidence of arthropathy or other focal bone abnormality. Soft tissues are unremarkable. Left foot: There is no evidence of fracture, dislocation or joint effusion. Soft tissue swelling about the dorsum of the foot. IMPRESSION: 1. No evidence of fracture or dislocation. 2. Soft tissue swelling about the dorsum of the foot. Electronically Signed   By: Miles Costain.O.  On: 01/27/2023 13:25    Procedures Procedures    Medications Ordered in ED Medications - No data to display  ED Course/ Medical Decision Making/ A&P                             Medical Decision Making Patient reports she fell down 2 days ago patient complains of swelling and pain in both ankles.  Patient is unsure if she hit her head she has had some blurred vision on and off in her right eye  Amount and/or Complexity of Data Reviewed Radiology: ordered and independent interpretation performed. Decision-making details documented in ED Course.    Details: X-ray bilateral ankles shows no evidence of fracture CT head shows no acute abnormality  Risk Risk Details: Patient advised to follow-up with ophthalmology for eye evaluation.  Patient is given the phone number for Dr. Eulah Pont orthopedist on-call for follow-up of bilateral ankle sprains.           Final Clinical Impression(s) / ED Diagnoses Final diagnoses:  Sprain of right ankle, unspecified ligament, initial encounter  Sprain of left ankle, unspecified  ligament, initial encounter  Visual changes    Rx / DC Orders ED Discharge Orders     None      An After Visit Summary was printed and given to the patient.    Elson Areas, [place] 01/27/23 1432    Vanetta Mulders, MD 01/27/23 [phone]

## 2023-01-27 NOTE — Progress Notes (Signed)
Orthopedic Tech Progress Note Patient Details:  Tammy Garrison 04-10-1991 [id number]  Ortho Devices Type of Ortho Device: ASO Ortho Device/Splint Location: bi  lat Ortho Device/Splint Interventions: Ordered, Application, Adjustment   Post Interventions Patient Tolerated: Well Instructions Provided: Adjustment of device, Care of device  Kizzie Fantasia 01/27/2023, 2:14 PM

## 2023-01-27 NOTE — ED Triage Notes (Signed)
Fall on Sunday, pt tripped. Pt states she started hurting the next day in her left foot/ankle and her right foot is swollen. Hx of RA

## 2023-02-02 ENCOUNTER — Other Ambulatory Visit (HOSPITAL_COMMUNITY): Payer: Self-pay

## 2023-02-05 ENCOUNTER — Other Ambulatory Visit: Payer: Self-pay

## 2023-02-05 ENCOUNTER — Ambulatory Visit (HOSPITAL_COMMUNITY)
Admission: RE | Admit: 2023-02-05 | Discharge: 2023-02-05 | Disposition: A | Discharge status: Home / Self Care | Payer: Medicaid Other | Source: Ambulatory Visit | Attending: Hematology | Admitting: Hematology

## 2023-02-05 DIAGNOSIS — C210 Malignant neoplasm of anus, unspecified: Secondary | ICD-10-CM | POA: Diagnosis present

## 2023-02-05 LAB — GLUCOSE, CAPILLARY: Glucose-Capillary: 117 mg/dL — ABNORMAL HIGH (ref 70–99)

## 2023-02-05 MED ORDER — FLUDEOXYGLUCOSE F - 18 (FDG) INJECTION
14.4000 | Freq: Once | INTRAVENOUS | Status: AC
  Administered 2023-02-05: 14.4 via INTRAVENOUS

## 2023-02-10 ENCOUNTER — Other Ambulatory Visit: Payer: Self-pay | Admitting: Nurse Practitioner

## 2023-02-10 ENCOUNTER — Other Ambulatory Visit (HOSPITAL_COMMUNITY): Payer: Self-pay

## 2023-02-10 ENCOUNTER — Encounter: Payer: Self-pay | Admitting: Hematology

## 2023-02-10 ENCOUNTER — Inpatient Hospital Stay: Payer: Medicaid Other | Attending: Hematology | Admitting: Hematology

## 2023-02-10 ENCOUNTER — Other Ambulatory Visit: Payer: Self-pay | Admitting: Internal Medicine

## 2023-02-10 DIAGNOSIS — C210 Malignant neoplasm of anus, unspecified: Secondary | ICD-10-CM

## 2023-02-10 DIAGNOSIS — M0579 Rheumatoid arthritis with rheumatoid factor of multiple sites without organ or systems involvement: Secondary | ICD-10-CM

## 2023-02-10 DIAGNOSIS — M792 Neuralgia and neuritis, unspecified: Secondary | ICD-10-CM

## 2023-02-10 DIAGNOSIS — Z515 Encounter for palliative care: Secondary | ICD-10-CM

## 2023-02-10 DIAGNOSIS — M069 Rheumatoid arthritis, unspecified: Secondary | ICD-10-CM | POA: Diagnosis not present

## 2023-02-10 NOTE — Assessment & Plan Note (Signed)
-  She has history of rheumatoid arthritis, previous seen by rheumatology before she moved to [facility] -she follows up with Dr. Dimple Casey now

## 2023-02-10 NOTE — Progress Notes (Signed)
[facility]   Telephone:([phone]) [phone] Fax:(336) [phone]   Clinic Follow up Note   Patient Care Team: Pa, [facility] as PCP - General (Internal Medicine) Rossie Muskrat, MD (Rheumatology) Ranae Pila, MD as Consulting Physician (Obstetrics and Gynecology) Malachy Mood, MD as Consulting Physician (Hematology and Oncology)  Date of Service:  02/10/2023  I connected with Skeet Latch on 02/10/2023 at 10:00 AM EDT by telephone visit and verified that I am speaking with the correct person using two identifiers.  I discussed the limitations, risks, security and privacy concerns of performing an evaluation and management service by telephone and the availability of in person appointments. I also discussed with the patient that there may be a patient responsible charge related to this service. The patient expressed understanding and agreed to proceed.   Other persons participating in the visit and their role in the encounter:  no  Patient's location:  Home Provider's location:  [facility] Office  CHIEF COMPLAINT: f/u of anal cancer   CURRENT THERAPY:  Surveillance   ASSESSMENT & PLAN:  Tammy Garrison is a 32 y.o. female with    Anal squamous cell carcinoma (HCC) cT2N0Mx, with hypermetabolic left axillary nodes, HPV(+) -presented with worsening rectal bleeding, began passing clots and was admitted 02/06/22 for anemia from blood loss. S/p hemorrhoidectomy on 02/08/22 by Dr. 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 -PET scan 02/26/22 showed: intense uptake to anus; no signs of solid organ or FDG-avid nodal metastasis within abdomen or pelvis, hypermetabolic left axillary lymph nodes.  Sequently left axillary lymph node biopsy was benign. -she established care with Dr. Cliffton Asters on 03/03/22. -s/p concurrent chemoRT with mitomycin/5FU 03/09/22 - 04/20/22,  tolerated moderately well with significant rectal pain. -Restaging PET scan on July 08, 2022 showed persistent increased radiotracer uptake associated with the anus, suspicious for residual disease.  She also had persistent significant anal pain.  She underwent exam and a biopsy by Dr. Cliffton Asters in OR on July 21, 2022, which was negative for residual disease. -Her rectal pain has resolved, no clinical concern for residual cancer -I reviewed her separating CT scan from January 07, 2023, which showed new enlarged right and left axillary and subpectoral lymph nodes, and numerous mildly enlarged retroperitoneal and right iliac lymph nodes, just stable from last CT scan.  She does have rheumatoid arthritis with diffuse joint pain, her adenopathy are certainly possible reactive, however given her history of anal cancer, I will obtain a PET scan for further evaluation, to see if any need biopsy one of her lymph nodes. -PET 02/05/23 showed persistent increased radiotracer uptake associated with the anus compatible with known anal cancer. Hypermetabolic bilateral axillary, bilateral subpectoral, right supraclavicular, retroperitoneal and right iliac side chain lymph nodes concerning for nodal metastatic disease. -I recommend IR node biopsy, she agrees  -I encourage her to f/u with Dr. Cliffton Asters and have anal scopy exam under anesthesia    Rheumatoid arthritis (HCC) -She has history of rheumatoid arthritis, previous seen by rheumatology before she moved to [facility] -she follows up with Dr. Dimple Casey now    PLAN: -reviewed PET scan 7/5-suspicious for cancer progression -f/u with Dr. Dimple Casey 7/25 -Will Reach out to Dr. Cliffton Asters for anal scope under general anesthesia and biopsy, will also discussed with IR for lymph node biopsy  SUMMARY OF ONCOLOGIC HISTORY: Oncology History Overview Note   Cancer Staging  Anal squamous cell  carcinoma [facility]) Staging form: Anus, AJCC V9 - Clinical stage from 02/08/2022: Stage IIA (cT2, cN0,  cM0) - Signed by Malachy Mood, MD on 02/23/2022 Stage prefix: Initial diagnosis     Anal squamous cell carcinoma (HCC)  02/08/2022 Pathology Results   FINAL MICROSCOPIC DIAGNOSIS:   A. SOFT TISSUE, ANTERIOR MASS VS. HEMORRHOID, EXCISION:  Invasive squamous cell carcinoma, focally keratinizing and  well-differentiated.  Carcinoma is present at the margins of excision.   B. HEMORRHOID, INTERNAL, EXCISION:  Hemorrhoid without the overlying epithelium.  Separate fragment of benign squamous epithelium.  Negative for neoplasm.   ADDENDUM:  P16 immunostain is strongly positive suggestive of this lesion being the HPV related.    02/08/2022 Cancer Staging   Staging form: Anus, AJCC V9 - Clinical stage from 02/08/2022: Stage IIA (cT2, cN0, cM0) - Signed by Malachy Mood, MD on 02/23/2022 Stage prefix: Initial diagnosis   02/23/2022 Initial Diagnosis   Anal cancer (HCC)   03/09/2022 - 03/13/2022 Chemotherapy   Patient is on Treatment Plan : ANUS Mitomycin D1,28 / 5FU D1-4, 28-31 q32d     03/09/2022 -  Chemotherapy   Patient is on Treatment Plan : ANUS Mitomycin D1,28 + 5FU D1-4, 28-31 q32d      PET scan   IMPRESSION: 1. There is persistent increased radiotracer uptake associated with the anus. The degree of uptake is similar to the previous exam. Cannot exclude residual FDG avid tumor. 2. No signs of solid organ or nodal metastasis within the chest, abdomen or pelvis. 3. Interval resolution of previous FDG avid left axillary lymph nodes. 4. Interval increase in radiotracer uptake associated with anterior mediastinal soft tissue. The configuration of the soft tissue is consistent with the thymus gland. 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 5. Interval development of multiple FDG avid cutaneous soft tissue nodules within the anterior left shoulder, ventral chest wall, and anteromedial left thigh. These are nonspecific and may be  inflammatory or infectious in etiology. Metastatic disease is considered less favored. Correlation with physical exam findings is advised. 6. Enlarged uterus containing degenerating fibroids.      INTERVAL HISTORY:  Tammy Garrison was contacted for a follow up of anal cancer . She was last seen by me on 01/15/2023. Pt state that she doesn't feel well. She reports of still having diarrhea and bleeding when she goes to the restroom.   All other systems were reviewed with the patient and are negative.  MEDICAL HISTORY:  Past Medical History:  Diagnosis Date   Anemia    Bipolar 1 disorder (HCC)    Cancer (HCC)    squamous cell rectal cancer   ETOH abuse    H/O self-harm    H/O suicide attempt    x 5 - last 05/2016 - overdose Ibuprofen   Marijuana abuse    Rheumatoid arthritis (HCC)     SURGICAL HISTORY: Past Surgical History:  Procedure Laterality Date   HEMORRHOID SURGERY N/A 02/08/2022   Procedure: EXTERNAL AND INTERNAL HEMORRHOIDECTOMY;  Surgeon: Violeta Gelinas, MD;  Location: [facility] OR;  Service: General;  Laterality: N/A;   INCISION AND DRAINAGE ABSCESS Right 04/22/2022   Procedure: INCISION AND DRAINAGE axilla;  Surgeon: Sheliah Hatch, De Blanch, MD;  Location: [facility] ORS;  Service: General;  Laterality: Right;   RECTAL EXAM UNDER ANESTHESIA N/A 07/21/2022   Procedure: RECTAL EXAM UNDER ANESTHESIA;  Surgeon: Andria Meuse, MD;  Location: [facility] OR;  Service: General;  Laterality: N/A;  TRANSANAL HEMORRHOIDAL DEARTERIALIZATION N/A 07/21/2022   Procedure: ANAL CANAL BIOPSY;  Surgeon: Andria Meuse, MD;  Location: [facility] OR;  Service: General;  Laterality: N/A;    I have reviewed the social history and family history with the patient and they are unchanged from previous note.  ALLERGIES:  is allergic to coconut flavor.  MEDICATIONS:  Current Outpatient Medications  Medication Sig Dispense Refill   acetaminophen (TYLENOL) 325 MG tablet Take 2 tablets (650 mg total) by  mouth every 6 (six) hours as needed for mild pain.     gabapentin (NEURONTIN) 300 MG capsule Take 1 capsule (300 mg total) by mouth 3 (three) times daily. 90 capsule 2   mirtazapine (REMERON) 15 MG tablet Take 1 tablet (15 mg total) by mouth at bedtime. 30 tablet 2   pantoprazole (PROTONIX) 40 MG tablet Take 1 tablet (40 mg total) by mouth daily. (Patient not taking: Reported on 12/25/2022) 30 tablet 1   polyethylene glycol (MIRALAX / GLYCOLAX) 17 g packet Take 17 g by mouth daily as needed for mild constipation.     predniSONE (DELTASONE) 10 MG tablet Take 3 tablets by mouth daily with breakfast for 14 days, THEN take 2 tablets daily with breakfast for 14 days, THEN take 1 tablet daily with breakfast for 28 days. 98 tablet 0   Tofacitinib Citrate ER (XELJANZ XR) 11 MG TB24 Take 1 tablet (11 mg total) by mouth daily. 90 tablet 0   No current facility-administered medications for this visit.   Facility-Administered Medications Ordered in Other Visits  Medication Dose Route Frequency Provider Last Rate Last Admin   0.9 %  sodium chloride infusion (Manually program via Guardrails IV Fluids)  250 mL Intravenous Once Malachy Mood, MD       heparin lock flush 100 unit/mL  250 Units Intracatheter Once Malachy Mood, MD       sodium chloride flush (NS) 0.9 % injection 10 mL  10 mL Intracatheter Once Malachy Mood, MD       sodium chloride flush (NS) 0.9 % injection 10 mL  10 mL Intracatheter Once Malachy Mood, MD       sodium chloride flush (NS) 0.9 % injection 10 mL  10 mL Intracatheter Once Malachy Mood, MD        PHYSICAL EXAMINATION: ECOG PERFORMANCE STATUS: 1 - Symptomatic but completely ambulatory  There were no vitals filed for this visit. Wt Readings from Last 3 Encounters:  01/27/23 289 lb (131.1 kg)  01/15/23 284 lb 9.6 oz (129.1 kg)  12/25/22 271 lb (122.9 kg)     No vitals taken today, Exam not performed today  LABORATORY DATA:  I have reviewed the data as listed    Latest Ref Rng & Units  01/11/2023    9:39 AM 12/25/2022    9:03 AM 11/30/2022    6:56 AM  CBC  WBC 4.0 - 10.5 K/uL 10.7  7.1  11.7   Hemoglobin 12.0 - 15.0 g/dL 78.2  95.6  9.7   Hematocrit 36.0 - 46.0 % 34.1  36.1  32.2   Platelets 150 - 400 K/uL 422  335  615         Latest Ref Rng & Units 01/11/2023    9:39 AM 12/25/2022    9:03 AM 11/30/2022    6:56 AM  CMP  Glucose 70 - 99 mg/dL 213  086  578   BUN 6 - 20 mg/dL 16  16  11    Creatinine 0.44 - 1.00 mg/dL  0.64  0.61  0.64   Sodium 135 - 145 mmol/L 138  137  136   Potassium 3.5 - 5.1 mmol/L 3.7  4.1  3.7   Chloride 98 - 111 mmol/L 106  102  103   CO2 22 - 32 mmol/L 27  27  24    Calcium 8.9 - 10.3 mg/dL 9.2  9.4  9.0   Total Protein 6.5 - 8.1 g/dL 7.6  7.4    Total Bilirubin 0.3 - 1.2 mg/dL 0.3  0.6    Alkaline Phos 38 - 126 U/L 75     AST 15 - 41 U/L 9  17    ALT 0 - 44 U/L 11  42        RADIOGRAPHIC STUDIES: I have personally reviewed the radiological images as listed and agreed with the findings in the report. No results found.    No orders of the defined types were placed in this encounter.  All questions were answered. The patient knows to call the clinic with any problems, questions or concerns. No barriers to learning was detected. The total time spent in the appointment was 15 minutes.     Malachy Mood, MD 02/10/2023   Carolin Coy am acting as scribe for Malachy Mood, MD.   I have reviewed the above documentation for accuracy and completeness, and I agree with the above.

## 2023-02-10 NOTE — Assessment & Plan Note (Addendum)
cT2N0Mx, with hypermetabolic left axillary nodes, HPV(+) -presented with worsening rectal bleeding, began passing clots and was admitted 02/06/22 for anemia from blood loss. S/p hemorrhoidectomy on 02/08/22 by Dr. 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 -PET scan 02/26/22 showed: intense uptake to anus; no signs of solid organ or FDG-avid nodal metastasis within abdomen or pelvis, hypermetabolic left axillary lymph nodes.  Sequently left axillary lymph node biopsy was benign. -she established care with Dr. Cliffton Asters on 03/03/22. -s/p concurrent chemoRT with mitomycin/5FU 03/09/22 - 04/20/22, tolerated moderately well with significant rectal pain. -Restaging PET scan on July 08, 2022 showed persistent increased radiotracer uptake associated with the anus, suspicious for residual disease.  She also had persistent significant anal pain.  She underwent exam and a biopsy by Dr. Cliffton Asters in OR on July 21, 2022, which was negative for residual disease. -Her rectal pain has resolved, no clinical concern for residual cancer -I reviewed her separating CT scan from January 07, 2023, which showed new enlarged right and left axillary and subpectoral lymph nodes, and numerous mildly enlarged retroperitoneal and right iliac lymph nodes, just stable from last CT scan.  She does have rheumatoid arthritis with diffuse joint pain, her adenopathy are certainly possible reactive, however given her history of anal cancer, I will obtain a PET scan for further evaluation, to see if any need biopsy one of her lymph nodes. -PET 02/05/23 showed persistent increased radiotracer uptake associated with the anus compatible with known anal cancer. Hypermetabolic bilateral axillary, bilateral subpectoral, right supraclavicular, retroperitoneal and right iliac side chain lymph nodes concerning for nodal metastatic disease. -I recommend IR node biopsy,  she agrees  -I encourage her to f/u with Dr. Cliffton Asters and have anal scopy exam under anesthesia

## 2023-02-11 ENCOUNTER — Other Ambulatory Visit (HOSPITAL_COMMUNITY): Payer: Self-pay

## 2023-02-11 ENCOUNTER — Encounter: Payer: Self-pay | Admitting: Hematology

## 2023-02-11 ENCOUNTER — Other Ambulatory Visit: Payer: Self-pay

## 2023-02-11 MED ORDER — PREDNISONE 10 MG PO TABS
10.0000 mg | ORAL_TABLET | Freq: Every day | ORAL | 0 refills | Status: DC
Start: 2023-02-11 — End: 2023-02-24
  Filled 2023-02-11: qty 30, 30d supply, fill #0

## 2023-02-11 NOTE — Telephone Encounter (Signed)
Sent new Rx for 10 mg once daily.

## 2023-02-12 ENCOUNTER — Other Ambulatory Visit (HOSPITAL_COMMUNITY): Payer: Self-pay

## 2023-02-16 ENCOUNTER — Other Ambulatory Visit (HOSPITAL_COMMUNITY): Payer: Self-pay

## 2023-02-24 ENCOUNTER — Other Ambulatory Visit: Payer: Self-pay | Admitting: Nurse Practitioner

## 2023-02-24 ENCOUNTER — Ambulatory Visit: Payer: Medicaid Other | Attending: Internal Medicine | Admitting: Internal Medicine

## 2023-02-24 ENCOUNTER — Other Ambulatory Visit: Payer: Self-pay

## 2023-02-24 ENCOUNTER — Encounter: Payer: Self-pay | Admitting: Internal Medicine

## 2023-02-24 ENCOUNTER — Other Ambulatory Visit (HOSPITAL_COMMUNITY): Payer: Self-pay

## 2023-02-24 VITALS — BP 139/99 | HR 101 | Resp 16 | Ht 72.0 in | Wt 282.0 lb

## 2023-02-24 DIAGNOSIS — Z79899 Other long term (current) drug therapy: Secondary | ICD-10-CM | POA: Diagnosis not present

## 2023-02-24 DIAGNOSIS — C210 Malignant neoplasm of anus, unspecified: Secondary | ICD-10-CM

## 2023-02-24 DIAGNOSIS — M0579 Rheumatoid arthritis with rheumatoid factor of multiple sites without organ or systems involvement: Secondary | ICD-10-CM

## 2023-02-24 DIAGNOSIS — M792 Neuralgia and neuritis, unspecified: Secondary | ICD-10-CM

## 2023-02-24 DIAGNOSIS — M069 Rheumatoid arthritis, unspecified: Secondary | ICD-10-CM

## 2023-02-24 DIAGNOSIS — Z515 Encounter for palliative care: Secondary | ICD-10-CM

## 2023-02-24 LAB — CBC WITH DIFFERENTIAL/PLATELET
Basophils Absolute: 22 cells/uL (ref 0–200)
Basophils Relative: 0.2 %
Eosinophils Absolute: 11 cells/uL — ABNORMAL LOW (ref 15–500)
Lymphs Abs: 1101 cells/uL (ref 850–3900)
MCHC: 31.3 g/dL — ABNORMAL LOW (ref 32.0–36.0)
MCV: 81.8 fL (ref 80.0–100.0)
Monocytes Relative: 4.8 %
Neutro Abs: 9243 cells/uL — ABNORMAL HIGH (ref 1500–7800)
Platelets: 587 10*3/uL — ABNORMAL HIGH (ref 140–400)
RBC: 4.06 10*6/uL (ref 3.80–5.10)
RDW: 17 % — ABNORMAL HIGH (ref 11.0–15.0)
WBC: 10.9 10*3/uL — ABNORMAL HIGH (ref 3.8–10.8)

## 2023-02-24 MED ORDER — GABAPENTIN 300 MG PO CAPS
300.0000 mg | ORAL_CAPSULE | Freq: Three times a day (TID) | ORAL | 0 refills | Status: DC
Start: 2023-02-24 — End: 2023-05-24
  Filled 2023-02-24: qty 90, 30d supply, fill #0

## 2023-02-24 MED ORDER — PREDNISONE 20 MG PO TABS
ORAL_TABLET | ORAL | 0 refills | Status: DC
Start: 2023-02-24 — End: 2023-04-07
  Filled 2023-02-24: qty 55, 34d supply, fill #0
  Filled 2023-03-23: qty 55, 8d supply, fill #1
  Filled 2023-04-01: qty 8, 8d supply, fill #1

## 2023-02-24 NOTE — Progress Notes (Signed)
Office Visit Note  Patient: Tammy Garrison             Date of Birth: May 21, 1991           MRN: [id number]             PCP: [facility] Referring: [facility] Visit Date: 02/24/2023   Subjective:  Follow-up   History of Present Illness: Tammy Garrison is a 32 y.o. 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  She saw partial benefit when on the 30 mg prednisone but has really not been doing well at any point since her last visit.  Still with joint pain and swelling in multiple areas.  Has trouble standing up due to leg pains and cannot use her arms to push off easily due to her pain especially left elbow.  Over the interval she had follow-up scans with oncology including PET scan on July 5 this was concerning for hypermetabolic regional lymph nodes around the associated anal squamous cell cancer.  She was recommended for new scope and biopsy has not yet been established went over with him so far.  Currently symptoms completely prevent her from working and are limiting even for her ADLs requiring assistance to get back and forth to the bathroom and move around the house.  Previous HPI 12/25/22 Tammy Garrison is a 32 y.o. female here for evaluation and management of seropositive rheumatoid arthritis.  Originally diagnosed with onset of symptoms and 2019 and saw Dr. Kathi Ludwig for this.  She was initially treated with methotrexate which was ineffective subsequently treated with Harriette Ohara with a good improvement.  She discontinued treatment in 2021 due to cost being prohibitive also was in disease remission at the time.  She continued to do well off of any maintenance therapy until last year after diagnosis with anal carcinoma then treated with concurrent chemoradiation therapy and redeveloped significant joint inflammation in multiple areas.  Her treatments and symptoms that put her out of work most of the past  year.  The past few months joint inflammation is getting worse has been seen at the hospital multiple times and treated with prolonged steroid tapers twice in February and April.  She see symptom relief when started on the high-dose steroids typically with some starting to return near the end of the taper and then gets a severe flare again within a few days of discontinuing medicine.  Currently she is off any steroids for 4 days and has severe joint pain and swelling in multiple areas. She also has hidradenitis suppurativa with involvement of the axillary, intertriginous, and groin areas.  No current open cysts or drainage.  Does notice some hardened mildly tender area along the left lower breast.  She was never on prolonged antibiotics and does not see dermatology for this condition.  HS symptoms were also quiescent during the few years off treatment before her cancer last year. No history of frequent respiratory or urinary tract infections.  She has had problems with bleeding and blood clots associated with the anal cancer but not prior to this.  No shortness of breath or persistent cough.   DMARD Hx Harriette Ohara - stopped 2021 due to cost and disease remission MTX - nonresponder   Review of Systems  Constitutional:  Positive for fatigue.  HENT:  Negative for mouth sores and mouth dryness.   Eyes:  Negative for dryness.  Respiratory:  Positive for shortness  of breath.   Cardiovascular:  Positive for chest pain and palpitations.  Gastrointestinal:  Positive for blood in stool, constipation and diarrhea.  Endocrine: Positive for increased urination.  Genitourinary:  Positive for involuntary urination.  Musculoskeletal:  Positive for joint pain, gait problem, joint pain, joint swelling, myalgias, muscle weakness, morning stiffness, muscle tenderness and myalgias.  Skin:  Negative for color change, rash, hair loss and sensitivity to sunlight.  Allergic/Immunologic: Positive for susceptible to  infections.  Neurological:  Positive for dizziness and headaches.  Hematological:  Positive for swollen glands.  Psychiatric/Behavioral:  Positive for depressed mood and sleep disturbance. The patient is nervous/anxious.     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   Bipolar 1 disorder (HCC) 02/23/2022   RA (rheumatoid arthritis) (HCC) 02/07/2022   Syncope 02/06/2022   ABLA (acute blood loss anemia) 02/06/2022   BRBPR (bright red blood per rectum) 02/06/2022   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   Depression 05/20/2016   Marijuana dependence (HCC) 05/19/2016    Past Medical History:  Diagnosis Date   Anemia    Bipolar 1 disorder (HCC)    Cancer (HCC)    squamous cell rectal cancer   ETOH abuse    H/O self-harm    H/O suicide attempt    x 5 - last 05/2016 - overdose Ibuprofen   Marijuana abuse    Rheumatoid arthritis (HCC)     Family History  Problem Relation Age of Onset   Lupus Mother    Hypertension Other    Past Surgical History:  Procedure Laterality Date   HEMORRHOID SURGERY N/A 02/08/2022   Procedure: EXTERNAL AND INTERNAL HEMORRHOIDECTOMY;  Surgeon: Violeta Gelinas, MD;  Location: [facility];  Service: General;  Laterality: N/A;   INCISION AND DRAINAGE ABSCESS Right 04/22/2022   Procedure: INCISION AND DRAINAGE axilla;  Surgeon: Sheliah Hatch, De Blanch, MD;  Location: [facility] ORS;   Service: General;  Laterality: Right;   RECTAL EXAM UNDER ANESTHESIA N/A 07/21/2022   Procedure: RECTAL EXAM UNDER ANESTHESIA;  Surgeon: Andria Meuse, MD;  Location: [facility] OR;  Service: General;  Laterality: N/A;   TRANSANAL HEMORRHOIDAL DEARTERIALIZATION N/A 07/21/2022   Procedure: ANAL CANAL BIOPSY;  Surgeon: Andria Meuse, MD;  Location: [facility] OR;  Service: General;  Laterality: N/A;   Social History   Social History Narrative   Not on file    There is no immunization history on file for this patient.   Objective: Vital Signs: BP (!) 139/99 (BP Location: Right Arm, Patient Position: Sitting, Cuff Size: Normal)   Pulse (!) 101   Resp 16   Ht 6' (1.829 m)   Wt 282 lb (127.9 kg)   BMI 38.25 kg/m    Physical Exam Constitutional:      Appearance: She is obese.  Eyes:     Conjunctiva/sclera: Conjunctivae normal.  Cardiovascular:     Rate and Rhythm: Normal rate and regular rhythm.  Pulmonary:  Effort: Pulmonary effort is normal.     Breath sounds: Normal breath sounds.  Musculoskeletal:     Right lower leg: No edema.     Left lower leg: No edema.  Lymphadenopathy:     Cervical: No cervical adenopathy.  Skin:    General: Skin is warm and dry.     Findings: No rash.  Neurological:     Mental Status: She is alert.  Psychiatric:        Mood and Affect: Mood normal.      Musculoskeletal Exam:  Shoulder pain with reaching overhead no palpable swelling Bilateral elbow tenderness, left with palpable swelling and severely limited ROM with guarding Right wrist pain swelling and decreased ROM Fingers grip ROM about 80% normal, 2nd-3rd MCP swelling and tenderness Right knee swelling tenderness and decreased ROM  CDAI Exam: CDAI Score: 24  Patient Global: 80 / 100; Provider Global: 50 / 100 Swollen: 5 ; Tender: 6  Joint Exam 02/24/2023      Right  Left  Elbow   Tender  Swollen Tender  Wrist  Swollen Tender     MCP 2  Swollen Tender     MCP 3  Swollen  Tender     Knee  Swollen Tender        Investigation: No additional findings.  Imaging: NM PET Image Initial (PI) Skull Base To Thigh  Result Date: 02/06/2023 CLINICAL DATA:  Subsequent treatment strategy for anal cancer. EXAM: NUCLEAR MEDICINE PET SKULL BASE TO THIGH TECHNIQUE: 14.4 mCi F-18 FDG was injected intravenously. Full-ring PET imaging was performed from the skull base to thigh after the radiotracer. CT data was obtained and used for attenuation correction and anatomic localization. Fasting blood glucose: 117 mg/dl COMPARISON:  Multiple priors including CT January 12, 2023 CT September 17, 2022 and PET-CT July 08, 2022 FINDINGS: Mediastinal blood pool activity: SUV max 2.31 Liver activity: SUV max NA NECK: No hypermetabolic cervical adenopathy. Incidental CT findings: None. CHEST: Hypermetabolic right-greater-than-left axillary and subpectoral lymph nodes with hypermetabolic right supraclavicular lymph node. For reference: -right axillary lymph node measures 17 mm in short axis on image 46/4 with a max SUV of 8.3. -hypermetabolic right supraclavicular lymph node measures 9 mm in short axis on image 43/4 with a max SUV of 6.0 Similar low-level FDG avidity in the crescentic soft tissue in the anterior mediastinum max SUV of 5.0 compatible with thymic tissue. No definite hypermetabolic mediastinal or hilar lymph nodes identified. No hypermetabolic pulmonary nodules or masses. Incidental CT findings: Motion degraded examination of the lungs reveals no suspicious pulmonary nodules or masses. ABDOMEN/PELVIS: Persistent increased radiotracer uptake associated with the anus max SUV of 7.9 previously 10.3. Hypermetabolic mildly enlarged/prominent retroperitoneal and right iliac side chain lymph nodes. For reference: -Right common iliac lymph node measures 10 mm in short axis on image 131/4 with a max SUV of 4.4. -Right internal iliac lymph node measures 7 mm in short axis on image 147/4 with a max SUV of  7.1 -Low aortocaval lymph node measures 7 mm in short axis on image 125/4 with a max SUV of 5.5. No hypermetabolic activity within the liver, pancreas, adrenal glands, or spleen. Incidental CT findings: Leiomyomatous uterus. SKELETON: No focal hypermetabolic activity to suggest skeletal metastasis. Incidental CT findings: Multilevel degenerative changes spine. Small osseous foci in the bilateral iliac bones near the SI joints on image 158/4 are not abnormally FDG avid and stable from prior examination compatible with a benign finding. IMPRESSION: 1. Persistent increased radiotracer uptake  associated with the anus compatible with known anal cancer. 2. Hypermetabolic bilateral axillary, bilateral subpectoral, right supraclavicular, retroperitoneal and right iliac side chain lymph nodes concerning for nodal metastatic disease. 3. No scintigraphic evidence of hypermetabolic solid organ or osseous metastasis. Electronically Signed   By: Maudry Mayhew M.D.   On: 02/06/2023 10:30   CT Head Wo Contrast  Result Date: 01/27/2023 CLINICAL DATA:  Head trauma, fall EXAM: CT HEAD WITHOUT CONTRAST TECHNIQUE: Contiguous axial images were obtained from the base of the skull through the vertex without intravenous contrast. RADIATION DOSE REDUCTION: This exam was performed according to the departmental dose-optimization program which includes automated exposure control, adjustment of the mA and/or kV according to patient size and/or use of iterative reconstruction technique. COMPARISON:  05/18/2016 FINDINGS: Brain: No evidence of acute infarction, hemorrhage, mass, mass effect, or midline shift. No hydrocephalus or extra-axial fluid collection. Vascular: No hyperdense vessel. Skull: Negative for fracture or focal lesion. Sinuses/Orbits: Minimal air-fluid level in the right maxillary sinus. Otherwise clear paranasal sinuses. No acute finding in the orbits. Other: The mastoid air cells are well aerated. IMPRESSION: No acute  intracranial process. Electronically Signed   By: Wiliam Ke M.D.   On: 01/27/2023 13:55   DG Ankle Complete Right  Result Date: 01/27/2023 CLINICAL DATA:  Fall, right ankle pain EXAM: RIGHT ANKLE - COMPLETE 3+ VIEW COMPARISON:  None Available. FINDINGS: There is no evidence of fracture, dislocation, or joint effusion. There is no evidence of arthropathy or other focal bone abnormality. Marked soft tissue swelling about the right ankle. IMPRESSION: No acute fracture or dislocation of the right ankle. Marked soft tissue swelling. Electronically Signed   By: Larose Hires D.O.   On: 01/27/2023 13:26   DG Foot Complete Left  Result Date: 01/27/2023 CLINICAL DATA:  Fall.  Patient tripped. EXAM: LEFT ANKLE COMPLETE - 3+ VIEW; LEFT FOOT - COMPLETE 3+ VIEW COMPARISON:  None Available. FINDINGS: Left ankle: There is no evidence of fracture, dislocation, or joint effusion. There is no evidence of arthropathy or other focal bone abnormality. Soft tissues are unremarkable. Left foot: There is no evidence of fracture, dislocation or joint effusion. Soft tissue swelling about the dorsum of the foot. IMPRESSION: 1. No evidence of fracture or dislocation. 2. Soft tissue swelling about the dorsum of the foot. Electronically Signed   By: Larose Hires D.O.   On: 01/27/2023 13:25   DG Ankle Complete Left  Result Date: 01/27/2023 CLINICAL DATA:  Fall.  Patient tripped. EXAM: LEFT ANKLE COMPLETE - 3+ VIEW; LEFT FOOT - COMPLETE 3+ VIEW COMPARISON:  None Available. FINDINGS: Left ankle: There is no evidence of fracture, dislocation, or joint effusion. There is no evidence of arthropathy or other focal bone abnormality. Soft tissues are unremarkable. Left foot: There is no evidence of fracture, dislocation or joint effusion. Soft tissue swelling about the dorsum of the foot. IMPRESSION: 1. No evidence of fracture or dislocation. 2. Soft tissue swelling about the dorsum of the foot. Electronically Signed   By: Larose Hires  D.O.   On: 01/27/2023 13:25    Recent Labs: Lab Results  Component Value Date   WBC 10.7 (H) 01/11/2023   HGB 10.7 (L) 01/11/2023   PLT 422 (H) 01/11/2023   NA 138 01/11/2023   K 3.7 01/11/2023   CL 106 01/11/2023   CO2 27 01/11/2023   GLUCOSE 122 (H) 01/11/2023   BUN 16 01/11/2023   CREATININE 0.64 01/11/2023   BILITOT 0.3 01/11/2023   ALKPHOS 75  01/11/2023   AST 9 (L) 01/11/2023   ALT 11 01/11/2023   PROT 7.6 01/11/2023   ALBUMIN 3.5 01/11/2023   CALCIUM 9.2 01/11/2023   GFRAA >60 04/12/2018   QFTBGOLDPLUS NEGATIVE 12/25/2022    Speciality Comments: No specialty comments available.  Procedures:  No procedures performed Allergies: Coconut flavor   Assessment / Plan:     Visit Diagnoses: Rheumatoid arthritis involving multiple sites, unspecified whether rheumatoid factor present (HCC) - Plan: Sedimentation rate, predniSONE (DELTASONE) 20 MG tablet  Remains in high disease activity despite resuming Xeljanz and on prednisone 10 mg daily.  May be too early to see maximum benefit although in the past Harriette Ohara was effective within weeks of starting.  Currently is completely disabled by disease activity.  Will recheck sedimentation rate if numerically improving would expect to see more clinical benefit over time.  Will also prescribe high-dose prednisone taper for now starting at 60 mg daily she is reported effective response on high-dose in the past but taper back down over 3 weeks.  Anal squamous cell carcinoma (HCC)  Seeing Dr. Mosetta Putt for this unfortunately looks like there is some increase her activity from her most recent scan compared to previous.  There is association for squamous cell cancer with long-term immunosuppressive treatment but also would be at higher risk from severe immobility uncontrolled inflammation or remaining on high-dose prednisone.  We need to check with her oncology treatment plan if needing to consider switching DMARD.  High risk medication use - Plan:  CBC with Differential/Platelet, COMPLETE METABOLIC PANEL WITH GFR, Lipid panel  Checking CBC and CMP and lipid panel for medication monitoring after restarting the Xeljanz.  No obvious side effects from the medication no new infections.    Orders: Orders Placed This Encounter  Procedures   Sedimentation rate   CBC with Differential/Platelet   COMPLETE METABOLIC PANEL WITH GFR   Lipid panel   Meds ordered this encounter  Medications   predniSONE (DELTASONE) 20 MG tablet    Sig: Take 3 tablets (60 mg total) by mouth daily with breakfast for 7 days, THEN 2 tablets (40 mg total) daily with breakfast for 7 days, THEN 1 tablet (20 mg total) daily with breakfast for 28 days.    Dispense:  63 tablet    Refill:  0     Follow-Up Instructions: Return in about 6 weeks (around 04/07/2023) for RA on TOFA/GC f/u 6wks.   Fuller Plan, MD  Note - This record has been created using [employer].  Chart creation errors have been sought, but may not always  have been located. Such creation errors do not reflect on  the standard of medical care.

## 2023-02-25 ENCOUNTER — Encounter: Payer: Self-pay | Admitting: Hematology

## 2023-02-25 ENCOUNTER — Other Ambulatory Visit (HOSPITAL_COMMUNITY): Payer: Self-pay

## 2023-02-25 LAB — COMPLETE METABOLIC PANEL WITH GFR
AG Ratio: 0.9 (calc) — ABNORMAL LOW (ref 1.0–2.5)
ALT: 14 U/L (ref 6–29)
AST: 13 U/L (ref 10–30)
Albumin: 3.9 g/dL (ref 3.6–5.1)
Alkaline phosphatase (APISO): 95 U/L (ref 31–125)
BUN: 12 mg/dL (ref 7–25)
CO2: 25 mmol/L (ref 20–32)
Calcium: 9.9 mg/dL (ref 8.6–10.2)
Chloride: 102 mmol/L (ref 98–110)
Creat: 0.6 mg/dL (ref 0.50–0.97)
Globulin: 4.2 g/dL (calc) — ABNORMAL HIGH (ref 1.9–3.7)
Glucose, Bld: 109 mg/dL — ABNORMAL HIGH (ref 65–99)
Potassium: 4.5 mmol/L (ref 3.5–5.3)
Sodium: 137 mmol/L (ref 135–146)
Total Bilirubin: 0.2 mg/dL (ref 0.2–1.2)
Total Protein: 8.1 g/dL (ref 6.1–8.1)
eGFR: 124 mL/min/{1.73_m2} (ref >=60)

## 2023-02-25 LAB — CBC WITH DIFFERENTIAL/PLATELET
Absolute Monocytes: 523 cells/uL (ref 200–950)
Eosinophils Relative: 0.1 %
HCT: 33.2 % — ABNORMAL LOW (ref 35.0–45.0)
Hemoglobin: 10.4 g/dL — ABNORMAL LOW (ref 11.7–15.5)
MCH: 25.6 pg — ABNORMAL LOW (ref 27.0–33.0)
MPV: 9 fL (ref 7.5–12.5)
Neutrophils Relative %: 84.8 %
Total Lymphocyte: 10.1 %

## 2023-02-25 LAB — LIPID PANEL
Cholesterol: 149 mg/dL (ref <200)
HDL: 54 mg/dL (ref >=50)
LDL Cholesterol (Calc): 81 mg/dL (calc)
Total CHOL/HDL Ratio: 2.8 (calc) (ref <5.0)
Triglycerides: 65 mg/dL (ref <150)

## 2023-02-25 LAB — SEDIMENTATION RATE: Sed Rate: 94 mm/h — ABNORMAL HIGH (ref 0–20)

## 2023-03-05 ENCOUNTER — Other Ambulatory Visit: Payer: Self-pay

## 2023-03-05 ENCOUNTER — Encounter: Payer: Self-pay | Admitting: Hematology

## 2023-03-09 ENCOUNTER — Telehealth: Payer: Self-pay

## 2023-03-09 ENCOUNTER — Other Ambulatory Visit: Payer: Self-pay

## 2023-03-09 DIAGNOSIS — C210 Malignant neoplasm of anus, unspecified: Secondary | ICD-10-CM

## 2023-03-09 NOTE — Telephone Encounter (Signed)
Spoke with Tammy Garrison regarding Rt Axillary Node biopsy.  Stated Dr. Mosetta Putt would like for the pt to get the bx.  Pt verbalize agreement w/doing the biopsy.  Stated someone from the IR Scheduling Team will be contacting the pt to get the biopsy scheduled once the pt's insurance authorize approval.  Pt verbalized understanding and had no further questions or concerns at this time.

## 2023-03-10 ENCOUNTER — Encounter: Payer: Self-pay | Admitting: General Practice

## 2023-03-10 NOTE — Progress Notes (Signed)
Oley Balm, MD  Caroleen Hamman, NT PROCEDURE / BIOPSY REVIEW Date: 03/10/23  Requested Biopsy site: R axillary LAN Reason for request: anal ca r/o met Imaging review: Best seen on PET 02/05/23  Decision: Approved Imaging modality to perform: Ultrasound Schedule with: Patient preference (Local vs Mod Sed) Schedule for: Any VIR  Additional comments:   Please contact me with questions, concerns, or if issue pertaining to this request arise.  Dayne Oley Balm, MD [facility]       Previous Messages    ----- Message ----- From: Caroleen Hamman, NT Sent: 03/10/2023  11:06 AM EDT To: Ir Procedure Requests Subject: CT Biopsy                                      Procedure: CT Biopsy  Reason: right axillary lymph node measures 17 mm Dx: Anal squamous cell carcinoma  History: NM and CT in chart  Provider: Malachy Mood, MD  Contact: [phone]

## 2023-03-11 ENCOUNTER — Telehealth: Payer: Self-pay | Admitting: Hematology

## 2023-03-14 ENCOUNTER — Other Ambulatory Visit: Payer: Self-pay

## 2023-03-14 ENCOUNTER — Emergency Department (HOSPITAL_COMMUNITY): Payer: Medicaid Other

## 2023-03-14 ENCOUNTER — Emergency Department (HOSPITAL_COMMUNITY)
Admission: EM | Admit: 2023-03-14 | Discharge: 2023-03-14 | Disposition: A | Discharge status: Home / Self Care | Payer: Medicaid Other | Attending: Emergency Medicine | Admitting: Emergency Medicine

## 2023-03-14 ENCOUNTER — Encounter (HOSPITAL_COMMUNITY): Payer: Self-pay

## 2023-03-14 DIAGNOSIS — B009 Herpesviral infection, unspecified: Secondary | ICD-10-CM | POA: Insufficient documentation

## 2023-03-14 DIAGNOSIS — R000 Tachycardia, unspecified: Secondary | ICD-10-CM | POA: Diagnosis not present

## 2023-03-14 DIAGNOSIS — N760 Acute vaginitis: Secondary | ICD-10-CM | POA: Insufficient documentation

## 2023-03-14 DIAGNOSIS — R102 Pelvic and perineal pain: Secondary | ICD-10-CM | POA: Diagnosis not present

## 2023-03-14 DIAGNOSIS — R21 Rash and other nonspecific skin eruption: Secondary | ICD-10-CM | POA: Diagnosis present

## 2023-03-14 DIAGNOSIS — B9689 Other specified bacterial agents as the cause of diseases classified elsewhere: Secondary | ICD-10-CM

## 2023-03-14 DIAGNOSIS — L03818 Cellulitis of other sites: Secondary | ICD-10-CM

## 2023-03-14 DIAGNOSIS — A6009 Herpesviral infection of other urogenital tract: Secondary | ICD-10-CM

## 2023-03-14 LAB — CBC WITH DIFFERENTIAL/PLATELET
Abs Immature Granulocytes: 0.14 10*3/uL — ABNORMAL HIGH (ref 0.00–0.07)
Basophils Absolute: 0 10*3/uL (ref 0.0–0.1)
Basophils Relative: 0 %
Eosinophils Absolute: 0.1 10*3/uL (ref 0.0–0.5)
Eosinophils Relative: 1 %
HCT: 37 % (ref 36.0–46.0)
Hemoglobin: 11.1 g/dL — ABNORMAL LOW (ref 12.0–15.0)
Immature Granulocytes: 1 %
Lymphocytes Relative: 16 %
Lymphs Abs: 1.7 10*3/uL (ref 0.7–4.0)
MCH: 25.7 pg — ABNORMAL LOW (ref 26.0–34.0)
MCHC: 30 g/dL (ref 30.0–36.0)
MCV: 85.6 fL (ref 80.0–100.0)
Monocytes Absolute: 0.9 10*3/uL (ref 0.1–1.0)
Monocytes Relative: 8 %
Neutro Abs: 8 10*3/uL — ABNORMAL HIGH (ref 1.7–7.7)
Neutrophils Relative %: 74 %
Platelets: 313 10*3/uL (ref 150–400)
RBC: 4.32 MIL/uL (ref 3.87–5.11)
RDW: 19.6 % — ABNORMAL HIGH (ref 11.5–15.5)
WBC: 10.8 10*3/uL — ABNORMAL HIGH (ref 4.0–10.5)
nRBC: 0 % (ref 0.0–0.2)

## 2023-03-14 LAB — COMPREHENSIVE METABOLIC PANEL
ALT: 34 U/L (ref 0–44)
AST: 15 U/L (ref 15–41)
Albumin: 3.5 g/dL (ref 3.5–5.0)
Alkaline Phosphatase: 76 U/L (ref 38–126)
Anion gap: 11 (ref 5–15)
BUN: 12 mg/dL (ref 6–20)
CO2: 24 mmol/L (ref 22–32)
Calcium: 9.2 mg/dL (ref 8.9–10.3)
Chloride: 101 mmol/L (ref 98–111)
Creatinine, Ser: 0.65 mg/dL (ref 0.44–1.00)
GFR, Estimated: >60 mL/min (ref >60)
Glucose, Bld: 95 mg/dL (ref 70–99)
Potassium: 3.6 mmol/L (ref 3.5–5.1)
Sodium: 136 mmol/L (ref 135–145)
Total Bilirubin: 0.6 mg/dL (ref 0.3–1.2)
Total Protein: 7.9 g/dL (ref 6.5–8.1)

## 2023-03-14 LAB — URINALYSIS, ROUTINE W REFLEX MICROSCOPIC
Bacteria, UA: NONE SEEN
Bilirubin Urine: NEGATIVE
Glucose, UA: NEGATIVE mg/dL
Ketones, ur: NEGATIVE mg/dL
Nitrite: NEGATIVE
Protein, ur: NEGATIVE mg/dL
Specific Gravity, Urine: 1.014 (ref 1.005–1.030)
pH: 6 (ref 5.0–8.0)

## 2023-03-14 LAB — WET PREP, GENITAL
Sperm: NONE SEEN
Trich, Wet Prep: NONE SEEN
WBC, Wet Prep HPF POC: >=10 — AB (ref <10)
Yeast Wet Prep HPF POC: NONE SEEN

## 2023-03-14 LAB — PREGNANCY, URINE: Preg Test, Ur: NEGATIVE

## 2023-03-14 LAB — HIV ANTIBODY (ROUTINE TESTING W REFLEX): HIV Screen 4th Generation wRfx: NONREACTIVE

## 2023-03-14 MED ORDER — CEPHALEXIN 500 MG PO CAPS
500.0000 mg | ORAL_CAPSULE | Freq: Four times a day (QID) | ORAL | 0 refills | Status: DC
  Filled 2023-03-14 – 2023-03-15 (×2): qty 28, 7d supply, fill #0

## 2023-03-14 MED ORDER — LIDOCAINE HCL 2 % EX GEL
1.0000 | CUTANEOUS | 0 refills | Status: DC | PRN
  Filled 2023-03-14: qty 30, 1d supply, fill #0

## 2023-03-14 MED ORDER — KETOROLAC TROMETHAMINE 30 MG/ML IJ SOLN
30.0000 mg | Freq: Once | INTRAMUSCULAR | Status: AC
  Administered 2023-03-14: 30 mg via INTRAMUSCULAR
  Filled 2023-03-14: qty 1

## 2023-03-14 MED ORDER — MORPHINE SULFATE (PF) 2 MG/ML IV SOLN
2.0000 mg | Freq: Once | INTRAVENOUS | Status: AC
  Administered 2023-03-14: 2 mg via INTRAVENOUS
  Filled 2023-03-14: qty 1

## 2023-03-14 MED ORDER — DOXYCYCLINE HYCLATE 100 MG PO CAPS
100.0000 mg | ORAL_CAPSULE | Freq: Two times a day (BID) | ORAL | 0 refills | Status: DC
  Filled 2023-03-14 – 2023-03-15 (×2): qty 14, 7d supply, fill #0

## 2023-03-14 MED ORDER — SODIUM CHLORIDE 0.9 % IV SOLN
1.0000 g | Freq: Once | INTRAVENOUS | Status: AC
  Administered 2023-03-14: 1 g via INTRAVENOUS
  Filled 2023-03-14: qty 10

## 2023-03-14 MED ORDER — VALACYCLOVIR HCL 500 MG PO TABS
1000.0000 mg | ORAL_TABLET | Freq: Once | ORAL | Status: AC
  Administered 2023-03-14: 1000 mg via ORAL
  Filled 2023-03-14: qty 2

## 2023-03-14 MED ORDER — VALACYCLOVIR HCL 1 G PO TABS
1000.0000 mg | ORAL_TABLET | Freq: Two times a day (BID) | ORAL | 0 refills | Status: AC
  Filled 2023-03-14 – 2023-03-15 (×2): qty 20, 10d supply, fill #0

## 2023-03-14 MED ORDER — IOHEXOL 300 MG/ML  SOLN
100.0000 mL | Freq: Once | INTRAMUSCULAR | Status: AC | PRN
  Administered 2023-03-14: 100 mL via INTRAVENOUS

## 2023-03-14 MED ORDER — METRONIDAZOLE 500 MG PO TABS
500.0000 mg | ORAL_TABLET | Freq: Two times a day (BID) | ORAL | 0 refills | Status: DC
  Filled 2023-03-14 – 2023-03-15 (×2): qty 14, 7d supply, fill #0

## 2023-03-14 MED ORDER — HYDROMORPHONE HCL 1 MG/ML IJ SOLN
1.0000 mg | Freq: Once | INTRAMUSCULAR | Status: AC
  Administered 2023-03-14: 1 mg via INTRAVENOUS
  Filled 2023-03-14: qty 1

## 2023-03-14 MED ORDER — OXYCODONE-ACETAMINOPHEN 5-325 MG PO TABS
1.0000 | ORAL_TABLET | Freq: Four times a day (QID) | ORAL | 0 refills | Status: DC | PRN
  Filled 2023-03-14 – 2023-03-15 (×2): qty 8, 2d supply, fill #0

## 2023-03-14 NOTE — ED Triage Notes (Signed)
Pt arrived via POV. Pt has blistered rash on pelvic area that is painful.

## 2023-03-14 NOTE — ED Provider Notes (Signed)
Blistering rash on R labia w/edema. Getting imaging. Pending CT abd/pelvis. Plan is to treat with valacyclovir. Follow-up with GYN and oncology.   Physical Exam  BP (!) 162/110 Comment: nurse notified  Pulse 84   Temp 97.9 F (36.6 C) (Oral)   Resp 15   Ht 6' (1.829 m)   Wt 127.9 kg   SpO2 98%   BMI 38.25 kg/m   Physical Exam Vitals and nursing note reviewed.  Constitutional:      General: She is not in acute distress.    Appearance: She is well-developed.  HENT:     Head: Normocephalic and atraumatic.  Eyes:     Conjunctiva/sclera: Conjunctivae normal.  Cardiovascular:     Rate and Rhythm: Normal rate and regular rhythm.     Heart sounds: No murmur heard. Pulmonary:     Effort: Pulmonary effort is normal. No respiratory distress.     Breath sounds: Normal breath sounds.  Abdominal:     Palpations: Abdomen is soft.     Tenderness: There is no abdominal tenderness.  Musculoskeletal:        General: No swelling.     Cervical back: Neck supple.  Skin:    General: Skin is warm and dry.     Capillary Refill: Capillary refill takes less than 2 seconds.     Comments: Multiple vesicles, on the of the left perineum, labia, with mild erythema.  Left labia majora, is erythematous, edematous, with no area of fluctuance.  A exam was performed, there is no evidence of any kind of fluctuance inside the vagina, chaperone was present.  No evidence of any kind of Bartholin gland cyst.  Neurological:     Mental Status: She is alert.  Psychiatric:        Mood and Affect: Mood normal.     Procedures  Procedures  ED Course / MDM    Medical Decision Making Patient is a 32 year old female, on a biologic agent, for her RA, here for rash to the pelvic area.  She has diffuse vesicles, there are erythematous, and associated left labia majora swelling.  CT shows cyst.  I did an exam on her, she has no evidence of anything Bartholin gland cyst, or an abscess that needs to be drained.  We will  put her on Keflex as well as doxycycline, and valacyclovir.  We discussed importance of follow-up with gynecology and primary care doctor, and return precautions.  Lidocaine jelly sent for therapeutic comfort, as well as discussed using ice packs in the area and keeping the area clean and dry, to prevent further infection.  Will also treat for BV.  I sent her a short dose of pain medications, to help with this as well.  Amount and/or Complexity of Data Reviewed Labs: ordered. Radiology: ordered.  Risk Prescription drug management.         Pete Pelt, [place] 03/14/23 [id number]    Charlynne Pander, MD 03/14/23 [id number]

## 2023-03-14 NOTE — ED Provider Notes (Signed)
[facility] AT [facility] Provider Note   CSN: [id number] Arrival date & time: 03/14/23  4098     History  Chief Complaint  Patient Tammy with   Rash    Tammy Garrison is a 32 y.o. female with current anal squamous cell Garrison, Tammy Garrison getting worse over the groin and she has been having significant pain.  She denies any abnormal vaginal discharge, concern for STDs.  Denies any fevers or chills   Rash      Home Medications Prior to Admission medications   Medication Sig Start Date End Date Taking? Authorizing Provider  acetaminophen (TYLENOL) 325 MG tablet Take 2 tablets (650 mg total) by mouth every 6 (six) hours as needed for mild pain. 12/01/22   Standley Brooking, MD  gabapentin (NEURONTIN) 300 MG capsule Take 1 capsule (300 mg total) by mouth 3 (three) times daily. 02/24/23   Pickenpack-Cousar, Arty Baumgartner, NP  mirtazapine (REMERON) 15 MG tablet Take 1 tablet (15 mg total) by mouth at bedtime. 12/11/22   Pickenpack-Cousar, Arty Baumgartner, NP  pantoprazole (PROTONIX) 40 MG tablet Take 1 tablet (40 mg total) by mouth daily. Patient not taking: Reported on 12/25/2022 12/01/22 01/30/23  Standley Brooking, MD  polyethylene glycol (MIRALAX / GLYCOLAX) 17 g packet Take 17 g by mouth daily as needed for mild constipation. 12/01/22   Standley Brooking, MD  predniSONE (DELTASONE) 20 MG tablet Take 3 tablets (60 mg total) by mouth daily with breakfast for 7 days, THEN 2 tablets (40 mg total) daily with breakfast for 7 days, THEN 1 tablet (20 mg total) daily with breakfast for 28 days. 02/24/23 04/07/23  Fuller Plan, MD  Tofacitinib Citrate ER (XELJANZ XR) 11 MG TB24 Take 1 tablet (11 mg total) by mouth daily. 01/08/23   Rice, Tammy Orleans, MD      Allergies    Coconut flavor    Review of Systems   Review of Systems  Skin:   Positive for rash.    Physical Exam Updated Vital Signs BP (!) 162/110 Comment: nurse notified  Pulse 84   Temp 97.9 F (36.6 C) (Oral)   Resp 15   Ht 6' (1.829 m)   Wt 127.9 kg   SpO2 98%   BMI 38.25 kg/m  Physical Exam Vitals and nursing note reviewed. Exam conducted with a chaperone present.  Constitutional:      General: She is not in acute distress.    Appearance: She is well-developed.     Comments: Uncomfortable appearing, shaking right leg in pain.  HENT:     Head: Normocephalic and atraumatic.  Cardiovascular:     Rate and Rhythm: Regular rhythm. Tachycardia present.     Heart sounds: No murmur heard. Pulmonary:     Effort: Pulmonary effort is normal. No respiratory distress.     Breath sounds: Normal breath sounds.  Abdominal:     General: Abdomen is flat.     Palpations: Abdomen is soft.     Tenderness: There is no abdominal tenderness.  Genitourinary:    Comments: Mons pubis and right vulva and right thigh crease with grouped vesicles containing clear fluid  Right labia with significant edema and what appears to be a possible abscess towards the bottom of the labia, patient significantly tender to palpation and unable to determine if this is fluctuant  White discharge seen on speculum exam, in significant pain with exam  Musculoskeletal:        General: No swelling.     Cervical back: Neck supple.  Skin:    General: Skin is warm and dry.     Capillary Refill: Capillary refill takes less than 2 seconds.  Neurological:     Mental Status: She is alert.  Psychiatric:        Mood and Affect: Mood normal.     ED Results / Procedures / Treatments   Labs (all labs ordered are listed, but only abnormal results are displayed) Labs Reviewed  WET PREP, GENITAL - Abnormal; Notable for the following components:      Result Value   Clue Cells Wet Prep HPF POC PRESENT (*)    WBC, Wet Prep HPF POC >=10 (*)    All other components within normal limits  CBC WITH  DIFFERENTIAL/PLATELET - Abnormal; Notable for the following components:   WBC 10.8 (*)    Hemoglobin 11.1 (*)    MCH 25.7 (*)    RDW 19.6 (*)    Neutro Abs 8.0 (*)    Abs Immature Granulocytes 0.14 (*)    All other components within normal limits  URINALYSIS, ROUTINE W REFLEX MICROSCOPIC - Abnormal; Notable for the following components:   APPearance HAZY (*)    Hgb urine dipstick SMALL (*)    Leukocytes,Ua TRACE (*)    All other components within normal limits  HSV CULTURE AND TYPING  COMPREHENSIVE METABOLIC PANEL  PREGNANCY, URINE  HIV ANTIBODY (ROUTINE TESTING W REFLEX)  GC/CHLAMYDIA PROBE AMP ([place]) NOT AT [facility]    EKG None  Radiology No results found.  Procedures Procedures    Medications Ordered in ED Medications  ketorolac (TORADOL) 30 MG/ML injection 30 mg (30 mg Intramuscular Given 03/14/23 0938)  HYDROmorphone (DILAUDID) injection 1 mg (1 mg Intravenous Given 03/14/23 1342)  iohexol (OMNIPAQUE) 300 MG/ML solution 100 mL (100 mLs Intravenous Contrast Given 03/14/23 1459)    ED Course/ Medical Decision Making/ A&P                                 Medical Decision Making Amount and/or Complexity of Data Reviewed Labs: ordered. Radiology: ordered.  Risk Prescription drug management.   32 y.o. female with pertinent past medical history of anal squamous cell Garrison Tammy to the ED for concern of rash and pain over her groin  Differential diagnosis includes but is not limited to contact dermatitis, herpes simplex, gonorrhea, chlamydia, abscess, cellulitis  ED Course:  Patient with active anal squamous cell Garrison who Tammy with concern for rash in her groin.  This has been ongoing for past couple of days and is very painful for her.  Patient groin examined in triage with chaperone present, herpetic rash over the mons pubis and right labia appreciated.  Right labia is also significantly edematous, seems to have possible area of abscess on the  lower portion of the labia.  However patient is extremely tender to palpation and I cannot appreciate if this is fluctuant.  Given history of cancer and that she is tachycardic upon presentation, will proceed with CBC, CMP, HSV culture, CT abdomen pelvis for further evaluation to rule out possibility for infection or abscess.  Considered blood cultures but will wait at this time, does not meet SIRS criteria currently.  Will reevaluate once we obtain rectal temperature and CBC.  Upon re-evaluation, patient rectal temperature 97.9, she still has an elevated blood pressure 162/110.  CBC with leukocytosis of 10.8.  She is endorsing significant pain, and had significant pain to pelvic exam performed.  Speculum exam performed which showed moderate amount of white discharge in the vaginal canal and significant edema of the right labia and herpetic vesicles.  She was found to have clue cells on wet prep.  ordered 1 mg Dilaudid for pain and patient given ice pack to help with pain.   Impression: Herpes simplex Bacterial vaginosis   Disposition:  Care of this patient signed out to oncoming ED provider Barnie Alderman, PA-C who will follow up on CT A/P. Disposition and treatment plan pending imaging results and clinical judgment of oncoming ED team.   Lab Tests: I Ordered, and personally interpreted labs.  The pertinent results include:   CBC with Leukocytosis of 10.8 Clue cells seen on wet prep CMP within normal limits Urinalysis with some leukocytes Wet prep with clue cells  Imaging Studies ordered: I ordered imaging studies including CT abdomen pelvis, pending results  External records from outside source obtained and reviewed including recent oncology note from 7/10 for anal squamous cell Garrison   Co morbidities that complicate the patient evaluation  Anal squamous cell Garrison              Final Clinical Impression(s) / ED Diagnoses Final diagnoses:  Herpes genitalis in women   Bacterial vaginosis    Rx / DC Orders ED Discharge Orders     None         Arabella Merles, PA-C 03/14/23 1515    Margarita Grizzle, MD 03/14/23 1527

## 2023-03-14 NOTE — Discharge Instructions (Addendum)
Please use ice on the area, make sure you change her underwear frequently, DQ8 keep it clean and dry.  You can use lidocaine jelly, sparingly, the area to help numb it.  Do not use more than 30 mL, and a 12-hour..  I have also prescribed you some Percocet for your pain control.  You can take ibuprofen as well.  Return to the ER if you have any fevers, chills, worsening pain.  Follow-up with your gynecologist, and your primary care doctor.  You have an infection of the area, and I prescribed you 2 different antibiotics, take them to completion.

## 2023-03-15 ENCOUNTER — Other Ambulatory Visit (HOSPITAL_COMMUNITY): Payer: Self-pay

## 2023-03-15 ENCOUNTER — Other Ambulatory Visit: Payer: Self-pay

## 2023-03-18 ENCOUNTER — Other Ambulatory Visit: Payer: Self-pay | Admitting: Radiology

## 2023-03-18 DIAGNOSIS — C210 Malignant neoplasm of anus, unspecified: Secondary | ICD-10-CM

## 2023-03-19 NOTE — Progress Notes (Signed)
Referring Physician(s): Feng,Yan  Supervising Physician: Roanna Banning  Patient Status:  [facility] OP  Chief Complaint:  " I am having a biopsy"  Subjective: Patient known to IR team from PICC placement in 2023.  She is a 32 year old female with past medical history of anemia, bipolar disorder, alcohol abuse, prior suicide attempt, marijuana abuse, rheumatoid arthritis and history of squamous cell carcinoma of the anus diagnosed in 2023.  PET scan on 02/26/2023 revealed:  1. Persistent increased radiotracer uptake associated with the anus compatible with known anal cancer. 2. Hypermetabolic bilateral axillary, bilateral subpectoral, right supraclavicular, retroperitoneal and right iliac side chain lymph nodes concerning for nodal metastatic disease. 3. No scintigraphic evidence of hypermetabolic solid organ or osseous metastasis  She is scheduled today for image guided right axillary lymph node biopsy for further evaluation.    Past Medical History:  Diagnosis Date   Anemia    Bipolar 1 disorder (HCC)    Cancer (HCC)    squamous cell rectal cancer   ETOH abuse    H/O self-harm    H/O suicide attempt    x 5 - last 05/2016 - overdose Ibuprofen   Marijuana abuse    Rheumatoid arthritis (HCC)    Past Surgical History:  Procedure Laterality Date   HEMORRHOID SURGERY N/A 02/08/2022   Procedure: EXTERNAL AND INTERNAL HEMORRHOIDECTOMY;  Surgeon: Violeta Gelinas, MD;  Location: [facility] OR;  Service: General;  Laterality: N/A;   INCISION AND DRAINAGE ABSCESS Right 04/22/2022   Procedure: INCISION AND DRAINAGE axilla;  Surgeon: Sheliah Hatch, De Blanch, MD;  Location: [facility] ORS;  Service: General;  Laterality: Right;   RECTAL EXAM UNDER ANESTHESIA N/A 07/21/2022   Procedure: RECTAL EXAM UNDER ANESTHESIA;  Surgeon: Andria Meuse, MD;  Location: [facility] OR;  Service: General;  Laterality: N/A;   TRANSANAL HEMORRHOIDAL DEARTERIALIZATION N/A 07/21/2022   Procedure: ANAL CANAL BIOPSY;  Surgeon:  Andria Meuse, MD;  Location: [facility] OR;  Service: General;  Laterality: N/A;      Allergies: Coconut flavor  Medications: Prior to Admission medications   Medication Sig Start Date End Date Taking? Authorizing Provider  acetaminophen (TYLENOL) 325 MG tablet Take 2 tablets (650 mg total) by mouth every 6 (six) hours as needed for mild pain. 12/01/22   Standley Brooking, MD  cephALEXin (KEFLEX) 500 MG capsule Take 1 capsule (500 mg) by mouth 4 times daily. 03/14/23   Small, Brooke L, PA  doxycycline (VIBRAMYCIN) 100 MG capsule Take 1 capsule (100 mg) by mouth 2 times daily. 03/14/23   Small, Brooke L, PA  gabapentin (NEURONTIN) 300 MG capsule Take 1 capsule (300 mg total) by mouth 3 (three) times daily. 02/24/23   Pickenpack-Cousar, Arty Baumgartner, NP  lidocaine (XYLOCAINE) 2 % jelly Apply as needed as directed. Do not use more than 30mL or 600mg  in a 12 hour period. 03/14/23   Small, Brooke L, PA  metroNIDAZOLE (FLAGYL) 500 MG tablet Take 1 tablet (500 mg) by mouth 2 times daily. 03/14/23   Small, Brooke L, PA  mirtazapine (REMERON) 15 MG tablet Take 1 tablet (15 mg total) by mouth at bedtime. 12/11/22   Pickenpack-Cousar, Arty Baumgartner, NP  oxyCODONE-acetaminophen (PERCOCET/ROXICET) 5-325 MG tablet Take 1 tablet by mouth every 6 hours as needed for severe pain. 03/14/23   Small, Brooke L, PA  pantoprazole (PROTONIX) 40 MG tablet Take 1 tablet (40 mg total) by mouth daily. Patient not taking: Reported on 12/25/2022 12/01/22 01/30/23  Standley Brooking, MD  polyethylene glycol [facility] /  GLYCOLAX) 17 g packet Take 17 g by mouth daily as needed for mild constipation. 12/01/22   Standley Brooking, MD  predniSONE (DELTASONE) 20 MG tablet Take 3 tablets (60 mg total) by mouth daily with breakfast for 7 days, THEN 2 tablets (40 mg total) daily with breakfast for 7 days, THEN 1 tablet (20 mg total) daily with breakfast for 28 days. 02/24/23 04/07/23  Fuller Plan, MD  Tofacitinib Citrate ER (XELJANZ XR) 11 MG  TB24 Take 1 tablet (11 mg total) by mouth daily. 01/08/23   Rice, Jamesetta Orleans, MD  valACYclovir (VALTREX) 1000 MG tablet Take 1 tablet by mouth 2 times daily for 10 days. 03/14/23 03/25/23  Small, Brooke L, PA     Vital Signs:    Code Status:   Physical Exam  Imaging: No results found.  Labs:  CBC: Recent Labs    12/25/22 0903 01/11/23 0939 02/24/23 0820 03/14/23 1255  WBC 7.1 10.7* 10.9* 10.8*  HGB 11.5* 10.7* 10.4* 11.1*  HCT 36.1 34.1* 33.2* 37.0  PLT 335 422* 587* 313    COAGS: Recent Labs    04/22/22 0549  INR 1.0    BMP: Recent Labs    11/29/22 1529 11/30/22 0656 12/25/22 0903 01/11/23 0939 02/24/23 0820 03/14/23 1255  NA  --  136 137 138 137 136  K  --  3.7 4.1 3.7 4.5 3.6  CL  --  103 102 106 102 101  CO2  --  24 27 27 25 24   GLUCOSE  --  120* 101* 122* 109* 95  BUN  --  11 16 16 12 12   CALCIUM  --  9.0 9.4 9.2 9.9 9.2  CREATININE 0.74 0.64 0.61 0.64 0.60 0.65  GFRNONAA >60 >60  --  >60  --  >60    LIVER FUNCTION TESTS: Recent Labs    09/18/22 0535 11/29/22 0906 12/25/22 0903 01/11/23 0939 02/24/23 0820 03/14/23 1255  BILITOT 0.5 2.0* 0.6 0.3 0.2 0.6  AST 21 19 17  9* 13 15  ALT 21 16 42* 11 14 34  ALKPHOS 90 89  --  75  --  76  PROT 8.5* 8.7* 7.4 7.6 8.1 7.9  ALBUMIN 3.1* 2.9*  --  3.5  --  3.5    Assessment and Plan: 32 year old female with past medical history of anemia, bipolar disorder, alcohol abuse, prior suicide attempt, marijuana abuse, rheumatoid arthritis and history of squamous cell carcinoma of the anus diagnosed in 2023.  PET scan on 02/26/2023 revealed:  1. Persistent increased radiotracer uptake associated with the anus compatible with known anal cancer. 2. Hypermetabolic bilateral axillary, bilateral subpectoral, right supraclavicular, retroperitoneal and right iliac side chain lymph nodes concerning for nodal metastatic disease. 3. No scintigraphic evidence of hypermetabolic solid organ or osseous  metastasis  She is scheduled today for image guided right axillary lymph node biopsy for further evaluation.Risks and benefits of procedure was discussed with the patient  including, but not limited to bleeding, infection, damage to adjacent structures or low yield requiring additional tests.  All of the questions were answered and there is agreement to proceed.  Consent signed and in chart.    Electronically Signed: D. Jeananne Rama, PA-C 03/19/2023, 5:53 PM   I spent a total of 25 minutes at the the patient's bedside AND on the patient's hospital floor or unit, greater than 50% of which was counseling/coordinating care for image guided right axillary lymph node biopsy

## 2023-03-20 ENCOUNTER — Other Ambulatory Visit: Payer: Self-pay | Admitting: Nurse Practitioner

## 2023-03-20 DIAGNOSIS — C210 Malignant neoplasm of anus, unspecified: Secondary | ICD-10-CM

## 2023-03-20 DIAGNOSIS — Z515 Encounter for palliative care: Secondary | ICD-10-CM

## 2023-03-20 DIAGNOSIS — R630 Anorexia: Secondary | ICD-10-CM

## 2023-03-20 DIAGNOSIS — G893 Neoplasm related pain (acute) (chronic): Secondary | ICD-10-CM

## 2023-03-20 DIAGNOSIS — F32A Depression, unspecified: Secondary | ICD-10-CM

## 2023-03-20 DIAGNOSIS — R634 Abnormal weight loss: Secondary | ICD-10-CM

## 2023-03-22 ENCOUNTER — Other Ambulatory Visit (HOSPITAL_COMMUNITY): Payer: Self-pay

## 2023-03-22 ENCOUNTER — Ambulatory Visit (HOSPITAL_COMMUNITY)
Admission: RE | Admit: 2023-03-22 | Discharge: 2023-03-22 | Disposition: A | Discharge status: Home / Self Care | Payer: Medicaid Other | Source: Ambulatory Visit | Attending: Hematology | Admitting: Hematology

## 2023-03-22 ENCOUNTER — Encounter: Payer: Self-pay | Admitting: Physician Assistant

## 2023-03-22 ENCOUNTER — Encounter (HOSPITAL_COMMUNITY): Payer: Self-pay

## 2023-03-22 ENCOUNTER — Other Ambulatory Visit: Payer: Self-pay | Admitting: Hematology

## 2023-03-22 ENCOUNTER — Other Ambulatory Visit: Payer: Self-pay

## 2023-03-22 ENCOUNTER — Encounter: Payer: Self-pay | Admitting: Hematology

## 2023-03-22 ENCOUNTER — Ambulatory Visit (HOSPITAL_COMMUNITY): Admission: RE | Admit: 2023-03-22 | Payer: Medicaid Other | Source: Ambulatory Visit

## 2023-03-22 DIAGNOSIS — C210 Malignant neoplasm of anus, unspecified: Secondary | ICD-10-CM | POA: Diagnosis present

## 2023-03-22 LAB — CBC WITH DIFFERENTIAL/PLATELET
Abs Immature Granulocytes: 0.04 10*3/uL (ref 0.00–0.07)
Basophils Absolute: 0 10*3/uL (ref 0.0–0.1)
Basophils Relative: 0 %
Eosinophils Absolute: 0.1 10*3/uL (ref 0.0–0.5)
Eosinophils Relative: 1 %
HCT: 37.8 % (ref 36.0–46.0)
Hemoglobin: 11.5 g/dL — ABNORMAL LOW (ref 12.0–15.0)
Immature Granulocytes: 1 %
Lymphocytes Relative: 18 %
Lymphs Abs: 1.2 10*3/uL (ref 0.7–4.0)
MCH: 26.1 pg (ref 26.0–34.0)
MCHC: 30.4 g/dL (ref 30.0–36.0)
MCV: 85.7 fL (ref 80.0–100.0)
Monocytes Absolute: 0.5 10*3/uL (ref 0.1–1.0)
Monocytes Relative: 8 %
Neutro Abs: 5 10*3/uL (ref 1.7–7.7)
Neutrophils Relative %: 72 %
Platelets: 303 10*3/uL (ref 150–400)
RBC: 4.41 MIL/uL (ref 3.87–5.11)
RDW: 19.4 % — ABNORMAL HIGH (ref 11.5–15.5)
WBC: 6.9 10*3/uL (ref 4.0–10.5)
nRBC: 0 % (ref 0.0–0.2)

## 2023-03-22 LAB — BASIC METABOLIC PANEL
Anion gap: 7 (ref 5–15)
BUN: 10 mg/dL (ref 6–20)
CO2: 24 mmol/L (ref 22–32)
Calcium: 9.2 mg/dL (ref 8.9–10.3)
Chloride: 105 mmol/L (ref 98–111)
Creatinine, Ser: 0.69 mg/dL (ref 0.44–1.00)
GFR, Estimated: >60 mL/min (ref >60)
Glucose, Bld: 108 mg/dL — ABNORMAL HIGH (ref 70–99)
Potassium: 3.8 mmol/L (ref 3.5–5.1)
Sodium: 136 mmol/L (ref 135–145)

## 2023-03-22 LAB — PROTIME-INR
INR: 1 (ref 0.8–1.2)
Prothrombin Time: 13 seconds (ref 11.4–15.2)

## 2023-03-22 MED ORDER — LIDOCAINE HCL 1 % IJ SOLN
INTRAMUSCULAR | Status: AC
  Filled 2023-03-22: qty 20

## 2023-03-22 MED ORDER — SODIUM CHLORIDE 0.9 % IV SOLN: INTRAVENOUS | Status: DC

## 2023-03-22 MED ORDER — FENTANYL CITRATE (PF) 100 MCG/2ML IJ SOLN
INTRAMUSCULAR | Status: AC
  Filled 2023-03-22: qty 2

## 2023-03-22 MED ORDER — MIDAZOLAM HCL 2 MG/2ML IJ SOLN
INTRAMUSCULAR | Status: AC
  Filled 2023-03-22: qty 2

## 2023-03-22 NOTE — Procedures (Signed)
Vascular and Interventional Radiology Procedure Note  Patient: Tammy Garrison DOB: 1990-11-29 Medical Record Number: [id number] Note Date/Time: 03/22/23 1:17 PM   Performing Physician: Roanna Banning, MD Assistant(s): None  Diagnosis: Hx anal CA. HM R axillary LNs on PET   Procedure: *Aborted* RIGHT AXILLARY LYMPH NODE BIOPSY  Anesthesia:  None Complications: None Estimated Blood Loss:  0 mL Specimens: Sent for None  Findings:  Limited R axillary [place] without evidence of adenopathy. No R axillary LN identified. No Bx was performed  Plan: Bed rest for 0 hours.  See detailed procedure note with images in PACS. The patient tolerated the procedure well without incident or complication and was returned to Recovery in stable condition.    Roanna Banning, MD [facility]   Pager. [phone] Clinic. [phone]

## 2023-03-22 NOTE — Progress Notes (Signed)
Procedure aborted and pt returned to recovery room. IV removed. No s/s of distress at this time. Discharged home.

## 2023-03-22 NOTE — Discharge Instructions (Signed)
Please call Interventional Radiology clinic [phone] with any questions or concerns.  You may remove your dressing and shower tomorrow.  After the procedure, it is common to have: Soreness Bruising Mild pain  Follow these instructions at home:  Medication: Do not use Aspirin or ibuprofen products, such as Advil or Motrin, as it may increase bleeding  You may resume your usual medications as ordered by your doctor If your doctor prescribed antibiotics, take them as directed. Do not stop taking them just because you feel better. You need to take the full course of antibiotics  Eating and drinking: Drink plenty of liquids to keep your urine pale yellow You can resume your regular diet as directed by your doctor   Care of the procedure site Wash your hands with soap and water before you change your bandage (dressing). If you cannot use soap and water, use hand sanitizer. Check your puncture site every day for signs of infection. Check for: Redness, swelling, or pain Fluid or blood Pus or a bad smell Warmth Activity Do not take baths, swim, or use a hot tub until your health care provider approves  Keep all follow-up visits as told by your doctor  Contact a doctor if you have: A fever Redness, swelling, or pain at the puncture site, and it lasts longer than a few days Fluid, blood, or pus coming from the puncture site Warmth coming from the puncture site  Get help right away if: You have a lot of bleeding from the puncture site   Moderate Conscious Sedation-Care After   This sheet gives you information about how to care for yourself after your procedure. Your health care provider may also give you more specific instructions. If you have problems or questions, contact your health care provider.  After the procedure, it is common to have: Sleepiness for several hours. Impaired judgment for several hours. Difficulty with balance. Vomiting if you eat too soon.  Follow  these instructions at home:  Rest. Do not participate in activities where you could fall or become injured. Do not drive or use machinery. Do not drink alcohol. Do not take sleeping pills or medicines that cause drowsiness. Do not make important decisions or sign legal documents. Do not take care of children on your own.  Eating and drinking Follow the diet recommended by your health care provider. Drink enough fluid to keep your urine pale yellow. If you vomit: Drink water, juice, or soup when you can drink without vomiting. Make sure you have little or no nausea before eating solid foods.  General instructions Take over-the-counter and prescription medicines only as told by your health care provider. Have a responsible adult stay with you for the time you are told. It is important to have someone help care for you until you are awake and alert. Do not smoke. Keep all follow-up visits as told by your health care provider. This is important.  Contact a health care provider if: You are still sleepy or having trouble with balance after 24 hours. You feel light-headed. You keep feeling nauseous or you keep vomiting. You develop a rash. You have a fever. You have redness or swelling around the IV site.  Get help right away if: You have trouble breathing. You have new-onset confusion at home.  This information is not intended to replace advice given to you by your health care provider. Make sure you discuss any questions you have with your healthcare provider.

## 2023-03-23 ENCOUNTER — Other Ambulatory Visit: Payer: Self-pay | Admitting: Nurse Practitioner

## 2023-03-23 ENCOUNTER — Other Ambulatory Visit (HOSPITAL_COMMUNITY): Payer: Self-pay

## 2023-03-23 ENCOUNTER — Other Ambulatory Visit: Payer: Self-pay

## 2023-03-23 ENCOUNTER — Other Ambulatory Visit: Payer: Self-pay | Admitting: Internal Medicine

## 2023-03-23 DIAGNOSIS — M792 Neuralgia and neuritis, unspecified: Secondary | ICD-10-CM

## 2023-03-23 DIAGNOSIS — R630 Anorexia: Secondary | ICD-10-CM

## 2023-03-23 DIAGNOSIS — R634 Abnormal weight loss: Secondary | ICD-10-CM

## 2023-03-23 DIAGNOSIS — C210 Malignant neoplasm of anus, unspecified: Secondary | ICD-10-CM

## 2023-03-23 DIAGNOSIS — F32A Depression, unspecified: Secondary | ICD-10-CM

## 2023-03-23 DIAGNOSIS — M069 Rheumatoid arthritis, unspecified: Secondary | ICD-10-CM

## 2023-03-23 DIAGNOSIS — Z515 Encounter for palliative care: Secondary | ICD-10-CM

## 2023-03-23 DIAGNOSIS — G893 Neoplasm related pain (acute) (chronic): Secondary | ICD-10-CM

## 2023-03-24 NOTE — Progress Notes (Signed)
Office Visit Note  Patient: Tammy Garrison             Date of Birth: March 06, 1991           MRN: [id number]             PCP: [facility] Referring: [facility] Visit Date: 04/07/2023   Subjective:  Follow-up (Patient states she went to the doctor and was informed she has an STD. Patient was told to stop taking Harriette Ohara because it could cause her outbreaks. Patient would like to talk about Harriette Ohara. )   History of Present Illness: Tammy Garrison is a 32 y.o. 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  She developed an outbreak of painful vesicular rashes on gentle seen at the emergency department diagnosis for genital herpes outbreak with concern about this triggered by her Harriette Ohara and steroid treatment.  She took oral antibiotics course and valacyclovir with resolution of this outbreak.  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  When on the prednisone taper at 60 and 40 mg had significant improvement. Since decreasing to 20 mg and stopping she has swelling and severe pain and stiffness again.  This interferes with her basic activities of daily living such as getting dressed and she is using a cane for support while walking.  Also currently feels some exacerbation of her anxiety and depressive symptoms off her mirtazapine for the past month has not been to get back in with a primary care office yet for maintenance medication refills.  Previous HPI 02/24/2023 Tammy Garrison is a 32 y.o. 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  She saw partial benefit when on the 30 mg prednisone but has really not been doing well at any point since her last visit.  Still with joint pain and swelling in multiple areas.  Has  trouble standing up due to leg pains and cannot use her arms to push off easily due to her pain especially left elbow.  Over the interval she had follow-up scans with oncology including PET scan on July 5 this was concerning for hypermetabolic regional lymph nodes around the associated anal squamous cell cancer.  She was recommended for new scope and biopsy has not yet been established went over with him so far.  Currently symptoms completely prevent her from working and are limiting even for her ADLs requiring assistance to get back and forth to the bathroom and move around the house.   Previous HPI 12/25/22 Tammy Garrison is a 32 y.o. female here for evaluation and management of seropositive rheumatoid arthritis.  Originally diagnosed with onset of symptoms and 2019 and saw Dr. Kathi Ludwig for this.  She was initially treated with methotrexate which was ineffective subsequently treated with Harriette Ohara with a good improvement.  She discontinued treatment in 2021 due to cost being prohibitive also was in disease remission at the time.  She continued to do well off of any maintenance therapy until last year after diagnosis with anal carcinoma then treated with concurrent chemoradiation therapy and redeveloped significant joint inflammation in multiple areas.  Her treatments and symptoms that put her out of work most of the past year.  The past few months joint inflammation is getting worse  has been seen at the hospital multiple times and treated with prolonged steroid tapers twice in February and April.  She see symptom relief when started on the high-dose steroids typically with some starting to return near the end of the taper and then gets a severe flare again within a few days of discontinuing medicine.  Currently she is off any steroids for 4 days and has severe joint pain and swelling in multiple areas. She also has hidradenitis suppurativa with involvement of the axillary, intertriginous, and groin areas.   No current open cysts or drainage.  Does notice some hardened mildly tender area along the left lower breast.  She was never on prolonged antibiotics and does not see dermatology for this condition.  HS symptoms were also quiescent during the few years off treatment before her cancer last year. No history of frequent respiratory or urinary tract infections.  She has had problems with bleeding and blood clots associated with the anal cancer but not prior to this.  No shortness of breath or persistent cough.   DMARD Hx Harriette Ohara - stopped 2021 due to cost and disease remission MTX - nonresponder   Review of Systems  Constitutional:  Positive for fatigue.  HENT:  Negative for mouth sores and mouth dryness.   Eyes:  Negative for dryness.  Respiratory:  Positive for shortness of breath.   Cardiovascular:  Positive for chest pain and palpitations.  Gastrointestinal:  Positive for blood in stool. Negative for constipation and diarrhea.  Endocrine: Positive for increased urination.  Genitourinary:  Positive for involuntary urination.  Musculoskeletal:  Positive for joint pain, gait problem, joint pain, joint swelling, myalgias, muscle weakness, morning stiffness, muscle tenderness and myalgias.  Skin:  Positive for sensitivity to sunlight. Negative for color change, rash and hair loss.  Allergic/Immunologic: Positive for susceptible to infections.  Neurological:  Positive for dizziness and headaches.  Hematological:  Positive for swollen glands.  Psychiatric/Behavioral:  Positive for depressed mood and sleep disturbance. The patient is nervous/anxious.     PMFS History:  Patient Active Problem List   Diagnosis Date Noted   Rheumatoid arthritis (HCC) 02/10/2023   Hidradenitis suppurativa 01/04/2023   High risk medication use 12/25/2022   Rheumatoid arthritis flare (HCC) 11/29/2022   Hyperkalemia 11/29/2022   Joint swelling 09/18/2022   Joint pain 09/18/2022   Fibroids 04/23/2022   Obesity  (BMI 30-39.9) 04/22/2022   Abscess of right axilla 04/20/2022   Pancytopenia (HCC) 04/20/2022   PICC (peripherally inserted central catheter) in place 03/14/2022   Dehydration 03/13/2022   Nausea with vomiting 03/13/2022   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   Bipolar 1 disorder (HCC) 02/23/2022   RA (rheumatoid arthritis) (HCC) 02/07/2022   Syncope 02/06/2022   ABLA (acute blood loss anemia) 02/06/2022   BRBPR (bright red blood per rectum) 02/06/2022   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   Depression 05/20/2016   Marijuana dependence (HCC) 05/19/2016    Past Medical History:  Diagnosis Date   Anemia    Bipolar 1 disorder (HCC)    Cancer (HCC)    squamous cell rectal cancer   ETOH abuse    H/O self-harm    H/O suicide attempt    x 5 - last 05/2016 - overdose Ibuprofen   Marijuana abuse    Rheumatoid arthritis (HCC)     Family History  Problem Relation Age of Onset   Lupus Mother    Hypertension Other    Past Surgical History:  Procedure Laterality Date   HEMORRHOID SURGERY N/A 02/08/2022   Procedure: EXTERNAL AND INTERNAL HEMORRHOIDECTOMY;  Surgeon: Violeta Gelinas, MD;  Location: [facility] OR;  Service: General;  Laterality: N/A;   INCISION AND DRAINAGE ABSCESS Right 04/22/2022   Procedure: INCISION AND DRAINAGE axilla;  Surgeon: Sheliah Hatch, De Blanch, MD;  Location: [facility] ORS;  Service: General;  Laterality: Right;   RECTAL EXAM UNDER ANESTHESIA N/A 07/21/2022   Procedure: RECTAL EXAM UNDER ANESTHESIA;  Surgeon: Andria Meuse, MD;  Location: [facility] OR;  Service: General;  Laterality: N/A;   TRANSANAL HEMORRHOIDAL DEARTERIALIZATION N/A 07/21/2022   Procedure: ANAL CANAL BIOPSY;  Surgeon: Andria Meuse, MD;  Location: [facility] OR;  Service: General;  Laterality: N/A;   Social History   Social History Narrative   Not on file    There is no immunization history on file for this patient.   Objective: Vital Signs: BP (!) 146/102 (BP  Location: Right Arm, Patient Position: Sitting, Cuff Size: Normal)   Pulse 97   Resp 14   Ht 6' (1.829 m)   Wt 290 lb (131.5 kg)   LMP 05/18/2022 (Approximate)   BMI 39.33 kg/m    Physical Exam Constitutional:      Appearance: She is obese.  Eyes:     Conjunctiva/sclera: Conjunctivae normal.  Cardiovascular:     Rate and Rhythm: Normal rate and regular rhythm.  Pulmonary:     Effort: Pulmonary effort is normal.     Breath sounds: Normal breath sounds.  Musculoskeletal:     Right lower leg: No edema.     Left lower leg: No edema.  Lymphadenopathy:     Cervical: No cervical adenopathy.  Skin:    General: Skin is warm and dry.  Neurological:     Mental Status: She is alert.  Psychiatric:        Mood and Affect: Mood normal.      Musculoskeletal Exam:  Shoulders full ROM, pain with active movement Bilateral elbow tenderness, left with palpable swelling and severely limited ROM with guarding Right wrist pain swelling and decreased ROM Fingers grip ROM about 80% normal, 2nd-3rd MCP swelling and tenderness Right knee swelling tenderness and decreased ROM   CDAI Exam: CDAI Score: 20  Patient Global: 60 / 100; Provider Global: 30 / 100 Swollen: 2 ; Tender: 9  Joint Exam 04/07/2023      Right  Left  Elbow     Swollen Tender  Wrist  Swollen Tender   Tender  MCP 2   Tender   Tender  MCP 3   Tender   Tender  Knee   Tender   Tender     Investigation: No additional findings.  Imaging: [place] AXILLA RIGHT  Result Date: 03/22/2023 CLINICAL DATA:  right axillary lymph node measures 17 mm EXAM: Procedures: 1. LIMITED RIGHT AXILLARY ULTRASOUND 2. ABORTED RIGHT AXILLARY LYMPH NODE BIOPSY COMPARISON:  PET-CT, 02/05/2023. FINDINGS: Focused preprocedural ultrasound at the area of concern, along the RIGHT axilla. Previously-demonstrated pathologically-enlarged RIGHT axillary lymph node seen on comparison PET-CT was not sonographically apparent. Planned biopsy was aborted. IMPRESSION:  RIGHT axillary lymph node seen on comparison CT was not sonographically apparent. Planned axillary lymph node biopsy was NOT performed Roanna Banning, MD [facility] Radiology Electronically Signed   By: Roanna Banning M.D.   On: 03/22/2023 17:26   CT ABDOMEN PELVIS W CONTRAST  Result Date: 03/14/2023 CLINICAL DATA:  History of anal squamous cell carcinoma with blistering rash of the  labia. * Tracking Code: BO * EXAM: CT ABDOMEN AND PELVIS WITH CONTRAST TECHNIQUE: Multidetector CT imaging of the abdomen and pelvis was performed using the standard protocol following bolus administration of intravenous contrast. RADIATION DOSE REDUCTION: This exam was performed according to the departmental dose-optimization program which includes automated exposure control, adjustment of the mA and/or kV according to patient size and/or use of iterative reconstruction technique. CONTRAST:  OMNIPAQUE IOHEXOL 300 MG/ML  SOLN COMPARISON:  PET-CT dated 02/05/2023, CT abdomen and pelvis dated 01/12/2023 FINDINGS: Lower chest: No focal consolidation or pulmonary nodule in the lung bases. No pleural effusion or pneumothorax demonstrated. Partially imaged heart size is normal. Hepatobiliary: Ill-defined peripheral segment 8 enhancement (2:23), likely perfusional. No intra or extrahepatic biliary ductal dilation. Normal gallbladder. Pancreas: No focal lesions or main ductal dilation. Spleen: Normal in size without focal abnormality. Adrenals/Urinary Tract: No adrenal nodules. No suspicious renal mass, calculi or hydronephrosis. No focal bladder wall thickening. Stomach/Bowel: Normal appearance of the stomach. Slightly decreased conspicuity of anal soft tissue thickening. Normal appendix. Vascular/Lymphatic: No significant vascular findings are present. Slightly decreased size of 0.9 cm right common iliac lymph node (2:54), previously 1.1 cm. Reproductive: No adnexal masses. Unchanged non  FDG-avid heterogeneously enhancing uterine leiomyomata. Other: No free fluid, fluid collection, or free air. Musculoskeletal: No acute or abnormal lytic or blastic osseous lesions. Asymmetric soft tissue thickening and stranding of the right medial labia majora. No discrete underlying fluid collection. IMPRESSION: Compared to 02/05/2023, 1. Slightly decreased conspicuity of anal soft tissue thickening in keeping with known primary anal malignancy. 2. Asymmetric soft tissue thickening and stranding of the right medial labia majora, which may reflect cellulitis. No discrete underlying fluid collection. 3. Slightly decreased size of 0.9 cm right common iliac lymph node. Electronically Signed   By: Agustin Cree M.D.   On: 03/14/2023 15:48    Recent Labs: Lab Results  Component Value Date   WBC 6.9 03/22/2023   HGB 11.5 (L) 03/22/2023   PLT 303 03/22/2023   NA 136 03/22/2023   K 3.8 03/22/2023   CL 105 03/22/2023   CO2 24 03/22/2023   GLUCOSE 108 (H) 03/22/2023   BUN 10 03/22/2023   CREATININE 0.69 03/22/2023   BILITOT 0.6 03/14/2023   ALKPHOS 76 03/14/2023   AST 15 03/14/2023   ALT 34 03/14/2023   PROT 7.9 03/14/2023   ALBUMIN 3.5 03/14/2023   CALCIUM 9.2 03/22/2023   GFRAA >60 04/12/2018   QFTBGOLDPLUS NEGATIVE 12/25/2022    Speciality Comments: No specialty comments available.  Procedures:  No procedures performed Allergies: Coconut flavor   Assessment / Plan:     Visit Diagnoses: Rheumatoid arthritis involving multiple sites, unspecified whether rheumatoid factor present (HCC) - Plan: predniSONE (DELTASONE) 20 MG tablet, leflunomide (ARAVA) 20 MG tablet  Still appears to be in moderate or high disease activity with several swollen and many tender joints.  Stopping Harriette Ohara due to comorbidities and associated risk factor.  Will start on leflunomide 20 mg daily.  Will repeat short-term prednisone taper using high-dose but finished within 3 weeks. She asked about short-term disability I  recommended we could supply supporting documents but usually defer formal disability assessment to primary care or OT/FCE setting.  High risk medication use - Xeljanz 11 mg daily. High-dose prednisone taper for now starting at 60 mg daily. Prednisone 10 MG discontinued on 02/24/2023.  Recent labs reviewed including metabolic panel and blood count from late August were normal.  Discussed risk of Xeljanz in the setting  of herpes virus breakout episode and with squamous cell carcinoma so need to discontinue this.  Discussed risks for leflunomide including GI intolerance, cytopenia, hepatotoxicity, infections, the need for regular lab monitoring.  Also reviewed risks with repeating prednisone taper has had a significant weight gain already earlier this year so would limit to short-term tapers only.  Anal squamous cell carcinoma (HCC)  Recent scan indicating active with some regional lymph node metabolic increase making plans for new scope with biopsy.  Not approved yet she anticipates this will probably be in October.  Chronic pain syndrome - Plan: DULoxetine (CYMBALTA) 30 MG capsule  Also discussed some of her chronic pain may not all be due to acute inflammation and multifactorial.  Especially with current anxiety and depressive symptoms not well-controlled.  Previously was on gabapentin but required dose titration and overall loss of efficacy so stopped taking this.  Recommend we try starting on duloxetine 30 mg daily with possibly to titrate up if helpful.  Orders: No orders of the defined types were placed in this encounter.  Meds ordered this encounter  Medications   predniSONE (DELTASONE) 20 MG tablet    Sig: Take 3 tablets (60 mg total) by mouth daily with breakfast for 7 days, THEN 2 tablets (40 mg total) daily with breakfast for 7 days, THEN 1 tablet (20 mg total) daily with breakfast for 7 days.    Dispense:  42 tablet    Refill:  0   leflunomide (ARAVA) 20 MG tablet    Sig: Take 1  tablet (20 mg total) by mouth daily.    Dispense:  30 tablet    Refill:  1   DULoxetine (CYMBALTA) 30 MG capsule    Sig: Take 1 capsule (30 mg total) by mouth daily.    Dispense:  30 capsule    Refill:  1   Greater than 30 minutes were spent on encounter today including review of recent medical events and laboratory test results, detailed history and patient examination, counseling on multiple medication treatment options and associated risk factors in the setting of her comorbidities and counseling for multiple new medication start.  Follow-Up Instructions: Return in about 5 weeks (around 05/12/2023) for RA LEF start f/u 4-6wks.   Fuller Plan, MD  Note - This record has been created using [employer].  Chart creation errors have been sought, but may not always  have been located. Such creation errors do not reflect on  the standard of medical care.

## 2023-03-26 ENCOUNTER — Ambulatory Visit: Payer: Medicaid Other | Admitting: Hematology

## 2023-03-30 ENCOUNTER — Other Ambulatory Visit (HOSPITAL_COMMUNITY): Payer: Self-pay

## 2023-03-31 ENCOUNTER — Other Ambulatory Visit (HOSPITAL_COMMUNITY): Payer: Self-pay

## 2023-03-31 ENCOUNTER — Encounter: Payer: Self-pay | Admitting: Hematology

## 2023-04-01 ENCOUNTER — Other Ambulatory Visit: Payer: Self-pay | Admitting: Nurse Practitioner

## 2023-04-01 DIAGNOSIS — G893 Neoplasm related pain (acute) (chronic): Secondary | ICD-10-CM

## 2023-04-01 DIAGNOSIS — F32A Depression, unspecified: Secondary | ICD-10-CM

## 2023-04-01 DIAGNOSIS — R634 Abnormal weight loss: Secondary | ICD-10-CM

## 2023-04-01 DIAGNOSIS — R630 Anorexia: Secondary | ICD-10-CM

## 2023-04-01 DIAGNOSIS — C210 Malignant neoplasm of anus, unspecified: Secondary | ICD-10-CM

## 2023-04-01 DIAGNOSIS — Z515 Encounter for palliative care: Secondary | ICD-10-CM

## 2023-04-01 DIAGNOSIS — M792 Neuralgia and neuritis, unspecified: Secondary | ICD-10-CM

## 2023-04-02 ENCOUNTER — Other Ambulatory Visit: Payer: Self-pay | Admitting: Internal Medicine

## 2023-04-02 ENCOUNTER — Other Ambulatory Visit: Payer: Self-pay

## 2023-04-02 ENCOUNTER — Other Ambulatory Visit (HOSPITAL_COMMUNITY): Payer: Self-pay

## 2023-04-02 DIAGNOSIS — M0579 Rheumatoid arthritis with rheumatoid factor of multiple sites without organ or systems involvement: Secondary | ICD-10-CM

## 2023-04-02 DIAGNOSIS — Z79899 Other long term (current) drug therapy: Secondary | ICD-10-CM

## 2023-04-02 MED ORDER — XELJANZ XR 11 MG PO TB24
11.0000 mg | ORAL_TABLET | Freq: Every day | ORAL | 0 refills | Status: DC
Start: 2023-04-02 — End: 2023-04-07
  Filled 2023-04-02: qty 30, 30d supply, fill #0

## 2023-04-02 NOTE — Telephone Encounter (Signed)
Last Fill: 01/08/2023  Labs: 03/22/2023 CBC & BMP: hemoglobin 11.5, RDW 19.4, glucose 108 03/14/2023 CMP WNL   TB Gold: 12/25/2022 negative    Next Visit: 04/07/2023  Last Visit: 02/24/2023  [id number] arthritis involving multiple sites, unspecified whether rheumatoid factor present   Current Dose per office note on 02/24/2023: Harriette Ohara 11 mg daily (HPI)  Okay to refill Harriette Ohara?

## 2023-04-03 ENCOUNTER — Other Ambulatory Visit (HOSPITAL_BASED_OUTPATIENT_CLINIC_OR_DEPARTMENT_OTHER): Payer: Self-pay

## 2023-04-06 ENCOUNTER — Encounter: Payer: Self-pay | Admitting: Hematology

## 2023-04-07 ENCOUNTER — Other Ambulatory Visit: Payer: Self-pay

## 2023-04-07 ENCOUNTER — Ambulatory Visit: Payer: Medicaid Other | Attending: Internal Medicine | Admitting: Internal Medicine

## 2023-04-07 ENCOUNTER — Other Ambulatory Visit (HOSPITAL_COMMUNITY): Payer: Self-pay

## 2023-04-07 ENCOUNTER — Encounter: Payer: Self-pay | Admitting: Internal Medicine

## 2023-04-07 VITALS — BP 146/102 | HR 97 | Resp 14 | Ht 72.0 in | Wt 290.0 lb

## 2023-04-07 DIAGNOSIS — M069 Rheumatoid arthritis, unspecified: Secondary | ICD-10-CM | POA: Diagnosis not present

## 2023-04-07 DIAGNOSIS — C210 Malignant neoplasm of anus, unspecified: Secondary | ICD-10-CM

## 2023-04-07 DIAGNOSIS — G894 Chronic pain syndrome: Secondary | ICD-10-CM

## 2023-04-07 DIAGNOSIS — Z79899 Other long term (current) drug therapy: Secondary | ICD-10-CM

## 2023-04-07 MED ORDER — LEFLUNOMIDE 20 MG PO TABS
20.0000 mg | ORAL_TABLET | Freq: Every day | ORAL | 1 refills | Status: DC
Start: 2023-04-07 — End: 2023-05-12
  Filled 2023-04-07 (×2): qty 30, 30d supply, fill #0
  Filled 2023-05-02: qty 30, 30d supply, fill #1

## 2023-04-07 MED ORDER — PREDNISONE 20 MG PO TABS
ORAL_TABLET | ORAL | 0 refills | Status: DC
Start: 2023-04-07 — End: 2023-04-29
  Filled 2023-04-07 (×2): qty 42, 21d supply, fill #0

## 2023-04-07 MED ORDER — DULOXETINE HCL 30 MG PO CPEP
30.0000 mg | ORAL_CAPSULE | Freq: Every day | ORAL | 1 refills | Status: DC
Start: 2023-04-07 — End: 2023-05-12
  Filled 2023-04-07 (×2): qty 30, 30d supply, fill #0
  Filled 2023-05-02: qty 30, 30d supply, fill #1

## 2023-04-07 NOTE — Patient Instructions (Signed)
Leflunomide Tablets What is this medication? LEFLUNOMIDE (le FLOO na mide) treats the symptoms of rheumatoid arthritis. It works by slowing down an overactive immune system. This decreases inflammation. It belongs to a group of medications called DMARDs. This medicine may be used for other purposes; ask your health care provider or pharmacist if you have questions. COMMON BRAND NAME(S): Arava What should I tell my care team before I take this medication? They need to know if you have any of these conditions: Cancer Diabetes High blood pressure Immune system problems Infection Kidney disease Liver disease Low blood cell levels (white cells, red cells, and platelets) Lung or breathing disease, such as asthma or COPD Recent or upcoming vaccine Skin conditions Tingling of the fingers or toes, or other nerve disorder An unusual or allergic reaction to leflunomide, other medications, food, dyes, or preservatives Pregnant or trying to get pregnant Breastfeeding How should I use this medication? Take this medication by mouth with a full glass of water. Take it as directed on the prescription label at the same time every day. Keep taking it unless your care team tells you to stop. Talk to your care team about the use of this medication in children. Special care may be needed. Overdosage: If you think you have taken too much of this medicine contact a poison control center or emergency room at once. NOTE: This medicine is only for you. Do not share this medicine with others. What if I miss a dose? If you miss a dose, take it as soon as you can. If it is almost time for your next dose, take only that dose. Do not take double or extra doses. What may interact with this medication? Do not take this medication with any of the following: Teriflunomide This medication may also interact with the following: Alosetron Caffeine Cefaclor Certain medications for diabetes, such as nateglinide,  repaglinide, rosiglitazone, pioglitazone Certain medications for high cholesterol, such as atorvastatin, pravastatin, rosuvastatin, simvastatin Charcoal Cholestyramine Ciprofloxacin Duloxetine Estrogen and progestin hormones Furosemide Ketoprofen Live virus vaccines Medications that increase your risk for infection Methotrexate Mitoxantrone Paclitaxel Penicillin Theophylline Tizanidine Warfarin This list may not describe all possible interactions. Give your health care provider a list of all the medicines, herbs, non-prescription drugs, or dietary supplements you use. Also tell them if you smoke, drink alcohol, or use illegal drugs. Some items may interact with your medicine. What should I watch for while using this medication? Visit your care team for regular checks on your progress. Tell your care team if your symptoms do not start to get better or if they get worse. You may need blood work done while you are taking this medication. This medication may cause serious skin reactions. They can happen weeks to months after starting the medication. Contact your care team right away if you notice fevers or flu-like symptoms with a rash. The rash may be red or purple and then turn into blisters or peeling of the skin. You may also notice a red rash with swelling of the face, lips, or lymph nodes in your neck or under your arms. You should not receive certain vaccines during your treatment and for a certain time after your treatment with this medication ends. Talk to your care team for more information. This medication may stay in your body for up to 2 years after your last dose. Tell your care team about any unusual side effects or symptoms. A medication can be given to help lower your blood levels of this  medication more quickly. Talk to your care team if you may be pregnant. This medication can cause serious birth defects if taken during pregnancy and for a while after the last dose. You will  need a negative pregnancy test before starting this medication. Contraception is recommended while taking this medication and for a while after the last dose. Your care team can help you find the option that works for you. Do not breastfeed while taking this medication. What side effects may I notice from receiving this medication? Side effects that you should report to your care team as soon as possible: Allergic reactions--skin rash, itching, hives, swelling of the face, lips, tongue, or throat Dry cough, shortness of breath or trouble breathing Increase in blood pressure Infection--fever, chills, cough, sore throat, wounds that don't heal, pain or trouble when passing urine, general feeling of discomfort or being unwell Redness, blistering, peeling, or loosening of the skin, including inside the mouth Liver injury--right upper belly pain, loss of appetite, nausea, light-colored stool, dark yellow or brown urine, yellowing skin or eyes, unusual weakness or fatigue Pain, tingling, or numbness in the hands or feet Unusual bruising or bleeding Side effects that usually do not require medical attention (report to your care team if they continue or are bothersome): Back pain Diarrhea Hair loss Headache Nausea This list may not describe all possible side effects. Call your doctor for medical advice about side effects. You may report side effects to FDA at 1-800-FDA-1088. Where should I keep my medication? Keep out of the reach of children and pets. Store at room temperature between 20 and 25 degrees C (68 and 77 degrees F). Protect from moisture and light. Keep the container tightly closed. Get rid of any unused medication after the expiration date. To get rid of medications that are no longer needed or have expired: Take the medication to a medication take-back program. Check with your pharmacy or law enforcement to find a location. If you cannot return the medication, ask your pharmacist or  care team how to get rid of this medication safely. NOTE: This sheet is a summary. It may not cover all possible information. If you have questions about this medicine, talk to your doctor, pharmacist, or health care provider.  2024 Elsevier/Gold Standard (2021-12-18 00:00:00)

## 2023-04-09 ENCOUNTER — Other Ambulatory Visit (HOSPITAL_COMMUNITY): Payer: Self-pay

## 2023-04-12 ENCOUNTER — Other Ambulatory Visit (HOSPITAL_COMMUNITY): Payer: Self-pay

## 2023-04-21 ENCOUNTER — Other Ambulatory Visit (HOSPITAL_COMMUNITY): Payer: Self-pay

## 2023-04-28 NOTE — Progress Notes (Unsigned)
Office Visit Note  Patient: Tammy Garrison             Date of Birth: 11-Jul-1991           MRN: [id number]             PCP: [facility] Referring: [facility] Visit Date: 05/12/2023   Subjective:  Follow-up (Patient states she is breaking out really bad around her face and back. )   History of Present Illness: Tammy Garrison is a 32 y.o. 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  After last visit on the prednisone taper she felt a good improvement in symptoms while taking 60 mg and then 40 mg of prednisone but started to get more pain and swelling back at 20 mg and less.  So far not sure if the Cymbalta and leflunomide is making a big impact on her symptoms.  Still has pretty severe pain with any pressure and very limited in her activities.  Her friend is needing to help out up to an hour every day with basic daily activities.  She has not noticed a difference with starting the low-dose Cymbalta but no particular side effect.  She went for exam for nodule near the anus with biopsy pathology report was consistent for thrombosed hemorrhoid.  Previous HPI 04/07/2023 Tammy Garrison is a 32 y.o. 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  She developed an outbreak of painful vesicular rashes on gentle seen at the emergency department diagnosis for genital herpes outbreak with concern about this triggered by her Harriette Ohara and steroid treatment.  She took oral antibiotics course and valacyclovir with resolution of this outbreak.  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  When on the prednisone taper at 60 and 40 mg had significant improvement. Since decreasing to 20 mg and stopping she has swelling and severe pain and stiffness again.   This interferes with her basic activities of daily living such as getting dressed and she is using a cane for support while walking.  Also currently feels some exacerbation of her anxiety and depressive symptoms off her mirtazapine for the past month has not been to get back in with a primary care office yet for maintenance medication refills.   Previous HPI 02/24/2023 Tammy Garrison is a 32 y.o. 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  She saw partial benefit when on the 30 mg prednisone but has really not been doing well at any point since her last visit.  Still with joint pain and swelling in multiple areas.  Has trouble standing up due to leg pains and cannot use her arms to push off easily due to her pain especially left elbow.  Over the interval she had follow-up scans with oncology including PET scan on July 5 this was concerning for hypermetabolic regional lymph nodes around the associated anal squamous cell cancer.  She was recommended for new scope and biopsy has not yet been established went over with him so far.  Currently symptoms completely prevent her from working and are limiting even for her ADLs requiring assistance to get back and forth to the bathroom and move around the house.   Previous  HPI 12/25/22 Tammy Garrison is a 32 y.o. female here for evaluation and management of seropositive rheumatoid arthritis.  Originally diagnosed with onset of symptoms and 2019 and saw Dr. Kathi Ludwig for this.  She was initially treated with methotrexate which was ineffective subsequently treated with Harriette Ohara with a good improvement.  She discontinued treatment in 2021 due to cost being prohibitive also was in disease remission at the time.  She continued to do well off of any maintenance therapy until last year after diagnosis with anal carcinoma then treated with concurrent chemoradiation therapy and redeveloped significant joint  inflammation in multiple areas.  Her treatments and symptoms that put her out of work most of the past year.  The past few months joint inflammation is getting worse has been seen at the hospital multiple times and treated with prolonged steroid tapers twice in February and April.  She see symptom relief when started on the high-dose steroids typically with some starting to return near the end of the taper and then gets a severe flare again within a few days of discontinuing medicine.  Currently she is off any steroids for 4 days and has severe joint pain and swelling in multiple areas. She also has hidradenitis suppurativa with involvement of the axillary, intertriginous, and groin areas.  No current open cysts or drainage.  Does notice some hardened mildly tender area along the left lower breast.  She was never on prolonged antibiotics and does not see dermatology for this condition.  HS symptoms were also quiescent during the few years off treatment before her cancer last year. No history of frequent respiratory or urinary tract infections.  She has had problems with bleeding and blood clots associated with the anal cancer but not prior to this.  No shortness of breath or persistent cough.   DMARD Hx Harriette Ohara - stopped 2021 due to cost and disease remission MTX - nonresponder   Review of Systems  Constitutional:  Positive for fatigue.  HENT:  Negative for mouth sores and mouth dryness.   Eyes:  Positive for dryness.  Respiratory:  Positive for shortness of breath.   Cardiovascular:  Positive for chest pain and palpitations.  Gastrointestinal:  Positive for blood in stool and constipation. Negative for diarrhea.  Endocrine: Positive for increased urination.  Genitourinary:  Negative for involuntary urination.  Musculoskeletal:  Positive for joint pain, gait problem, joint pain, joint swelling, myalgias, muscle weakness, morning stiffness, muscle tenderness and myalgias.  Skin:  Positive for rash  and sensitivity to sunlight. Negative for color change and hair loss.  Allergic/Immunologic: Positive for susceptible to infections.  Neurological:  Positive for dizziness and headaches.  Hematological:  Positive for swollen glands.  Psychiatric/Behavioral:  Positive for depressed mood and sleep disturbance. The patient is nervous/anxious.     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   Bipolar 1 disorder (HCC) 02/23/2022   RA (rheumatoid arthritis) (HCC) 02/07/2022   Syncope 02/06/2022  ABLA (acute blood loss anemia) 02/06/2022   BRBPR (bright red blood per rectum) 02/06/2022   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   Depression 05/20/2016   Marijuana dependence (HCC) 05/19/2016    Past Medical History:  Diagnosis Date   Anemia    Bipolar 1 disorder (HCC)    Cancer (HCC)    squamous cell rectal cancer   Diabetes (HCC)    PREDIABETIC   ETOH abuse    H/O self-harm    H/O suicide attempt    x 5 - last 05/2016 - overdose Ibuprofen   Marijuana abuse    Rheumatoid arthritis (HCC)     Family History  Problem Relation Age of Onset   Lupus Mother    Hypertension Other    Past Surgical History:  Procedure Laterality Date   HEMORRHOID SURGERY N/A 02/08/2022   Procedure: EXTERNAL AND INTERNAL HEMORRHOIDECTOMY;  Surgeon: Violeta Gelinas, MD;  Location: [facility] OR;  Service: General;  Laterality:  N/A;   INCISION AND DRAINAGE ABSCESS Right 04/22/2022   Procedure: INCISION AND DRAINAGE axilla;  Surgeon: Sheliah Hatch, De Blanch, MD;  Location: [facility] ORS;  Service: General;  Laterality: Right;   RECTAL BIOPSY N/A 05/10/2023   Procedure: EXCISION OF LEFT PERIANAL NODULE;  Surgeon: Andria Meuse, MD;  Location: [facility] OR;  Service: General;  Laterality: N/A;   RECTAL EXAM UNDER ANESTHESIA N/A 07/21/2022   Procedure: RECTAL EXAM UNDER ANESTHESIA;  Surgeon: Andria Meuse, MD;  Location: [facility] OR;  Service: General;  Laterality: N/A;   RECTAL EXAM UNDER ANESTHESIA N/A 05/10/2023   Procedure: RECTAL EXAM UNDER ANESTHESIA;  Surgeon: Andria Meuse, MD;  Location: [facility] OR;  Service: General;  Laterality: N/A;  60   TRANSANAL HEMORRHOIDAL DEARTERIALIZATION N/A 07/21/2022   Procedure: ANAL CANAL BIOPSY;  Surgeon: Andria Meuse, MD;  Location: [facility] OR;  Service: General;  Laterality: N/A;   Social History   Social History Narrative   Not on file    There is no immunization history on file for this patient.   Objective: Vital Signs: BP (!) 154/103 (BP Location: Right Arm, Patient Position: Sitting, Cuff Size: Normal)   Pulse 81   Resp 16   Ht 6' (1.829 m)   Wt 282 lb (127.9 kg)   LMP 05/03/2022 (Exact Date) Comment: verified w/patient  BMI 38.25 kg/m    Physical Exam Constitutional:      Appearance: She is obese.  Eyes:     Conjunctiva/sclera: Conjunctivae normal.  Cardiovascular:     Rate and Rhythm: Normal rate and regular rhythm.  Pulmonary:     Effort: Pulmonary effort is normal.     Breath sounds: Normal breath sounds.  Lymphadenopathy:     Cervical: No cervical adenopathy.  Skin:    General: Skin is warm and dry.     Findings: Rash present.     Comments: Acne type rash on much of her back  Neurological:     Mental Status: She is alert.  Psychiatric:        Mood and Affect: Mood normal.      Musculoskeletal Exam:  Shoulders full ROM, pain with active  movement Bilateral elbow tenderness, left with palpable swelling and severely limited ROM with guarding Right wrist pain swelling and decreased ROM Fingers grip ROM about 80% normal, 2nd-3rd MCP swelling and tenderness Right knee swelling tenderness and decreased ROM  Investigation: No additional findings.  Imaging: No results found.  Recent Labs: Lab Results  Component Value  Date   WBC 6.0 05/10/2023   HGB 10.9 (L) 05/10/2023   PLT 374 05/10/2023   NA 136 05/10/2023   K 3.5 05/10/2023   CL 102 05/10/2023   CO2 23 05/10/2023   GLUCOSE 117 (H) 05/10/2023   BUN 7 05/10/2023   CREATININE 0.69 05/10/2023   BILITOT 0.3 05/10/2023   ALKPHOS 85 05/10/2023   AST 19 05/10/2023   ALT 34 05/10/2023   PROT 7.6 05/10/2023   ALBUMIN 3.2 (L) 05/10/2023   CALCIUM 9.1 05/10/2023   GFRAA >60 04/12/2018   QFTBGOLDPLUS NEGATIVE 12/25/2022    Speciality Comments: No specialty comments available.  Procedures:  No procedures performed Allergies: Coconut flavor   Assessment / Plan:     Visit Diagnoses: Rheumatoid arthritis involving multiple sites, unspecified whether rheumatoid factor present (HCC) - Plan: Sedimentation rate, C-reactive protein, leflunomide (ARAVA) 20 MG tablet  Rechecking sed rate and CRP for inflammatory activity monitoring now she is on leflunomide 20 mg.  Concerned about side effects with repeating such high dose prednisone and is not seeing a lot of clinical benefit on low-dose.  May need to continue her on low-dose pain medication for now until we can see more improvement or start additional treatment.  High risk medication use - Stopping Harriette Ohara due to comorbidities and associated risk factor.  Will start on leflunomide 20 mg daily.  Recently had blood count metabolic panel checked reviewed these were fine with no evidence of leukopenia or hepatotoxicity from [place].  Anal squamous cell carcinoma (HCC)  Will reach out to oncology provider currently she is off her  Jak inhibitor due to malignancy but would benefit greatly if we can resume a biologic DMARD without complicating her squamous cell cancer.  Chronic pain syndrome - Plan: DULoxetine (CYMBALTA) 60 MG capsule  Will increase Cymbalta to 60 mg daily with lack of response after several weeks on the 30 mg dose.  Still has pretty much allodynia on exam with widespread persistent pain.  Also taking low-dose oxycodone as needed after the biopsy which has helped somewhat.  Orders: Orders Placed This Encounter  Procedures   Sedimentation rate   C-reactive protein   Meds ordered this encounter  Medications   DULoxetine (CYMBALTA) 60 MG capsule    Sig: Take 1 capsule (60 mg total) by mouth at bedtime.    Dispense:  30 capsule    Refill:  2   leflunomide (ARAVA) 20 MG tablet    Sig: Take 1 tablet (20 mg total) by mouth at bedtime.    Dispense:  30 tablet    Refill:  2     Follow-Up Instructions: Return in about 2 months (around 07/12/2023) for RA/HS on LEF/GC f/u 2mos.   Fuller Plan, MD  Note - This record has been created using [employer].  Chart creation errors have been sought, but may not always  have been located. Such creation errors do not reflect on  the standard of medical care.

## 2023-05-02 ENCOUNTER — Other Ambulatory Visit: Payer: Self-pay | Admitting: Nurse Practitioner

## 2023-05-02 ENCOUNTER — Other Ambulatory Visit (HOSPITAL_COMMUNITY): Payer: Self-pay

## 2023-05-02 DIAGNOSIS — C210 Malignant neoplasm of anus, unspecified: Secondary | ICD-10-CM

## 2023-05-02 DIAGNOSIS — G893 Neoplasm related pain (acute) (chronic): Secondary | ICD-10-CM

## 2023-05-02 DIAGNOSIS — Z515 Encounter for palliative care: Secondary | ICD-10-CM

## 2023-05-02 DIAGNOSIS — M792 Neuralgia and neuritis, unspecified: Secondary | ICD-10-CM

## 2023-05-02 DIAGNOSIS — F32A Depression, unspecified: Secondary | ICD-10-CM

## 2023-05-02 DIAGNOSIS — R634 Abnormal weight loss: Secondary | ICD-10-CM

## 2023-05-02 DIAGNOSIS — R630 Anorexia: Secondary | ICD-10-CM

## 2023-05-03 ENCOUNTER — Encounter: Payer: Self-pay | Admitting: Hematology

## 2023-05-03 ENCOUNTER — Other Ambulatory Visit: Payer: Self-pay

## 2023-05-04 ENCOUNTER — Encounter: Payer: Self-pay | Admitting: Hematology

## 2023-05-05 NOTE — Progress Notes (Signed)
PCP - [facility]   PPM/ICD - denies Device Orders -  Rep Notified -   Chest x-ray - Chest CT 01-13-23 EKG - 11-29-22 Stress Test - denies ECHO - denies Cardiac Cath - denies  CPAP - denies  DM- denies  Blood Thinner Instructions: denies Aspirin Instructions: n/a  ERAS Protcol - Clear liquids until 11:30 a.m.  COVID TEST-   Anesthesia review: no  Patient verbally denies any shortness of breath, fever, cough and chest pain during phone call   -------------  SDW INSTRUCTIONS given:  Your procedure is scheduled on May 10, 2023.  Report to [facility] Main Entrance "A" at  1200 NOON., and check in at the Admitting office.  Call this number if you have problems the morning of surgery:  [phone]   Remember:  Do not eat after midnight the night before your surgery  You may drink clear liquids until 11:30 a.m. the morning of your surgery.   Clear liquids allowed are: Water, Non-Citrus Juices (without pulp), Carbonated Beverages, Clear Tea, Black Coffee Only, and Gatorade    Take these medicines the morning of surgery with A SIP OF WATER   IF NEEDED acetaminophen (TYLENOL)   As of today, STOP taking any Aspirin (unless otherwise instructed by your surgeon) Aleve, Naproxen, Ibuprofen, Motrin, Advil, Goody's, BC's, all herbal medications, fish oil, and all vitamins.                      Do not wear jewelry, make up, or nail polish            Do not wear lotions, powders, perfumes/colognes, or deodorant.            Do not shave 48 hours prior to surgery.  Men may shave face and neck.            Do not bring valuables to the hospital.            [facility] is not responsible for any belongings or valuables.  Do NOT Smoke (Tobacco/Vaping) 24 hours prior to your procedure If you use a CPAP at night, you may bring all equipment for your overnight stay.   Contacts, glasses, dentures or bridgework may not be worn into surgery.      For patients admitted to the  hospital, discharge time will be determined by your treatment team.   Patients discharged the day of surgery will not be allowed to drive home, and someone needs to stay with them for 24 hours.    Special instructions:   [facility]- Preparing For Surgery  Before surgery, you can play an important role. Because skin is not sterile, your skin needs to be as free of germs as possible. You can reduce the number of germs on your skin by washing with CHG (chlorahexidine gluconate) Soap before surgery.  CHG is an antiseptic cleaner which kills germs and bonds with the skin to continue killing germs even after washing.    Oral Hygiene is also important to reduce your risk of infection.  Remember - BRUSH YOUR TEETH THE MORNING OF SURGERY WITH YOUR REGULAR TOOTHPASTE  Please do not use if you have an allergy to CHG or antibacterial soaps. If your skin becomes reddened/irritated stop using the CHG.  Do not shave (including legs and underarms) for at least 48 hours prior to first CHG shower. It is OK to shave your face.  Please follow these instructions carefully.   Shower the [employer]  BEFORE SURGERY and the MORNING OF SURGERY with DIAL Soap.   Pat yourself dry with a CLEAN TOWEL.  Wear CLEAN PAJAMAS to bed the night before surgery  Place CLEAN SHEETS on your bed the night of your first shower and DO NOT SLEEP WITH PETS.   Day of Surgery: Please shower morning of surgery  Wear Clean/Comfortable clothing the morning of surgery Do not apply any deodorants/lotions.   Remember to brush your teeth WITH YOUR REGULAR TOOTHPASTE.   Questions were answered. Patient verbalized understanding of instructions.

## 2023-05-06 ENCOUNTER — Other Ambulatory Visit: Payer: Self-pay

## 2023-05-06 ENCOUNTER — Encounter (HOSPITAL_COMMUNITY): Payer: Self-pay | Admitting: Surgery

## 2023-05-07 ENCOUNTER — Other Ambulatory Visit (HOSPITAL_COMMUNITY): Payer: Self-pay

## 2023-05-07 ENCOUNTER — Ambulatory Visit: Payer: Self-pay | Admitting: Surgery

## 2023-05-07 ENCOUNTER — Other Ambulatory Visit: Payer: Self-pay

## 2023-05-07 ENCOUNTER — Encounter: Payer: Self-pay | Admitting: Hematology

## 2023-05-07 MED ORDER — METOPROLOL SUCCINATE ER 25 MG PO TB24
25.0000 mg | ORAL_TABLET | Freq: Every day | ORAL | 2 refills | Status: DC
  Filled 2023-05-07: qty 30, 30d supply, fill #0
  Filled 2023-05-28 – 2023-06-16 (×2): qty 30, 30d supply, fill #1

## 2023-05-07 MED ORDER — GABAPENTIN 100 MG PO CAPS
100.0000 mg | ORAL_CAPSULE | Freq: Two times a day (BID) | ORAL | 2 refills | Status: DC
  Filled 2023-05-07: qty 60, 30d supply, fill #0

## 2023-05-10 ENCOUNTER — Ambulatory Visit (HOSPITAL_COMMUNITY)
Admission: RE | Admit: 2023-05-10 | Discharge: 2023-05-10 | Disposition: A | Discharge status: Home / Self Care | Payer: Medicaid Other | Attending: Surgery | Admitting: Surgery

## 2023-05-10 ENCOUNTER — Encounter (HOSPITAL_COMMUNITY)
Admission: RE | Disposition: A | Discharge status: Home / Self Care | Payer: Self-pay | Source: Home / Self Care | Attending: Surgery

## 2023-05-10 ENCOUNTER — Other Ambulatory Visit (HOSPITAL_COMMUNITY): Payer: Self-pay

## 2023-05-10 ENCOUNTER — Ambulatory Visit (HOSPITAL_COMMUNITY): Payer: Medicaid Other | Admitting: Anesthesiology

## 2023-05-10 ENCOUNTER — Encounter (HOSPITAL_COMMUNITY): Payer: Self-pay | Admitting: Surgery

## 2023-05-10 DIAGNOSIS — Z9221 Personal history of antineoplastic chemotherapy: Secondary | ICD-10-CM | POA: Diagnosis not present

## 2023-05-10 DIAGNOSIS — Z923 Personal history of irradiation: Secondary | ICD-10-CM | POA: Insufficient documentation

## 2023-05-10 DIAGNOSIS — Z85048 Personal history of other malignant neoplasm of rectum, rectosigmoid junction, and anus: Secondary | ICD-10-CM | POA: Diagnosis not present

## 2023-05-10 DIAGNOSIS — Z87891 Personal history of nicotine dependence: Secondary | ICD-10-CM | POA: Diagnosis not present

## 2023-05-10 DIAGNOSIS — K644 Residual hemorrhoidal skin tags: Secondary | ICD-10-CM | POA: Diagnosis present

## 2023-05-10 DIAGNOSIS — K6289 Other specified diseases of anus and rectum: Secondary | ICD-10-CM | POA: Diagnosis not present

## 2023-05-10 HISTORY — PX: RECTAL EXAM UNDER ANESTHESIA: SHX6399

## 2023-05-10 HISTORY — PX: RECTAL BIOPSY: SHX2303

## 2023-05-10 LAB — COMPREHENSIVE METABOLIC PANEL
ALT: 34 U/L (ref 0–44)
AST: 19 U/L (ref 15–41)
Albumin: 3.2 g/dL — ABNORMAL LOW (ref 3.5–5.0)
Alkaline Phosphatase: 85 U/L (ref 38–126)
Anion gap: 11 (ref 5–15)
BUN: 7 mg/dL (ref 6–20)
CO2: 23 mmol/L (ref 22–32)
Calcium: 9.1 mg/dL (ref 8.9–10.3)
Chloride: 102 mmol/L (ref 98–111)
Creatinine, Ser: 0.69 mg/dL (ref 0.44–1.00)
GFR, Estimated: >60 mL/min (ref >60)
Glucose, Bld: 117 mg/dL — ABNORMAL HIGH (ref 70–99)
Potassium: 3.5 mmol/L (ref 3.5–5.1)
Sodium: 136 mmol/L (ref 135–145)
Total Bilirubin: 0.3 mg/dL (ref 0.3–1.2)
Total Protein: 7.6 g/dL (ref 6.5–8.1)

## 2023-05-10 LAB — CBC
HCT: 35.6 % — ABNORMAL LOW (ref 36.0–46.0)
Hemoglobin: 10.9 g/dL — ABNORMAL LOW (ref 12.0–15.0)
MCH: 26.7 pg (ref 26.0–34.0)
MCHC: 30.6 g/dL (ref 30.0–36.0)
MCV: 87.3 fL (ref 80.0–100.0)
Platelets: 374 10*3/uL (ref 150–400)
RBC: 4.08 MIL/uL (ref 3.87–5.11)
RDW: 17.8 % — ABNORMAL HIGH (ref 11.5–15.5)
WBC: 6 10*3/uL (ref 4.0–10.5)
nRBC: 0 % (ref 0.0–0.2)

## 2023-05-10 LAB — POCT PREGNANCY, URINE: Preg Test, Ur: NEGATIVE

## 2023-05-10 SURGERY — EXAM UNDER ANESTHESIA, RECTUM
Anesthesia: Monitor Anesthesia Care

## 2023-05-10 MED ORDER — OXYCODONE HCL 5 MG/5ML PO SOLN: 5.0000 mg | Freq: Once | ORAL | Status: DC | PRN

## 2023-05-10 MED ORDER — LIDOCAINE 2% (20 MG/ML) 5 ML SYRINGE
INTRAMUSCULAR | Status: AC
  Filled 2023-05-10: qty 5

## 2023-05-10 MED ORDER — BUPIVACAINE LIPOSOME 1.3 % IJ SUSP
INTRAMUSCULAR | Status: AC
  Filled 2023-05-10: qty 20

## 2023-05-10 MED ORDER — PROPOFOL 10 MG/ML IV BOLUS
INTRAVENOUS | Status: DC | PRN
  Administered 2023-05-10: 80 mg via INTRAVENOUS

## 2023-05-10 MED ORDER — FENTANYL CITRATE (PF) 100 MCG/2ML IJ SOLN: 25.0000 ug | INTRAMUSCULAR | Status: DC | PRN

## 2023-05-10 MED ORDER — ROCURONIUM BROMIDE 10 MG/ML (PF) SYRINGE
PREFILLED_SYRINGE | INTRAVENOUS | Status: AC
  Filled 2023-05-10: qty 10

## 2023-05-10 MED ORDER — MIDAZOLAM HCL 2 MG/2ML IJ SOLN
INTRAMUSCULAR | Status: DC | PRN
  Administered 2023-05-10: 2 mg via INTRAVENOUS

## 2023-05-10 MED ORDER — ACETAMINOPHEN 500 MG PO TABS
1000.0000 mg | ORAL_TABLET | Freq: Once | ORAL | Status: DC
Start: 2023-05-10 — End: 2023-05-10

## 2023-05-10 MED ORDER — ORAL CARE MOUTH RINSE: 15.0000 mL | Freq: Once | OROMUCOSAL | Status: AC

## 2023-05-10 MED ORDER — OXYCODONE HCL 5 MG PO TABS
5.0000 mg | ORAL_TABLET | Freq: Four times a day (QID) | ORAL | 0 refills | Status: AC | PRN
Start: 2023-05-10 — End: 2023-05-15
  Filled 2023-05-10 (×2): qty 10, 3d supply, fill #0

## 2023-05-10 MED ORDER — LIDOCAINE 2% (20 MG/ML) 5 ML SYRINGE
INTRAMUSCULAR | Status: DC | PRN
  Administered 2023-05-10: 40 mg via INTRAVENOUS

## 2023-05-10 MED ORDER — MIDAZOLAM HCL 2 MG/2ML IJ SOLN
INTRAMUSCULAR | Status: AC
  Filled 2023-05-10: qty 2

## 2023-05-10 MED ORDER — FENTANYL CITRATE (PF) 250 MCG/5ML IJ SOLN
INTRAMUSCULAR | Status: AC
  Filled 2023-05-10: qty 5

## 2023-05-10 MED ORDER — OXYCODONE HCL 5 MG PO TABS
ORAL_TABLET | ORAL | Status: AC
  Filled 2023-05-10: qty 1

## 2023-05-10 MED ORDER — ACETAMINOPHEN 500 MG PO TABS
1000.0000 mg | ORAL_TABLET | ORAL | Status: AC
  Administered 2023-05-10: 1000 mg via ORAL
  Filled 2023-05-10: qty 2

## 2023-05-10 MED ORDER — PROPOFOL 10 MG/ML IV BOLUS
INTRAVENOUS | Status: AC
  Filled 2023-05-10: qty 20

## 2023-05-10 MED ORDER — DEXAMETHASONE SODIUM PHOSPHATE 10 MG/ML IJ SOLN
INTRAMUSCULAR | Status: AC
  Filled 2023-05-10: qty 1

## 2023-05-10 MED ORDER — BUPIVACAINE LIPOSOME 1.3 % IJ SUSP: 20.0000 mL | Freq: Once | INTRAMUSCULAR | Status: DC

## 2023-05-10 MED ORDER — DROPERIDOL 2.5 MG/ML IJ SOLN: 0.6250 mg | Freq: Once | INTRAMUSCULAR | Status: DC | PRN

## 2023-05-10 MED ORDER — BUPIVACAINE-EPINEPHRINE (PF) 0.25% -1:200000 IJ SOLN
INTRAMUSCULAR | Status: DC | PRN
  Administered 2023-05-10: 30 mL via SURGICAL_CAVITY

## 2023-05-10 MED ORDER — PROPOFOL 500 MG/50ML IV EMUL
INTRAVENOUS | Status: DC | PRN
  Administered 2023-05-10: 75 ug/kg/min via INTRAVENOUS

## 2023-05-10 MED ORDER — BUPIVACAINE-EPINEPHRINE (PF) 0.25% -1:200000 IJ SOLN
INTRAMUSCULAR | Status: AC
  Filled 2023-05-10: qty 30

## 2023-05-10 MED ORDER — ONDANSETRON HCL 4 MG/2ML IJ SOLN
INTRAMUSCULAR | Status: AC
  Filled 2023-05-10: qty 2

## 2023-05-10 MED ORDER — CHLORHEXIDINE GLUCONATE 0.12 % MT SOLN
15.0000 mL | Freq: Once | OROMUCOSAL | Status: AC
  Administered 2023-05-10: 15 mL via OROMUCOSAL
  Filled 2023-05-10: qty 15

## 2023-05-10 MED ORDER — LACTATED RINGERS IV SOLN: INTRAVENOUS | Status: DC

## 2023-05-10 MED ORDER — DEXAMETHASONE SODIUM PHOSPHATE 10 MG/ML IJ SOLN
INTRAMUSCULAR | Status: DC | PRN
  Administered 2023-05-10: 10 mg via INTRAVENOUS

## 2023-05-10 MED ORDER — FENTANYL CITRATE (PF) 250 MCG/5ML IJ SOLN
INTRAMUSCULAR | Status: DC | PRN
  Administered 2023-05-10: 50 ug via INTRAVENOUS
  Administered 2023-05-10: 100 ug via INTRAVENOUS
  Administered 2023-05-10 (×2): 50 ug via INTRAVENOUS

## 2023-05-10 MED ORDER — OXYCODONE HCL 5 MG PO TABS: 5.0000 mg | ORAL_TABLET | Freq: Once | ORAL | Status: DC | PRN

## 2023-05-10 MED ORDER — ONDANSETRON HCL 4 MG/2ML IJ SOLN
INTRAMUSCULAR | Status: DC | PRN
  Administered 2023-05-10: 4 mg via INTRAVENOUS

## 2023-05-10 SURGICAL SUPPLY — 32 items
BAG COUNTER SPONGE SURGICOUNT (BAG) ×1 IMPLANT
BAG SPNG CNTER NS LX DISP (BAG) ×1
BLADE SURG 15 STRL LF DISP TIS (BLADE) ×1 IMPLANT
BLADE SURG 15 STRL SS (BLADE) ×1
BRIEF MESH DISP LRG (UNDERPADS AND DIAPERS) ×1 IMPLANT
CANISTER SUCT 3000ML PPV (MISCELLANEOUS) ×1 IMPLANT
COVER SURGICAL LIGHT HANDLE (MISCELLANEOUS) ×1 IMPLANT
DISSECTOR SURG LIGASURE 21 (MISCELLANEOUS) IMPLANT
ELECT CAUTERY BLADE 6.4 (BLADE) ×1 IMPLANT
GAUZE PAD ABD 8X10 STRL (GAUZE/BANDAGES/DRESSINGS) ×1 IMPLANT
GAUZE SPONGE 4X4 12PLY STRL (GAUZE/BANDAGES/DRESSINGS) ×1 IMPLANT
GLOVE BIO SURGEON STRL SZ7.5 (GLOVE) ×1 IMPLANT
GLOVE INDICATOR 8.0 STRL GRN (GLOVE) ×1 IMPLANT
GOWN STRL REUS W/ TWL LRG LVL3 (GOWN DISPOSABLE) ×1 IMPLANT
GOWN STRL REUS W/ TWL XL LVL3 (GOWN DISPOSABLE) ×1 IMPLANT
GOWN STRL REUS W/TWL LRG LVL3 (GOWN DISPOSABLE) ×1
GOWN STRL REUS W/TWL XL LVL3 (GOWN DISPOSABLE) ×1
KIT BASIN OR (CUSTOM PROCEDURE TRAY) ×1 IMPLANT
KIT TURNOVER KIT B (KITS) ×1 IMPLANT
NDL HYPO 25GX1X1/2 BEV (NEEDLE) ×1 IMPLANT
NEEDLE HYPO 25GX1X1/2 BEV (NEEDLE) ×1
NS IRRIG 1000ML POUR BTL (IV SOLUTION) ×1 IMPLANT
PACK LITHOTOMY IV (CUSTOM PROCEDURE TRAY) ×1 IMPLANT
PAD ARMBOARD 7.5X6 YLW CONV (MISCELLANEOUS) ×1 IMPLANT
PENCIL BUTTON HOLSTER BLD 10FT (ELECTRODE) ×1 IMPLANT
SPONGE T-LAP 4X18 ~~LOC~~+RFID (SPONGE) ×1 IMPLANT
SURGILUBE 2OZ TUBE FLIPTOP (MISCELLANEOUS) ×1 IMPLANT
SUT CHROMIC 3 0 SH 27 (SUTURE) ×1 IMPLANT
SYR CONTROL 10ML LL (SYRINGE) ×1 IMPLANT
TOWEL GREEN STERILE (TOWEL DISPOSABLE) ×1 IMPLANT
TUBE CONNECTING 12X1/4 (SUCTIONS) ×1 IMPLANT
YANKAUER SUCT BULB TIP NO VENT (SUCTIONS) ×1 IMPLANT

## 2023-05-10 NOTE — Anesthesia Postprocedure Evaluation (Signed)
Anesthesia Post Note  Patient: Tammy Garrison  Procedure(s) Performed: RECTAL EXAM UNDER ANESTHESIA EXCISION OF LEFT PERIANAL NODULE     Patient location during evaluation: PACU Anesthesia Type: MAC Level of consciousness: awake and alert Pain management: pain level controlled Vital Signs Assessment: post-procedure vital signs reviewed and stable Respiratory status: spontaneous breathing, nonlabored ventilation and respiratory function stable Cardiovascular status: blood pressure returned to baseline Postop Assessment: no apparent nausea or vomiting Anesthetic complications: no   No notable events documented.  Last Vitals:  Vitals:   05/10/23 1515 05/10/23 1545  BP: 130/81 (!) 156/108  Pulse: 98 99  Resp: 17   Temp: 36.7 C 36.8 C  SpO2: 96% 97%    Last Pain:  Vitals:   05/10/23 1545  TempSrc:   PainSc: 6                  Shanda Howells

## 2023-05-10 NOTE — Discharge Instructions (Addendum)
ANORECTAL SURGERY: POST OP INSTRUCTIONS  DIET: Follow a light bland diet the first 24 hours after arrival home, such as soup, liquids, crackers, etc.  Be sure to include lots of fluids daily.  Avoid fast food or heavy meals as your are more likely to get nauseated.  Eat a low fat diet the next few days after surgery.   Some bleeding with bowel movements is expected for the first couple of days but this should stop in between bowel movements  Take your usually prescribed home medications unless otherwise directed. No foreign bodies per rectum for the next 3 months (enemas, etc)  PAIN CONTROL: It is helpful to take an over-the-counter pain medication regularly for the first few days/weeks.  Choose from the following that works best for you: Ibuprofen (Advil, etc) Three 200mg tabs every 6 hours as needed. Acetaminophen (Tylenol, etc) 500-650mg every 6 hours as needed NOTE: You may take both of these medications together - most patients find it most helpful when alternating between the two (i.e. Ibuprofen at 6am, tylenol at 9am, ibuprofen at 12pm ...) A  prescription for pain medication may have been prescribed for you at discharge.  Take your pain medication as prescribed.  If you are having problems/concerns with the prescription medicine, please call us for further advice.  Avoid getting constipated.  Between the surgery and the pain medications, it is common to experience some constipation.  Increasing fluid intake (64oz of water per day) and taking a fiber supplement (such as Metamucil, Citrucel, FiberCon) 1-2 times a day regularly will usually help prevent this problem from occurring.  Take Miralax (over the counter) 1-2x/day while taking a narcotic pain medication. If no bowel movement after 48hours, you may additionally take a laxative like a bottle of Milk of Magnesia which can be purchased over the counter. Avoid enemas.   Watch out for diarrhea.  If you have many loose bowel movements,  simplify your diet to bland foods.  Stop any stool softeners and decrease your fiber supplement. If this worsens or does not improve, please call us.  Wash / shower every day.  If you were discharged with a dressing, you may remove this the day after your surgery. You may shower normally, getting soap/water on your wound, particularly after bowel movements.  Soaking in a warm bath filled a couple inches ("Sitz bath") is a great way to clean the area after a bowel movement and many patients find it is a way to soothe the area.  ACTIVITIES as tolerated:   You may resume regular (light) daily activities beginning the next day--such as daily self-care, walking, climbing stairs--gradually increasing activities as tolerated.  If you can walk 30 minutes without difficulty, it is safe to try more intense activity such as jogging, treadmill, bicycling, low-impact aerobics, etc. Refrain from any heavy lifting or straining for the first 2 weeks after your procedure, particularly if your surgery was for hemorrhoids. Avoid activities that make your pain worse You may drive when you are no longer taking prescription pain medication, you can comfortably wear a seatbelt, and you can safely maneuver your car and apply brakes.  FOLLOW UP in our office Please call [facility] at [phone] to set up an appointment to see your surgeon in the office for a follow-up appointment approximately 2 weeks after your surgery. Make sure that you call for this appointment the day you arrive home to insure a convenient appointment time.  9. If you have disability or family leave forms   that need to be completed, you may have them completed by your primary care physician's office; for return to work instructions, please ask our office staff and they will be happy to assist you in obtaining this documentation   When to call us [phone]: Poor pain control Reactions / problems with new medications (rash/itching, etc)  Fever over  101.5 F (38.5 C) Inability to urinate Nausea/vomiting Worsening swelling or bruising Continued bleeding from incision. Increased pain, redness, or drainage from the incision  The clinic staff is available to answer your questions during regular business hours (8:30am-5pm).  Please don't hesitate to call and ask to speak to one of our nurses for clinical concerns.   A surgeon from [facility] is always on call at the hospitals   If you have a medical emergency, go to the nearest emergency room or call 911.   [facility] [address], [address], [place], [place]  [address] MAIN: [phone] FAX: [phone] [email or web address] 

## 2023-05-10 NOTE — Op Note (Signed)
05/10/2023  3:10 PM  PATIENT:  Tammy Garrison  32 y.o. female  Patient Care Team: [facility] as PCP - General (Internal Medicine) Rossie Muskrat, MD (Rheumatology) Ranae Pila, MD as Consulting Physician (Obstetrics and Gynecology) Malachy Mood, MD as Consulting Physician (Hematology and Oncology)  PRE-OPERATIVE DIAGNOSIS:  Personal history of anal cancer  POST-OPERATIVE DIAGNOSIS:  Same  PROCEDURE:   Excision of perianal lesion - left posterior Anorectal exam under anesthesia  SURGEON:  Surgeon(s): Andria Meuse, MD  ASSISTANT: OR staff   ANESTHESIA:   local and general  SPECIMEN: Left posterior perianal lesion  DISPOSITION OF SPECIMEN:  PATHOLOGY  COUNTS:  Sponge, needle, and instrument counts were reported correct x2 at conclusion.  EBL: 5 mL  PLAN OF CARE: Discharge to home after PACU  PATIENT DISPOSITION:  PACU - hemodynamically stable.  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  No palpable intra anal canal masses or distal rectal lesions.  Externally at the anal os, in the left posterior position, there is an approximate 1 x 1 cm nodule with normal-appearing skin overlying it.  Given her personal history of anal cancer, we did opt to excise this for pathologic purposes.  No other abnormal appearing lesions within the perianal area.  DESCRIPTION: The patient was identified in the preoperative holding area and taken to the OR where she was placed on the operating room table. SCDs were placed.  MAC anesthesia was induced without difficulty. The patient was then positioned in high lithotomy with Allen stirrups. Pressure points were then evaluated and padded.  She was then prepped and draped in usual sterile fashion.  A surgical timeout was performed indicating the correct patient, procedure, and positioning.  A perianal block was performed  using a dilute mixture of 0.25% Marcaine with epinephrine and Exparel.   After ascertaining that an appropriate level of anesthesia had been achieved, a well lubricated digital rectal exam was performed. This demonstrated no palpable intra anal canal or distal rectal masses/lesions.  At the anal os externally left posterior position, there is a approximate 1 x 1 cm area of heaped up nodular tissue which is somewhat firm.  The skin overlying it does appear normal.  It is unclear what the significance is of this..  A Hill-Ferguson anoscope was into the anal canal and circumferential inspection demonstrated healthy appearing anoderm.  She does have mild intrinsic anal stenosis but easily accommodates a lubricated digit or small anoscope.  The anal epithelium is normal in appearance.  There are no deep boring fissures.  There are no evident ulcerations or masses.  Attention is directed at the left posterior perineal lesion.  We are able to elevate this with an Adson forcep and excised that sharply.  On excision, there does appear to be a purple nodule within this and this may actually represent a thrombosed hemorrhoid it has more recently began to resolve.  This was however fully excised and submitted for pathology.  The area was then irrigated.  Hemostasis is achieved electrocautery.  The excision site is then approximated using 3-0 chromic sutures.  The skin is washed and dried.  All sponge, needle, and instrument counts were reported correct.  Additional local anesthetic was infiltrated at the excision site.  She was taken out of lithotomy position and a dressing consisting of 4 x 4's and mesh underwear was placed.  She was awakened from anesthesia  and transferred to a stretcher for transport to recovery in satisfactory condition.  DISPOSITION: PACU in satisfactory condition.

## 2023-05-10 NOTE — H&P (Signed)
CC: Here today for surgery  HPI: Tammy Garrison is an 32 y.o. female taken to OR by Dr. Janee Morn 02/08/22 - excision of anal mass , internal hemorrhoidectomy, EUA.  +HPV  PATH:  A. SOFT TISSUE, ANTERIOR MASS VS. HEMORRHOID, EXCISION:  Invasive squamous cell carcinoma, focally keratinizing and  well-differentiated.  Carcinoma is present at the margins of excision.  Results of p16 immunostain will be reported as an addendum   B. HEMORRHOID, INTERNAL, EXCISION:  Hemorrhoid without the overlying epithelium.  Separate fragment of benign squamous epithelium.  Negative for neoplasm.   No prior colonoscopy  CT PET 02/26/22: Intense FDG uptake localizing to anus. No signs of solid organ metastasis or FDG avid nodal metastases. Mild nonspecific tracer uptake in inguinal lymph nodes bilaterally. Felt to be potentially reactive given recent surgery Enlarged and FDG avid left inguinal lymph nodes, etiology indeterminate. Felt to be reactive versus metastatic. Subcutaneous nodule in the ventral chest wall just to the left of midline and anterior to the body of the sternum is FDG avid. Felt to be infectious or inflammatory cannot rule out malignancy. Enlarged FDG avid axillary lymph node.  Seeing Dr. Mosetta Putt and [place] with med/onc and rad/onc.   [id number]  Scheduled to undergo 'modified Nigro protocol' with chemoradiation. She actually reports improvement in her anal pain since having had surgery. Currently just has some soreness with bowel movements. Occasional bleeding. She is here today with her mother.  She was referred by Dr. Janee Morn to see me for specialty consultation given anal cancer.  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 Returns to see me for follow-up. Overall, feels much better relative to prior to treatment. No rectal bleeding anymore. She has regular bowel movements. She does take daily MiraLAX to keep her stool soft and avoid  constipation.  Following with med onc Dr. Mosetta Putt as well.   CT PET 07/08/22 1. There is persistent increased radiotracer uptake associated with the anus. The degree of uptake is similar to the previous exam. Cannot exclude residual FDG avid tumor. 2. No signs of solid organ or nodal metastasis within the chest, abdomen or pelvis. 3. Interval resolution of previous FDG avid left axillary lymph nodes. 4. Interval increase in radiotracer uptake associated with anterior mediastinal soft tissue. The configuration of the soft tissue is consistent with the thymus gland. 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 5. Interval development of multiple FDG avid cutaneous soft tissue nodules within the anterior left shoulder, ventral chest wall, and anteromedial left thigh. These are nonspecific and may be inflammatory or infectious in etiology. Metastatic disease is considered less favored. Correlation with physical exam findings is advised. 6. Enlarged uterus containing degenerating fibroids.  Seen in office 07/10/22 -unable to tolerate in office exam in the postradiation phase but was having pain and bleeding and therefore was recommended exam under anesthesia.  MRI Pelvis 07/12/22 1. No dominant anal mass identified. The patient refused rectal contrast, and therefore the anus is underdistended. 2. No pelvic adenopathy. 3. Fibroid uterus.  EUA/Biopsy 07/21/22 -found to have mild intrinsic anal canal stenosis which easily admitted to a lubricated digit. No palpable mass. Circumferential anoscopy demonstrated anterior midline anal fissuring that was shallow and likely acute/traumatic in nature. Healed posterior midline fissure. No masses. Anterior midline anal canal tissue was biopsied for confirmation purposes.  PATH: A. SOFT TISSUE, ANTERIOR MIDLINE ANAL, BIOPSY:  - Fragments of granulation tissue with acute inflammation  and   underlying dense fibrosis with atypical spindle cells consistent with  radiation effect.  - Negative for malignancy (history of invasive squamous cell carcinoma  with chemoradiation is noted).   She has been doing well. She is taking daily MiraLAX. With all this, her symptoms are better than we saw her in the office in December. Still has some bleeding with bowel movements. No masses protruding or any other noted concerns.  CT A/P 09/17/22 IMPRESSION: 1. No CT evidence for acute intra-abdominal or pelvic abnormality. 2. Enlarged fibroid uterus.  INTERVAL HX Has been following with medical oncology-Dr. Mosetta Putt.  CT PET 02/06/23 -  1. Persistent increased radiotracer uptake associated with the anus compatible with known anal cancer. 2. Hypermetabolic bilateral axillary, bilateral subpectoral, right supraclavicular, retroperitoneal and right iliac side chain lymph nodes concerning for nodal metastatic disease. 3. No scintigraphic evidence of hypermetabolic solid organ or osseous metastasis.  Was in the emergency room 03/14/2023 with blistering rash on the right labia. Reports she was diagnosed with a herpes outbreak.  CT A/P 03/14/23 1. Slightly decreased conspicuity of anal soft tissue thickening in keeping with known primary anal malignancy. 2. Asymmetric soft tissue thickening and stranding of the right medial labia majora, which may reflect cellulitis. No discrete underlying fluid collection. 3. Slightly decreased size of 0.9 cm right common iliac lymph node  Reports mild perianal discomfort that has been stable over the course of many months. Occasional bleeding with bowel movements-on TP. No evident masses that she can feel. Overall her symptoms are much improved relative to the time when she had anal cancer. Has now been taking MiraLAX twice daily consistently. One of the medications she is on for this herpes outbreak she reports was given her some diarrhea so she may back down on her  MiraLAX. Is still using tobacco containing products, vaping, have again discussed importance of cessation. We discussed the potential for nonhealing anal wounds in the setting of radiation and now using nicotine containing products.  Elected not to have an anorectal exam today in the office and has requested this be done under anesthesia.   She denies any changes in health or health history since we met in the office. No new medications/allergies. She states she is ready for surgery today.  Past Medical History:  Diagnosis Date   Anemia    Bipolar 1 disorder (HCC)    Cancer (HCC)    squamous cell rectal cancer   ETOH abuse    H/O self-harm    H/O suicide attempt    x 5 - last 05/2016 - overdose Ibuprofen   Marijuana abuse    Rheumatoid arthritis (HCC)     Past Surgical History:  Procedure Laterality Date   HEMORRHOID SURGERY N/A 02/08/2022   Procedure: EXTERNAL AND INTERNAL HEMORRHOIDECTOMY;  Surgeon: Violeta Gelinas, MD;  Location: [facility] OR;  Service: General;  Laterality: N/A;   INCISION AND DRAINAGE ABSCESS Right 04/22/2022   Procedure: INCISION AND DRAINAGE axilla;  Surgeon: Sheliah Hatch, De Blanch, MD;  Location: [facility] ORS;  Service: General;  Laterality: Right;   RECTAL EXAM UNDER ANESTHESIA N/A 07/21/2022   Procedure: RECTAL EXAM UNDER ANESTHESIA;  Surgeon: Andria Meuse, MD;  Location: [facility] OR;  Service: General;  Laterality: N/A;   TRANSANAL HEMORRHOIDAL DEARTERIALIZATION N/A 07/21/2022   Procedure: ANAL CANAL BIOPSY;  Surgeon: Andria Meuse, MD;  Location: [facility] OR;  Service: General;  Laterality: N/A;    Family History  Problem Relation Age of Onset   Lupus Mother  Hypertension Other     Social:  reports that she quit smoking about 4 years ago. Her smoking use included cigarettes. She started smoking about 11 years ago. She has a 1.8 pack-year smoking history. She has been exposed to tobacco smoke. She has never used smokeless tobacco. She reports that she does not  currently use alcohol. She reports that she does not currently use drugs after having used the following drugs: Marijuana.  Allergies:  Allergies  Allergen Reactions   Coconut Flavor Itching and Rash    Itchy throat    Medications: I have reviewed the patient's current medications.  No results found for this or any previous visit (from the past 48 hour(s)).  No results found.   PE Last menstrual period 05/03/2022. Constitutional: NAD; conversant Eyes: Moist conjunctiva; no lid lag; anicteric Lungs: Normal respiratory effort CV: RRR Psychiatric: Appropriate affect  No results found for this or any previous visit (from the past 48 hour(s)).  No results found.  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 Cipriano Bunker 04/20/2022  -Referral to [facility] (no prior colonoscopy) - still pending, she has not scheduled despite this being placed; educated on rational...   -EUA & Biopsy 07/21/22 - negative for any evidence of recurrence; noted fissure that was benign on bx. Symptoms pre-surgery were already much improved also lending to benign etiology  -She does not reliably tolerate an exam in the office for a anoscopy for surveillance of her anal cancer. Will therefore again plan for exam under anesthesia, biopsy of any suspicious areas.  -The planned procedure, material risks (including, but not limited to, pain, bleeding, infection, scarring, need for additional procedures, recurrence of anal cancer, pneumonia, heart attack, stroke) benefits and alternatives to surgery were discussed at length. The patient's questions were answered to her satisfaction, she voiced understanding and elected to proceed  with surgery.  -We discussed the importance of smoking cessation and avoiding all nicotine containing products moving forward including reducing her risk of any sort of cancer recurrence as well as allowing anal fissures to heal. We discussed continuing MiraLAX indefinitely. We discussed not spreading the buttocks when sitting on the commode. We discussed the importance of hydration-64 ounces of water per day. We will go ahead and prescribe topical nifedipine to be applied as prescribed for the next month.   Marin Olp, MD [facility]

## 2023-05-10 NOTE — Anesthesia Preprocedure Evaluation (Addendum)
Anesthesia Evaluation  Patient identified by MRN, date of birth, ID band Patient awake    Reviewed: Allergy & Precautions, NPO status , Patient's Chart, lab work & pertinent test results, reviewed documented beta blocker date and time   History of Anesthesia Complications Negative for: history of anesthetic complications  Airway Mallampati: II  TM Distance: >3 FB Neck ROM: Full    Dental no notable dental hx.    Pulmonary Patient abstained from smoking., former smoker   Pulmonary exam normal        Cardiovascular Pt. on home beta blockers Normal cardiovascular exam     Neuro/Psych    Depression Bipolar Disorder      GI/Hepatic ,,,(+)     substance abuse  marijuana useAnal ca   Endo/Other  negative endocrine ROS    Renal/GU negative Renal ROS  negative genitourinary   Musculoskeletal  (+) Arthritis , Rheumatoid disorders,    Abdominal   Peds  Hematology negative hematology ROS (+)   Anesthesia Other Findings Day of surgery medications reviewed with patient.  Reproductive/Obstetrics                             Anesthesia Physical Anesthesia Plan  ASA: 2  Anesthesia Plan: MAC   Post-op Pain Management: Tylenol PO (pre-op)*   Induction:   PONV Risk Score and Plan: 2 and Treatment may vary due to age or medical condition, Propofol infusion, Ondansetron and Midazolam  Airway Management Planned: Natural Airway and Simple Face Mask  Additional Equipment: None  Intra-op Plan:   Post-operative Plan:   Informed Consent: I have reviewed the patients History and Physical, chart, labs and discussed the procedure including the risks, benefits and alternatives for the proposed anesthesia with the patient or authorized representative who has indicated his/her understanding and acceptance.       Plan Discussed with: CRNA  Anesthesia Plan Comments:        Anesthesia Quick  Evaluation

## 2023-05-10 NOTE — Transfer of Care (Signed)
Immediate Anesthesia Transfer of Care Note  Patient: Tammy Garrison  Procedure(s) Performed: RECTAL EXAM UNDER ANESTHESIA EXCISION OF LEFT PERIANAL NODULE  Patient Location: PACU  Anesthesia Type:MAC  Level of Consciousness: awake and alert   Airway & Oxygen Therapy: Patient Spontanous Breathing and Patient connected to nasal cannula oxygen  Post-op Assessment: Report given to RN and Post -op Vital signs reviewed and stable  Post vital signs: Reviewed and stable  Last Vitals:  Vitals Value Taken Time  BP    Temp    Pulse    Resp    SpO2      Last Pain:  Vitals:   05/10/23 1215  TempSrc: Oral  PainSc:       Patients Stated Pain Goal: 3 (05/10/23 1209)  Complications: No notable events documented.

## 2023-05-11 ENCOUNTER — Encounter (HOSPITAL_COMMUNITY): Payer: Self-pay | Admitting: Surgery

## 2023-05-11 LAB — SURGICAL PATHOLOGY

## 2023-05-12 ENCOUNTER — Other Ambulatory Visit: Payer: Self-pay

## 2023-05-12 ENCOUNTER — Other Ambulatory Visit (HOSPITAL_COMMUNITY): Payer: Self-pay

## 2023-05-12 ENCOUNTER — Ambulatory Visit: Payer: Medicaid Other | Attending: Internal Medicine | Admitting: Internal Medicine

## 2023-05-12 ENCOUNTER — Encounter: Payer: Self-pay | Admitting: Internal Medicine

## 2023-05-12 VITALS — BP 154/103 | HR 81 | Resp 16 | Ht 72.0 in | Wt 282.0 lb

## 2023-05-12 DIAGNOSIS — G894 Chronic pain syndrome: Secondary | ICD-10-CM

## 2023-05-12 DIAGNOSIS — C210 Malignant neoplasm of anus, unspecified: Secondary | ICD-10-CM | POA: Diagnosis not present

## 2023-05-12 DIAGNOSIS — M069 Rheumatoid arthritis, unspecified: Secondary | ICD-10-CM | POA: Diagnosis not present

## 2023-05-12 DIAGNOSIS — Z79899 Other long term (current) drug therapy: Secondary | ICD-10-CM | POA: Diagnosis not present

## 2023-05-12 DIAGNOSIS — L732 Hidradenitis suppurativa: Secondary | ICD-10-CM

## 2023-05-12 MED ORDER — DULOXETINE HCL 60 MG PO CPEP
60.0000 mg | ORAL_CAPSULE | Freq: Every day | ORAL | 2 refills | Status: DC
Start: 2023-05-12 — End: 2023-09-14
  Filled 2023-05-12: qty 30, 30d supply, fill #0
  Filled 2023-06-16: qty 30, 30d supply, fill #1
  Filled 2023-09-01: qty 30, 30d supply, fill #2

## 2023-05-12 MED ORDER — LEFLUNOMIDE 20 MG PO TABS
20.0000 mg | ORAL_TABLET | Freq: Every day | ORAL | 2 refills | Status: DC
  Filled 2023-05-12 – 2023-05-28 (×2): qty 30, 30d supply, fill #0
  Filled 2023-06-25: qty 30, 30d supply, fill #1
  Filled 2023-07-22: qty 30, 30d supply, fill #2

## 2023-05-13 LAB — C-REACTIVE PROTEIN: CRP: 18.6 mg/L — ABNORMAL HIGH (ref <8.0)

## 2023-05-13 LAB — SEDIMENTATION RATE: Sed Rate: 94 mm/h — ABNORMAL HIGH (ref 0–20)

## 2023-05-14 ENCOUNTER — Other Ambulatory Visit: Payer: Self-pay

## 2023-05-14 ENCOUNTER — Other Ambulatory Visit (HOSPITAL_COMMUNITY): Payer: Self-pay

## 2023-05-14 MED ORDER — VITAMIN D (ERGOCALCIFEROL) 50000 UNITS PO CAPS
1.0000 | ORAL_CAPSULE | ORAL | 5 refills | Status: DC
  Filled 2023-05-14: qty 4, 28d supply, fill #0
  Filled 2023-06-16: qty 4, 28d supply, fill #1
  Filled 2023-07-22 (×2): qty 4, 28d supply, fill #2
  Filled 2023-09-01 (×3): qty 4, 28d supply, fill #3
  Filled 2023-09-24 (×2): qty 4, 28d supply, fill #4
  Filled 2023-10-25 – 2023-10-26 (×2): qty 4, 28d supply, fill #5

## 2023-05-14 MED ORDER — NITROFURANTOIN MONOHYD MACRO 100 MG PO CAPS
100.0000 mg | ORAL_CAPSULE | Freq: Two times a day (BID) | ORAL | 0 refills | Status: DC
  Filled 2023-05-14: qty 14, 7d supply, fill #0

## 2023-05-18 ENCOUNTER — Telehealth: Payer: Self-pay

## 2023-05-18 NOTE — Telephone Encounter (Signed)
Contacted the patient and advised Dr. Dimple Casey completed the FMLA forms and we have faxed them. Tammy Garrison verbalized understanding.

## 2023-05-24 ENCOUNTER — Other Ambulatory Visit (HOSPITAL_COMMUNITY): Payer: Self-pay

## 2023-05-24 ENCOUNTER — Other Ambulatory Visit: Payer: Self-pay

## 2023-05-24 MED ORDER — GABAPENTIN 300 MG PO CAPS
300.0000 mg | ORAL_CAPSULE | Freq: Two times a day (BID) | ORAL | 2 refills | Status: DC
  Filled 2023-05-24: qty 60, 30d supply, fill #0
  Filled 2023-06-25: qty 60, 30d supply, fill #1
  Filled 2023-07-22: qty 60, 30d supply, fill #2

## 2023-05-29 ENCOUNTER — Other Ambulatory Visit (HOSPITAL_COMMUNITY): Payer: Self-pay

## 2023-05-31 ENCOUNTER — Other Ambulatory Visit: Payer: Self-pay

## 2023-06-07 NOTE — Progress Notes (Unsigned)
06/08/2023 Tammy Garrison [id number] 1991-06-17  Referring provider: Pa, [facility] Primary GI doctor: Dr. Leonides Schanz  ASSESSMENT AND PLAN:   Anemia Recent labs showed patient is anemic. -Check labs to further evaluate cause of anemia.  - consider repeat EGD/colon if iron def  Post-Rectal Cancer Treatment Patient has a history of rectal cancer treated with chemotherapy, radiation, and surgery. Currently experiencing abdominal pain, nausea, vomiting, and rectal pain. Recent nodule excision showed thrombocytoid with no evidence of cancer. -Schedule colonoscopy and endoscopy with Dr. Leonides Schanz to further evaluate symptoms and rule out radiation proctitis or other complications.Risk of bowel prep, conscious sedation, and EGD and colonoscopy were discussed.  Risks include but are not limited to dehydration, pain, bleeding, cardiopulmonary process, bowel perforation, or other possible adverse outcomes..  Treatment plan was discussed with patient, and agreed upon. - continue miralax, keep stool soft -Continue with pelvic floor physical therapy. -Use lidocaine cream as needed for rectal pain. - dicyclomine as needed for pain  Gastroesophageal Reflux Disease (GERD) with Ab pain, nausea, vomiting with dysphagia Patient reports constant heartburn, not relieved by Tums. Difficulty swallowing pills reported. -Increase pantoprazole (Protonix) to twice daily. -Discontinue use of Goody Powders due to risk of GI bleed and ulcer formation.  Rheumatoid Arthritis Patient reports ongoing pain and swelling, not relieved by current medication. -Continue current medication regimen, follow up with RA -Advise discontinuation of Goody Powders due to risk of GI complications   Patient Care Team: Pa, [facility] as PCP - General (Internal Medicine) Rossie Muskrat, MD (Rheumatology) Ranae Pila, MD as Consulting Physician (Obstetrics and Gynecology) Malachy Mood, MD as Consulting  Physician (Hematology and Oncology)  HISTORY OF PRESENT ILLNESS: 32 y.o. 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 Mosetta Putt and others listed below presents for evaluation of colonoscopy.   02/08/2022 anal mass excision and internal hemorrhoidectomy with Dr. Janee Morn, path showed invasive squamous cell carcinoma, focally keratinizing and well-differentiated  carcinoma is present at the margins of excision 04/21/2022 status post chemoradiation of anal squamous cell carcinoma with Dr. Mosetta Putt EUA/Biopsy 07/21/22 -found to have mild intrinsic anal canal stenosis which easily admitted to a lubricated digit. No palpable mass. Circumferential anoscopy demonstrated anterior midline anal fissuring that was shallow and likely acute/traumatic in nature. Healed posterior midline fissure. No masses. Anterior midline anal canal tissue was biopsied for confirmation purpose  03/14/2023 CT AB and pelvis 1. Slightly decreased conspicuity of anal soft tissue thickening in keeping with known primary anal malignancy. 2. Asymmetric soft tissue thickening and stranding of the right medial labia majora, which may reflect cellulitis. No discrete underlying fluid collection. 3. Slightly decreased size of 0.9 cm right common iliac lymph node. 05/10/2023 rectal exam under anesthesia and excision of left perianal nodule with Dr. Cliffton Asters.  pathology report was consistent for thrombosed hemorrhoid.   Discussed the use of AI scribe software for clinical note transcription with the patient, who gave verbal consent to proceed.  The patient, with a history of rectal cancer, underwent a hemorrhoidectomy and mass excision in July of the previous year, followed by chemotherapy and radiation therapy, which concluded in September of the same year. She also  had a nodule excised in October, which showed thrombocytoid with no evidence of cancer. However, the patient reports persistent and significant gastrointestinal symptoms.  The patient experiences abdominal pain, which is exacerbated  by eating, regardless of the type of food consumed. The pain is described as being all over the abdomen and is constant. Accompanying the pain is a sense of urgency to defecate, which occurs after every meal. The patient describes the stool as sometimes being a jelly-like substance, and at other times, hard. Despite being on Miralax, the patient reports no improvement in stool consistency.  In addition to the abdominal pain and altered bowel habits, the patient experiences severe morning nausea, leading to vomiting of bile. She also reports constant heartburn, which occurs even without food intake. The patient has been taking Tums for the heartburn, but reports no relief. The patient also mentions difficulty swallowing pills, a symptom that has been present since she started taking her current medications.  The patient also reports rectal pain, which she rates as a 7 on a scale of 1 to 10. The pain is associated with a sense of pressure and the need to defecate, even without food intake. The patient also reports rectal bleeding, which can sometimes be significant. The patient has been using lidocaine cream for the rectal pain, but reports no relief.  The patient also has rheumatoid arthritis, which is not well-controlled, and she reports persistent swelling and pain in the wrist and left leg. The patient has been taking Goody powders for the arthritis pain, but reports no relief. The patient also reports significant weight loss and anemia since her cancer diagnosis.  The patient's symptoms have significantly impacted her quality of life, leading to fear of eating due to the associated pain and urgency to defecate. The patient also reports significant anxiety and sleep  disturbances due to her symptoms.   She  reports that she quit smoking about 4 years ago. Her smoking use included cigarettes. She started smoking about 11 years ago. She has a 1.8 pack-year smoking history. She has been exposed to tobacco smoke. She has never used smokeless tobacco. She reports that she does not currently use alcohol. She reports that she does not currently use drugs after having used the following drugs: Marijuana.  Wt Readings from Last 5 Encounters:  06/08/23 276 lb (125.2 kg)  05/12/23 282 lb (127.9 kg)  05/10/23 277 lb (125.6 kg)  04/07/23 290 lb (131.5 kg)  03/22/23 290 lb (131.5 kg)     RELEVANT LABS AND IMAGING:  Results   LABS Inflammatory markers: Elevated Hb: Low, indicative of anemia  PATHOLOGY Pathology report: Thrombocytoid, no evidence of malignancy (05/2023)      CBC    Component Value Date/Time   WBC 6.0 05/10/2023 1222   RBC 4.08 05/10/2023 1222   HGB 10.9 (L) 05/10/2023 1222   HGB 10.8 (L) 03/30/2022 0943   HCT 35.6 (L) 05/10/2023 1222   HCT 30.0 (L) 03/20/2019 1613   PLT 374 05/10/2023 1222   PLT 155 03/30/2022 0943   MCV 87.3 05/10/2023 1222   MCH 26.7 05/10/2023 1222   MCHC 30.6 05/10/2023 1222   RDW 17.8 (H) 05/10/2023 1222   LYMPHSABS 1.2 03/22/2023 1210   MONOABS 0.5 03/22/2023 1210   EOSABS 0.1 03/22/2023 1210   BASOSABS 0.0 03/22/2023 1210   Recent Labs    10/27/22 0143 11/29/22 0906 11/29/22 1529 11/30/22 0656 12/25/22 0903 01/11/23 0939 02/24/23 0820 03/14/23 1255 03/22/23 1210 05/10/23 1222  HGB 10.8* 10.0* 10.1* 9.7* 11.5* 10.7* 10.4* 11.1* 11.5* 10.9*    CMP     Component Value Date/Time   NA 136 05/10/2023 1222   K 3.5 05/10/2023 1222  CL 102 05/10/2023 1222   CO2 23 05/10/2023 1222   GLUCOSE 117 (H) 05/10/2023 1222   BUN 7 05/10/2023 1222   CREATININE 0.69 05/10/2023 1222   CREATININE 0.60 02/24/2023 0820   CALCIUM 9.1 05/10/2023 1222   PROT 7.6 05/10/2023 1222   ALBUMIN 3.2 (L) 05/10/2023  1222   AST 19 05/10/2023 1222   AST 26 03/30/2022 0943   ALT 34 05/10/2023 1222   ALT 42 03/30/2022 0943   ALKPHOS 85 05/10/2023 1222   BILITOT 0.3 05/10/2023 1222   BILITOT 0.6 03/30/2022 0943   GFRNONAA >60 05/10/2023 1222   GFRNONAA >60 03/30/2022 0943   GFRAA >60 04/12/2018 1520      Latest Ref Rng & Units 05/10/2023   12:22 PM 03/14/2023   12:55 PM 02/24/2023    8:20 AM  Hepatic Function  Total Protein 6.5 - 8.1 g/dL 7.6  7.9  8.1   Albumin 3.5 - 5.0 g/dL 3.2  3.5    AST 15 - 41 U/L 19  15  13    ALT 0 - 44 U/L 34  34  14   Alk Phosphatase 38 - 126 U/L 85  76    Total Bilirubin 0.3 - 1.2 mg/dL 0.3  0.6  0.2       Current Medications:     Current Outpatient Medications (Cardiovascular):    metoprolol succinate (TOPROL-XL) 25 MG 24 hr tablet, Take 1 tablet (25 mg total) by mouth daily.   metoprolol tartrate (LOPRESSOR) 25 MG tablet, Take 25 mg by mouth daily.     Current Outpatient Medications (Analgesics):    acetaminophen (TYLENOL) 325 MG tablet, Take 2 tablets (650 mg total) by mouth every 6 (six) hours as needed for mild pain. (Patient taking differently: Take 500-1,500 mg by mouth every 6 (six) hours as needed for mild pain (pain score 1-3).)   leflunomide (ARAVA) 20 MG tablet, Take 1 tablet (20 mg total) by mouth at bedtime.     Facility-Administered Medications Ordered in Other Visits (Hematological):    heparin lock flush 100 unit/mL  Current Outpatient Medications (Other):    dicyclomine (BENTYL) 20 MG tablet, Take 1 tablet (20 mg total) by mouth 3 (three) times daily as needed for spasms.   DULoxetine (CYMBALTA) 60 MG capsule, Take 1 capsule (60 mg total) by mouth at bedtime.   gabapentin (NEURONTIN) 300 MG capsule, Take 1 capsule (300 mg total) by mouth 2 (two) times daily.   mirtazapine (REMERON) 15 MG tablet, Take 1 tablet (15 mg total) by mouth at bedtime.   Na Sulfate-K Sulfate-Mg Sulf 17.5-3.13-1.6 GM/177ML SOLN, Take 1 kit by mouth once for 1  dose.   polyethylene glycol (MIRALAX / GLYCOLAX) 17 g packet, Take 17 g by mouth daily as needed for mild constipation.   Vitamin D, Ergocalciferol, 50000 units CAPS, Take 1 capsule by mouth every 7 (seven) days.   pantoprazole (PROTONIX) 40 MG tablet, Take 1 tablet (40 mg total) by mouth 2 (two) times daily before a meal.   Facility-Administered Medications Ordered in Other Visits (Other):    0.9 %  sodium chloride infusion (Manually program via Guardrails IV Fluids)   sodium chloride flush (NS) 0.9 % injection 10 mL   sodium chloride flush (NS) 0.9 % injection 10 mL   sodium chloride flush (NS) 0.9 % injection 10 mL No current facility-administered medications for this visit.  Medical History:  Past Medical History:  Diagnosis Date   Anemia    Bipolar 1 disorder (HCC)  Cancer (HCC)    squamous cell rectal cancer   Diabetes (HCC)    PREDIABETIC   ETOH abuse    H/O self-harm    H/O suicide attempt    x 5 - last 05/2016 - overdose Ibuprofen   Marijuana abuse    Rheumatoid arthritis (HCC)    Allergies:  Allergies  Allergen Reactions   Coconut Flavor Itching and Rash    Itchy throat     Surgical History:  She  has a past surgical history that includes Hemorrhoid surgery (N/A, 02/08/2022); Incision and drainage abscess (Right, 04/22/2022); Rectal exam under anesthesia (N/A, 07/21/2022); Transanal hemorrhoidal dearterialization (N/A, 07/21/2022); Rectal exam under anesthesia (N/A, 05/10/2023); and Rectal biopsy (N/A, 05/10/2023). Family History:  Her family history includes Diabetes in her father; Hypertension in an other family member; Lupus in her mother.  REVIEW OF SYSTEMS  : All other systems reviewed and negative except where noted in the History of Present Illness.  PHYSICAL EXAM: BP 128/80   Pulse (!) 128   Ht 6' (1.829 m)   Wt 276 lb (125.2 kg)   LMP 05/03/2022 (Exact Date) Comment: verified w/patient  BMI 37.43 kg/m  General Appearance: Obese AAF, appears  uncomfortable, in no apparent distress. Head:   Normocephalic and atraumatic. Eyes:  sclerae anicteric,conjunctive pink  Respiratory: Respiratory effort normal, BS equal bilaterally without rales, rhonchi, wheezing. Cardio: RRR with no MRGs. Peripheral pulses intact.  Abdomen: Soft,  Obese ,active bowel sounds. mild tenderness in the epigastrium and in the lower abdomen. Without guarding and Without rebound. No masses. Rectal: declines due to pain Musculoskeletal: Full ROM, Antalgic gait with cane, able to get on the table. Without edema. Skin:  Dry and intact without significant lesions or rashes Neuro: Alert and  oriented x4;  No focal deficits. Psych:  Cooperative. Normal mood and affect.    Doree Albee, PA-C 12:08 PM

## 2023-06-08 ENCOUNTER — Encounter: Payer: Self-pay | Admitting: Physician Assistant

## 2023-06-08 ENCOUNTER — Other Ambulatory Visit: Payer: Self-pay

## 2023-06-08 ENCOUNTER — Encounter: Payer: Self-pay | Admitting: Hematology

## 2023-06-08 ENCOUNTER — Other Ambulatory Visit (INDEPENDENT_AMBULATORY_CARE_PROVIDER_SITE_OTHER): Payer: Medicaid Other

## 2023-06-08 ENCOUNTER — Ambulatory Visit (INDEPENDENT_AMBULATORY_CARE_PROVIDER_SITE_OTHER): Payer: Medicaid Other | Admitting: Physician Assistant

## 2023-06-08 ENCOUNTER — Other Ambulatory Visit (HOSPITAL_COMMUNITY): Payer: Self-pay

## 2023-06-08 VITALS — BP 128/80 | HR 128 | Ht 72.0 in | Wt 276.0 lb

## 2023-06-08 DIAGNOSIS — K625 Hemorrhage of anus and rectum: Secondary | ICD-10-CM | POA: Diagnosis not present

## 2023-06-08 DIAGNOSIS — C210 Malignant neoplasm of anus, unspecified: Secondary | ICD-10-CM

## 2023-06-08 DIAGNOSIS — R194 Change in bowel habit: Secondary | ICD-10-CM | POA: Diagnosis not present

## 2023-06-08 DIAGNOSIS — D649 Anemia, unspecified: Secondary | ICD-10-CM

## 2023-06-08 DIAGNOSIS — R112 Nausea with vomiting, unspecified: Secondary | ICD-10-CM | POA: Diagnosis not present

## 2023-06-08 DIAGNOSIS — K219 Gastro-esophageal reflux disease without esophagitis: Secondary | ICD-10-CM

## 2023-06-08 LAB — IBC + FERRITIN
Ferritin: 140.7 ng/mL (ref 10.0–291.0)
Iron: 43 ug/dL (ref 42–145)
Saturation Ratios: 11.5 % — ABNORMAL LOW (ref 20.0–50.0)
TIBC: 373.8 ug/dL (ref 250.0–450.0)
Transferrin: 267 mg/dL (ref 212.0–360.0)

## 2023-06-08 LAB — HIGH SENSITIVITY CRP: CRP, High Sensitivity: 44.07 mg/L — ABNORMAL HIGH (ref 0.000–5.000)

## 2023-06-08 LAB — VITAMIN B12: Vitamin B-12: 786 pg/mL (ref 211–911)

## 2023-06-08 LAB — SEDIMENTATION RATE: Sed Rate: >130 mm/h — ABNORMAL HIGH (ref 0–20)

## 2023-06-08 LAB — FOLATE: Folate: 8.5 ng/mL (ref >5.9)

## 2023-06-08 LAB — TSH: TSH: 0.27 u[IU]/mL — ABNORMAL LOW (ref 0.35–5.50)

## 2023-06-08 MED ORDER — DICYCLOMINE HCL 20 MG PO TABS
20.0000 mg | ORAL_TABLET | Freq: Three times a day (TID) | ORAL | 0 refills | Status: DC | PRN
  Filled 2023-06-08: qty 50, 17d supply, fill #0

## 2023-06-08 MED ORDER — NA SULFATE-K SULFATE-MG SULF 17.5-3.13-1.6 GM/177ML PO SOLN
1.0000 | Freq: Once | ORAL | 0 refills | Status: AC
Start: 2023-06-08 — End: 2023-06-10
  Filled 2023-06-08: qty 354, 1d supply, fill #0

## 2023-06-08 MED ORDER — PANTOPRAZOLE SODIUM 40 MG PO TBEC
40.0000 mg | DELAYED_RELEASE_TABLET | Freq: Two times a day (BID) | ORAL | 1 refills | Status: DC
  Filled 2023-06-08: qty 60, 30d supply, fill #0
  Filled 2023-09-24: qty 60, 30d supply, fill #1

## 2023-06-08 NOTE — Progress Notes (Signed)
I agree with the assessment and plan as outlined by Ms. Collier. 

## 2023-06-08 NOTE — Patient Instructions (Addendum)
Your provider has requested that you go to the basement level for lab work before leaving today. Press "B" on the elevator. The lab is located at the first door on the left as you exit the elevator.  Please take your proton pump inhibitor medication, protonix 40 mg TWICE a day  Please take this medication 30 minutes to 1 hour before meals- this makes it more effective.  STOP THE GOODY POWDERS Avoid spicy and acidic foods Avoid fatty foods Limit your intake of coffee, tea, alcohol, and carbonated drinks Work to maintain a healthy weight Keep the head of the bed elevated at least 3 inches with blocks or a wedge pillow if you are having any nighttime symptoms Stay upright for 2 hours after eating Avoid meals and snacks three to four hours before bedtime  Please do sitz baths- these can be found at the pharmacy. It is a Chief Operating Officer that is put in your toliet.   You have been scheduled for an endoscopy and colonoscopy. Please follow the written instructions given to you at your visit today.  Please pick up your prep supplies at the pharmacy within the next 1-3 days.  If you use inhalers (even only as needed), please bring them with you on the day of your procedure.  DO NOT TAKE 7 DAYS PRIOR TO TEST- Trulicity (dulaglutide) Ozempic, Wegovy (semaglutide) Mounjaro (tirzepatide) Bydureon Bcise (exanatide extended release)  DO NOT TAKE 1 DAY PRIOR TO YOUR TEST Rybelsus (semaglutide) Adlyxin (lixisenatide) Victoza (liraglutide) Byetta (exanatide) ___________________________________________________________________________    About Hemorrhoids  Hemorrhoids are swollen veins in the lower rectum and anus.  Also called piles, hemorrhoids are a common problem.  Hemorrhoids may be internal (inside the rectum) or external (around the anus).  Internal Hemorrhoids  Internal hemorrhoids are often painless, but they rarely cause bleeding.  The internal veins may stretch and fall down  (prolapse) through the anus to the outside of the body.  The veins may then become irritated and painful.  External Hemorrhoids  External hemorrhoids can be easily seen or felt around the anal opening.  They are under the skin around the anus.  When the swollen veins are scratched or broken by straining, rubbing or wiping they sometimes bleed.  How Hemorrhoids Occur  Veins in the rectum and around the anus tend to swell under pressure.  Hemorrhoids can result from increased pressure in the veins of your anus or rectum.  Some sources of pressure are:  Straining to have a bowel movement because of constipation Waiting too long to have a bowel movement Coughing and sneezing often Sitting for extended periods of time, including on the toilet Diarrhea Obesity Trauma or injury to the anus Some liver diseases Stress Family history of hemorrhoids Pregnancy  Pregnant women should try to avoid becoming constipated, because they are more likely to have hemorrhoids during pregnancy.  In the last trimester of pregnancy, the enlarged uterus may press on blood vessels and causes hemorrhoids.  In addition, the strain of childbirth sometimes causes hemorrhoids after the birth.  Symptoms of Hemorrhoids  Some symptoms of hemorrhoids include: Swelling and/or a tender lump around the anus Itching, mild burning and bleeding around the anus Painful bowel movements with or without constipation Bright red blood covering the stool, on toilet paper or in the toilet bowel.   Symptoms usually go away within a few days.  Always talk to your doctor about any bleeding to make sure it is not from some other causes.  Diagnosing and  Treating Hemorrhoids  Diagnosis is made by an examination by your healthcare provider.  Special test can be performed by your doctor.    Most cases of hemorrhoids can be treated with: High-fiber diet: Eat more high-fiber foods, which help prevent constipation.  Ask for more detailed  fiber information on types and sources of fiber from your healthcare provider. Fluids: Drink plenty of water.  This helps soften bowel movements so they are easier to pass. Sitz baths and cold packs: Sitting in lukewarm water two or three times a day for 15 minutes cleases the anal area and may relieve discomfort.  If the water is too hot, swelling around the anus will get worse.  Placing a cloth-covered ice pack on the anus for ten minutes four times a day can also help reduce selling.  Gently pushing a prolapsed hemorrhoid back inside after the bath or ice pack can be helpful. Medications: For mild discomfort, your healthcare provider may suggest over-the-counter pain medication or prescribe a cream or ointment for topical use.  The cream may contain witch hazel, zinc oxide or petroleum jelly.  Medicated suppositories are also a treatment option.  Always consult your doctor before applying medications or creams. Procedures and surgeries: There are also a number of procedures and surgeries to shrink or remove hemorrhoids in more serious cases.  Talk to your physician about these options.  You can often prevent hemorrhoids or keep them from becoming worse by maintaining a healthy lifestyle.  Eat a fiber-rich diet of fruits, vegetables and whole grains.  Also, drink plenty of water and exercise regularly.   2007, [employer] Doc.30

## 2023-06-17 ENCOUNTER — Other Ambulatory Visit: Payer: Self-pay

## 2023-06-17 ENCOUNTER — Other Ambulatory Visit (HOSPITAL_COMMUNITY): Payer: Self-pay

## 2023-06-22 ENCOUNTER — Other Ambulatory Visit (HOSPITAL_COMMUNITY): Payer: Self-pay

## 2023-06-25 ENCOUNTER — Other Ambulatory Visit: Payer: Self-pay

## 2023-06-28 ENCOUNTER — Other Ambulatory Visit (HOSPITAL_COMMUNITY): Payer: Self-pay

## 2023-06-28 ENCOUNTER — Encounter: Payer: Self-pay | Admitting: Hematology

## 2023-06-28 MED ORDER — DICLOFENAC SODIUM 75 MG PO TBEC
75.0000 mg | DELAYED_RELEASE_TABLET | Freq: Two times a day (BID) | ORAL | 2 refills | Status: DC | PRN
  Filled 2023-06-28: qty 60, 30d supply, fill #0
  Filled 2023-08-02: qty 60, 30d supply, fill #1
  Filled 2023-09-24: qty 60, 30d supply, fill #2

## 2023-06-28 MED ORDER — TIZANIDINE HCL 4 MG PO TABS
4.0000 mg | ORAL_TABLET | Freq: Two times a day (BID) | ORAL | 2 refills | Status: DC | PRN
  Filled 2023-06-28: qty 60, 30d supply, fill #0
  Filled 2023-08-02: qty 60, 30d supply, fill #1
  Filled 2023-09-24: qty 60, 30d supply, fill #2

## 2023-06-28 NOTE — Progress Notes (Signed)
Office Visit Note  Patient: Tammy Garrison             Date of Birth: 1990-08-24           MRN: [id number]             PCP: [facility] Referring: [facility] Visit Date: 07/12/2023   Subjective:  Follow-up  Discussed the use of AI scribe software for clinical note transcription with the patient, who gave verbal consent to proceed.  History of Present Illness   Tammy Garrison is a 32 y.o. 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 She reports persistent pain, particularly in the hands, which has limited their ability to perform tasks requiring manual dexterity. They also note improvement in their leg strength, as they are now able to ambulate without a cane. Despite this, they report persistent swelling in their left foot, which occasionally prevents them from wearing socks.  To manage the ongoing pain, their primary care physician prescribed tizanidine, which has helped with mobility but has not improved their hand function. The patient also reports numbness in their hands and pain in their wrists. They have been experiencing pain in their shoulders when they sleep on their sides and have therefore been trying to sleep on their back.  The patient also reports swelling and pain in their left foot, particularly when moving it side to side and up and down. They deny any pain in their toes.  In addition to these symptoms, the patient has been experiencing issues with their colon and is scheduled for a colonoscopy to investigate potential ulcers. They have been taking Tylenol and nsaids, most recently oral diclofenac for pain management, which they believe may have contributed to the potential ulcers. They deny any history of diverticulitis or blood clots.   Previous HPI 05/12/2023 Tammy Garrison is a 32 y.o. 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  After last visit on the prednisone  taper she felt a good improvement in symptoms while taking 60 mg and then 40 mg of prednisone but started to get more pain and swelling back at 20 mg and less.  So far not sure if the Cymbalta and leflunomide is making a big impact on her symptoms.  Still has pretty severe pain with any pressure and very limited in her activities.  Her friend is needing to help out up to an hour every day with basic daily activities.  She has not noticed a difference with starting the low-dose Cymbalta but no particular side effect.  She went for exam for nodule near the anus with biopsy pathology report was consistent for thrombosed hemorrhoid.   Previous HPI 04/07/2023 Tammy Garrison is a 32 y.o. 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  She developed an outbreak of painful vesicular rashes on gentle seen at the emergency department diagnosis for genital herpes outbreak with concern about this triggered by her Harriette Ohara and steroid treatment.  She took oral antibiotics course and valacyclovir with resolution of this outbreak.  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  When on the prednisone taper at 60 and 40 mg had significant improvement. Since decreasing to 20 mg and stopping she has swelling and severe pain  and stiffness again.  This interferes with her basic activities of daily living such as getting dressed and she is using a cane for support while walking.  Also currently feels some exacerbation of her anxiety and depressive symptoms off her mirtazapine for the past month has not been to get back in with a primary care office yet for maintenance medication refills.   Previous HPI 02/24/2023 Tammy Garrison is a 32 y.o. female here for follow up for seropositive RA on Xeljanz 11 mg daily and prednisone 10  mg daily.  She saw partial benefit when on the 30 mg prednisone but has really not been doing well at any point since her last visit.  Still with joint pain and swelling in multiple areas.  Has trouble standing up due to leg pains and cannot use her arms to push off easily due to her pain especially left elbow.  Over the interval she had follow-up scans with oncology including PET scan on July 5 this was concerning for hypermetabolic regional lymph nodes around the associated anal squamous cell cancer.  She was recommended for new scope and biopsy has not yet been established went over with him so far.  Currently symptoms completely prevent her from working and are limiting even for her ADLs requiring assistance to get back and forth to the bathroom and move around the house.   Previous HPI 12/25/22 Tammy Garrison is a 32 y.o. female here for evaluation and management of seropositive rheumatoid arthritis.  Originally diagnosed with onset of symptoms and 2019 and saw Dr. Kathi Ludwig for this.  She was initially treated with methotrexate which was ineffective subsequently treated with Harriette Ohara with a good improvement.  She discontinued treatment in 2021 due to cost being prohibitive also was in disease remission at the time.  She continued to do well off of any maintenance therapy until last year after diagnosis with anal carcinoma then treated with concurrent chemoradiation therapy and redeveloped significant joint inflammation in multiple areas.  Her treatments and symptoms that put her out of work most of the past year.  The past few months joint inflammation is getting worse has been seen at the hospital multiple times and treated with prolonged steroid tapers twice in February and April.  She see symptom relief when started on the high-dose steroids typically with some starting to return near the end of the taper and then gets a severe flare again within a few days of discontinuing medicine.  Currently she  is off any steroids for 4 days and has severe joint pain and swelling in multiple areas. She also has hidradenitis suppurativa with involvement of the axillary, intertriginous, and groin areas.  No current open cysts or drainage.  Does notice some hardened mildly tender area along the left lower breast.  She was never on prolonged antibiotics and does not see dermatology for this condition.  HS symptoms were also quiescent during the few years off treatment before her cancer last year. No history of frequent respiratory or urinary tract infections.  She has had problems with bleeding and blood clots associated with the anal cancer but not prior to this.  No shortness of breath or persistent cough.   DMARD Hx Harriette Ohara - stopped 2021 due to cost and disease remission MTX - nonresponder   Review of Systems  Constitutional:  Positive for fatigue.  HENT:  Positive for mouth dryness. Negative for mouth sores.   Eyes:  Positive for dryness.  Respiratory:  Positive for shortness  of breath.   Cardiovascular:  Positive for chest pain and palpitations.  Gastrointestinal:  Positive for blood in stool and diarrhea. Negative for constipation.  Endocrine: Negative for increased urination.  Genitourinary:  Positive for involuntary urination.  Musculoskeletal:  Positive for joint pain, gait problem, joint pain, joint swelling, myalgias, muscle weakness, morning stiffness, muscle tenderness and myalgias.  Skin:  Positive for sensitivity to sunlight. Negative for color change, rash and hair loss.  Allergic/Immunologic: Positive for susceptible to infections.  Neurological:  Positive for dizziness and headaches.  Hematological:  Negative for swollen glands.  Psychiatric/Behavioral:  Positive for depressed mood and sleep disturbance. The patient is nervous/anxious.     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   Bipolar 1 disorder (HCC) 02/23/2022   RA (rheumatoid arthritis) (HCC) 02/07/2022   Syncope 02/06/2022   ABLA (acute blood loss anemia) 02/06/2022   BRBPR (bright red blood per rectum) 02/06/2022   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   Depression 05/20/2016   Marijuana dependence (HCC) 05/19/2016    Past Medical History:  Diagnosis Date   Anemia    Bipolar 1 disorder (HCC)    Cancer (HCC)    squamous cell rectal cancer   Diabetes (HCC)    PREDIABETIC   ETOH abuse    H/O self-harm    H/O suicide attempt    x 5 - last 05/2016 - overdose Ibuprofen   Marijuana abuse    Rheumatoid arthritis (HCC)     Family History  Problem Relation Age of Onset   Lupus Mother    Diabetes Father    Hypertension Other    Past Surgical History:  Procedure Laterality Date   HEMORRHOID SURGERY N/A 02/08/2022   Procedure: EXTERNAL AND INTERNAL HEMORRHOIDECTOMY;  Surgeon: Violeta Gelinas, MD;  [facility];  Service: General;  Laterality: N/A;   INCISION AND DRAINAGE ABSCESS Right 04/22/2022   Procedure: INCISION AND DRAINAGE axilla;  Surgeon: Sheliah Hatch, De Blanch, MD;  Location: [facility] ORS;  Service: General;  Laterality: Right;   RECTAL BIOPSY N/A 05/10/2023   Procedure: EXCISION OF LEFT PERIANAL NODULE;  Surgeon: Andria Meuse, MD;  Location: [facility] OR;  Service: General;  Laterality: N/A;   RECTAL EXAM UNDER ANESTHESIA N/A 07/21/2022   Procedure: RECTAL EXAM UNDER ANESTHESIA;  Surgeon:  Andria Meuse, MD;  Location: [facility] OR;  Service: General;  Laterality: N/A;   RECTAL EXAM UNDER ANESTHESIA N/A 05/10/2023   Procedure: RECTAL EXAM UNDER ANESTHESIA;  Surgeon: Andria Meuse, MD;  Location: [facility] OR;  Service: General;  Laterality: N/A;  60   TRANSANAL HEMORRHOIDAL DEARTERIALIZATION N/A 07/21/2022   Procedure: ANAL CANAL BIOPSY;  Surgeon: Andria Meuse, MD;  Location: [facility] OR;  Service: General;  Laterality: N/A;   Social History   Social History Narrative   Not on file    There is no immunization history on file for this patient.   Objective: Vital Signs:  BP (!) 154/111 (BP Location: Right Arm, Patient Position: Sitting, Cuff Size: Normal)   Pulse (!) 114   Resp 14   Ht 6' (1.829 m)   Wt 273 lb (123.8 kg)   BMI 37.03 kg/m    Physical Exam Eyes:     Conjunctiva/sclera: Conjunctivae normal.  Cardiovascular:     Rate and Rhythm: Normal rate and regular rhythm.  Pulmonary:     Effort: Pulmonary effort is normal.     Breath sounds: Normal breath sounds.  Lymphadenopathy:     Cervical: No cervical adenopathy.  Skin:    General: Skin is warm and dry.  Neurological:     Mental Status: She is alert.  Psychiatric:        Mood and Affect: Mood normal.      Musculoskeletal Exam:  Shoulders full ROM, no palpable swelling Bilateral elbow tenderness, mild swelling present, right significantly limited flexion extension range of motion Right wrist pain swelling and decreased ROM, left side tenderness with better movement Fingers grip ROM limited bilateral hands 2nd-3rd MCP swelling and tenderness Right knee swelling tenderness and decreased ROM  Investigation: No additional findings.  Imaging: No results found.  Recent Labs: Lab Results  Component Value Date   WBC 6.0 05/10/2023   HGB 10.9 (L) 05/10/2023   PLT 374 05/10/2023   NA 136 05/10/2023   K 3.5 05/10/2023   CL 102 05/10/2023   CO2 23 05/10/2023   GLUCOSE 117 (H) 05/10/2023   BUN 7  05/10/2023   CREATININE 0.69 05/10/2023   BILITOT 0.3 05/10/2023   ALKPHOS 85 05/10/2023   AST 19 05/10/2023   ALT 34 05/10/2023   PROT 7.6 05/10/2023   ALBUMIN 3.2 (L) 05/10/2023   CALCIUM 9.1 05/10/2023   GFRAA >60 04/12/2018   QFTBGOLDPLUS NEGATIVE 12/25/2022    Speciality Comments: No specialty comments available.  Procedures:  No procedures performed Allergies: Coconut flavoring agent (non-screening)   Assessment / Plan:     Visit Diagnoses: Rheumatoid arthritis involving multiple sites, unspecified whether rheumatoid factor present (HCC) Rheumatoid Arthritis Persistent joint pain and functional impairment despite Leflunomide therapy. Noted improvement in ambulation but significant hand pain and swelling. Also, left foot swelling. Tizanidine provides some relief.  I think Actemra is nice best option over TNF or resuming Jak inhibitor due to history of anal squamous cell cancer status post chemoradiation. -Plan switch to Actemra 162 mg [place] q14days  -Continue leflunomide 20 mg daily for now.  High risk medication use Type 2 Diabetes No issues reported with Glimepiride.  No GI complaint or other specific side effects with leflunomide treatment so far.  No serious interval infection. -Checking CBC and CMP for medication monitoring on continued leflunomide also baseline anticipating start new biologic DMARD. -Reviewed risks of Actemra including injection site reactions, cytopenias, hepatotoxicity, hyperlipidemia, risk for infections and malignancy with long-term use.  History of Squamous Cell Cancer Recent surgery showed no evidence of squamous cell cancer. -Continue monitoring for recurrence.  Potential Gastric Ulcers Patient scheduled for endoscopy due to suspected ulcers possibly related to NSAID use. -Await results of endoscopy.    Orders: No orders of the defined types were placed in this encounter.  No orders of the defined types were placed in this  encounter.    Follow-Up Instructions: No follow-ups on file.   Fuller Plan, MD  Note - This record has been created using [employer].  Chart creation errors have been sought, but may not always  have been located. Such  creation errors do not reflect on  the standard of medical care.

## 2023-06-29 ENCOUNTER — Other Ambulatory Visit (HOSPITAL_COMMUNITY): Payer: Self-pay

## 2023-07-12 ENCOUNTER — Encounter: Payer: Self-pay | Admitting: Internal Medicine

## 2023-07-12 ENCOUNTER — Ambulatory Visit: Payer: Medicaid Other | Attending: Internal Medicine | Admitting: Internal Medicine

## 2023-07-12 ENCOUNTER — Telehealth: Payer: Self-pay | Admitting: Pharmacist

## 2023-07-12 VITALS — BP 163/119 | HR 108 | Resp 14 | Ht 72.0 in | Wt 273.0 lb

## 2023-07-12 DIAGNOSIS — C210 Malignant neoplasm of anus, unspecified: Secondary | ICD-10-CM | POA: Diagnosis not present

## 2023-07-12 DIAGNOSIS — M069 Rheumatoid arthritis, unspecified: Secondary | ICD-10-CM

## 2023-07-12 DIAGNOSIS — G894 Chronic pain syndrome: Secondary | ICD-10-CM

## 2023-07-12 DIAGNOSIS — Z79899 Other long term (current) drug therapy: Secondary | ICD-10-CM | POA: Diagnosis not present

## 2023-07-12 NOTE — Patient Instructions (Signed)
Tocilizumab Injection What is this medication? TOCILIZUMAB (TOE si LIZ ue mab) treats autoimmune conditions, such as arthritis. It works by slowing down an overactive immune system. It may also be used to treat severe COVID-19 in people who are hospitalized. It is a monoclonal antibody. This medicine may be used for other purposes; ask your health care provider or pharmacist if you have questions. COMMON BRAND NAME(S): Actemra, TOFIDENCE, [place] What should I tell my care team before I take this medication? They need to know if you have any of these conditions: Cancer Diabetes Heart disease History of or current hepatitis B infection High blood pressure High cholesterol Immune system problems Infection Liver disease Low blood counts, such as low white cell, platelet, or red cell counts Multiple sclerosis Recent or upcoming vaccine Stomach or intestine problems Stroke An unusual or allergic reaction to tocilizumab, other medications, foods, dyes, or preservatives Pregnant or trying to get pregnant Breast-feeding How should I use this medication? This medication is injected into a vein or under the skin. It may be given by your care team in a hospital or clinic setting. It may also be given at home. If you get this medication at home, you will be taught how to prepare and give it. Use it exactly as directed. Take it as directed on the prescription label. Keep taking it unless your care team tells you to stop. If you use a pen, be sure to take off the outer needle cover before using the dose. It is important that you put your used needles and syringes in a special sharps container. Do not put them in a trash can. If you do not have a sharps container, call your pharmacist or care team to get one. A special MedGuide will be given to you by the pharmacist with each prescription and refill. Be sure to read this information carefully each time. Talk to your care team about the use of this  medication in children. While it may be prescribed for children as young as 2 years for selected conditions, precautions do apply. Overdosage: If you think you have taken too much of this medicine contact a poison control center or emergency room at once. NOTE: This medicine is only for you. Do not share this medicine with others. What if I miss a dose? If you get this medication at the hospital or clinic: It is important not to miss your dose. Call your care team if you are unable to keep an appointment. If you give yourself this medication at home: If you miss a dose, take it as soon as you can. If it is almost time for your next dose, take only that dose. Do not take double or extra doses. Call your care team with questions. What may interact with this medication? Do not take this medication with any of the following: Live virus vaccines This medication may also interact with the following: Biologic medications, such as abatacept, adalimumab, anakinra, certolizumab, etanercept, golimumab, infliximab, rituximab, secukinumab, ustekinumab Certain medications for cholesterol, such as atorvastatin, lovastatin, simvastatin Cyclosporine Estrogen or progestin hormones Omeprazole Steroid medications, such as prednisone or cortisone Theophylline Vaccines Warfarin This list may not describe all possible interactions. Give your health care provider a list of all the medicines, herbs, non-prescription drugs, or dietary supplements you use. Also tell them if you smoke, drink alcohol, or use illegal drugs. Some items may interact with your medicine. What should I watch for while using this medication? Visit your care team for regular  checks on your progress. Your condition will be monitored carefully while you are receiving this medication. Tell your care team if your symptoms do not start to get better or if they get worse. You may need blood work done while taking this medication. You will be tested for  tuberculosis (TB) before you start this medication. If your care team prescribes any medication for TB, you should start taking the TB medication before starting this medication. Make sure to finish the full course of TB medication. This medication may increase your risk of getting an infection. Call your care team for advice if you get a fever, chills, sore throat, or other symptoms of a cold or flu. Do not treat yourself. Try to avoid being around people who are sick. Talk to your care team about your risk of cancer. You may be more at risk for certain types of cancers if you take this medication. What side effects may I notice from receiving this medication? Side effects that you should report to your care team as soon as possible: Allergic reactions--skin rash, itching, hives, swelling of the face, lips, tongue, or throat Infection--fever, chills, cough, sore throat, wounds that don't heal, pain or trouble when passing urine, general feeling of discomfort or being unwell Liver injury--right upper belly pain, loss of appetite, nausea, light-colored stool, dark yellow or brown urine, yellowing skin or eyes, unusual weakness or fatigue Stomach pain that is severe, does not go away, or gets worse Unusual bruising or bleeding Side effects that usually do not require medical attention (report to your care team if they continue or are bothersome): Dizziness Headache Increase in blood pressure Pain, redness, or irritation at injection site Runny or stuffy nose Sore throat Stomach pain This list may not describe all possible side effects. Call your doctor for medical advice about side effects. You may report side effects to FDA at 1-800-FDA-1088. Where should I keep my medication? Keep out of the reach of children and pets. You will be instructed on how to store this medication. Get rid of any unused medication after the expiration date on the label. To get rid of medications that are no longer  needed or have expired: Take the medication to a medication take-back program. Check with your pharmacy or law enforcement to find a location. If you cannot return the medication, ask your pharmacist or care team how to get rid of this medication safely. NOTE: This sheet is a summary. It may not cover all possible information. If you have questions about this medicine, talk to your doctor, pharmacist, or health care provider.  2024 Elsevier/Gold Standard (2021-10-29 00:00:00)

## 2023-07-12 NOTE — Progress Notes (Signed)
Pharmacy Note Subjective: Patient presents today to the [facility] for follow up office visit. Patient seen by the pharmacist for counseling on Actemra for rheumatoid arthritis and hidradenetis suppurativa. She has tried MTX in past (clinically ineffective). 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  History of hyperlipidemia: No  History of diverticulitis: No  Objective: CBC    Component Value Date/Time   WBC 6.0 05/10/2023 1222   RBC 4.08 05/10/2023 1222   HGB 10.9 (L) 05/10/2023 1222   HGB 10.8 (L) 03/30/2022 0943   HCT 35.6 (L) 05/10/2023 1222   HCT 30.0 (L) 03/20/2019 1613   PLT 374 05/10/2023 1222   PLT 155 03/30/2022 0943   MCV 87.3 05/10/2023 1222   MCH 26.7 05/10/2023 1222   MCHC 30.6 05/10/2023 1222   RDW 17.8 (H) 05/10/2023 1222   LYMPHSABS 1.2 03/22/2023 1210   MONOABS 0.5 03/22/2023 1210   EOSABS 0.1 03/22/2023 1210   BASOSABS 0.0 03/22/2023 1210    CMP     Component Value Date/Time   NA 136 05/10/2023 1222   K 3.5 05/10/2023 1222   CL 102 05/10/2023 1222   CO2 23 05/10/2023 1222   GLUCOSE 117 (H) 05/10/2023 1222   BUN 7 05/10/2023 1222   CREATININE 0.69 05/10/2023 1222   CREATININE 0.60 02/24/2023 0820   CALCIUM 9.1 05/10/2023 1222   PROT 7.6 05/10/2023 1222   ALBUMIN 3.2 (L) 05/10/2023 1222   AST 19 05/10/2023 1222   AST 26 03/30/2022 0943   ALT 34 05/10/2023 1222   ALT 42 03/30/2022 0943   ALKPHOS 85 05/10/2023 1222   BILITOT 0.3 05/10/2023 1222   BILITOT 0.6 03/30/2022 0943   GFRNONAA >60 05/10/2023 1222   GFRNONAA >60 03/30/2022 0943   GFRAA >60 04/12/2018 1520     Baseline Immunosuppressant Therapy Labs     Latest Ref Rng & Units 12/25/2022    9:03 AM  Quantiferon TB Gold  Quantiferon TB Gold Plus NEGATIVE NEGATIVE        Latest Ref Rng & Units 12/25/2022    9:03 AM  Hepatitis  Hep B Surface Ag  NON-REACTIVE NON-REACTIVE   Hep B IgM NON-REACTIVE NON-REACTIVE   Hep C Ab NON-REACTIVE NON-REACTIVE     Lab Results  Component Value Date   HIV Non Reactive 03/14/2023   HIV Non Reactive 02/07/2022          Latest Ref Rng & Units 05/10/2023   12:22 PM  Serum Protein Electrophoresis  Total Protein 6.5 - 8.1 g/dL 7.6     Lipid Panel Lab Results  Component Value Date   CHOL 149 02/24/2023   HDL 54 02/24/2023   LDLCALC 81 02/24/2023   TRIG 65 02/24/2023   CHOLHDL 2.8 02/24/2023     Chest x-ray: 01/12/23 - Cardiovascular: No significant vascular findings. Normal heart size. No pericardial effusion.  Assessment/Plan:  Counseled patient that Actemra is an IL-6 blocking agent.  Counseled patient on purpose, proper use, and adverse effects of Actemra.  Reviewed the most common adverse effects including infections, infusion reactions, bowel injury, and rarely cancer and conditions of the nervous system.  Reviewed risk of lipid abnormalities and elevated liver enzymes. Discussed that there is the possibility of an increased risk of malignancy but it is not well understood if this increased risk is due to the medication or the disease state. Counseled patient that Actemra should  be held prior to scheduled surgery for infections or new antibiotic use. Counseled patient that Actemra should be held prior to scheduled surgery. Recommend annual influenza, PCV 15 or PCV20 or Pneumovax 23, and Shingrix as indicated.  Reviewed the importance of regular labs while on Actemra therapy including the need for routine lipid panel.  Will recheck lipid panel after 4-8 weeks of starting Actemra, then every 6 months routinely. CBC and CMP will be monitored every 3 months. TB gold will be monitored annually. Standing orders placed.  Provided patient with medication education material and answered all questions.  Patient voiced understanding.  Patient consented to Actemra.  Will upload consent into the media tab.   Reviewed storage instructions of Actemra with patient.  Advised patient that initial Actemra injection must be given in the office.  Will apply for Actemra through patient's insurance for self-administration using auto-injector/prefilled syringe.  Patient dose will be 162 mg every 7 days based on weight >100 kg .  Prescription pending lab results and/or insurance approval.   Patient has history of anal squamous cell carcinoma and is not a candidate for TNF inhibitors or JAK inhibitors due to history of chemoradiation  [name], PharmD, MPH, BCPS, CPP Clinical Pharmacist (Rheumatology and Pulmonology)

## 2023-07-12 NOTE — Telephone Encounter (Signed)
Patient will be new start to Actemra SQ  Dose: 162mg  SQ every 7 days (BW>100kg)   She has tried MTX in past (clinically ineffective). 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

## 2023-07-13 ENCOUNTER — Encounter: Payer: Self-pay | Admitting: Hematology

## 2023-07-13 ENCOUNTER — Ambulatory Visit: Payer: Medicaid Other | Admitting: Internal Medicine

## 2023-07-13 ENCOUNTER — Other Ambulatory Visit (HOSPITAL_COMMUNITY): Payer: Self-pay

## 2023-07-13 ENCOUNTER — Encounter: Payer: Self-pay | Admitting: Internal Medicine

## 2023-07-13 VITALS — BP 163/103 | HR 78 | Temp 97.4°F | Resp 10 | Ht 72.0 in | Wt 276.0 lb

## 2023-07-13 DIAGNOSIS — K5730 Diverticulosis of large intestine without perforation or abscess without bleeding: Secondary | ICD-10-CM | POA: Diagnosis not present

## 2023-07-13 DIAGNOSIS — K6389 Other specified diseases of intestine: Secondary | ICD-10-CM | POA: Diagnosis not present

## 2023-07-13 DIAGNOSIS — K319 Disease of stomach and duodenum, unspecified: Secondary | ICD-10-CM

## 2023-07-13 DIAGNOSIS — R1310 Dysphagia, unspecified: Secondary | ICD-10-CM

## 2023-07-13 DIAGNOSIS — K5989 Other specified functional intestinal disorders: Secondary | ICD-10-CM

## 2023-07-13 DIAGNOSIS — K259 Gastric ulcer, unspecified as acute or chronic, without hemorrhage or perforation: Secondary | ICD-10-CM

## 2023-07-13 DIAGNOSIS — D122 Benign neoplasm of ascending colon: Secondary | ICD-10-CM

## 2023-07-13 DIAGNOSIS — K648 Other hemorrhoids: Secondary | ICD-10-CM | POA: Diagnosis not present

## 2023-07-13 DIAGNOSIS — K219 Gastro-esophageal reflux disease without esophagitis: Secondary | ICD-10-CM

## 2023-07-13 DIAGNOSIS — R194 Change in bowel habit: Secondary | ICD-10-CM

## 2023-07-13 DIAGNOSIS — D509 Iron deficiency anemia, unspecified: Secondary | ICD-10-CM

## 2023-07-13 DIAGNOSIS — R112 Nausea with vomiting, unspecified: Secondary | ICD-10-CM

## 2023-07-13 DIAGNOSIS — K2970 Gastritis, unspecified, without bleeding: Secondary | ICD-10-CM

## 2023-07-13 LAB — COMPLETE METABOLIC PANEL WITH GFR
AG Ratio: 1 (calc) (ref 1.0–2.5)
ALT: 17 U/L (ref 6–29)
AST: 16 U/L (ref 10–30)
Albumin: 3.8 g/dL (ref 3.6–5.1)
Alkaline phosphatase (APISO): 112 U/L (ref 31–125)
BUN: 9 mg/dL (ref 7–25)
CO2: 25 mmol/L (ref 20–32)
Calcium: 9.6 mg/dL (ref 8.6–10.2)
Chloride: 104 mmol/L (ref 98–110)
Creat: 0.57 mg/dL (ref 0.50–0.97)
Globulin: 3.9 g/dL — ABNORMAL HIGH (ref 1.9–3.7)
Glucose, Bld: 106 mg/dL — ABNORMAL HIGH (ref 65–99)
Potassium: 3.8 mmol/L (ref 3.5–5.3)
Sodium: 140 mmol/L (ref 135–146)
Total Bilirubin: 0.2 mg/dL (ref 0.2–1.2)
Total Protein: 7.7 g/dL (ref 6.1–8.1)
eGFR: 125 mL/min/{1.73_m2} (ref >=60)

## 2023-07-13 LAB — C-REACTIVE PROTEIN: CRP: 55.9 mg/L — ABNORMAL HIGH (ref <8.0)

## 2023-07-13 LAB — CBC WITH DIFFERENTIAL/PLATELET
Absolute Lymphocytes: 712 {cells}/uL — ABNORMAL LOW (ref 850–3900)
Absolute Monocytes: 643 {cells}/uL (ref 200–950)
Basophils Absolute: 19 {cells}/uL (ref 0–200)
Basophils Relative: 0.3 %
Eosinophils Absolute: 132 {cells}/uL (ref 15–500)
Eosinophils Relative: 2.1 %
HCT: 34.5 % — ABNORMAL LOW (ref 35.0–45.0)
Hemoglobin: 11.1 g/dL — ABNORMAL LOW (ref 11.7–15.5)
MCH: 27.1 pg (ref 27.0–33.0)
MCHC: 32.2 g/dL (ref 32.0–36.0)
MCV: 84.4 fL (ref 80.0–100.0)
MPV: 9.6 fL (ref 7.5–12.5)
Monocytes Relative: 10.2 %
Neutro Abs: 4794 {cells}/uL (ref 1500–7800)
Neutrophils Relative %: 76.1 %
Platelets: 447 10*3/uL — ABNORMAL HIGH (ref 140–400)
RBC: 4.09 10*6/uL (ref 3.80–5.10)
RDW: 14.5 % (ref 11.0–15.0)
Total Lymphocyte: 11.3 %
WBC: 6.3 10*3/uL (ref 3.8–10.8)

## 2023-07-13 LAB — SEDIMENTATION RATE: Sed Rate: 118 mm/h — ABNORMAL HIGH (ref 0–20)

## 2023-07-13 MED ORDER — SODIUM CHLORIDE 0.9 % IV SOLN: 500.0000 mL | INTRAVENOUS | Status: DC

## 2023-07-13 MED ORDER — HYDROCORTISONE (PERIANAL) 2.5 % EX CREA
1.0000 | TOPICAL_CREAM | Freq: Two times a day (BID) | CUTANEOUS | 1 refills | Status: DC
  Filled 2023-07-13: qty 30, 7d supply, fill #0
  Filled 2023-10-12: qty 30, 7d supply, fill #1

## 2023-07-13 MED ORDER — FAMOTIDINE 20 MG PO TABS
20.0000 mg | ORAL_TABLET | Freq: Two times a day (BID) | ORAL | 3 refills | Status: DC
  Filled 2023-07-13: qty 60, 30d supply, fill #0
  Filled 2023-08-02 – 2023-09-01 (×2): qty 60, 30d supply, fill #1

## 2023-07-13 NOTE — Op Note (Addendum)
[facility] Patient Name: Tammy Garrison Procedure Date: 07/13/2023 1:06 PM MRN: [id number] Endoscopist: Madelyn Brunner [place] , , [id number] Age: 32 Referring MD:  Date of Birth: February 09, 1991 Gender: Female Account #: [id number] Procedure:                Colonoscopy Indications:              Generalized abdominal pain, Rectal bleeding, Iron                            deficiency anemia Medicines:                Monitored Anesthesia Care Procedure:                Pre-Anesthesia Assessment:                           - Prior to the procedure, a History and Physical                            was performed, and patient medications and                            allergies were reviewed. The patient's tolerance of                            previous anesthesia was also reviewed. The risks                            and benefits of the procedure and the sedation                            options and risks were discussed with the patient.                            All questions were answered, and informed consent                            was obtained. Prior Anticoagulants: The patient has                            taken no anticoagulant or antiplatelet agents. ASA                            Grade Assessment: III - A patient with severe                            systemic disease. After reviewing the risks and                            benefits, the patient was deemed in satisfactory                            condition to undergo the procedure.  After obtaining informed consent, the colonoscope                            was passed under direct vision. Throughout the                            procedure, the patient's blood pressure, pulse, and                            oxygen saturations were monitored continuously. The                            [id number] Olympus Scope was introduced through the                            anus and advanced to the the  terminal ileum. The                            colonoscopy was performed without difficulty. The                            patient tolerated the procedure well. The quality                            of the bowel preparation was fair. The terminal                            ileum, ileocecal valve, appendiceal orifice, and                            rectum were photographed. Scope In: 1:42:34 PM Scope Out: 1:56:43 PM Scope Withdrawal Time: 0 hours 12 minutes 11 seconds  Total Procedure Duration: 0 hours 14 minutes 9 seconds  Findings:                 The terminal ileum appeared normal.                           A single diverticulum was found in the cecum.                           A 3 mm polyp was found in the ascending colon. The                            polyp was sessile. The polyp was removed with a                            cold snare. Resection and retrieval were complete.                           A localized area of mildly erythematous mucosa was                            found in the sigmoid colon.  This was biopsied with                            a cold forceps for histology.                           Non-bleeding internal hemorrhoids were found during                            retroflexion. Complications:            No immediate complications. Estimated Blood Loss:     Estimated blood loss was minimal. Impression:               - Preparation of the colon was fair.                           - The examined portion of the ileum was normal.                           - Diverticulosis in the cecum.                           - One 3 mm polyp in the ascending colon, removed                            with a cold snare. Resected and retrieved.                           - Erythematous mucosa in the sigmoid colon.                            Biopsied.                           - Non-bleeding internal hemorrhoids. Recommendation:           - Discharge patient to home (with escort).                            - Await pathology results.                           - Anusol HC cream BID for 7 days.                           - The findings and recommendations were discussed                            with the patient. Dr Particia Lather "[name]" [place],  07/13/2023 2:05:29 PM

## 2023-07-13 NOTE — Progress Notes (Signed)
To pacu, VSS. Report to [email or web address] 

## 2023-07-13 NOTE — Progress Notes (Signed)
Called to room to assist during endoscopic procedure.  Patient ID and intended procedure confirmed with present staff. Received instructions for my participation in the procedure from the performing physician.  

## 2023-07-13 NOTE — Patient Instructions (Addendum)
Thank you for letting us take care of you today! Please see handouts regarding Polyps, Gastritis, and Hemorrhoids. Famotidine 20mg  twice daily & Anusol cream has been called into your pharmacy. Avoid NSAIDs if possible. Return to GI clinic in 2-3 months- we will call you.   YOU HAD AN ENDOSCOPIC PROCEDURE TODAY AT THE [facility]:   Refer to the procedure report that was given to you for any specific questions about what was found during the examination.  If the procedure report does not answer your questions, please call your gastroenterologist to clarify.  If you requested that your care partner not be given the details of your procedure findings, then the procedure report has been included in a sealed envelope for you to review at your convenience later.  YOU SHOULD EXPECT: Some feelings of bloating in the abdomen. Passage of more gas than usual.  Walking can help get rid of the air that was put into your GI tract during the procedure and reduce the bloating. If you had a lower endoscopy (such as a colonoscopy or flexible sigmoidoscopy) you may notice spotting of blood in your stool or on the toilet paper. If you underwent a bowel prep for your procedure, you may not have a normal bowel movement for a few days.  Please Note:  You might notice some irritation and congestion in your nose or some drainage.  This is from the oxygen used during your procedure.  There is no need for concern and it should clear up in a day or so.  SYMPTOMS TO REPORT IMMEDIATELY:  Following lower endoscopy (colonoscopy or flexible sigmoidoscopy):  Excessive amounts of blood in the stool  Significant tenderness or worsening of abdominal pains  Swelling of the abdomen that is new, acute  Fever of 100F or higher  Following upper endoscopy (EGD)  Vomiting of blood or coffee ground material  New chest pain or pain under the shoulder blades  Painful or persistently difficult swallowing  New shortness of  breath  Fever of 100F or higher  Black, tarry-looking stools  For urgent or emergent issues, a gastroenterologist can be reached at any hour by calling [phone]. Do not use MyChart messaging for urgent concerns.    DIET:  We do recommend a small meal at first, but then you may proceed to your regular diet.  Drink plenty of fluids but you should avoid alcoholic beverages for 24 hours.  ACTIVITY:  You should plan to take it easy for the rest of today and you should NOT DRIVE or use heavy machinery until tomorrow (because of the sedation medicines used during the test).    FOLLOW UP: Our staff will call the number listed on your records the next business day following your procedure.  We will call around 7:15- 8:00 am to check on you and address any questions or concerns that you may have regarding the information given to you following your procedure. If we do not reach you, we will leave a message.     If any biopsies were taken you will be contacted by phone or by letter within the next 1-3 weeks.  Please call us at [phone] if you have not heard about the biopsies in 3 weeks.    SIGNATURES/CONFIDENTIALITY: You and/or your care partner have signed paperwork which will be entered into your electronic medical record.  These signatures attest to the fact that that the information above on your After Visit Summary has been reviewed and is  understood.  Full responsibility of the confidentiality of this discharge information lies with you and/or your care-partner.

## 2023-07-13 NOTE — Op Note (Signed)
[facility] Patient Name: Tammy Garrison Procedure Date: 07/13/2023 1:07 PM MRN: [id number] Endoscopist: Madelyn Brunner [place] , , [id number] Age: 32 Referring MD:  Date of Birth: Jan 06, 1991 Gender: Female Account #: [id number] Procedure:                Upper GI endoscopy Indications:              Iron deficiency anemia, Dysphagia, Heartburn Medicines:                Monitored Anesthesia Care Procedure:                Pre-Anesthesia Assessment:                           - Prior to the procedure, a History and Physical                            was performed, and patient medications and                            allergies were reviewed. The patient's tolerance of                            previous anesthesia was also reviewed. The risks                            and benefits of the procedure and the sedation                            options and risks were discussed with the patient.                            All questions were answered, and informed consent                            was obtained. Prior Anticoagulants: The patient has                            taken no anticoagulant or antiplatelet agents. ASA                            Grade Assessment: II - A patient with mild systemic                            disease. After reviewing the risks and benefits,                            the patient was deemed in satisfactory condition to                            undergo the procedure.                           After obtaining informed consent, the endoscope was  passed under direct vision. Throughout the                            procedure, the patient's blood pressure, pulse, and                            oxygen saturations were monitored continuously. The                            GIF [id number] #[id number] was introduced through the                            mouth, and advanced to the second part of duodenum.                             The upper GI endoscopy was accomplished without                            difficulty. The patient tolerated the procedure                            well. Scope In: Scope Out: Findings:                 The examined esophagus was normal. A guidewire was                            placed and the scope was withdrawn. Dilation was                            performed with a Savary dilator with mild                            resistance at 18 mm. Biopsies were taken with a                            cold forceps for histology.                           Localized inflammation characterized by congestion                            (edema), erosions and erythema was found in the                            gastric antrum. Biopsies were taken with a cold                            forceps for histology.                           The examined duodenum was normal. Biopsies for                            histology were taken with a  cold forceps for                            evaluation of celiac disease. Complications:            No immediate complications. Estimated Blood Loss:     Estimated blood loss was minimal. Impression:               - Normal esophagus. Dilated. Biopsied.                           - Gastritis. Biopsied.                           - Normal examined duodenum. Biopsied. Recommendation:           - Await pathology results.                           - Gastric erosions may be a source of your iron                            deficiency anemia.                           - Continue PPI BID                           - Add on famotidine 20 mg BID to see if this helps                            further with acid reflux.                           - Avoid NSAIDs if possible.                           - Return to GI clinic in 2-3 months.                           - Perform a colonoscopy today. Dr Particia Lather "[name]" [place],  07/13/2023 2:02:58 PM

## 2023-07-13 NOTE — Telephone Encounter (Signed)
Submitted a Prior Authorization request to [facility] for ACTEMRA SQ via CoverMyMeds. Will update once we receive a response.  Key: [id number]

## 2023-07-13 NOTE — Progress Notes (Signed)
GASTROENTEROLOGY PROCEDURE H&P NOTE   Primary Care Physician: Pa, [facility]    Reason for Procedure:   IDA, history of anal cancer s/p surgery/chemo/radiation, GERD, dysphagia, generalized ab pain  Plan:    EGD/colonoscopy  Patient is appropriate for endoscopic procedure(s) in the ambulatory ([facility]) setting.  The nature of the procedure, as well as the risks, benefits, and alternatives were carefully and thoroughly reviewed with the patient. Ample time for discussion and questions allowed. The patient understood, was satisfied, and agreed to proceed.     HPI: Tammy Garrison is a 32 y.o. female who presents for EGD/colonoscopy for evaluation of IDA, history of anal cancer, GERD, dysphagia, generalized ab pain .  Patient was most recently seen in the Gastroenterology Clinic on 06/08/23.  No interval change in medical history since that appointment. Please refer to that note for full details regarding GI history and clinical presentation.   Past Medical History:  Diagnosis Date   Anemia    Bipolar 1 disorder (HCC)    Cancer (HCC)    squamous cell rectal cancer   Diabetes (HCC)    PREDIABETIC   ETOH abuse    H/O self-harm    H/O suicide attempt    x 5 - last 05/2016 - overdose Ibuprofen   Marijuana abuse    Rheumatoid arthritis (HCC)     Past Surgical History:  Procedure Laterality Date   HEMORRHOID SURGERY N/A 02/08/2022   Procedure: EXTERNAL AND INTERNAL HEMORRHOIDECTOMY;  Surgeon: Violeta Gelinas, MD;  Location: [facility] OR;  Service: General;  Laterality: N/A;   INCISION AND DRAINAGE ABSCESS Right 04/22/2022   Procedure: INCISION AND DRAINAGE axilla;  Surgeon: Sheliah Hatch, De Blanch, MD;  Location: [facility] ORS;  Service: General;  Laterality: Right;   RECTAL BIOPSY N/A 05/10/2023   Procedure: EXCISION OF LEFT PERIANAL NODULE;  Surgeon: Andria Meuse, MD;  Location: [facility] OR;  Service: General;  Laterality: N/A;   RECTAL EXAM UNDER ANESTHESIA N/A 07/21/2022    Procedure: RECTAL EXAM UNDER ANESTHESIA;  Surgeon: Andria Meuse, MD;  Location: [facility] OR;  Service: General;  Laterality: N/A;   RECTAL EXAM UNDER ANESTHESIA N/A 05/10/2023   Procedure: RECTAL EXAM UNDER ANESTHESIA;  Surgeon: Andria Meuse, MD;  Location: [facility] OR;  Service: General;  Laterality: N/A;  60   TRANSANAL HEMORRHOIDAL DEARTERIALIZATION N/A 07/21/2022   Procedure: ANAL CANAL BIOPSY;  Surgeon: Andria Meuse, MD;  Location: [facility] OR;  Service: General;  Laterality: N/A;    Prior to Admission medications   Medication Sig Start Date End Date Taking? Authorizing Provider  acetaminophen (TYLENOL) 325 MG tablet Take 2 tablets (650 mg total) by mouth every 6 (six) hours as needed for mild pain. Patient taking differently: Take 500-1,500 mg by mouth every 6 (six) hours as needed for mild pain (pain score 1-3). 12/01/22   Standley Brooking, MD  diclofenac (VOLTAREN) 75 MG EC tablet Take 1 tablet (75 mg total) by mouth 2 (two) times daily as needed with food 06/28/23     dicyclomine (BENTYL) 20 MG tablet Take 1 tablet (20 mg total) by mouth 3 (three) times daily as needed for spasms. 06/08/23   Doree Albee, PA-C  DULoxetine (CYMBALTA) 60 MG capsule Take 1 capsule (60 mg total) by mouth at bedtime. 05/12/23   Rice, Jamesetta Orleans, MD  gabapentin (NEURONTIN) 300 MG capsule Take 1 capsule (300 mg total) by mouth 2 (two) times daily. 05/24/23     leflunomide (ARAVA) 20 MG tablet Take  1 tablet (20 mg total) by mouth at bedtime. 05/12/23   Rice, Jamesetta Orleans, MD  metoprolol succinate (TOPROL-XL) 25 MG 24 hr tablet Take 1 tablet (25 mg total) by mouth daily. 05/07/23     metoprolol tartrate (LOPRESSOR) 25 MG tablet Take 25 mg by mouth daily. Patient not taking: Reported on 07/12/2023    [provider]  mirtazapine (REMERON) 15 MG tablet Take 1 tablet (15 mg total) by mouth at bedtime. Patient not taking: Reported on 07/12/2023 12/11/22   Pickenpack-Cousar, Arty Baumgartner, NP   pantoprazole (PROTONIX) 40 MG tablet Take 1 tablet (40 mg total) by mouth 2 (two) times daily before a meal. 06/08/23 08/07/23  Doree Albee, PA-C  polyethylene glycol (MIRALAX / GLYCOLAX) 17 g packet Take 17 g by mouth daily as needed for mild constipation. Patient not taking: Reported on 07/12/2023 12/01/22   Standley Brooking, MD  tiZANidine (ZANAFLEX) 4 MG tablet Take 1 tablet (4 mg total) by mouth 2 (two) times daily as needed. 06/28/23     Vitamin D, Ergocalciferol, 50000 units CAPS Take 1 capsule by mouth every 7 (seven) days. 05/14/23       Current Outpatient Medications  Medication Sig Dispense Refill   acetaminophen (TYLENOL) 325 MG tablet Take 2 tablets (650 mg total) by mouth every 6 (six) hours as needed for mild pain. (Patient taking differently: Take 500-1,500 mg by mouth every 6 (six) hours as needed for mild pain (pain score 1-3).)     diclofenac (VOLTAREN) 75 MG EC tablet Take 1 tablet (75 mg total) by mouth 2 (two) times daily as needed with food 60 tablet 2   dicyclomine (BENTYL) 20 MG tablet Take 1 tablet (20 mg total) by mouth 3 (three) times daily as needed for spasms. 50 tablet 0   DULoxetine (CYMBALTA) 60 MG capsule Take 1 capsule (60 mg total) by mouth at bedtime. 30 capsule 2   gabapentin (NEURONTIN) 300 MG capsule Take 1 capsule (300 mg total) by mouth 2 (two) times daily. 60 capsule 2   leflunomide (ARAVA) 20 MG tablet Take 1 tablet (20 mg total) by mouth at bedtime. 30 tablet 2   metoprolol succinate (TOPROL-XL) 25 MG 24 hr tablet Take 1 tablet (25 mg total) by mouth daily. 30 tablet 2   metoprolol tartrate (LOPRESSOR) 25 MG tablet Take 25 mg by mouth daily. (Patient not taking: Reported on 07/12/2023)     mirtazapine (REMERON) 15 MG tablet Take 1 tablet (15 mg total) by mouth at bedtime. (Patient not taking: Reported on 07/12/2023) 30 tablet 2   pantoprazole (PROTONIX) 40 MG tablet Take 1 tablet (40 mg total) by mouth 2 (two) times daily before a meal. 60 tablet 1    polyethylene glycol (MIRALAX / GLYCOLAX) 17 g packet Take 17 g by mouth daily as needed for mild constipation. (Patient not taking: Reported on 07/12/2023)     tiZANidine (ZANAFLEX) 4 MG tablet Take 1 tablet (4 mg total) by mouth 2 (two) times daily as needed. 60 tablet 2   Vitamin D, Ergocalciferol, 50000 units CAPS Take 1 capsule by mouth every 7 (seven) days. 4 capsule 5   Current Facility-Administered Medications  Medication Dose Route Frequency Provider Last Rate Last Admin   0.9 %  sodium chloride infusion  500 mL Intravenous Continuous Imogene Burn, MD       Facility-Administered Medications Ordered in Other Visits  Medication Dose Route Frequency Provider Last Rate Last Admin   0.9 %  sodium chloride  infusion (Manually program via Guardrails IV Fluids)  250 mL Intravenous Once Malachy Mood, MD       heparin lock flush 100 unit/mL  250 Units Intracatheter Once Malachy Mood, MD       sodium chloride flush (NS) 0.9 % injection 10 mL  10 mL Intracatheter Once Malachy Mood, MD       sodium chloride flush (NS) 0.9 % injection 10 mL  10 mL Intracatheter Once Malachy Mood, MD       sodium chloride flush (NS) 0.9 % injection 10 mL  10 mL Intracatheter Once Malachy Mood, MD        Allergies as of 07/13/2023 - Review Complete 07/13/2023  Allergen Reaction Noted   Coconut flavoring agent (non-screening) Itching and Rash 10/17/2016    Family History  Problem Relation Age of Onset   Lupus Mother    Diabetes Father    Hypertension Other    Colon cancer Neg Hx    Colon polyps Neg Hx     Social History   Socioeconomic History   Marital status: Single    Spouse name: Not on file   Number of children: 0   Years of education: Not on file   Highest education level: Not on file  Occupational History   Occupation: call center   Tobacco Use   Smoking status: Former    Current packs/day: 0.00    Average packs/day: 0.3 packs/day for 7.0 years (1.8 ttl pk-yrs)    Types: Cigarettes    Start date:  02/17/2012    Quit date: 02/17/2019    Years since quitting: 4.4    Passive exposure: Current   Smokeless tobacco: Never  Vaping Use   Vaping status: Some Days   Substances: Nicotine  Substance and Sexual Activity   Alcohol use: Not Currently    Comment: social   Drug use: Not Currently    Types: Marijuana   Sexual activity: Yes    Birth control/protection: None  Other Topics Concern   Not on file  Social History Narrative   Not on file   Social Determinants of Health   Financial Resource Strain: High Risk (02/26/2022)   Overall Financial Resource Strain (CARDIA)    Difficulty of Paying Living Expenses: Very hard  Food Insecurity: Food Insecurity Present (11/29/2022)   Hunger Vital Sign    Worried About Running Out of Food in the Last Year: Often true    Ran Out of Food in the Last Year: Often true  Transportation Needs: No Transportation Needs (11/29/2022)   PRAPARE - Administrator, Civil Service (Medical): No    Lack of Transportation (Non-Medical): No  Physical Activity: Not on file  Stress: Not on file  Social Connections: Not on file  Intimate Partner Violence: Not At Risk (11/29/2022)   Humiliation, Afraid, Rape, and Kick questionnaire    Fear of Current or Ex-Partner: No    Emotionally Abused: No    Physically Abused: No    Sexually Abused: No    Physical Exam: Vital signs in last 24 hours: BP (!) 156/88   Pulse 100   Temp (!) 97.4 F (36.3 C) (Temporal)   Ht 6' (1.829 m)   Wt 276 lb (125.2 kg)   SpO2 97%   BMI 37.43 kg/m  GEN: NAD EYE: Sclerae anicteric ENT: MMM CV: Non-tachycardic Pulm: No increased WOB GI: Soft NEURO:  Alert & Oriented   Eulah Pont, MD [facility]   07/13/2023 1:11 PM

## 2023-07-14 ENCOUNTER — Telehealth: Payer: Self-pay | Admitting: *Deleted

## 2023-07-14 NOTE — Telephone Encounter (Signed)
  Follow up Call-     07/13/2023    1:07 PM  Call back number  Post procedure Call Back phone  # [phone]  Permission to leave phone message Yes     Patient questions:  Do you have a fever, pain , or abdominal swelling? No. Pain Score  0 *  Have you tolerated food without any problems? Yes.    Have you been able to return to your normal activities? Yes.    Do you have any questions about your discharge instructions: Diet   No. Medications  No. Follow up visit  No.  Do you have questions or concerns about your Care? No.  Actions: * If pain score is 4 or above: No action needed, pain <4.

## 2023-07-14 NOTE — Telephone Encounter (Signed)
Received notification from [facility] regarding a prior authorization for ACTEMRA SQ. Authorization has been APPROVED from 07/13/2023 to 07/12/2024. Approval letter sent to scan center.  Per test claim, copay for 28 days supply is $4  Patient can fill through [facility]: [phone]   Authorization # [id number]  Patient can be scheduled for Actemra new start   Tammy Garrison, PharmD, MPH, BCPS, CPP Clinical Pharmacist (Rheumatology and Pulmonology)

## 2023-07-15 NOTE — Telephone Encounter (Signed)
ATC patient to schedule Actemra new start. Left VM requesting return call  [name], PharmD, MPH, BCPS, CPP Clinical Pharmacist (Rheumatology and Pulmonology)

## 2023-07-15 NOTE — Telephone Encounter (Signed)
Patient scheduled for Actemra new start on 07/21/23

## 2023-07-19 ENCOUNTER — Encounter: Payer: Self-pay | Admitting: Internal Medicine

## 2023-07-19 LAB — SURGICAL PATHOLOGY

## 2023-07-21 ENCOUNTER — Other Ambulatory Visit (HOSPITAL_COMMUNITY): Payer: Self-pay

## 2023-07-21 ENCOUNTER — Other Ambulatory Visit: Payer: Self-pay

## 2023-07-21 ENCOUNTER — Ambulatory Visit: Payer: Medicaid Other | Attending: Internal Medicine | Admitting: Pharmacist

## 2023-07-21 DIAGNOSIS — Z79899 Other long term (current) drug therapy: Secondary | ICD-10-CM

## 2023-07-21 DIAGNOSIS — M069 Rheumatoid arthritis, unspecified: Secondary | ICD-10-CM

## 2023-07-21 DIAGNOSIS — L732 Hidradenitis suppurativa: Secondary | ICD-10-CM

## 2023-07-21 DIAGNOSIS — Z7189 Other specified counseling: Secondary | ICD-10-CM

## 2023-07-21 MED ORDER — ACTEMRA ACTPEN 162 MG/0.9ML ~~LOC~~ SOAJ
162.0000 mg | SUBCUTANEOUS | 2 refills | Status: DC
  Filled 2023-07-21: qty 1.8, 28d supply, fill #0
  Filled 2023-08-10: qty 1.8, 28d supply, fill #1
  Filled 2023-09-17: qty 1.8, 28d supply, fill #2

## 2023-07-21 NOTE — Progress Notes (Signed)
Pharmacy Note  Subjective:   Patient presents to clinic today to receive first dose of Actemra for RA/HS. Patient currently takes leflunomide 20 mg daily. She is very hesitant and nevous to take injectable medication (this would be her first time)  Patient running a fever or have signs/symptoms of infection? No  Patient currently on antibiotics for the treatment of infection? No  Patient have any upcoming invasive procedures/surgeries? No  Objective: CMP     Component Value Date/Time   NA 140 07/12/2023 1051   K 3.8 07/12/2023 1051   CL 104 07/12/2023 1051   CO2 25 07/12/2023 1051   GLUCOSE 106 (H) 07/12/2023 1051   BUN 9 07/12/2023 1051   CREATININE 0.57 07/12/2023 1051   CALCIUM 9.6 07/12/2023 1051   PROT 7.7 07/12/2023 1051   ALBUMIN 3.2 (L) 05/10/2023 1222   AST 16 07/12/2023 1051   AST 26 03/30/2022 0943   ALT 17 07/12/2023 1051   ALT 42 03/30/2022 0943   ALKPHOS 85 05/10/2023 1222   BILITOT 0.2 07/12/2023 1051   BILITOT 0.6 03/30/2022 0943   GFRNONAA >60 05/10/2023 1222   GFRNONAA >60 03/30/2022 0943   GFRAA >60 04/12/2018 1520    CBC    Component Value Date/Time   WBC 6.3 07/12/2023 1051   RBC 4.09 07/12/2023 1051   HGB 11.1 (L) 07/12/2023 1051   HGB 10.8 (L) 03/30/2022 0943   HCT 34.5 (L) 07/12/2023 1051   HCT 30.0 (L) 03/20/2019 1613   PLT 447 (H) 07/12/2023 1051   PLT 155 03/30/2022 0943   MCV 84.4 07/12/2023 1051   MCH 27.1 07/12/2023 1051   MCHC 32.2 07/12/2023 1051   RDW 14.5 07/12/2023 1051   LYMPHSABS 1.2 03/22/2023 1210   MONOABS 0.5 03/22/2023 1210   EOSABS 132 07/12/2023 1051   BASOSABS 19 07/12/2023 1051    Baseline Immunosuppressant Therapy Labs TB GOLD    Latest Ref Rng & Units 12/25/2022    9:03 AM  Quantiferon TB Gold  Quantiferon TB Gold Plus NEGATIVE NEGATIVE    Hepatitis Panel    Latest Ref Rng & Units 12/25/2022    9:03 AM  Hepatitis  Hep B Surface Ag NON-REACTIVE NON-REACTIVE   Hep B IgM NON-REACTIVE NON-REACTIVE   Hep  C Ab NON-REACTIVE NON-REACTIVE    HIV Lab Results  Component Value Date   HIV Non Reactive 03/14/2023   HIV Non Reactive 02/07/2022   Immunoglobulins   SPEP    Latest Ref Rng & Units 07/12/2023   10:51 AM  Serum Protein Electrophoresis  Total Protein 6.1 - 8.1 g/dL 7.7    N8GN No results found for: "G6PDH" TPMT No results found for: "TPMT"   Chest x-ray: 11/29/22 - Stable radiographic appearance of the chest. No evidence of active cardiopulmonary process.  Assessment/Plan:  Reviewed importance of holding Actemra with signs/symptoms of an infections, if antibiotics are prescribed to treat an active infection, and with invasive procedures  Demonstrated proper injection technique with Actemra Actpen demo device  Patient able to demonstrate proper injection technique using the teach back method.  Patient self injected in the left lower abdomen with:  Sample Medication: Actemra 162mg /0.43mL pen injector NDC: [phone] Lot: [id number] Expiration: 09/2025  Patient tolerated well.  Observed for 30 mins in office for adverse reaction. Patient denies itchiness and irritation at injection., No swelling or redness noted., and Reviewed injection site reaction management with patient verbally and printed information for review in AVS  Patient is to return in 1 month  for labs and 6-8 weeks for follow-up appointment.  Standing orders for CBC/CMP and lipid placed.  TB gold will be monitored yearly. Lipid panel will be monitored every 6-12 months.  Actemra SQ approved through insurance .   Rx sent to: [facility]: [phone] .  Patient provided with pharmacy phone number and advised to call later this week to schedule shipment to home.  Patient will continue Actemra 162mg  subcut every 14 days in combination with leflunomide 20mg  daily. Can consider increasing frequency to Actemra 162mg  every 7 days in future if inadequate response  All questions encouraged and answered.   Instructed patient to call with any further questions or concerns.  [name], PharmD, MPH, BCPS, CPP Clinical Pharmacist (Rheumatology and Pulmonology)  07/21/2023 10:33 AM

## 2023-07-21 NOTE — Patient Instructions (Addendum)
Your next ACTEMRA SQ dose is due on 08/04/2023, 08/18/2023, and every 14 days thereafter  CONTINUE leflunomide 20mg  daily  HOLD ACTEMRA SQ and LEFLUNOMIDE if you have signs or symptoms of an infection. You can resume once you feel better or back to your baseline. HOLD ACTEMRA SQ and LEFLUNOMIDE if you start antibiotics to treat an infection. HOLD ACTEMRA SQ and LEFLUNOMIDE around the time of surgery/procedures. Your surgeon will be able to provide recommendations on when to hold BEFORE and when you are cleared to RESUME.  Pharmacy information: Your prescription will be shipped from [facility]. Their phone number is [phone] Mills Koller will call to schedule the first shipment and confirm address. Pharmacy will mail your medication to your home.  Labs are due in 1 month then every 3 months. Lab hours are from Monday to Thursday 8am-12:30pm and 1pm-5pm and Friday 8am-12pm. You do not need an appointment if you come for labs during these times. If you'd like to go to a [facility] or [facility] closer to home, please call our clinic 48 hours prior to lab date so we can release orders in a timely manner.  Stay up to date on all routine vaccines: influenza, pneumonia, COVID19, Shingles  How to manage an injection site reaction: Remember the 5 C's: COUNTER - leave on the counter at least 30 minutes but up to overnight to bring medication to room temperature. This may help prevent stinging COLD - place something cold (like an ice gel pack or cold water bottle) on the injection site just before cleansing with alcohol. This may help reduce pain CLARITIN - use Claritin (generic name is loratadine) for the first two weeks of treatment or the day of, the day before, and the day after injecting. This will help to minimize injection site reactions CORTISONE CREAM - apply if injection site is irritated and itching CALL ME - if injection site reaction is bigger than the size of your fist, looks  infected, blisters, or if you develop hives

## 2023-07-21 NOTE — Progress Notes (Signed)
Specialty Pharmacy Initial Fill Coordination Note  Tammy Garrison is a 32 y.o. female contacted today regarding initial fill of specialty medication(s) Tocilizumab (Actemra ACTPen)   Patient requested Delivery   Delivery date: 07/23/23   Verified address: [address] [place], [place] [address]   Medication will be filled on 07/22/2023.   Patient is aware of $4 copayment. She should still have a CC on file, if not then please bill to AR account and she will provide new card during refill call.

## 2023-07-22 ENCOUNTER — Other Ambulatory Visit (HOSPITAL_COMMUNITY): Payer: Self-pay

## 2023-07-23 NOTE — Progress Notes (Signed)
Patient completed first Actemra dose in clinic on 07/21/23. Was nervous to inject initially but expressed confidence after completing the dose. She believe she will be able to successfully administer Actemra at home  Will continue Actemra 162mg  SQ every 14 days and leflunomide 20mg  daily  If minimal response, can increase frequency to 162mg  SQ every 7 days based on body weight >100kg/ Dr. Dimple Casey wanted to start her on lwoer dose due to recent cancer treatment  [name], PharmD, MPH, BCPS, CPP Clinical Pharmacist (Rheumatology and Pulmonology)

## 2023-07-27 ENCOUNTER — Ambulatory Visit (HOSPITAL_COMMUNITY)
Admission: EM | Admit: 2023-07-27 | Discharge: 2023-07-27 | Disposition: A | Discharge status: Home / Self Care | Payer: Medicaid Other | Attending: Emergency Medicine | Admitting: Emergency Medicine

## 2023-07-27 ENCOUNTER — Encounter (HOSPITAL_COMMUNITY): Payer: Self-pay

## 2023-07-27 DIAGNOSIS — K047 Periapical abscess without sinus: Secondary | ICD-10-CM | POA: Diagnosis not present

## 2023-07-27 MED ORDER — PENICILLIN V POTASSIUM 500 MG PO TABS: 500.0000 mg | ORAL_TABLET | Freq: Three times a day (TID) | ORAL | 0 refills | Status: AC

## 2023-07-27 MED ORDER — KETOROLAC TROMETHAMINE 30 MG/ML IJ SOLN
INTRAMUSCULAR | Status: AC
  Filled 2023-07-27: qty 1

## 2023-07-27 MED ORDER — PENICILLIN G BENZATHINE 1200000 UNIT/2ML IM SUSY
PREFILLED_SYRINGE | INTRAMUSCULAR | Status: AC
  Filled 2023-07-27: qty 2

## 2023-07-27 MED ORDER — PENICILLIN G BENZATHINE 1200000 UNIT/2ML IM SUSY
1.2000 10*6.[IU] | PREFILLED_SYRINGE | Freq: Once | INTRAMUSCULAR | Status: AC
  Administered 2023-07-27: 1.2 10*6.[IU] via INTRAMUSCULAR

## 2023-07-27 MED ORDER — KETOROLAC TROMETHAMINE 30 MG/ML IJ SOLN
30.0000 mg | Freq: Once | INTRAMUSCULAR | Status: AC
  Administered 2023-07-27: 30 mg via INTRAMUSCULAR

## 2023-07-27 NOTE — Discharge Instructions (Signed)
During your visit today you received an injection of penicillin for rapid treatment of the infection of your right upper molar.  I have also sent a prescription for penicillin to your pharmacy that I would like for you to begin taking at noon today.  Please take 1 tablet every 8 hours for the next 14 days.  You received an injection of ketorolac during your visit today.  You are welcome to continue diclofenac tablets and Tylenol 1000 mg every 8 hours for pain relief as well.  You can try applying either heat or ice to the side of your face where there is swelling, whichever feels better.  This should help reduce the swelling as well.  If you have not had significant improvement of your pain and swelling in the next 24 to 48 hours or if your swelling and pain become worse, please go to the emergency room for further evaluation.  The infection may require stronger antibiotic treatment.  Thank you for visiting [facility] today.  We appreciate the opportunity to participate in your care.

## 2023-07-27 NOTE — ED Notes (Signed)
Pt reports her BP and HR obtained today's visit is typically her baseline.

## 2023-07-27 NOTE — ED Provider Notes (Signed)
[facility]    CSN: [id number] Arrival date & time: 07/27/23  0830    HISTORY   Chief Complaint  Patient presents with   Oral Swelling   HPI Tammy Garrison is a pleasant, 32 y.o. female who presents to urgent care today. Pt c/o oral swelling x 1 day and gum pain x 2 days. Pt reports she has not been able to sleep due to the pain. Pt currently rates her pain a 7/10. Pt voices concerns as she has rheumatoid arthritis and with the medications she is on, she cannot have an infection. Pt states she took Ibuprofen rapid release at 0600 this AM with no relief in pain.   The history is provided by the patient.   Past Medical History:  Diagnosis Date   Anemia    Bipolar 1 disorder (HCC)    Cancer (HCC)    squamous cell rectal cancer   Diabetes (HCC)    PREDIABETIC   ETOH abuse    H/O self-harm    H/O suicide attempt    x 5 - last 05/2016 - overdose Ibuprofen   Marijuana abuse    Rheumatoid arthritis (HCC)    Patient Active Problem List   Diagnosis Date Noted   Rheumatoid arthritis (HCC) 02/10/2023   Hidradenitis suppurativa 01/04/2023   High risk medication use 12/25/2022   Rheumatoid arthritis flare (HCC) 11/29/2022   Hyperkalemia 11/29/2022   Joint swelling 09/18/2022   Joint pain 09/18/2022   Fibroids 04/23/2022   Obesity (BMI 30-39.9) 04/22/2022   Abscess of right axilla 04/20/2022   Pancytopenia (HCC) 04/20/2022   PICC (peripherally inserted central catheter) in place 03/14/2022   Dehydration 03/13/2022   Nausea with vomiting 03/13/2022   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   Bipolar 1 disorder (HCC) 02/23/2022   RA (rheumatoid arthritis) (HCC) 02/07/2022   Syncope 02/06/2022   ABLA (acute blood loss anemia) 02/06/2022   BRBPR (bright red blood per rectum) 02/06/2022   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   Depression 05/20/2016   Marijuana dependence (HCC) 05/19/2016   Past Surgical History:   Procedure Laterality Date   HEMORRHOID SURGERY N/A 02/08/2022   Procedure: EXTERNAL AND INTERNAL HEMORRHOIDECTOMY;  Surgeon: Violeta Gelinas, MD;  Location: [facility] OR;  Service: General;  Laterality: N/A;   INCISION AND DRAINAGE ABSCESS Right 04/22/2022   Procedure: INCISION AND DRAINAGE axilla;  Surgeon: Sheliah Hatch, De Blanch, MD;  Location: [facility];  Service: General;  Laterality: Right;   RECTAL BIOPSY N/A 05/10/2023   Procedure: EXCISION OF LEFT PERIANAL NODULE;  Surgeon: Andria Meuse, MD;  Location: [facility] OR;  Service: General;  Laterality: N/A;   RECTAL EXAM UNDER ANESTHESIA N/A 07/21/2022   Procedure: RECTAL EXAM UNDER ANESTHESIA;  Surgeon: Andria Meuse, MD;  Location: [facility] OR;  Service: General;  Laterality: N/A;   RECTAL EXAM UNDER ANESTHESIA N/A 05/10/2023   Procedure: RECTAL EXAM UNDER ANESTHESIA;  Surgeon: Andria Meuse, MD;  Location: [facility] OR;  Service: General;  Laterality: N/A;  60   TRANSANAL HEMORRHOIDAL DEARTERIALIZATION N/A 07/21/2022   Procedure: ANAL CANAL BIOPSY;  Surgeon: Andria Meuse, MD;  Location: [facility] OR;  Service: General;  Laterality: N/A;   OB History   No obstetric history on file.    Home Medications    Prior to Admission medications   Medication Sig Start Date End Date Taking? Authorizing Provider  penicillin v potassium (VEETID) 500 MG tablet Take 1 tablet (500 mg total) by mouth  3 (three) times daily for 14 days. 07/27/23 08/10/23 Yes Theadora Rama Scales, PA-C  acetaminophen (TYLENOL) 325 MG tablet Take 2 tablets (650 mg total) by mouth every 6 (six) hours as needed for mild pain. Patient taking differently: Take 500-1,500 mg by mouth every 6 (six) hours as needed for mild pain (pain score 1-3). 12/01/22   Standley Brooking, MD  diclofenac (VOLTAREN) 75 MG EC tablet Take 1 tablet (75 mg total) by mouth 2 (two) times daily as needed with food 06/28/23     dicyclomine (BENTYL) 20 MG tablet Take 1 tablet (20 mg total) by mouth 3 (three) times  daily as needed for spasms. 06/08/23   Doree Albee, PA-C  DULoxetine (CYMBALTA) 60 MG capsule Take 1 capsule (60 mg total) by mouth at bedtime. 05/12/23   Rice, Jamesetta Orleans, MD  famotidine (PEPCID) 20 MG tablet Take 1 tablet (20 mg total) by mouth 2 (two) times daily. 07/13/23   Imogene Burn, MD  gabapentin (NEURONTIN) 300 MG capsule Take 1 capsule (300 mg total) by mouth 2 (two) times daily. 05/24/23     hydrocortisone (ANUSOL-HC) 2.5 % rectal cream Place 1 application rectally 2 (two) times daily for 7 days. 07/13/23   Imogene Burn, MD  leflunomide (ARAVA) 20 MG tablet Take 1 tablet (20 mg total) by mouth at bedtime. 05/12/23   Fuller Plan, MD  pantoprazole (PROTONIX) 40 MG tablet Take 1 tablet (40 mg total) by mouth 2 (two) times daily before a meal. 06/08/23 08/07/23  Doree Albee, PA-C  tiZANidine (ZANAFLEX) 4 MG tablet Take 1 tablet (4 mg total) by mouth 2 (two) times daily as needed. 06/28/23     Tocilizumab (ACTEMRA ACTPEN) 162 MG/0.9ML SOAJ Inject 162 mg into the skin every 14 (fourteen) days. 07/21/23   Fuller Plan, MD  Vitamin D, Ergocalciferol, 50000 units CAPS Take 1 capsule by mouth every 7 (seven) days. 05/14/23       Family History Family History  Problem Relation Age of Onset   Lupus Mother    Diabetes Father    Hypertension Other    Colon cancer Neg Hx    Colon polyps Neg Hx    Stomach cancer Neg Hx    Social History Social History   Tobacco Use   Smoking status: Former    Current packs/day: 0.00    Average packs/day: 0.3 packs/day for 7.0 years (1.8 ttl pk-yrs)    Types: Cigarettes    Start date: 02/17/2012    Quit date: 02/17/2019    Years since quitting: 4.4    Passive exposure: Current   Smokeless tobacco: Never  Vaping Use   Vaping status: Some Days   Substances: Nicotine  Substance Use Topics   Alcohol use: Not Currently    Comment: social   Drug use: Not Currently    Frequency: 7.0 times per week    Types: Marijuana    Allergies   Coconut flavoring agent (non-screening)  Review of Systems Review of Systems Pertinent findings revealed after performing a 14 point review of systems has been noted in the history of present illness.  Physical Exam Vital Signs BP (!) 156/114 (BP Location: Right Arm)   Pulse (!) 118   Temp 98.6 F (37 C) (Oral)   Resp 18   Ht 6' (1.829 m)   Wt 273 lb (123.8 kg)   LMP  (Within Years)   SpO2 99%   BMI 37.03 kg/m   No data found.  Physical Exam Vitals and nursing note reviewed.  Constitutional:      General: She is not in acute distress.    Appearance: Normal appearance.  HENT:     Head: Normocephalic.     Comments: Significant swelling of right cheek with mild induration    Mouth/Throat:   Eyes:     Pupils: Pupils are equal, round, and reactive to light.  Cardiovascular:     Rate and Rhythm: Normal rate and regular rhythm.  Pulmonary:     Effort: Pulmonary effort is normal.     Breath sounds: Normal breath sounds.  Musculoskeletal:        General: Normal range of motion.     Cervical back: Normal range of motion and neck supple.  Skin:    General: Skin is warm and dry.  Neurological:     General: No focal deficit present.     Mental Status: She is alert and oriented to person, place, and time. Mental status is at baseline.  Psychiatric:        Mood and Affect: Mood normal.        Behavior: Behavior normal.        Thought Content: Thought content normal.        Judgment: Judgment normal.     Visual Acuity Right Eye Distance:   Left Eye Distance:   Bilateral Distance:    Right Eye Near:   Left Eye Near:    Bilateral Near:     UC Couse / Diagnostics / Procedures:     Radiology No results found.  Procedures Procedures (including critical care time) EKG  Pending results:  Labs Reviewed - No data to display  Medications Ordered in UC: Medications  penicillin g benzathine (BICILLIN LA) 1200000 UNIT/2ML injection 1.2 Million Units  (has no administration in time range)  ketorolac (TORADOL) 30 MG/ML injection 30 mg (has no administration in time range)    UC Diagnoses / Final Clinical Impressions(s)   I have reviewed the triage vital signs and the nursing notes.  Pertinent labs & imaging results that were available during my care of the patient were reviewed by me and considered in my medical decision making (see chart for details).    Final diagnoses:  Dental abscess   Patient provided with an injection of Bicillin for rapid treatment of dental abscess.  Patient advised to continue penicillin 500 mg 3 times daily for the next 14 days.  Patient encouraged to follow-up with a dentist.  Patient provided with ketorolac injection for pain and advised to continue ibuprofen.  Patient currently taking diclofenac for arthritis, has not taken it yet this morning.  Patient advised to go to ED if symptoms not improving or worsening in the next 24 hours.  Please see discharge instructions below for details of plan of care as provided to patient. ED Prescriptions     Medication Sig Dispense Auth. Provider   penicillin v potassium (VEETID) 500 MG tablet Take 1 tablet (500 mg total) by mouth 3 (three) times daily for 14 days. 42 tablet Theadora Rama Scales, PA-C      PDMP not reviewed this encounter.  Pending results:  Labs Reviewed - No data to display    Discharge Instructions      During your visit today you received an injection of penicillin for rapid treatment of the infection of your right upper molar.  I have also sent a prescription for penicillin to your pharmacy that I would like for you to  begin taking at noon today.  Please take 1 tablet every 8 hours for the next 14 days.  You received an injection of ketorolac during your visit today.  You are welcome to continue diclofenac tablets and Tylenol 1000 mg every 8 hours for pain relief as well.  You can try applying either heat or ice to the side of your face  where there is swelling, whichever feels better.  This should help reduce the swelling as well.  If you have not had significant improvement of your pain and swelling in the next 24 to 48 hours or if your swelling and pain become worse, please go to the emergency room for further evaluation.  The infection may require stronger antibiotic treatment.  Thank you for visiting [facility] today.  We appreciate the opportunity to participate in your care.    Disposition Upon Discharge:  Condition: stable for discharge home  Patient presented with an acute illness with associated systemic symptoms and significant discomfort requiring urgent management. In my opinion, this is a condition that a prudent lay person (someone who possesses an average knowledge of health and medicine) may potentially expect to result in complications if not addressed urgently such as respiratory distress, impairment of bodily function or dysfunction of bodily organs.   Routine symptom specific, illness specific and/or disease specific instructions were discussed with the patient and/or caregiver at length.   As such, the patient has been evaluated and assessed, work-up was performed and treatment was provided in alignment with urgent care protocols and evidence based medicine.  Patient/parent/caregiver has been advised that the patient may require follow up for further testing and treatment if the symptoms continue in spite of treatment, as clinically indicated and appropriate.  Patient/parent/caregiver has been advised to return to the [facility] or PCP if no better; to PCP or the Emergency Department if new signs and symptoms develop, or if the current signs or symptoms continue to change or worsen for further workup, evaluation and treatment as clinically indicated and appropriate  The patient will follow up with their current PCP if and as advised. If the patient does not currently have a PCP we will assist them in  obtaining one.   The patient may need specialty follow up if the symptoms continue, in spite of conservative treatment and management, for further workup, evaluation, consultation and treatment as clinically indicated and appropriate.  Patient/parent/caregiver verbalized understanding and agreement of plan as discussed.  All questions were addressed during visit.  Please see discharge instructions below for further details of plan.  This office note has been dictated using Teaching laboratory technician.  Unfortunately, this method of dictation can sometimes lead to typographical or grammatical errors.  I apologize for your inconvenience in advance if this occurs.  Please do not hesitate to reach out to me if clarification is needed.      Theadora Rama Dora, [place] 07/27/23 [phone]

## 2023-07-27 NOTE — ED Triage Notes (Signed)
Pt presents with oral swelling x 1 day and gum pain x 2 days. Pt reports she has not been able to sleep due to the pain. Pt currently rates her pain a 7/10. Pt voices concerns as she has rheumatoid arthritis and with the medications she is on, she cannot have an infection. Pt states she took Ibuprofen rapid release at 0600 this AM with no relief in pain.

## 2023-08-03 ENCOUNTER — Other Ambulatory Visit: Payer: Self-pay

## 2023-08-03 ENCOUNTER — Telehealth: Payer: Self-pay

## 2023-08-03 ENCOUNTER — Encounter: Payer: Self-pay | Admitting: Hematology

## 2023-08-03 NOTE — Telephone Encounter (Signed)
 Patient contacted the office and states she is due to take her Actemra  injection on 08/04/2023. Patient states on Christmas Eve she got an abscess in her mouth and was put on Penicillin . Patient states she is still currently taking the Penicillin . Patient inquires if she needs to put off her Actemra . Please advise.

## 2023-08-03 NOTE — Telephone Encounter (Signed)
 Please advise patient to hold actemra until completing the course of antibiotics and until the infection has completely cleared.

## 2023-08-03 NOTE — Telephone Encounter (Signed)
Urgent, please advise.

## 2023-08-03 NOTE — Telephone Encounter (Signed)
 Patient advised to hold actemra until completing the course of antibiotics and until the infection has completely cleared. Patient verbalized understanding.

## 2023-08-10 ENCOUNTER — Other Ambulatory Visit (HOSPITAL_COMMUNITY): Payer: Self-pay | Admitting: Pharmacy Technician

## 2023-08-10 ENCOUNTER — Other Ambulatory Visit (HOSPITAL_COMMUNITY): Payer: Self-pay

## 2023-08-10 NOTE — Progress Notes (Signed)
 Specialty Pharmacy Refill Coordination Note  Tammy Garrison is a 33 y.o. female contacted today regarding refills of specialty medication(s) Tocilizumab  (Actemra  ACTPen)   Patient requested Delivery   Delivery date: 08/25/23   Verified address: [address] [place], [place]   Medication will be filled on 08/24/23.

## 2023-08-15 ENCOUNTER — Encounter (HOSPITAL_COMMUNITY): Payer: Self-pay

## 2023-08-15 ENCOUNTER — Other Ambulatory Visit: Payer: Self-pay

## 2023-08-15 ENCOUNTER — Emergency Department (HOSPITAL_COMMUNITY)
Admission: EM | Admit: 2023-08-15 | Discharge: 2023-08-15 | Disposition: A | Discharge status: Home / Self Care | Payer: Medicaid Other | Attending: Emergency Medicine | Admitting: Emergency Medicine

## 2023-08-15 ENCOUNTER — Emergency Department (HOSPITAL_COMMUNITY): Payer: Medicaid Other

## 2023-08-15 ENCOUNTER — Ambulatory Visit (HOSPITAL_COMMUNITY)
Admission: EM | Admit: 2023-08-15 | Discharge: 2023-08-15 | Disposition: A | Discharge status: Home / Self Care | Payer: Medicaid Other

## 2023-08-15 DIAGNOSIS — I10 Essential (primary) hypertension: Secondary | ICD-10-CM

## 2023-08-15 DIAGNOSIS — L03211 Cellulitis of face: Secondary | ICD-10-CM | POA: Diagnosis not present

## 2023-08-15 DIAGNOSIS — R079 Chest pain, unspecified: Secondary | ICD-10-CM

## 2023-08-15 DIAGNOSIS — K047 Periapical abscess without sinus: Secondary | ICD-10-CM

## 2023-08-15 DIAGNOSIS — L0291 Cutaneous abscess, unspecified: Secondary | ICD-10-CM | POA: Insufficient documentation

## 2023-08-15 LAB — URINALYSIS, W/ REFLEX TO CULTURE (INFECTION SUSPECTED)
Bacteria, UA: NONE SEEN
Bilirubin Urine: NEGATIVE
Glucose, UA: NEGATIVE mg/dL
Hgb urine dipstick: NEGATIVE
Ketones, ur: NEGATIVE mg/dL
Leukocytes,Ua: NEGATIVE
Nitrite: NEGATIVE
Protein, ur: NEGATIVE mg/dL
Specific Gravity, Urine: 1.016 (ref 1.005–1.030)
pH: 5 (ref 5.0–8.0)

## 2023-08-15 LAB — CBC WITH DIFFERENTIAL/PLATELET
Abs Immature Granulocytes: 0.01 10*3/uL (ref 0.00–0.07)
Basophils Absolute: 0 10*3/uL (ref 0.0–0.1)
Basophils Relative: 1 %
Eosinophils Absolute: 0.5 10*3/uL (ref 0.0–0.5)
Eosinophils Relative: 12 %
HCT: 39.7 % (ref 36.0–46.0)
Hemoglobin: 12.5 g/dL (ref 12.0–15.0)
Immature Granulocytes: 0 %
Lymphocytes Relative: 25 %
Lymphs Abs: 1.1 10*3/uL (ref 0.7–4.0)
MCH: 27.8 pg (ref 26.0–34.0)
MCHC: 31.5 g/dL (ref 30.0–36.0)
MCV: 88.4 fL (ref 80.0–100.0)
Monocytes Absolute: 0.4 10*3/uL (ref 0.1–1.0)
Monocytes Relative: 9 %
Neutro Abs: 2.3 10*3/uL (ref 1.7–7.7)
Neutrophils Relative %: 53 %
Platelets: 337 10*3/uL (ref 150–400)
RBC: 4.49 MIL/uL (ref 3.87–5.11)
RDW: 16.1 % — ABNORMAL HIGH (ref 11.5–15.5)
WBC: 4.3 10*3/uL (ref 4.0–10.5)
nRBC: 0 % (ref 0.0–0.2)

## 2023-08-15 LAB — COMPREHENSIVE METABOLIC PANEL
ALT: 26 U/L (ref 0–44)
AST: 24 U/L (ref 15–41)
Albumin: 4 g/dL (ref 3.5–5.0)
Alkaline Phosphatase: 106 U/L (ref 38–126)
Anion gap: 10 (ref 5–15)
BUN: 9 mg/dL (ref 6–20)
CO2: 24 mmol/L (ref 22–32)
Calcium: 9.5 mg/dL (ref 8.9–10.3)
Chloride: 104 mmol/L (ref 98–111)
Creatinine, Ser: 0.56 mg/dL (ref 0.44–1.00)
GFR, Estimated: >60 mL/min (ref >60)
Glucose, Bld: 98 mg/dL (ref 70–99)
Potassium: 3.8 mmol/L (ref 3.5–5.1)
Sodium: 138 mmol/L (ref 135–145)
Total Bilirubin: 0.5 mg/dL (ref 0.0–1.2)
Total Protein: 8.2 g/dL — ABNORMAL HIGH (ref 6.5–8.1)

## 2023-08-15 LAB — HCG, SERUM, QUALITATIVE: Preg, Serum: NEGATIVE

## 2023-08-15 LAB — I-STAT CG4 LACTIC ACID, ED: Lactic Acid, Venous: 1 mmol/L (ref 0.5–1.9)

## 2023-08-15 LAB — TROPONIN I (HIGH SENSITIVITY): Troponin I (High Sensitivity): 4 ng/L (ref <18)

## 2023-08-15 MED ORDER — SODIUM CHLORIDE 0.9 % IV BOLUS
500.0000 mL | Freq: Once | INTRAVENOUS | Status: AC
  Administered 2023-08-15: 500 mL via INTRAVENOUS

## 2023-08-15 MED ORDER — HYDROCODONE-ACETAMINOPHEN 5-325 MG PO TABS
1.0000 | ORAL_TABLET | Freq: Once | ORAL | Status: AC
  Administered 2023-08-15: 1 via ORAL
  Filled 2023-08-15: qty 1

## 2023-08-15 MED ORDER — KETOROLAC TROMETHAMINE 15 MG/ML IJ SOLN
15.0000 mg | Freq: Once | INTRAMUSCULAR | Status: AC
  Administered 2023-08-15: 15 mg via INTRAVENOUS
  Filled 2023-08-15: qty 1

## 2023-08-15 MED ORDER — DOXYCYCLINE HYCLATE 100 MG PO TABS
100.0000 mg | ORAL_TABLET | Freq: Once | ORAL | Status: AC
  Administered 2023-08-15: 100 mg via ORAL
  Filled 2023-08-15: qty 1

## 2023-08-15 MED ORDER — AMLODIPINE BESYLATE 5 MG PO TABS
5.0000 mg | ORAL_TABLET | Freq: Once | ORAL | Status: AC
  Administered 2023-08-15: 5 mg via ORAL
  Filled 2023-08-15: qty 1

## 2023-08-15 MED ORDER — AMOXICILLIN-POT CLAVULANATE 875-125 MG PO TABS
1.0000 | ORAL_TABLET | Freq: Two times a day (BID) | ORAL | 0 refills | Status: DC
  Filled 2023-08-15 – 2023-08-16 (×2): qty 19, 10d supply, fill #0

## 2023-08-15 MED ORDER — DIPHENHYDRAMINE HCL 50 MG/ML IJ SOLN
25.0000 mg | Freq: Once | INTRAMUSCULAR | Status: AC
Start: 2023-08-15 — End: 2023-08-15
  Administered 2023-08-15: 25 mg via INTRAVENOUS
  Filled 2023-08-15: qty 1

## 2023-08-15 MED ORDER — AMOXICILLIN-POT CLAVULANATE 875-125 MG PO TABS
1.0000 | ORAL_TABLET | Freq: Once | ORAL | Status: AC
  Administered 2023-08-15: 1 via ORAL
  Filled 2023-08-15: qty 1

## 2023-08-15 MED ORDER — HYDRALAZINE HCL 20 MG/ML IJ SOLN
10.0000 mg | Freq: Once | INTRAMUSCULAR | Status: AC
Start: 2023-08-15 — End: 2023-08-15
  Administered 2023-08-15: 10 mg via INTRAVENOUS
  Filled 2023-08-15: qty 1

## 2023-08-15 MED ORDER — DOXYCYCLINE HYCLATE 100 MG PO CAPS
100.0000 mg | ORAL_CAPSULE | Freq: Two times a day (BID) | ORAL | 0 refills | Status: DC
Start: 2023-08-15 — End: 2024-05-08
  Filled 2023-08-15: qty 19, 10d supply, fill #0

## 2023-08-15 MED ORDER — METOCLOPRAMIDE HCL 5 MG/ML IJ SOLN
10.0000 mg | Freq: Once | INTRAMUSCULAR | Status: AC
  Administered 2023-08-15: 10 mg via INTRAVENOUS
  Filled 2023-08-15: qty 2

## 2023-08-15 NOTE — ED Provider Notes (Signed)
 [place] EM[facility] DEPARTMENT AT [facility] Provider Note   CSN: [id number] Arrival date & time: 08/15/23  1423     History  Chief Complaint  Patient presents with   Abscess    Tammy Garrison is a 33 y.o. female, history of rheumatoid arthritis who presents to the ED secondary to multiple complaints.  She states that for the last couple days, she has not been feeling well, her blood pressure has been elevated, and she states she has had intermittent chest pain.  She states the chest pain, comes and goes, and she just does not really notice without much.  No associated shortness of breath, dizziness, or headache.  She states she has been in more pain as of usual, because she is in a rheumatoid flare currently, as well as she has an abscess behind her left ear, and dental pain, on the right side of her mouth.  She has been prescribed penicillin , without any relief or improvement of her symptoms.   Home Medications Prior to Admission medications   Medication Sig Start Date End Date Taking? Authorizing Provider  amoxicillin -clavulanate (AUGMENTIN ) 875-125 MG tablet Take 1 tablet by mouth every 12 (twelve) hours. 08/15/23  Yes Righteous Claiborne L, PA  doxycycline  (VIBRAMYCIN ) 100 MG capsule Take 1 capsule (100 mg total) by mouth 2 (two) times daily. 08/15/23  Yes Marly Schuld L, PA  acetaminophen  (TYLENOL ) 325 MG tablet Take 2 tablets (650 mg total) by mouth every 6 (six) hours as needed for mild pain. Patient taking differently: Take 500-1,500 mg by mouth every 6 (six) hours as needed for mild pain (pain score 1-3). 12/01/22   Jadine Toribio SQUIBB, MD  diclofenac  (VOLTAREN ) 75 MG EC tablet Take 1 tablet (75 mg total) by mouth 2 (two) times daily as needed with food 06/28/23     dicyclomine  (BENTYL ) 20 MG tablet Take 1 tablet (20 mg total) by mouth 3 (three) times daily as needed for spasms. 06/08/23   Craig Alan SAUNDERS, PA-C  DULoxetine  (CYMBALTA ) 60 MG capsule Take 1 capsule (60  mg total) by mouth at bedtime. 05/12/23   Rice, Lonni ORN, MD  famotidine  (PEPCID ) 20 MG tablet Take 1 tablet (20 mg total) by mouth 2 (two) times daily. 07/13/23   Federico Rosario BROCKS, MD  gabapentin  (NEURONTIN ) 300 MG capsule Take 1 capsule (300 mg total) by mouth 2 (two) times daily. 05/24/23     hydrocortisone  (ANUSOL -HC) 2.5 % rectal cream Place 1 application rectally 2 (two) times daily for 7 days. 07/13/23   Federico Rosario BROCKS, MD  leflunomide  (ARAVA ) 20 MG tablet Take 1 tablet (20 mg total) by mouth at bedtime. 05/12/23   Jeannetta Lonni ORN, MD  pantoprazole  (PROTONIX ) 40 MG tablet Take 1 tablet (40 mg total) by mouth 2 (two) times daily before a meal. 06/08/23 08/07/23  Craig Alan SAUNDERS, PA-C  tiZANidine  (ZANAFLEX ) 4 MG tablet Take 1 tablet (4 mg total) by mouth 2 (two) times daily as needed. 06/28/23     Tocilizumab  (ACTEMRA  ACTPEN) 162 MG/0.9ML SOAJ Inject 162 mg into the skin every 14 (fourteen) days. 07/21/23   Rice, Lonni ORN, MD  Vitamin D , Ergocalciferol , 50000 units CAPS Take 1 capsule by mouth every 7 (seven) days. 05/14/23         Allergies    Coconut flavoring agent (non-screening)    Review of Systems   Review of Systems  HENT:  Positive for ear pain.   Respiratory:  Negative for shortness of breath.  Cardiovascular:  Positive for chest pain.    Physical Exam Updated Vital Signs BP (!) 171/118   Pulse 84   Temp 97.9 F (36.6 C) (Oral)   Resp 17   Ht 6' (1.829 m)   Wt 123.8 kg   SpO2 100%   BMI 37.02 kg/m  Physical Exam Vitals and nursing note reviewed.  Constitutional:      General: She is not in acute distress.    Appearance: She is well-developed.  HENT:     Head: Normocephalic and atraumatic.     Right Ear: Tympanic membrane normal.     Left Ear: Tympanic membrane normal.     Ears:     Comments: 1 cmx1cm abscess to posterior lobe of left ear w/o ttp or doughyness to mastoid. No overlying erythema.    Mouth/Throat:      Comments: Erythema to right  tooth, as noted above, with increased tenderness.  No overt abscess present.  No trismus.  No oropharyngeal swelling. Eyes:     Conjunctiva/sclera: Conjunctivae normal.  Cardiovascular:     Rate and Rhythm: Normal rate and regular rhythm.     Heart sounds: No murmur heard. Pulmonary:     Effort: Pulmonary effort is normal. No respiratory distress.     Breath sounds: Normal breath sounds.  Abdominal:     Palpations: Abdomen is soft.     Tenderness: There is no abdominal tenderness.  Musculoskeletal:        General: No swelling.     Cervical back: Neck supple.  Skin:    General: Skin is warm and dry.     Capillary Refill: Capillary refill takes less than 2 seconds.  Neurological:     General: No focal deficit present.     Mental Status: She is alert.     Cranial Nerves: Cranial nerves 2-12 are intact.  Psychiatric:        Mood and Affect: Mood normal.     ED Results / Procedures / Treatments   Labs (all labs ordered are listed, but only abnormal results are displayed) Labs Reviewed  COMPREHENSIVE METABOLIC PANEL - Abnormal; Notable for the following components:      Result Value   Total Protein 8.2 (*)    All other components within normal limits  CBC WITH DIFFERENTIAL/PLATELET - Abnormal; Notable for the following components:   RDW 16.1 (*)    All other components within normal limits  URINALYSIS, W/ REFLEX TO CULTURE (INFECTION SUSPECTED)  HCG, SERUM, QUALITATIVE  I-STAT CG4 LACTIC ACID, ED  TROPONIN I (HIGH SENSITIVITY)  TROPONIN I (HIGH SENSITIVITY)   EKG None  Radiology DG Chest 2 View Result Date: 08/15/2023 CLINICAL DATA:  Chest pain.  Elevated blood pressure. EXAM: CHEST - 2 VIEW COMPARISON:  Radiographs 11/29/2022 FINDINGS: The cardiomediastinal contours are normal. The lungs are clear. Pulmonary vasculature is normal. No consolidation, pleural effusion, or pneumothorax. No acute osseous abnormalities are seen. IMPRESSION: No active cardiopulmonary disease.  Electronically Signed   By: Andrea Gasman M.D.   On: 08/15/2023 16:44    Procedures Procedures   Medications Ordered in ED Medications  hydrALAZINE  (APRESOLINE ) injection 10 mg (has no administration in time range)  amoxicillin -clavulanate (AUGMENTIN ) 875-125 MG per tablet 1 tablet (1 tablet Oral Given 08/15/23 1614)  HYDROcodone -acetaminophen  (NORCO/VICODIN) 5-325 MG per tablet 1 tablet (1 tablet Oral Given 08/15/23 1614)  doxycycline  (VIBRA -TABS) tablet 100 mg (100 mg Oral Given 08/15/23 1720)  metoCLOPramide  (REGLAN ) injection 10 mg (10 mg Intravenous Given 08/15/23 1719)  ketorolac  (TORADOL ) 15 MG/ML injection 15 mg (15 mg Intravenous Given 08/15/23 1719)  diphenhydrAMINE  (BENADRYL ) injection 25 mg (25 mg Intravenous Given 08/15/23 1719)  sodium chloride  0.9 % bolus 500 mL (500 mLs Intravenous New Bag/Given 08/15/23 1720)    ED Course/ Medical Decision Making/ A&P                                 Medical Decision Making Patient is a well-appearing 33 year old female, biologic, for rheumatoid arthritis here for multiple complaints 1 being chest discomfort with elevated blood pressure, was sent by urgent care due to this.  Notes that she has not missed any medicines, but is in a rheumatoid arthritis flare so does not know if that has something to do with it.  She denies any vision changes, initially told me no headache, but then went on to tell me as she had a headache.  We gave her a migraine cocktail, Norco for her tooth pain, and abscess.  I recommended that she get an I&D of the abscess behind her left ear, and she declined.  She has no mastoid doughiness.  She is informed that this may cause worsening of her abscess, or no resolution of her abscess and she voiced understanding.  States she will follow-up with her PCP in regards to this.  She states she is in quite a bit of pain, will attempt to control pain, prior to giving hypertenion meds  Amount and/or Complexity of Data Reviewed Labs:  ordered.    Details: Labs unremarkable negative troponin Radiology: ordered.    Details: Chest x-ray clear ECG/medicine tests:  Decision-making details documented in ED Course. Discussion of management or test interpretation with external provider(s): Troponin reassuring, negative, no hypertensive emergency, given chest pain, and headache.  She has no neurodeficits on my exam which is reassuring she feels better after the headache cocktail and the Norco.  I believe that some of her pain is exacerbating her hypertension.  She was still slightly hypertensive, on discharge, this hydralazine  was given.  She was instructed to follow-up with her PCP in the a.m., to talk about blood pressure medication, as well as given information for dentist.  She will need to follow-up with a dentist, in regards to her dental infection.  I put her on a combination of doxycycline  and Augmentin , secondary to her tooth infection as well as skin infection.  I discussed this with our pharmacist, he states this will be a much better alternative than clindamycin given resistance rates.  I provided her with resources for a dental provider  Risk Prescription drug management.    Final Clinical Impression(s) / ED Diagnoses Final diagnoses:  Abscess  Dental infection  Chest pain, unspecified type  Uncontrolled hypertension    Rx / DC Orders ED Discharge Orders          Ordered    doxycycline  (VIBRAMYCIN ) 100 MG capsule  2 times daily        08/15/23 1824    amoxicillin -clavulanate (AUGMENTIN ) 875-125 MG tablet  Every 12 hours        08/15/23 1824              Philippa Lyle CROME, PA 08/15/23 AMOS    Garrick Charleston, MD 08/15/23 2125

## 2023-08-15 NOTE — ED Triage Notes (Signed)
 Sent here by UC for high blood pressure, right sided facial abscess that did not improve after course of penicillin.

## 2023-08-15 NOTE — ED Triage Notes (Signed)
 Pt reports she has been having elevated BP readings x 2 days. States even on her BP meds it is reading high. She has been having headache only above right eye and slight blurred vision x 2 days

## 2023-08-15 NOTE — Discharge Instructions (Signed)
 There was concern for failed antibiotic therapy for your facial abscess.  You have a new abscess behind your left ear.  You may need a head CT for further evaluation along with blood work.  Your blood pressure may be elevated secondary to your current increased pain.

## 2023-08-15 NOTE — Discharge Instructions (Addendum)
 Please follow-up with your primary care doctor, and dentist.  I prescribed you 2 antibiotics, please take them as prescribed.  You will need to get your abscess lanced, you declined this today, but you should follow-up with your PCP and dermatologist in regards to this.  You will need to also follow-up with a dentist, as your dental infection, will not go away until you get your tooth fixed.  Your chest pain, your chest x-ray and EKG are within normal limits, and troponin is within normal limits.  Please return if you feel like your symptoms are worsening.  You will need to talk to your PCP, in the next couple days, about blood pressure medication, and any need for an additional blood pressure management med

## 2023-08-15 NOTE — ED Provider Notes (Signed)
[facility]    CSN: [id number] Arrival date & time: 08/15/23  1222      History   Chief Complaint No chief complaint on file.   HPI Tammy Garrison is a 33 y.o. female.   Presents today for increased blood pressure x 2 days and not feeling good.  Her home blood pressure was listed at 184/124.  She is taking her metoprolol  xl 25mg  as prescribed.  She is reporting dizziness pain be behind her right eye.  She reports that she was on antibiotics for a right facial abscess and just finished this last week.  She continues to have right facial pain.  She denies any chest pain at this time.  She is having increased pain secondary to a current rheumatological fat flare.  The history is provided by the patient.    Past Medical History:  Diagnosis Date   Anemia    Bipolar 1 disorder (HCC)    Cancer (HCC)    squamous cell rectal cancer   Diabetes (HCC)    PREDIABETIC   ETOH abuse    H/O self-harm    H/O suicide attempt    x 5 - last 05/2016 - overdose Ibuprofen    Marijuana abuse    Rheumatoid arthritis (HCC)     Patient Active Problem List   Diagnosis Date Noted   Rheumatoid arthritis (HCC) 02/10/2023   Hidradenitis suppurativa 01/04/2023   High risk medication use 12/25/2022   Rheumatoid arthritis flare (HCC) 11/29/2022   Hyperkalemia 11/29/2022   Joint swelling 09/18/2022   Joint pain 09/18/2022   Fibroids 04/23/2022   Obesity (BMI 30-39.9) 04/22/2022   Abscess of right axilla 04/20/2022   Pancytopenia (HCC) 04/20/2022   PICC (peripherally inserted central catheter) in place 03/14/2022   Dehydration 03/13/2022   Nausea with vomiting 03/13/2022   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   Bipolar 1 disorder (HCC) 02/23/2022   RA (rheumatoid arthritis) (HCC) 02/07/2022   Syncope 02/06/2022   ABLA (acute blood loss anemia) 02/06/2022   BRBPR (bright red blood per rectum) 02/06/2022   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   Depression 05/20/2016   Marijuana dependence (HCC) 05/19/2016    Past Surgical History:  Procedure Laterality Date   HEMORRHOID SURGERY N/A 02/08/2022   Procedure: EXTERNAL AND INTERNAL HEMORRHOIDECTOMY;  Surgeon: Sebastian Moles, MD;  Location: [facility] OR;  Service: General;  Laterality: N/A;   INCISION AND DRAINAGE ABSCESS Right 04/22/2022   Procedure: INCISION AND DRAINAGE axilla;  Surgeon: Stevie, Herlene Righter, MD;  Location: [facility] ORS;  Service: General;  Laterality: Right;   RECTAL BIOPSY N/A 05/10/2023   Procedure: EXCISION OF LEFT PERIANAL NODULE;  Surgeon: Teresa Lonni HERO, MD;  Location: [facility] OR;  Service: General;  Laterality: N/A;   RECTAL EXAM UNDER ANESTHESIA N/A 07/21/2022   Procedure: RECTAL EXAM UNDER ANESTHESIA;  Surgeon: Teresa Lonni HERO, MD;  Location: [facility] OR;  Service: General;  Laterality: N/A;   RECTAL EXAM UNDER ANESTHESIA N/A 05/10/2023   Procedure: RECTAL EXAM UNDER ANESTHESIA;  Surgeon: Teresa Lonni HERO, MD;  Location: [facility] OR;  Service: General;  Laterality: N/A;  60   TRANSANAL HEMORRHOIDAL DEARTERIALIZATION N/A 07/21/2022   Procedure: ANAL CANAL BIOPSY;  Surgeon: Teresa Lonni HERO, MD;  Location: [facility] OR;  Service: General;  Laterality: N/A;    OB History   No obstetric history on file.      Home Medications    Prior to Admission medications   Medication Sig Start  Date End Date Taking? Authorizing Provider  acetaminophen  (TYLENOL ) 325 MG tablet Take 2 tablets (650 mg total) by mouth every 6 (six) hours as needed for mild pain. Patient taking differently: Take 500-1,500 mg by mouth every 6 (six) hours as needed for mild pain (pain score 1-3). 12/01/22   Jadine Toribio SQUIBB, MD  diclofenac  (VOLTAREN ) 75 MG EC tablet Take 1 tablet (75 mg total) by mouth 2 (two) times daily as needed with food 06/28/23     dicyclomine  (BENTYL ) 20 MG tablet Take 1 tablet (20 mg total) by mouth 3 (three) times daily as needed for spasms. 06/08/23   Craig Alan SAUNDERS,  PA-C  DULoxetine  (CYMBALTA ) 60 MG capsule Take 1 capsule (60 mg total) by mouth at bedtime. 05/12/23   Rice, Lonni ORN, MD  famotidine  (PEPCID ) 20 MG tablet Take 1 tablet (20 mg total) by mouth 2 (two) times daily. 07/13/23   Federico Rosario BROCKS, MD  gabapentin  (NEURONTIN ) 300 MG capsule Take 1 capsule (300 mg total) by mouth 2 (two) times daily. 05/24/23     hydrocortisone  (ANUSOL -HC) 2.5 % rectal cream Place 1 application rectally 2 (two) times daily for 7 days. 07/13/23   Federico Rosario BROCKS, MD  leflunomide  (ARAVA ) 20 MG tablet Take 1 tablet (20 mg total) by mouth at bedtime. 05/12/23   Jeannetta Lonni ORN, MD  pantoprazole  (PROTONIX ) 40 MG tablet Take 1 tablet (40 mg total) by mouth 2 (two) times daily before a meal. 06/08/23 08/07/23  Craig Alan SAUNDERS, PA-C  tiZANidine  (ZANAFLEX ) 4 MG tablet Take 1 tablet (4 mg total) by mouth 2 (two) times daily as needed. 06/28/23     Tocilizumab  (ACTEMRA  ACTPEN) 162 MG/0.9ML SOAJ Inject 162 mg into the skin every 14 (fourteen) days. 07/21/23   Jeannetta Lonni ORN, MD  Vitamin D , Ergocalciferol , 50000 units CAPS Take 1 capsule by mouth every 7 (seven) days. 05/14/23       Family History Family History  Problem Relation Age of Onset   Lupus Mother    Diabetes Father    Hypertension Other    Colon cancer Neg Hx    Colon polyps Neg Hx    Stomach cancer Neg Hx     Social History Social History   Tobacco Use   Smoking status: Former    Current packs/day: 0.00    Average packs/day: 0.3 packs/day for 7.0 years (1.8 ttl pk-yrs)    Types: Cigarettes    Start date: 02/17/2012    Quit date: 02/17/2019    Years since quitting: 4.4    Passive exposure: Current   Smokeless tobacco: Never  Vaping Use   Vaping status: Some Days   Substances: Nicotine   Substance Use Topics   Alcohol use: Not Currently    Comment: social   Drug use: Not Currently    Frequency: 7.0 times per week    Types: Marijuana     Allergies   Coconut flavoring agent  (non-screening)   Review of Systems Review of Systems   Physical Exam Triage Vital Signs ED Triage Vitals  Encounter Vitals Group     BP 08/15/23 1259 (!) 153/115     Systolic BP Percentile --      Diastolic BP Percentile --      Pulse Rate 08/15/23 1259 (!) 105     Resp 08/15/23 1259 18     Temp 08/15/23 1259 98.1 F (36.7 C)     Temp Source 08/15/23 1259 Oral     SpO2 08/15/23 1259 93 %  Weight --      Height --      Head Circumference --      Peak Flow --      Pain Score 08/15/23 1300 7     Pain Loc --      Pain Education --      Exclude from Growth Chart --    No data found.  Updated Vital Signs BP (!) 153/115 (BP Location: Right Arm)   Pulse (!) 105   Temp 98.1 F (36.7 C) (Oral)   Resp 18   SpO2 93%   Visual Acuity Right Eye Distance:   Left Eye Distance:   Bilateral Distance:    Right Eye Near:   Left Eye Near:    Bilateral Near:     Physical Exam Constitutional:      Appearance: She is ill-appearing.  HENT:     Head:     Comments: Pain on palpation of the right cheekbone radiating to the right ear.    Left Ear: Swelling present. There is mastoid tenderness.     Ears:     Comments: Patient has swelling of the left external ear with a noted abscess behind the ear drainage of serosanguineous purulent material. Pain noted on the right ear secondary to the pain at the zygomatic process from unresolved abscess    Mouth/Throat:     Pharynx: Posterior oropharyngeal erythema present.  Neurological:     Mental Status: She is alert.     Motor: Weakness present.      UC Treatments / Results  Labs (all labs ordered are listed, but only abnormal results are displayed) Labs Reviewed - No data to display  EKG   Radiology No results found.  Procedures Procedures (including critical care time)  Medications Ordered in UC Medications - No data to display  Initial Impression / Assessment and Plan / UC Course  I have reviewed the triage vital  signs and the nursing notes.  Pertinent labs & imaging results that were available during my care of the patient were reviewed by me and considered in my medical decision making (see chart for details).   Patient was recently treated for a facial abscess.  On exam patient continues to have the right facial tenderness and pain.  She now has a new abscess area behind her left ear and external ear is swollen.  On deep breathing she has diminished breath sounds.  She does have intermittent dizziness at this time. With increased generalized pain 9/10 on the palpation of right face and left ear.   Blood pressure may be elevated secondary to her current pain or may be related to worsening infection.  She is immune compromised as she is on a biologic for her RA.  It is recommended for her to go to the ER for further workup for facial pain and abscess.  She is agreeable to the plan.     Final Clinical Impressions(s) / UC Diagnoses   Final diagnoses:  Facial cellulitis  Essential hypertension     Discharge Instructions      There was concern for failed antibiotic therapy for your facial abscess.  You have a new abscess behind your left ear.  You may need a head CT for further evaluation along with blood work.  Your blood pressure may be elevated secondary to your current increased pain.     ED Prescriptions   None    PDMP not reviewed this encounter.   Kea Callan, Marval HERO, NP  08/15/23 1405  

## 2023-08-15 NOTE — ED Notes (Signed)
 Patient is being discharged from the Urgent Care and sent to the Emergency Department via POV . Per provider, patient is in need of higher level of care due to facial abscess and dizzness. Patient is aware and verbalizes understanding of plan of care.  Vitals:   08/15/23 1259  BP: (!) 153/115  Pulse: (!) 105  Resp: 18  Temp: 98.1 F (36.7 C)  SpO2: 93%

## 2023-08-16 ENCOUNTER — Other Ambulatory Visit (HOSPITAL_COMMUNITY): Payer: Self-pay

## 2023-08-17 ENCOUNTER — Other Ambulatory Visit (HOSPITAL_COMMUNITY): Payer: Self-pay

## 2023-08-17 ENCOUNTER — Telehealth: Payer: Self-pay | Admitting: Hematology

## 2023-08-17 NOTE — Telephone Encounter (Signed)
 Left patient a voicemail in regards to scheduled appointment time/dates left callback for scheduling if needed

## 2023-08-18 ENCOUNTER — Other Ambulatory Visit (HOSPITAL_COMMUNITY): Payer: Self-pay

## 2023-08-18 MED ORDER — TRAMADOL HCL 50 MG PO TABS
50.0000 mg | ORAL_TABLET | Freq: Two times a day (BID) | ORAL | 0 refills | Status: DC
  Filled 2023-08-18: qty 10, 5d supply, fill #0

## 2023-08-18 MED ORDER — LOSARTAN POTASSIUM 50 MG PO TABS
50.0000 mg | ORAL_TABLET | Freq: Every day | ORAL | 2 refills | Status: DC
  Filled 2023-08-18: qty 30, 30d supply, fill #0
  Filled 2023-09-19: qty 30, 30d supply, fill #1
  Filled 2024-01-31: qty 30, 30d supply, fill #2

## 2023-08-18 MED ORDER — AMLODIPINE BESYLATE 5 MG PO TABS
ORAL_TABLET | ORAL | 0 refills | Status: DC
  Filled 2023-08-18: qty 1, 1d supply, fill #0
  Filled 2023-09-01 – 2024-01-31 (×5): qty 1, fill #0

## 2023-08-18 MED ORDER — AMLODIPINE BESYLATE 5 MG PO TABS
5.0000 mg | ORAL_TABLET | Freq: Every day | ORAL | 2 refills | Status: DC
  Filled 2023-08-18: qty 30, 30d supply, fill #0
  Filled 2023-09-19: qty 30, 30d supply, fill #1
  Filled 2023-10-20: qty 30, 30d supply, fill #2

## 2023-08-24 ENCOUNTER — Other Ambulatory Visit: Payer: Self-pay

## 2023-08-31 NOTE — Assessment & Plan Note (Signed)
cT2N0Mx, with hypermetabolic left axillary nodes, HPV(+) -presented with worsening rectal bleeding, began passing clots and was admitted 02/06/22 for anemia from blood loss. S/p hemorrhoidectomy on 02/08/22 by Dr. 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 -PET scan 02/26/22 showed: intense uptake to anus; no signs of solid organ or FDG-avid nodal metastasis within abdomen or pelvis, hypermetabolic left axillary lymph nodes.  Sequently left axillary lymph node biopsy was benign. -she established care with Dr. Cliffton Asters on 03/03/22. -s/p concurrent chemoRT with mitomycin/5FU 03/09/22 - 04/20/22, tolerated moderately well with significant rectal pain. -Restaging PET scan on July 08, 2022 showed persistent increased radiotracer uptake associated with the anus, suspicious for residual disease.  She also had persistent significant anal pain.  She underwent exam and a biopsy by Dr. Cliffton Asters in OR on July 21, 2022, which was negative for residual disease. -Her rectal pain has resolved, no clinical concern for residual cancer -I reviewed her separating CT scan from January 07, 2023, which showed new enlarged right and left axillary and subpectoral lymph nodes, and numerous mildly enlarged retroperitoneal and right iliac lymph nodes, just stable from last CT scan.  She does have rheumatoid arthritis with diffuse joint pain, her adenopathy are certainly possible reactive, however given her history of anal cancer, I will obtain a PET scan for further evaluation, to see if any need biopsy one of her lymph nodes. -PET 02/05/23 showed persistent increased radiotracer uptake associated with the anus compatible with known anal cancer. Hypermetabolic bilateral axillary, bilateral subpectoral, right supraclavicular, retroperitoneal and right iliac side chain lymph nodes concerning for nodal metastatic disease. -I recommend IR node biopsy,  she agrees  -she followed with Dr. Cliffton Asters and had anal scopy exam under anesthesia which was negative for residual cancer

## 2023-08-31 NOTE — Progress Notes (Signed)
Office Visit Note  Patient: Tammy Garrison             Date of Birth: [birth year]           MRN: [id number]             PCP: [facility] Referring: [facility] Visit Date: 09/14/2023   Subjective:  Follow-up   Discussed the use of AI scribe software for clinical note transcription with the patient, who gave verbal consent to proceed.  History of Present Illness   Tammy Garrison is a 33 y.o. 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    She experiences persistent joint pain and swelling despite treatment with Actemra injections. She continues to take leflunomide. There has been no significant improvement in her inflammation. Her pain is particularly severe in her foot, hand, and knee. Her knee is described as 'really swollen' with significant pain at night, especially if kept straight for long periods, making it difficult to bend. Her left wrist is also swollen, and she cannot wear socks for long periods due to swelling in her foot, which makes walking difficult.  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 Pain medications are ineffective, particularly at night, leading to sleeplessness. No issues have been noted at the injection sites.  She often gets sick after taking Actemra, including catching a virus shortly after her last dose, which left her unwell for a week. She has not required steroids for recent infections but has been treated with antibiotics. No rashes or issues at the injection sites. Swelling is noted in multiple joints, including her left wrist and knees, with the left knee being less affected than the right.   Previous HPI 07/12/2023 Tammy Garrison is a 33 y.o. 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 She reports persistent pain, particularly in  the hands, which has limited their ability to perform tasks requiring manual dexterity. They also note improvement in their leg strength, as they are now able to ambulate without a cane. Despite this, they report persistent swelling in their left foot, which occasionally prevents them from wearing socks.   To manage the ongoing pain, their primary care physician prescribed tizanidine, which has helped with mobility but has not improved their hand function. The patient also reports numbness in their hands and pain in their wrists. They have been experiencing pain in their shoulders when they sleep on their sides and have therefore been trying to sleep on their back.   The patient also reports swelling and pain in their left foot, particularly when moving it side to side and up and down. They deny any pain in their toes.   In addition to these symptoms, the patient has been experiencing issues with their colon and is scheduled for a colonoscopy to investigate potential ulcers. They have been taking Tylenol and nsaids, most recently oral diclofenac for pain management, which they believe may have contributed to the potential ulcers. They deny any history of diverticulitis or blood clots.     Previous HPI 05/12/2023 Tammy Garrison is a 33 y.o. 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  After last visit on the prednisone taper she felt a good improvement in symptoms while taking 60 mg and then 40 mg  of prednisone but started to get more pain and swelling back at 20 mg and less.  So far not sure if the Cymbalta and leflunomide is making a big impact on her symptoms.  Still has pretty severe pain with any pressure and very limited in her activities.  Her friend is needing to help out up to an hour every day with basic daily activities.  She has not noticed a difference with starting the low-dose Cymbalta but no particular side effect.  She went for exam for nodule near  the anus with biopsy pathology report was consistent for thrombosed hemorrhoid.   Previous HPI 04/07/2023 Tammy Garrison is a 33 y.o. 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  She developed an outbreak of painful vesicular rashes on gentle seen at the emergency department diagnosis for genital herpes outbreak with concern about this triggered by her Harriette Ohara and steroid treatment.  She took oral antibiotics course and valacyclovir with resolution of this outbreak.  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  When on the prednisone taper at 60 and 40 mg had significant improvement. Since decreasing to 20 mg and stopping she has swelling and severe pain and stiffness again.  This interferes with her basic activities of daily living such as getting dressed and she is using a cane for support while walking.  Also currently feels some exacerbation of her anxiety and depressive symptoms off her mirtazapine for the past month has not been to get back in with a primary care office yet for maintenance medication refills.   Previous HPI 02/24/2023 Tammy Garrison is a 33 y.o. 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  She saw partial benefit when on the 30 mg prednisone but has really not been doing well at any point since her last visit.  Still with joint pain and swelling in multiple areas.  Has trouble standing up due to leg pains and cannot use her arms to push off easily due to her pain especially left elbow.  Over the interval she had follow-up scans with oncology including PET scan on July 5 this was concerning for hypermetabolic regional lymph nodes around the associated anal squamous cell cancer.  She was recommended for new scope and biopsy  has not yet been established went over with him so far.  Currently symptoms completely prevent her from working and are limiting even for her ADLs requiring assistance to get back and forth to the bathroom and move around the house.   Previous HPI 12/25/22 Tammy Garrison is a 33 y.o. female here for evaluation and management of seropositive rheumatoid arthritis.  Originally diagnosed with onset of symptoms and 2019 and saw Dr. Kathi Ludwig for this.  She was initially treated with methotrexate which was ineffective subsequently treated with Harriette Ohara with a good improvement.  She discontinued treatment in 2021 due to cost being prohibitive also was in disease remission at the time.  She continued to do well off of any maintenance therapy until last year after diagnosis with anal carcinoma then treated with concurrent chemoradiation therapy and redeveloped significant joint inflammation in multiple areas.  Her treatments and symptoms that put her out of work most of the past year.  The past few months joint  inflammation is getting worse has been seen at the hospital multiple times and treated with prolonged steroid tapers twice in February and April.  She see symptom relief when started on the high-dose steroids typically with some starting to return near the end of the taper and then gets a severe flare again within a few days of discontinuing medicine.  Currently she is off any steroids for 4 days and has severe joint pain and swelling in multiple areas. She also has hidradenitis suppurativa with involvement of the axillary, intertriginous, and groin areas.  No current open cysts or drainage.  Does notice some hardened mildly tender area along the left lower breast.  She was never on prolonged antibiotics and does not see dermatology for this condition.  HS symptoms were also quiescent during the few years off treatment before her cancer last year. No history of frequent respiratory or urinary tract  infections.  She has had problems with bleeding and blood clots associated with the anal cancer but not prior to this.  No shortness of breath or persistent cough.   DMARD Hx Harriette Ohara - stopped 2021 due to cost and disease remission MTX - nonresponder   Review of Systems  Constitutional:  Positive for fatigue.  HENT:  Negative for mouth sores and mouth dryness.   Eyes:  Negative for dryness.  Respiratory:  Positive for shortness of breath.   Cardiovascular:  Positive for chest pain. Negative for palpitations.  Gastrointestinal:  Positive for blood in stool. Negative for constipation and diarrhea.  Endocrine: Negative for increased urination.  Genitourinary:  Positive for involuntary urination.  Musculoskeletal:  Positive for joint pain, gait problem, joint pain, joint swelling, myalgias, muscle weakness, morning stiffness, muscle tenderness and myalgias.  Skin:  Positive for color change and sensitivity to sunlight. Negative for rash and hair loss.  Allergic/Immunologic: Positive for susceptible to infections.  Neurological:  Positive for dizziness and headaches.  Hematological:  Positive for swollen glands.  Psychiatric/Behavioral:  Positive for depressed mood and sleep disturbance. The patient is nervous/anxious.     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   Bipolar 1 disorder (HCC) 02/23/2022   RA (rheumatoid  arthritis) (HCC) 02/07/2022   Syncope 02/06/2022   ABLA (acute blood loss anemia) 02/06/2022   BRBPR (bright red blood per rectum) 02/06/2022   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   Depression 05/20/2016   Marijuana dependence (HCC) 05/19/2016    Past Medical History:  Diagnosis Date   Anemia    Bipolar 1 disorder (HCC)    Cancer (HCC)    squamous cell rectal cancer   Diabetes (HCC)    PREDIABETIC   ETOH abuse    H/O self-harm    H/O suicide attempt    x 5 - last 05/2016 - overdose Ibuprofen   Marijuana abuse    Rheumatoid arthritis (HCC)     Family History  Problem Relation Age of Onset  Lupus Mother    Diabetes Father    Hypertension Other    Colon cancer Neg Hx    Colon polyps Neg Hx    Stomach cancer Neg Hx    Past Surgical History:  Procedure Laterality Date   HEMORRHOID SURGERY N/A 02/08/2022   Procedure: EXTERNAL AND INTERNAL HEMORRHOIDECTOMY;  Surgeon: Violeta Gelinas, MD;  Location: [facility];  Service: General;  Laterality: N/A;   INCISION AND DRAINAGE ABSCESS Right 04/22/2022   Procedure: INCISION AND DRAINAGE axilla;  Surgeon: Sheliah Hatch, De Blanch, MD;  Location: [facility] ORS;  Service: General;  Laterality: Right;   RECTAL BIOPSY N/A 05/10/2023   Procedure: EXCISION OF LEFT PERIANAL NODULE;  Surgeon: Andria Meuse, MD;  Location: [facility] OR;  Service: General;  Laterality: N/A;   RECTAL EXAM UNDER ANESTHESIA N/A 07/21/2022   Procedure: RECTAL EXAM UNDER ANESTHESIA;  Surgeon: Andria Meuse, MD;  Location: [facility] OR;  Service: General;  Laterality: N/A;   RECTAL EXAM UNDER ANESTHESIA N/A 05/10/2023   Procedure: RECTAL EXAM UNDER ANESTHESIA;  Surgeon: Andria Meuse, MD;  Location: [facility] OR;  Service: General;  Laterality: N/A;  60   TRANSANAL HEMORRHOIDAL DEARTERIALIZATION N/A 07/21/2022   Procedure: ANAL CANAL BIOPSY;  Surgeon: Andria Meuse, MD;  Location: [facility] OR;  Service: General;  Laterality: N/A;   Social History    Social History Narrative   Not on file    There is no immunization history on file for this patient.   Objective: Vital Signs: BP (!) 134/97 (BP Location: Left Arm, Patient Position: Sitting, Cuff Size: Large)   Pulse (!) 105   Resp 14   Ht 6' (1.829 m)   Wt 260 lb (117.9 kg)   BMI 35.26 kg/m    Physical Exam Constitutional:      Appearance: She is obese.  Eyes:     Conjunctiva/sclera: Conjunctivae normal.  Cardiovascular:     Rate and Rhythm: Regular rhythm. Tachycardia present.  Pulmonary:     Effort: Pulmonary effort is normal.     Breath sounds: Normal breath sounds.  Musculoskeletal:     Right lower leg: No edema.     Left lower leg: No edema.  Lymphadenopathy:     Cervical: No cervical adenopathy.  Skin:    General: Skin is warm and dry.  Neurological:     Mental Status: She is alert.  Psychiatric:        Mood and Affect: Mood normal.      Musculoskeletal Exam:  Left elbow tenderness with no swelling B/l wrist swelling present, decreased ROM tolerated, tenderness to pressure without radiation Fingers full ROM, tenderness across MCP joints without palpable synovitis Knees full ROM no tenderness or swelling Left ankle swelling and warmth, tenderness to pressure and with movement which is restricted Left 1st MTP swelling and tenderness   Investigation: No additional findings.  Imaging: DG Chest 2 View Result Date: 08/15/2023 CLINICAL DATA:  Chest pain.  Elevated blood pressure. EXAM: CHEST - 2 VIEW COMPARISON:  Radiographs 11/29/2022 FINDINGS: The cardiomediastinal contours are normal. The lungs are clear. Pulmonary vasculature is normal. No consolidation, pleural effusion, or pneumothorax. No acute osseous abnormalities are seen. IMPRESSION: No active cardiopulmonary disease. Electronically Signed   By: Narda Rutherford M.D.   On: 08/15/2023 16:44    Recent Labs: Lab Results  Component Value Date   WBC 4.3 08/15/2023   HGB 12.5 08/15/2023   PLT 337  08/15/2023   NA 138 08/15/2023   K 3.8 08/15/2023  CL 104 08/15/2023   CO2 24 08/15/2023   GLUCOSE 98 08/15/2023   BUN 9 08/15/2023   CREATININE 0.56 08/15/2023   BILITOT 0.5 08/15/2023   ALKPHOS 106 08/15/2023   AST 24 08/15/2023   ALT 26 08/15/2023   PROT 8.2 (H) 08/15/2023   ALBUMIN 4.0 08/15/2023   CALCIUM 9.5 08/15/2023   GFRAA >60 04/12/2018   QFTBGOLDPLUS NEGATIVE 12/25/2022    Speciality Comments: No specialty comments available.  Procedures:  No procedures performed Allergies: Coconut flavoring agent (non-screening)   Assessment / Plan:     Visit Diagnoses: Rheumatoid arthritis involving multiple sites, unspecified whether rheumatoid factor present (HCC) - Plan: Sedimentation rate, leflunomide (ARAVA) 20 MG tablet Persistent inflammation and pain despite Actemra injections and Leflunomide. Appears to be in high disease activity. Patient reports increased infections after Actemra injections and no significant improvement in symptoms. Noted swelling in multiple joints including the knees, wrist, and foot. -Check inflammatory markers today (last sedimentation rate was 118). -Consider switching to Rinvoq if no significant improvement in inflammatory markers and clinical symptoms. Discussed potential side effects including increased risk of herpes virus reactivation. Also risk of immunosuppression in setting of squamous cell cancer -Consider Aciclovir if Rinvoq initiated due to history of HSV.  High risk medication use - Actemra 162 mg [place] q14days, leflunomide 20 mg daily for now - Plan: CBC with Differential/Platelet, COMPLETE METABOLIC PANEL WITH GFR, Lipid panel Reporting good tolerance of injections but concerned for increased frequency of infections. -Checking CBC, CMP, and lipid panel medication monitoring now on actemra  Anal squamous cell carcinoma (HCC) Pending CT scan tomorrow. Also with some abdominal pain but seems unrelated. Ongoing follow up wit Dr. Mosetta Putt  regarding dMARD safety/monitoring. -Follow up on results of CT scan.  Chronic pain syndrome - Plan: DULoxetine (CYMBALTA) 60 MG capsule    Orders: Orders Placed This Encounter  Procedures   Sedimentation rate   CBC with Differential/Platelet   COMPLETE METABOLIC PANEL WITH GFR   Lipid panel   Meds ordered this encounter  Medications   DULoxetine (CYMBALTA) 60 MG capsule    Sig: Take 1 capsule (60 mg total) by mouth at bedtime.    Dispense:  90 capsule    Refill:  0   leflunomide (ARAVA) 20 MG tablet    Sig: Take 1 tablet (20 mg total) by mouth at bedtime.    Dispense:  90 tablet    Refill:  0     Follow-Up Instructions: Return in about 6 weeks (around 10/26/2023) for RA on TOC f/u 6wks.   Fuller Plan, MD  Note - This record has been created using [employer].  Chart creation errors have been sought, but may not always  have been located. Such creation errors do not reflect on  the standard of medical care.

## 2023-09-01 ENCOUNTER — Other Ambulatory Visit: Payer: Self-pay | Admitting: Internal Medicine

## 2023-09-01 ENCOUNTER — Other Ambulatory Visit (HOSPITAL_COMMUNITY): Payer: Self-pay

## 2023-09-01 ENCOUNTER — Other Ambulatory Visit: Payer: Self-pay

## 2023-09-01 ENCOUNTER — Inpatient Hospital Stay: Payer: Medicaid Other | Attending: Hematology | Admitting: Hematology

## 2023-09-01 ENCOUNTER — Encounter: Payer: Self-pay | Admitting: Hematology

## 2023-09-01 VITALS — BP 134/98 | HR 105 | Temp 97.4°F | Resp 18 | Wt 244.4 lb

## 2023-09-01 DIAGNOSIS — Z85048 Personal history of other malignant neoplasm of rectum, rectosigmoid junction, and anus: Secondary | ICD-10-CM | POA: Insufficient documentation

## 2023-09-01 DIAGNOSIS — C210 Malignant neoplasm of anus, unspecified: Secondary | ICD-10-CM

## 2023-09-01 DIAGNOSIS — M069 Rheumatoid arthritis, unspecified: Secondary | ICD-10-CM | POA: Insufficient documentation

## 2023-09-01 MED ORDER — GABAPENTIN 300 MG PO CAPS
300.0000 mg | ORAL_CAPSULE | Freq: Two times a day (BID) | ORAL | 5 refills | Status: DC
  Filled 2023-09-01: qty 60, 30d supply, fill #0
  Filled 2023-09-27: qty 60, 30d supply, fill #1
  Filled 2023-11-02: qty 60, 30d supply, fill #2
  Filled 2023-11-29 – 2023-12-01 (×2): qty 60, 30d supply, fill #3
  Filled 2024-01-02: qty 60, 30d supply, fill #4
  Filled 2024-01-24 – 2024-01-31 (×2): qty 60, 30d supply, fill #5

## 2023-09-01 MED ORDER — LEFLUNOMIDE 20 MG PO TABS
20.0000 mg | ORAL_TABLET | Freq: Every day | ORAL | 0 refills | Status: DC
  Filled 2023-09-01: qty 30, 30d supply, fill #0

## 2023-09-01 NOTE — Progress Notes (Signed)
[facility]   Telephone:(336) [phone] Fax:(336) [phone]   Clinic Follow up Note   Patient Care Team: Pa, [facility] as PCP - General (Internal Medicine) Rossie Muskrat, MD (Rheumatology) Ranae Pila, MD as Consulting Physician (Obstetrics and Gynecology) Malachy Mood, MD as Consulting Physician (Hematology and Oncology)  Date of Service:  09/01/2023  CHIEF COMPLAINT: f/u of anal cancer   CURRENT THERAPY: Cancer surveillance  Oncology History   Anal squamous cell carcinoma (HCC) cT2N0Mx, with hypermetabolic left axillary nodes, HPV(+) -presented with worsening rectal bleeding, began passing clots and was admitted 02/06/22 for anemia from blood loss. S/p hemorrhoidectomy on 02/08/22 by Dr. 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 -PET scan 02/26/22 showed: intense uptake to anus; no signs of solid organ or FDG-avid nodal metastasis within abdomen or pelvis, hypermetabolic left axillary lymph nodes.  Sequently left axillary lymph node biopsy was benign. -s/p concurrent chemoRT with mitomycin/5FU 03/09/22 - 04/20/22, tolerated moderately well with significant rectal pain. -Restaging PET scan on July 08, 2022 showed persistent increased radiotracer uptake associated with the anus, suspicious for residual disease.  She also had persistent significant anal pain.  She underwent exam and a biopsy by Dr. Cliffton Asters in OR on July 21, 2022, which was negative for  -I reviewed her separating CT scan from January 07, 2023, which showed new enlarged right and left axillary and subpectoral lymph nodes, and numerous mildly enlarged retroperitoneal and right iliac lymph nodes, just stable from last CT scan.  She does have rheumatoid arthritis with diffuse joint pain, her adenopathy are certainly possible reactive, however given her history of anal cancer, I will obtain a PET scan for  further evaluation, to see if any need biopsy one of her lymph nodes. -PET 02/05/23 showed persistent increased radiotracer uptake associated with the anus compatible with known anal cancer. Hypermetabolic bilateral axillary, bilateral subpectoral, right supraclavicular, retroperitoneal and right iliac side chain lymph nodes concerning for nodal metastatic disease. -I recommend IR node biopsy, she agreed but did not proceed  -she followed with Dr. Cliffton Asters and had anal scopy exam under anesthesia in 05/2023 which was negative for residual cancer     Assessment and Plan    Anal Cancer Follow-up for anal cancer diagnosed in July 2023. Recent exam by Dr. Cliffton Asters was negative. No rectal bleeding or pain for the past two weeks. Regular bowel movements without straining. Physical rectal exam today was attempted but painful; no external abnormalities noted. Discussed importance of regular follow-ups and scans to monitor for recurrence. Explained that follow-ups may become less frequent over time. Patient consented to a CT scan next month and follow-up with Dr. Cliffton Asters for endoscopy. - Order CT scan to be done in next month - Follow-up with Dr. Cliffton Asters for endoscopy within the next month or two - Review CT scan results and follow-up in three months  Rheumatoid Arthritis Ongoing management of rheumatoid arthritis with Dr. Cliffton Asters. Currently on primidone and Humira injections. Persistent joint pain but able to manage daily activities. Discussed current treatment regimen and need for ongoing management with Dr. Cliffton Asters. - Continue primidone and Humira - Follow-up with Dr. Cliffton Asters for ongoing management  Plan -She is clinically doing well, no concern for recurrence -She is due for restaging scan, I ordered a CT chest, abdomen pelvis with contrast, to be done in the next 2 to 3 weeks - Schedule follow-up appointment in three  months -I encouraged her to follow-up with Dr. Cliffton Asters in the next few months      SUMMARY OF  ONCOLOGIC HISTORY: Oncology History Overview Note   Cancer Staging  Anal squamous cell carcinoma (HCC) Staging form: Anus, AJCC V9 - Clinical stage from 02/08/2022: Stage IIA (cT2, cN0, cM0) - Signed by Malachy Mood, MD on 02/23/2022 Stage prefix: Initial diagnosis     Anal squamous cell carcinoma (HCC)  02/08/2022 Pathology Results   FINAL MICROSCOPIC DIAGNOSIS:   A. SOFT TISSUE, ANTERIOR MASS VS. HEMORRHOID, EXCISION:  Invasive squamous cell carcinoma, focally keratinizing and  well-differentiated.  Carcinoma is present at the margins of excision.   B. HEMORRHOID, INTERNAL, EXCISION:  Hemorrhoid without the overlying epithelium.  Separate fragment of benign squamous epithelium.  Negative for neoplasm.   ADDENDUM:  P16 immunostain is strongly positive suggestive of this lesion being the HPV related.    02/08/2022 Cancer Staging   Staging form: Anus, AJCC V9 - Clinical stage from 02/08/2022: Stage IIA (cT2, cN0, cM0) - Signed by Malachy Mood, MD on 02/23/2022 Stage prefix: Initial diagnosis   02/23/2022 Initial Diagnosis   Anal cancer (HCC)   03/09/2022 - 03/13/2022 Chemotherapy   Patient is on Treatment Plan : ANUS Mitomycin D1,28 / 5FU D1-4, 28-31 q32d     03/09/2022 -  Chemotherapy   Patient is on Treatment Plan : ANUS Mitomycin D1,28 + 5FU D1-4, 28-31 q32d      PET scan   IMPRESSION: 1. There is persistent increased radiotracer uptake associated with the anus. The degree of uptake is similar to the previous exam. Cannot exclude residual FDG avid tumor. 2. No signs of solid organ or nodal metastasis within the chest, abdomen or pelvis. 3. Interval resolution of previous FDG avid left axillary lymph nodes. 4. Interval increase in radiotracer uptake associated with anterior mediastinal soft tissue. The configuration of the soft tissue is consistent with the thymus gland. 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 5.  Interval development of multiple FDG avid cutaneous soft tissue nodules within the anterior left shoulder, ventral chest wall, and anteromedial left thigh. These are nonspecific and may be inflammatory or infectious in etiology. Metastatic disease is considered less favored. Correlation with physical exam findings is advised. 6. Enlarged uterus containing degenerating fibroids.      Discussed the use of AI scribe software for clinical note transcription with the patient, who gave verbal consent to proceed.  History of Present Illness   The patient, a 33 year old female with a history of anal cancer, presents for a follow-up visit. She reports that she has been feeling well overall, with no recurrence of symptoms related to her anal cancer. She has not experienced any bleeding for the past two weeks and reports regular bowel movements without any straining. She also reports no pain. She has been seeing another doctor for arthritis, which is her main health concern at the moment. She has been managing her arthritis with medication, but it still affects her daily activities. She also mentions that she has been evicted from her home due to financial difficulties following her cancer treatment and is currently staying with a friend.         All other systems were reviewed with the patient and are negative.  MEDICAL HISTORY:  Past Medical History:  Diagnosis Date   Anemia    Bipolar 1 disorder (HCC)    Cancer (HCC)    squamous cell rectal  cancer   Diabetes (HCC)    PREDIABETIC   ETOH abuse    H/O self-harm    H/O suicide attempt    x 5 - last 05/2016 - overdose Ibuprofen   Marijuana abuse    Rheumatoid arthritis (HCC)     SURGICAL HISTORY: Past Surgical History:  Procedure Laterality Date   HEMORRHOID SURGERY N/A 02/08/2022   Procedure: EXTERNAL AND INTERNAL HEMORRHOIDECTOMY;  Surgeon: Violeta Gelinas, MD;  Location: [facility] OR;  Service: General;  Laterality: N/A;   INCISION AND DRAINAGE  ABSCESS Right 04/22/2022   Procedure: INCISION AND DRAINAGE axilla;  Surgeon: Sheliah Hatch, De Blanch, MD;  Location: [facility] ORS;  Service: General;  Laterality: Right;   RECTAL BIOPSY N/A 05/10/2023   Procedure: EXCISION OF LEFT PERIANAL NODULE;  Surgeon: Andria Meuse, MD;  Location: [facility] OR;  Service: General;  Laterality: N/A;   RECTAL EXAM UNDER ANESTHESIA N/A 07/21/2022   Procedure: RECTAL EXAM UNDER ANESTHESIA;  Surgeon: Andria Meuse, MD;  Location: [facility] OR;  Service: General;  Laterality: N/A;   RECTAL EXAM UNDER ANESTHESIA N/A 05/10/2023   Procedure: RECTAL EXAM UNDER ANESTHESIA;  Surgeon: Andria Meuse, MD;  Location: [facility] OR;  Service: General;  Laterality: N/A;  60   TRANSANAL HEMORRHOIDAL DEARTERIALIZATION N/A 07/21/2022   Procedure: ANAL CANAL BIOPSY;  Surgeon: Andria Meuse, MD;  Location: [facility] OR;  Service: General;  Laterality: N/A;    I have reviewed the social history and family history with the patient and they are unchanged from previous note.  ALLERGIES:  is allergic to coconut flavoring agent (non-screening).  MEDICATIONS:  Current Outpatient Medications  Medication Sig Dispense Refill   acetaminophen (TYLENOL) 325 MG tablet Take 2 tablets (650 mg total) by mouth every 6 (six) hours as needed for mild pain. (Patient taking differently: Take 500-1,500 mg by mouth every 6 (six) hours as needed for mild pain (pain score 1-3).)     amLODipine (NORVASC) 5 MG tablet Take 1 tablet (5 mg total) by mouth daily. 30 tablet 2   amLODipine (NORVASC) 5 MG tablet PLEASE DISREGARD 1 tablet 0   amoxicillin-clavulanate (AUGMENTIN) 875-125 MG tablet Take 1 tablet by mouth every 12 (twelve) hours. 19 tablet 0   diclofenac (VOLTAREN) 75 MG EC tablet Take 1 tablet (75 mg total) by mouth 2 (two) times daily as needed with food 60 tablet 2   dicyclomine (BENTYL) 20 MG tablet Take 1 tablet (20 mg total) by mouth 3 (three) times daily as needed for spasms. 50 tablet 0   doxycycline  (VIBRAMYCIN) 100 MG capsule Take 1 capsule (100 mg total) by mouth 2 (two) times daily. 19 capsule 0   DULoxetine (CYMBALTA) 60 MG capsule Take 1 capsule (60 mg total) by mouth at bedtime. 30 capsule 2   famotidine (PEPCID) 20 MG tablet Take 1 tablet (20 mg total) by mouth 2 (two) times daily. 60 tablet 3   gabapentin (NEURONTIN) 300 MG capsule Take 1 capsule (300 mg total) by mouth 2 (two) times daily. 60 capsule 5   hydrocortisone (ANUSOL-HC) 2.5 % rectal cream Place 1 application rectally 2 (two) times daily for 7 days. 30 g 1   leflunomide (ARAVA) 20 MG tablet Take 1 tablet (20 mg total) by mouth at bedtime. 30 tablet 0   losartan (COZAAR) 50 MG tablet Take 1 tablet (50 mg total) by mouth daily. 30 tablet 2   pantoprazole (PROTONIX) 40 MG tablet Take 1 tablet (40 mg total) by mouth 2 (two) times daily  before a meal. 60 tablet 1   tiZANidine (ZANAFLEX) 4 MG tablet Take 1 tablet (4 mg total) by mouth 2 (two) times daily as needed. 60 tablet 2   Tocilizumab (ACTEMRA ACTPEN) 162 MG/0.9ML SOAJ Inject 162 mg into the skin every 14 (fourteen) days. 1.8 mL 2   traMADol (ULTRAM) 50 MG tablet Take 1 tablet by mouth twice daily as needed for severe pain 10 tablet 0   Vitamin D, Ergocalciferol, 50000 units CAPS Take 1 capsule by mouth every 7 (seven) days. 4 capsule 5   No current facility-administered medications for this visit.   Facility-Administered Medications Ordered in Other Visits  Medication Dose Route Frequency Provider Last Rate Last Admin   0.9 %  sodium chloride infusion (Manually program via Guardrails IV Fluids)  250 mL Intravenous Once Malachy Mood, MD       heparin lock flush 100 unit/mL  250 Units Intracatheter Once Malachy Mood, MD       sodium chloride flush (NS) 0.9 % injection 10 mL  10 mL Intracatheter Once Malachy Mood, MD       sodium chloride flush (NS) 0.9 % injection 10 mL  10 mL Intracatheter Once Malachy Mood, MD       sodium chloride flush (NS) 0.9 % injection 10 mL  10 mL  Intracatheter Once Malachy Mood, MD        PHYSICAL EXAMINATION: ECOG PERFORMANCE STATUS: 1 - Symptomatic but completely ambulatory  Vitals:   09/01/23 1300 09/01/23 1301  BP: (!) 149/109 (!) 134/98  Pulse: (!) 105   Resp: 18   Temp: (!) 97.4 F (36.3 C)   SpO2: 99%    Wt Readings from Last 3 Encounters:  09/01/23 244 lb 6.4 oz (110.9 kg)  08/15/23 272 lb 14.9 oz (123.8 kg)  07/27/23 273 lb (123.8 kg)     GENERAL:alert, no distress and comfortable SKIN: skin color, texture, turgor are normal, no rashes or significant lesions EYES: normal, Conjunctiva are pink and non-injected, sclera clear NECK: supple, thyroid normal size, non-tender, without nodularity LYMPH:  no palpable lymphadenopathy in the cervical, axillary  LUNGS: clear to auscultation and percussion with normal breathing effort HEART: regular rate & rhythm and no murmurs and no lower extremity edema ABDOMEN:abdomen soft, non-tender and normal bowel sounds Musculoskeletal:no cyanosis of digits and no clubbing  NEURO: alert & oriented x 3 with fluent speech, no focal motor/sensory deficits RECTAL: External examination normal. Pain on attempted internal examination; internal examination not completed due to discomfort.      LABORATORY DATA:  I have reviewed the data as listed    Latest Ref Rng & Units 08/15/2023    2:44 PM 07/12/2023   10:51 AM 05/10/2023   12:22 PM  CBC  WBC 4.0 - 10.5 K/uL 4.3  6.3  6.0   Hemoglobin 12.0 - 15.0 g/dL 16.1  09.6  04.5   Hematocrit 36.0 - 46.0 % 39.7  34.5  35.6   Platelets 150 - 400 K/uL 337  447  374         Latest Ref Rng & Units 08/15/2023    2:44 PM 07/12/2023   10:51 AM 05/10/2023   12:22 PM  CMP  Glucose 70 - 99 mg/dL 98  409  811   BUN 6 - 20 mg/dL 9  9  7    Creatinine 0.44 - 1.00 mg/dL 9.14  7.82  9.56   Sodium 135 - 145 mmol/L 138  140  136   Potassium 3.5 -  5.1 mmol/L 3.8  3.8  3.5   Chloride 98 - 111 mmol/L 104  104  102   CO2 22 - 32 mmol/L 24  25  23    Calcium  8.9 - 10.3 mg/dL 9.5  9.6  9.1   Total Protein 6.5 - 8.1 g/dL 8.2  7.7  7.6   Total Bilirubin 0.0 - 1.2 mg/dL 0.5  0.2  0.3   Alkaline Phos 38 - 126 U/L 106   85   AST 15 - 41 U/L 24  16  19    ALT 0 - 44 U/L 26  17  34       RADIOGRAPHIC STUDIES: I have personally reviewed the radiological images as listed and agreed with the findings in the report. No results found.    Orders Placed This Encounter  Procedures   CT CHEST ABDOMEN PELVIS W CONTRAST    Standing Status:   Future    Expected Date:   09/15/2023    Expiration Date:   08/31/2024    If indicated for the ordered procedure, I authorize the administration of contrast media per Radiology protocol:   Yes    Does the patient have a contrast media/X-ray dye allergy?:   No    Is patient pregnant?:   No    Preferred imaging location?:   [facility]    If indicated for the ordered procedure, I authorize the administration of oral contrast media per Radiology protocol:   Yes   All questions were answered. The patient knows to call the clinic with any problems, questions or concerns. No barriers to learning was detected. The total time spent in the appointment was 25 minutes.     Malachy Mood, MD 09/01/2023

## 2023-09-01 NOTE — Telephone Encounter (Signed)
Last Fill: 05/12/2023  Labs: 08/15/2023  RDW 16.1 Total Protein 8.2  Next Visit: 09/14/2023  Last Visit: 07/12/2023  DX: Rheumatoid arthritis involving multiple sites, unspecified whether rheumatoid factor present   Current Dose per office note 07/12/2023: leflunomide 20 mg daily   Okay to refill Arava ?

## 2023-09-14 ENCOUNTER — Encounter: Payer: Self-pay | Admitting: Internal Medicine

## 2023-09-14 ENCOUNTER — Ambulatory Visit: Payer: Medicaid Other | Attending: Internal Medicine | Admitting: Internal Medicine

## 2023-09-14 ENCOUNTER — Other Ambulatory Visit (HOSPITAL_COMMUNITY): Payer: Self-pay

## 2023-09-14 ENCOUNTER — Other Ambulatory Visit (HOSPITAL_BASED_OUTPATIENT_CLINIC_OR_DEPARTMENT_OTHER): Payer: Self-pay

## 2023-09-14 VITALS — BP 134/97 | HR 105 | Resp 14 | Ht 72.0 in | Wt 260.0 lb

## 2023-09-14 DIAGNOSIS — G894 Chronic pain syndrome: Secondary | ICD-10-CM | POA: Diagnosis not present

## 2023-09-14 DIAGNOSIS — C210 Malignant neoplasm of anus, unspecified: Secondary | ICD-10-CM

## 2023-09-14 DIAGNOSIS — M069 Rheumatoid arthritis, unspecified: Secondary | ICD-10-CM | POA: Diagnosis not present

## 2023-09-14 DIAGNOSIS — Z79899 Other long term (current) drug therapy: Secondary | ICD-10-CM

## 2023-09-14 MED ORDER — DULOXETINE HCL 60 MG PO CPEP
60.0000 mg | ORAL_CAPSULE | Freq: Every day | ORAL | 0 refills | Status: DC
  Filled 2023-09-14: qty 90, 90d supply, fill #0

## 2023-09-14 MED ORDER — LEFLUNOMIDE 20 MG PO TABS
20.0000 mg | ORAL_TABLET | Freq: Every day | ORAL | 0 refills | Status: DC
  Filled 2023-09-14 – 2023-09-27 (×2): qty 90, 90d supply, fill #0

## 2023-09-15 ENCOUNTER — Telehealth: Payer: Self-pay | Admitting: Internal Medicine

## 2023-09-15 ENCOUNTER — Other Ambulatory Visit (HOSPITAL_COMMUNITY): Payer: Self-pay

## 2023-09-15 ENCOUNTER — Other Ambulatory Visit: Payer: Self-pay | Admitting: *Deleted

## 2023-09-15 ENCOUNTER — Ambulatory Visit (HOSPITAL_COMMUNITY)
Admission: RE | Admit: 2023-09-15 | Discharge: 2023-09-15 | Disposition: A | Discharge status: Home / Self Care | Payer: Medicaid Other | Source: Ambulatory Visit | Attending: Hematology | Admitting: Hematology

## 2023-09-15 ENCOUNTER — Encounter: Payer: Self-pay | Admitting: Hematology

## 2023-09-15 DIAGNOSIS — C210 Malignant neoplasm of anus, unspecified: Secondary | ICD-10-CM | POA: Insufficient documentation

## 2023-09-15 LAB — LIPID PANEL
Cholesterol: 196 mg/dL (ref <200)
HDL: 39 mg/dL — ABNORMAL LOW (ref >=50)
LDL Cholesterol (Calc): 130 mg/dL — ABNORMAL HIGH
Non-HDL Cholesterol (Calc): 157 mg/dL — ABNORMAL HIGH (ref <130)
Total CHOL/HDL Ratio: 5 (calc) — ABNORMAL HIGH (ref <5.0)
Triglycerides: 159 mg/dL — ABNORMAL HIGH (ref <150)

## 2023-09-15 LAB — COMPLETE METABOLIC PANEL WITH GFR
AG Ratio: 1.5 (calc) (ref 1.0–2.5)
ALT: 25 U/L (ref 6–29)
AST: 17 U/L (ref 10–30)
Albumin: 4.5 g/dL (ref 3.6–5.1)
Alkaline phosphatase (APISO): 121 U/L (ref 31–125)
BUN: 12 mg/dL (ref 7–25)
CO2: 26 mmol/L (ref 20–32)
Calcium: 9.7 mg/dL (ref 8.6–10.2)
Chloride: 103 mmol/L (ref 98–110)
Creat: 0.64 mg/dL (ref 0.50–0.97)
Globulin: 3.1 g/dL (ref 1.9–3.7)
Glucose, Bld: 97 mg/dL (ref 65–99)
Potassium: 3.9 mmol/L (ref 3.5–5.3)
Sodium: 139 mmol/L (ref 135–146)
Total Bilirubin: 0.6 mg/dL (ref 0.2–1.2)
Total Protein: 7.6 g/dL (ref 6.1–8.1)
eGFR: 122 mL/min/{1.73_m2} (ref >=60)

## 2023-09-15 LAB — CBC WITH DIFFERENTIAL/PLATELET
Absolute Lymphocytes: 1134 {cells}/uL (ref 850–3900)
Absolute Monocytes: 385 {cells}/uL (ref 200–950)
Basophils Absolute: 22 {cells}/uL (ref 0–200)
Basophils Relative: 0.6 %
Eosinophils Absolute: 184 {cells}/uL (ref 15–500)
Eosinophils Relative: 5.1 %
HCT: 39.1 % (ref 35.0–45.0)
Hemoglobin: 12.3 g/dL (ref 11.7–15.5)
MCH: 26.9 pg — ABNORMAL LOW (ref 27.0–33.0)
MCHC: 31.5 g/dL — ABNORMAL LOW (ref 32.0–36.0)
MCV: 85.4 fL (ref 80.0–100.0)
MPV: 11.5 fL (ref 7.5–12.5)
Monocytes Relative: 10.7 %
Neutro Abs: 1876 {cells}/uL (ref 1500–7800)
Neutrophils Relative %: 52.1 %
Platelets: 344 10*3/uL (ref 140–400)
RBC: 4.58 10*6/uL (ref 3.80–5.10)
RDW: 15.9 % — ABNORMAL HIGH (ref 11.0–15.0)
Total Lymphocyte: 31.5 %
WBC: 3.6 10*3/uL — ABNORMAL LOW (ref 3.8–10.8)

## 2023-09-15 LAB — SEDIMENTATION RATE: Sed Rate: 36 mm/h — ABNORMAL HIGH (ref 0–20)

## 2023-09-15 MED ORDER — PREDNISONE 20 MG PO TABS
ORAL_TABLET | ORAL | 0 refills | Status: AC
  Filled 2023-09-15: qty 24, 12d supply, fill #0

## 2023-09-15 MED ORDER — IOHEXOL 300 MG/ML  SOLN
100.0000 mL | Freq: Once | INTRAMUSCULAR | Status: AC | PRN
  Administered 2023-09-15: 100 mL via INTRAVENOUS

## 2023-09-15 NOTE — Progress Notes (Signed)
Sent prednisone RX to [facility] pharmacy

## 2023-09-15 NOTE — Addendum Note (Signed)
Addended by: Fuller Plan on: 09/15/2023 02:54 PM   Modules accepted: Orders

## 2023-09-15 NOTE — Progress Notes (Signed)
Sedimentation rate improved a lot down to 36 from 118.  Like we discussed sometimes with Actemra the numbers improved a lot faster than patient see a difference in symptoms and I would recommend continuing the medicine for now. If she wants to in the short-term, we could repeat an oral steroid taper since I am hoping on more relief with longer on Actemra. White blood cells decreased mildly down to 3.6 this is probably an effect of the medication.  Kidney and liver enzymes were normal.  Cholesterol profile looks worse compared to 6 months ago.  Total cholesterol of 196 is not too high but the LDL cholesterol went up to 130.  This particular side effect is also on that can occur with the Rinvoq.  If these remain more elevated consistently we will need to consider cholesterol medication.

## 2023-09-15 NOTE — Telephone Encounter (Signed)
Erroneous encounter

## 2023-09-17 ENCOUNTER — Other Ambulatory Visit: Payer: Self-pay

## 2023-09-17 NOTE — Progress Notes (Signed)
Specialty Pharmacy Refill Coordination Note  Tammy Garrison is a 33 y.o. female contacted today regarding refills of specialty medication(s) Tocilizumab (Actemra ACTPen)   Patient requested Delivery   Delivery date: 09/23/23   Verified address: [address] [place], [place]   Medication will be filled on 02.19.25.

## 2023-09-21 ENCOUNTER — Other Ambulatory Visit: Payer: Self-pay
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
## 2023-09-23 ENCOUNTER — Telehealth: Payer: Self-pay

## 2023-09-23 ENCOUNTER — Other Ambulatory Visit: Payer: Self-pay | Admitting: *Deleted

## 2023-09-23 NOTE — Progress Notes (Signed)
 The proposed treatment discussed in conference is for discussion purpose only and is not a binding recommendation.  The patients have not been physically examined, or presented with their treatment options.  Therefore, final treatment plans cannot be decided.

## 2023-09-23 NOTE — Telephone Encounter (Addendum)
 Called patient to relay message below as per Dr. Mosetta Putt, patient voiced full understanding and had no further questions at this time.   ----- Message from Malachy Mood sent at 09/22/2023  9:33 PM EST ----- Please let pt know that we have reviewed her PET scan in tumor conference today. Her adenopathy especially in her axillas are felt to be reactive and probably benign, and we do not recommend biopsy at this point (biopsy attempted last year but not visible on [place]). Thanks  Malachy Mood

## 2023-09-24 ENCOUNTER — Other Ambulatory Visit (HOSPITAL_COMMUNITY): Payer: Self-pay

## 2023-09-24 ENCOUNTER — Other Ambulatory Visit: Payer: Self-pay

## 2023-09-24 ENCOUNTER — Encounter: Payer: Self-pay | Admitting: Hematology

## 2023-09-27 ENCOUNTER — Other Ambulatory Visit (HOSPITAL_COMMUNITY): Payer: Self-pay

## 2023-09-27 ENCOUNTER — Other Ambulatory Visit: Payer: Self-pay

## 2023-09-28 ENCOUNTER — Encounter: Payer: Self-pay | Admitting: Physician Assistant

## 2023-09-28 ENCOUNTER — Other Ambulatory Visit: Payer: Self-pay

## 2023-09-28 ENCOUNTER — Other Ambulatory Visit (HOSPITAL_COMMUNITY): Payer: Self-pay

## 2023-09-28 ENCOUNTER — Ambulatory Visit: Payer: Medicaid Other | Admitting: Physician Assistant

## 2023-09-28 VITALS — BP 140/78 | HR 68 | Ht 72.0 in | Wt 270.0 lb

## 2023-09-28 DIAGNOSIS — Z85048 Personal history of other malignant neoplasm of rectum, rectosigmoid junction, and anus: Secondary | ICD-10-CM | POA: Diagnosis not present

## 2023-09-28 DIAGNOSIS — R12 Heartburn: Secondary | ICD-10-CM

## 2023-09-28 DIAGNOSIS — D509 Iron deficiency anemia, unspecified: Secondary | ICD-10-CM | POA: Diagnosis not present

## 2023-09-28 DIAGNOSIS — D649 Anemia, unspecified: Secondary | ICD-10-CM

## 2023-09-28 DIAGNOSIS — K219 Gastro-esophageal reflux disease without esophagitis: Secondary | ICD-10-CM

## 2023-09-28 MED ORDER — FAMOTIDINE 40 MG PO TABS
40.0000 mg | ORAL_TABLET | Freq: Two times a day (BID) | ORAL | 11 refills | Status: AC
  Filled 2023-09-28 – 2023-10-20 (×2): qty 60, 30d supply, fill #0
  Filled 2023-12-13: qty 60, 30d supply, fill #1
  Filled 2024-01-17: qty 60, 30d supply, fill #2
  Filled 2024-02-20: qty 60, 30d supply, fill #3
  Filled 2024-04-17: qty 60, 30d supply, fill #4
  Filled 2024-05-26: qty 60, 30d supply, fill #5
  Filled 2024-07-01: qty 60, 30d supply, fill #6
  Filled 2024-08-02: qty 60, 30d supply, fill #7
  Filled 2024-08-29: qty 60, 30d supply, fill #8

## 2023-09-28 NOTE — Progress Notes (Signed)
 Chief Complaint: Heartburn  HPI:    Tammy Garrison is a 33 year old African-American female, known to Dr. Leonides Schanz, with a past medical history as listed below including rheumatoid arthritis, rectal cancer, bipolar disorder and multiple others, who was referred to me by [facility] for a complaint of heartburn.      06/08/2023 patient seen in clinic by Tammy Mulling, PA-C for anemia.  Noted that she had a history of rectal cancer treated with chemotherapy, radiation and surgery and was having abdominal pain, nausea, vomiting and rectal pain.  She was scheduled for an EGD and colonoscopy.  Also discussed reflux, increase Pantoprazole to twice daily and recommend discontinuing use of Goody powders.    07/13/2023 colonoscopy with fair preparation of the colon, diverticulosis in the cecum, one 3 mm polyp in the ascending colon, erythematous mucosa in the sigmoid colon nonbleeding internal hemorrhoids.  Patient given Anusol HC cream twice daily for 7 days.    07/13/2023 EGD with gastritis, esophagus dilated, continued on a PPI twice daily and added on Famotidine 20 twice daily.    09/14/2023 CMP normal and CBC with normal hemoglobin.    Today, patient presents to clinic and tells me that she is doing fairly well although she still battles with daily heartburn describing a burning in her chest.  She has tried over-the-counter Pepto and Tums for this but does not help.  Currently still on Pantoprazole 40 twice daily and Famotidine 20 twice daily.  She does tell me that she typically eats very late at night, typically around 10:00pm before laying down as that is what her schedule allows.  She is taking her Pantoprazole and Famotidine together right before she lays down to sleep and the other doses before breakfast.  No further abdominal pain or other symptoms.    Denies fever, chills or weight loss.  Past Medical History:  Diagnosis Date   Anemia    Bipolar 1 disorder (HCC)    Cancer (HCC)    squamous  cell rectal cancer   Diabetes (HCC)    PREDIABETIC   ETOH abuse    H/O self-harm    H/O suicide attempt    x 5 - last 05/2016 - overdose Ibuprofen   Marijuana abuse    Rheumatoid arthritis (HCC)     Past Surgical History:  Procedure Laterality Date   HEMORRHOID SURGERY N/A 02/08/2022   Procedure: EXTERNAL AND INTERNAL HEMORRHOIDECTOMY;  Surgeon: Violeta Gelinas, MD;  Location: [facility] OR;  Service: General;  Laterality: N/A;   INCISION AND DRAINAGE ABSCESS Right 04/22/2022   Procedure: INCISION AND DRAINAGE axilla;  Surgeon: Sheliah Hatch, De Blanch, MD;  Location: [facility] ORS;  Service: General;  Laterality: Right;   RECTAL BIOPSY N/A 05/10/2023   Procedure: EXCISION OF LEFT PERIANAL NODULE;  Surgeon: Andria Meuse, MD;  Location: [facility] OR;  Service: General;  Laterality: N/A;   RECTAL EXAM UNDER ANESTHESIA N/A 07/21/2022   Procedure: RECTAL EXAM UNDER ANESTHESIA;  Surgeon: Andria Meuse, MD;  Location: [facility] OR;  Service: General;  Laterality: N/A;   RECTAL EXAM UNDER ANESTHESIA N/A 05/10/2023   Procedure: RECTAL EXAM UNDER ANESTHESIA;  Surgeon: Andria Meuse, MD;  Location: [facility] OR;  Service: General;  Laterality: N/A;  60   TRANSANAL HEMORRHOIDAL DEARTERIALIZATION N/A 07/21/2022   Procedure: ANAL CANAL BIOPSY;  Surgeon: Andria Meuse, MD;  Location: [facility] OR;  Service: General;  Laterality: N/A;    Current Outpatient Medications  Medication Sig Dispense Refill   acetaminophen (TYLENOL) 325  MG tablet Take 2 tablets (650 mg total) by mouth every 6 (six) hours as needed for mild pain. (Patient taking differently: Take 500-1,500 mg by mouth every 6 (six) hours as needed for mild pain (pain score 1-3).)     amLODipine (NORVASC) 5 MG tablet Take 1 tablet (5 mg total) by mouth daily. 30 tablet 2   amLODipine (NORVASC) 5 MG tablet PLEASE DISREGARD 1 tablet 0   diclofenac (VOLTAREN) 75 MG EC tablet Take 1 tablet (75 mg total) by mouth 2 (two) times daily as needed with food 60 tablet 2    dicyclomine (BENTYL) 20 MG tablet Take 1 tablet (20 mg total) by mouth 3 (three) times daily as needed for spasms. 50 tablet 0   doxycycline (VIBRAMYCIN) 100 MG capsule Take 1 capsule (100 mg total) by mouth 2 (two) times daily. 19 capsule 0   famotidine (PEPCID) 20 MG tablet Take 1 tablet (20 mg total) by mouth 2 (two) times daily. 60 tablet 3   gabapentin (NEURONTIN) 300 MG capsule Take 1 capsule (300 mg total) by mouth 2 (two) times daily. 60 capsule 5   leflunomide (ARAVA) 20 MG tablet Take 1 tablet (20 mg total) by mouth at bedtime. 90 tablet 0   losartan (COZAAR) 50 MG tablet Take 1 tablet (50 mg total) by mouth daily. 30 tablet 2   predniSONE (DELTASONE) 20 MG tablet Take 3 tablets (60 mg total) by mouth daily with breakfast for 4 days, THEN 2 tablets (40 mg total) daily with breakfast for 4 days, THEN 1 tablet (20 mg total) daily with breakfast for 4 days. 24 tablet 0   tiZANidine (ZANAFLEX) 4 MG tablet Take 1 tablet (4 mg total) by mouth 2 (two) times daily as needed. 60 tablet 2   Tocilizumab (ACTEMRA ACTPEN) 162 MG/0.9ML SOAJ Inject 162 mg into the skin every 14 (fourteen) days. 1.8 mL 2   Vitamin D, Ergocalciferol, 50000 units CAPS Take 1 capsule by mouth every 7 (seven) days. 4 capsule 5   DULoxetine (CYMBALTA) 60 MG capsule Take 1 capsule (60 mg total) by mouth at bedtime. (Patient not taking: Reported on 09/28/2023) 90 capsule 0   hydrocortisone (ANUSOL-HC) 2.5 % rectal cream Place 1 application rectally 2 (two) times daily for 7 days. (Patient not taking: Reported on 09/28/2023) 30 g 1   pantoprazole (PROTONIX) 40 MG tablet Take 1 tablet (40 mg total) by mouth 2 (two) times daily before a meal. (Patient not taking: Reported on 09/28/2023) 60 tablet 1   No current facility-administered medications for this visit.   Facility-Administered Medications Ordered in Other Visits  Medication Dose Route Frequency Provider Last Rate Last Admin   0.9 %  sodium chloride infusion (Manually program  via Guardrails IV Fluids)  250 mL Intravenous Once Malachy Mood, MD       heparin lock flush 100 unit/mL  250 Units Intracatheter Once Malachy Mood, MD       sodium chloride flush (NS) 0.9 % injection 10 mL  10 mL Intracatheter Once Malachy Mood, MD       sodium chloride flush (NS) 0.9 % injection 10 mL  10 mL Intracatheter Once Malachy Mood, MD       sodium chloride flush (NS) 0.9 % injection 10 mL  10 mL Intracatheter Once Malachy Mood, MD        Allergies as of 09/28/2023 - Review Complete 09/28/2023  Allergen Reaction Noted   Coconut flavoring agent (non-screening) Itching and Rash 10/17/2016    Family  History  Problem Relation Age of Onset   Lupus Mother    Diabetes Father    Hypertension Other    Colon cancer Neg Hx    Colon polyps Neg Hx    Stomach cancer Neg Hx     Social History   Socioeconomic History   Marital status: Single    Spouse name: Not on file   Number of children: 0   Years of education: Not on file   Highest education level: Not on file  Occupational History   Occupation: call center   Tobacco Use   Smoking status: Former    Current packs/day: 0.00    Average packs/day: 0.3 packs/day for 7.0 years (1.8 ttl pk-yrs)    Types: Cigarettes    Start date: 02/17/2012    Quit date: 02/17/2019    Years since quitting: 4.6    Passive exposure: Current   Smokeless tobacco: Never  Vaping Use   Vaping status: Former   Substances: Nicotine  Substance and Sexual Activity   Alcohol use: Not Currently    Comment: social   Drug use: Not Currently    Frequency: 7.0 times per week    Types: Marijuana   Sexual activity: Yes    Birth control/protection: None  Other Topics Concern   Not on file  Social History Narrative   Not on file   Social Drivers of Health   Financial Resource Strain: High Risk (02/26/2022)   Overall Financial Resource Strain (CARDIA)    Difficulty of Paying Living Expenses: Very hard  Food Insecurity: Food Insecurity Present (11/29/2022)   Hunger  Vital Sign    Worried About Running Out of Food in the Last Year: Often true    Ran Out of Food in the Last Year: Often true  Transportation Needs: No Transportation Needs (11/29/2022)   PRAPARE - Administrator, Civil Service (Medical): No    Lack of Transportation (Non-Medical): No  Physical Activity: Not on file  Stress: Not on file  Social Connections: Not on file  Intimate Partner Violence: Not At Risk (11/29/2022)   Humiliation, Afraid, Rape, and Kick questionnaire    Fear of Current or Ex-Partner: No    Emotionally Abused: No    Physically Abused: No    Sexually Abused: No    Review of Systems:    Constitutional: No weight loss, fever or chills Cardiovascular: No chest pain Respiratory: No SOB Gastrointestinal: See HPI and otherwise negative   Physical Exam:  Vital signs: BP (!) 140/78 (BP Location: Left Arm, Patient Position: Sitting)   Pulse 68   Ht 6' (1.829 m)   Wt 270 lb (122.5 kg)   SpO2 100%   BMI 36.62 kg/m   Constitutional:   Pleasant overweight AA female appears to be in NAD, Well developed, Well nourished, alert and cooperative  Respiratory: Respirations even and unlabored. Lungs clear to auscultation bilaterally.   No wheezes, crackles, or rhonchi.  Cardiovascular: Normal S1, S2. No MRG. Regular rate and rhythm. No peripheral edema, cyanosis or pallor.  Gastrointestinal:  Soft, nondistended, nontender. No rebound or guarding. Normal bowel sounds. No appreciable masses or hepatomegaly. Rectal:  Not performed.  Psychiatric: Oriented to person, place and time. Demonstrates good judgement and reason without abnormal affect or behaviors.  RELEVANT LABS AND IMAGING: CBC    Component Value Date/Time   WBC 3.6 (L) 09/14/2023 1147   RBC 4.58 09/14/2023 1147   HGB 12.3 09/14/2023 1147   HGB 10.8 (L) 03/30/2022 5784  HCT 39.1 09/14/2023 1147   HCT 30.0 (L) 03/20/2019 1613   PLT 344 09/14/2023 1147   PLT 155 03/30/2022 0943   MCV 85.4 09/14/2023  1147   MCH 26.9 (L) 09/14/2023 1147   MCHC 31.5 (L) 09/14/2023 1147   RDW 15.9 (H) 09/14/2023 1147   LYMPHSABS 1.1 08/15/2023 1444   MONOABS 0.4 08/15/2023 1444   EOSABS 184 09/14/2023 1147   BASOSABS 22 09/14/2023 1147    CMP     Component Value Date/Time   NA 139 09/14/2023 1147   K 3.9 09/14/2023 1147   CL 103 09/14/2023 1147   CO2 26 09/14/2023 1147   GLUCOSE 97 09/14/2023 1147   BUN 12 09/14/2023 1147   CREATININE 0.64 09/14/2023 1147   CALCIUM 9.7 09/14/2023 1147   PROT 7.6 09/14/2023 1147   ALBUMIN 4.0 08/15/2023 1444   AST 17 09/14/2023 1147   AST 26 03/30/2022 0943   ALT 25 09/14/2023 1147   ALT 42 03/30/2022 0943   ALKPHOS 106 08/15/2023 1444   BILITOT 0.6 09/14/2023 1147   BILITOT 0.6 03/30/2022 0943   GFRNONAA >60 08/15/2023 1444   GFRNONAA >60 03/30/2022 0943   GFRAA >60 04/12/2018 1520    Assessment: 1.  Heartburn: Continues with issues regardless of Pantoprazole 40 twice daily and Famotidine 20 twice daily; likely diet/lifestyle is contributing 2.  IDA: Recent EGD and colonoscopy with erosions in the stomach thought partially related to Goody powders and may be contributing to her iron deficiency 3.  History of rectal cancer: Status posttreatment  Plan: 1.  Increase Famotidine to 40 mg twice a day, every morning and nightly. #60 with 11 refills 2.  Continue Pantoprazole 40 mg twice daily, will try to take this 30-60 minutes before breakfast and then again before dinner. 3.  Reviewed antireflux diet and lifestyle modifications.  It is not good for her to eat a large meal right before laying down to go to sleep as this will worsen things. 4.  Tells me she has discontinued Goody powders and NSAIDs.  Encouraged her to continue doing this 5.  Patient to follow in clinic in 2 to 3 months.  She will let me know how she is doing sooner.  If still with breakthrough symptoms could consider adding Carafate.  Hyacinth Meeker, PA-C [facility] 09/28/2023, 9:04 AM  Cc: [facility]

## 2023-09-28 NOTE — Patient Instructions (Signed)
 We have sent the following medications to your pharmacy for you to pick up at your convenience: Famotidine- increased to 40mg  twice daily.   _______________________________________________________  If your blood pressure at your visit was 140/90 or greater, please contact your primary care physician to follow up on this.  _______________________________________________________  If you are age 31 or older, your body mass index should be between 23-30. Your Body mass index is 36.62 kg/m. If this is out of the aforementioned range listed, please consider follow up with your Primary Care Provider.  If you are age 31 or younger, your body mass index should be between 19-25. Your Body mass index is 36.62 kg/m. If this is out of the aformentioned range listed, please consider follow up with your Primary Care Provider.   ________________________________________________________  The [facility] providers would like to encourage you to use [facility] to communicate with providers for non-urgent requests or questions.  Due to long hold times on the telephone, sending your provider a message by [facility] may be a faster and more efficient way to get a response.  Please allow 48 business hours for a response.  Please remember that this is for non-urgent requests.  _______________________________________________________  It was a pleasure to see you today!  Thank you for trusting me with your gastrointestinal care!

## 2023-09-28 NOTE — Progress Notes (Signed)
 I agree with the assessment and plan as outlined by [name].

## 2023-10-11 ENCOUNTER — Other Ambulatory Visit (HOSPITAL_COMMUNITY): Payer: Self-pay

## 2023-10-12 ENCOUNTER — Other Ambulatory Visit (HOSPITAL_COMMUNITY): Payer: Self-pay

## 2023-10-12 ENCOUNTER — Other Ambulatory Visit: Payer: Self-pay | Admitting: Internal Medicine

## 2023-10-12 ENCOUNTER — Other Ambulatory Visit: Payer: Self-pay

## 2023-10-12 DIAGNOSIS — L732 Hidradenitis suppurativa: Secondary | ICD-10-CM

## 2023-10-12 DIAGNOSIS — M069 Rheumatoid arthritis, unspecified: Secondary | ICD-10-CM

## 2023-10-12 DIAGNOSIS — Z79899 Other long term (current) drug therapy: Secondary | ICD-10-CM

## 2023-10-12 MED ORDER — ACTEMRA ACTPEN 162 MG/0.9ML ~~LOC~~ SOAJ
162.0000 mg | SUBCUTANEOUS | 2 refills | Status: DC
  Filled 2023-10-13 – 2023-10-18 (×2): qty 1.8, 28d supply, fill #0

## 2023-10-12 NOTE — Telephone Encounter (Signed)
 Last Fill: 07/21/2023  Labs: 09/14/2023 Sedimentation rate improved a lot down to 36 from 118.  Like we discussed sometimes with Actemra the numbers improved a lot faster than patient see a difference in symptoms and I would recommend continuing the medicine for now. If she wants to in the short-term, we could repeat an oral steroid taper since I am hoping on more relief with longer on Actemra. White blood cells decreased mildly down to 3.6 this is probably an effect of the medication.  Kidney and liver enzymes were normal.  Cholesterol profile looks worse compared to 6 months ago.  Total cholesterol of 196 is not too high but the LDL cholesterol went up to 130.  This particular side effect is also on that can occur with the Rinvoq.  If these remain more elevated consistently we will need to consider cholesterol medication.  TB Gold: 12/25/2022 Negative   Next Visit: 11/04/2023  Last Visit: 09/14/2023  [id number] arthritis involving multiple sites, unspecified whether rheumatoid factor present (HCC)   Current Dose per office note 09/14/2023: Actemra 162 mg [place] q14days   Okay to refill Actemra?

## 2023-10-13 ENCOUNTER — Other Ambulatory Visit: Payer: Self-pay

## 2023-10-13 ENCOUNTER — Other Ambulatory Visit (HOSPITAL_COMMUNITY): Payer: Self-pay

## 2023-10-13 MED ORDER — LOSARTAN POTASSIUM 50 MG PO TABS
50.0000 mg | ORAL_TABLET | Freq: Every day | ORAL | 5 refills | Status: DC
  Filled 2023-10-13: qty 30, 30d supply, fill #0
  Filled 2024-01-02: qty 30, 30d supply, fill #1

## 2023-10-13 MED ORDER — LOSARTAN POTASSIUM 50 MG PO TABS
50.0000 mg | ORAL_TABLET | Freq: Every day | ORAL | 5 refills | Status: DC
  Filled 2023-11-15: qty 30, 30d supply, fill #0

## 2023-10-13 MED ORDER — TRAZODONE HCL 100 MG PO TABS
100.0000 mg | ORAL_TABLET | Freq: Every evening | ORAL | 2 refills | Status: DC | PRN
  Filled 2023-10-13: qty 30, 30d supply, fill #0
  Filled 2023-11-15: qty 30, 30d supply, fill #1
  Filled 2023-12-13: qty 30, 30d supply, fill #2

## 2023-10-14 ENCOUNTER — Other Ambulatory Visit (HOSPITAL_COMMUNITY): Payer: Self-pay

## 2023-10-18 ENCOUNTER — Other Ambulatory Visit: Payer: Self-pay

## 2023-10-18 NOTE — Progress Notes (Signed)
 Specialty Pharmacy Refill Coordination Note  Tammy Garrison is a 33 y.o. female contacted today regarding refills of specialty medication(s) Tocilizumab (Actemra ACTPen)   Patient requested (Patient-Rptd) Delivery   Delivery date: (Patient-Rptd) 10/19/23   Verified address: (Patient-Rptd) [address] [place] [address]   Medication will be filled on 03.17.25.

## 2023-10-20 ENCOUNTER — Other Ambulatory Visit (HOSPITAL_COMMUNITY): Payer: Self-pay

## 2023-10-20 ENCOUNTER — Other Ambulatory Visit: Payer: Self-pay

## 2023-10-21 ENCOUNTER — Other Ambulatory Visit (HOSPITAL_COMMUNITY): Payer: Self-pay

## 2023-10-21 MED ORDER — VALACYCLOVIR HCL 500 MG PO TABS
500.0000 mg | ORAL_TABLET | Freq: Every day | ORAL | 5 refills | Status: AC
Start: 2023-10-21 — End: ?
  Filled 2023-10-21: qty 34, 34d supply, fill #0
  Filled 2023-11-29: qty 34, 34d supply, fill #1
  Filled 2024-01-02: qty 34, 34d supply, fill #2
  Filled 2024-01-24 – 2024-01-31 (×2): qty 34, 34d supply, fill #3
  Filled 2024-02-20 – 2024-03-13 (×2): qty 34, 34d supply, fill #4
  Filled 2024-04-17: qty 34, 34d supply, fill #5
  Filled 2024-05-26: qty 34, 34d supply, fill #6
  Filled 2024-07-01: qty 34, 34d supply, fill #7
  Filled 2024-08-02: qty 34, 34d supply, fill #8

## 2023-10-22 NOTE — Progress Notes (Signed)
 Office Visit Note  Patient: Tammy Garrison             Date of Birth: [birth year]           MRN: [id number]             PCP: [facility] Referring: [facility] Visit Date: 11/04/2023   Subjective:  Follow-up  Discussed the use of AI scribe software for clinical note transcription with the patient, who gave verbal consent to proceed.  History of Present Illness   Tammy Garrison is a 33 y.o. 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   She experiences persistent joint pain and stiffness, primarily affecting her hands and feet. Actemra has not provided significant relief, and she continues to experience weakness and pain, particularly in her left hand. She describes difficulty with daily activities such as putting on socks, applying lotion, and sometimes showering due to the pain and weakness.  She has previously been treated with oral steroids for joint pain, which provided temporary relief. However, the pain returns within a day or two after stopping the steroids. She keeps a cane in her cart for days when her foot does not cooperate.  She reports frequent infections, including a recent sinus infection and COVID-19. After taking antibiotics, she often becomes sick again within a few days. She recently underwent testing for flu and COVID-19, which returned negative results earlier this week.  No significant swelling in her joints, but persistent pain and weakness are present. No numbness or loss of feeling in her hands.       Previous HPI 09/14/2023 Tammy Garrison is a 33 y.o. female here for follow up for seropositive RA on Leflunomide 20 mg daily and actemra 162 mg [place] q14days started since December.     She experiences persistent joint pain and swelling despite treatment with Actemra injections. She continues to take leflunomide. There has been no significant  improvement in her inflammation. Her pain is particularly severe in her foot, hand, and knee. Her knee is described as 'really swollen' with significant pain at night, especially if kept straight for long periods, making it difficult to bend. Her left wrist is also swollen, and she cannot wear socks for long periods due to swelling in her foot, which makes walking difficult.   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 Pain medications are ineffective, particularly at night, leading to sleeplessness. No issues have been noted at the injection sites.   She often gets sick after taking Actemra, including catching a virus shortly after her last dose, which left her unwell for a week. She has not required steroids for recent infections but has been treated with antibiotics. No rashes or issues at the injection sites. Swelling is noted in multiple joints, including her left wrist and knees, with the left knee being less affected than the right.     Previous HPI 07/12/2023 Tammy Garrison is a 33 y.o. 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 She reports persistent pain, particularly in the hands, which has limited their ability to perform tasks requiring manual dexterity. They also note improvement in their leg strength, as they are now able to ambulate without a cane. Despite this, they report persistent swelling in their left foot, which occasionally prevents them from  wearing socks.   To manage the ongoing pain, their primary care physician prescribed tizanidine, which has helped with mobility but has not improved their hand function. The patient also reports numbness in their hands and pain in their wrists. They have been experiencing pain in their shoulders when they sleep on their sides and have therefore been trying to sleep on their back.   The patient also reports swelling and pain in their  left foot, particularly when moving it side to side and up and down. They deny any pain in their toes.   In addition to these symptoms, the patient has been experiencing issues with their colon and is scheduled for a colonoscopy to investigate potential ulcers. They have been taking Tylenol and nsaids, most recently oral diclofenac for pain management, which they believe may have contributed to the potential ulcers. They deny any history of diverticulitis or blood clots.     Previous HPI 05/12/2023 Tammy Garrison is a 33 y.o. 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  After last visit on the prednisone taper she felt a good improvement in symptoms while taking 60 mg and then 40 mg of prednisone but started to get more pain and swelling back at 20 mg and less.  So far not sure if the Cymbalta and leflunomide is making a big impact on her symptoms.  Still has pretty severe pain with any pressure and very limited in her activities.  Her friend is needing to help out up to an hour every day with basic daily activities.  She has not noticed a difference with starting the low-dose Cymbalta but no particular side effect.  She went for exam for nodule near the anus with biopsy pathology report was consistent for thrombosed hemorrhoid.   Previous HPI 04/07/2023 Tammy Garrison is a 33 y.o. 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  She developed an outbreak of painful vesicular rashes on gentle seen at the emergency department diagnosis for genital herpes outbreak with concern about this triggered by her Cloria Danger and steroid treatment.  She took oral antibiotics course and valacyclovir with resolution of this outbreak.  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  When on  the prednisone taper at 60 and 40 mg had significant improvement. Since decreasing to 20 mg and stopping she has swelling and severe pain and stiffness again.  This interferes with her basic activities of daily living such as getting dressed and she is using a cane for support while walking.  Also currently feels some exacerbation of her anxiety and depressive symptoms off her mirtazapine for the past month has not been to get back in with a primary care office yet for maintenance medication refills.   Previous HPI 02/24/2023 Tammy Garrison is a 33 y.o. 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  She saw partial benefit when on the 30 mg prednisone but has really not been doing well at any point since her last visit.  Still with joint pain and swelling in multiple areas.  Has trouble standing up due to leg pains and cannot use her arms to push off easily due to her pain especially left elbow.  Over the interval she had  follow-up scans with oncology including PET scan on July 5 this was concerning for hypermetabolic regional lymph nodes around the associated anal squamous cell cancer.  She was recommended for new scope and biopsy has not yet been established went over with him so far.  Currently symptoms completely prevent her from working and are limiting even for her ADLs requiring assistance to get back and forth to the bathroom and move around the house.   Previous HPI 12/25/22 Tammy Garrison is a 33 y.o. female here for evaluation and management of seropositive rheumatoid arthritis.  Originally diagnosed with onset of symptoms and 2019 and saw Dr. Henrine Logan for this.  She was initially treated with methotrexate which was ineffective subsequently treated with Cloria Danger with a good improvement.  She discontinued treatment in 2021 due to cost being prohibitive also was in disease remission at the time.  She continued to do well off of any  maintenance therapy until last year after diagnosis with anal carcinoma then treated with concurrent chemoradiation therapy and redeveloped significant joint inflammation in multiple areas.  Her treatments and symptoms that put her out of work most of the past year.  The past few months joint inflammation is getting worse has been seen at the hospital multiple times and treated with prolonged steroid tapers twice in February and April.  She see symptom relief when started on the high-dose steroids typically with some starting to return near the end of the taper and then gets a severe flare again within a few days of discontinuing medicine.  Currently she is off any steroids for 4 days and has severe joint pain and swelling in multiple areas. She also has hidradenitis suppurativa with involvement of the axillary, intertriginous, and groin areas.  No current open cysts or drainage.  Does notice some hardened mildly tender area along the left lower breast.  She was never on prolonged antibiotics and does not see dermatology for this condition.  HS symptoms were also quiescent during the few years off treatment before her cancer last year. No history of frequent respiratory or urinary tract infections.  She has had problems with bleeding and blood clots associated with the anal cancer but not prior to this.  No shortness of breath or persistent cough.   DMARD Hx Cloria Danger - stopped 2021 due to cost and disease remission MTX - nonresponder   Review of Systems  Constitutional:  Positive for fatigue.  HENT:  Negative for mouth sores and mouth dryness.   Eyes:  Negative for dryness.  Respiratory:  Positive for shortness of breath.   Cardiovascular:  Positive for chest pain and palpitations.  Gastrointestinal:  Positive for diarrhea. Negative for blood in stool and constipation.  Endocrine: Positive for increased urination.  Genitourinary:  Positive for involuntary urination.  Musculoskeletal:  Positive for  joint pain, gait problem, joint pain, joint swelling, myalgias, muscle weakness, morning stiffness, muscle tenderness and myalgias.  Skin:  Positive for sensitivity to sunlight. Negative for color change, rash and hair loss.  Allergic/Immunologic: Positive for susceptible to infections.  Neurological:  Positive for dizziness and headaches.  Hematological:  Positive for swollen glands.  Psychiatric/Behavioral:  Positive for depressed mood and sleep disturbance. The patient is nervous/anxious.     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   Bipolar 1 disorder (HCC) 02/23/2022   RA (rheumatoid arthritis) (HCC) 02/07/2022   Syncope 02/06/2022   ABLA (acute blood loss anemia) 02/06/2022   BRBPR (bright red blood per rectum) 02/06/2022   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   Depression 05/20/2016   Marijuana dependence (HCC) 05/19/2016    Past Medical History:  Diagnosis Date   Anemia    Bipolar 1 disorder (HCC)    Cancer (HCC)    squamous cell rectal cancer   Diabetes (HCC)    PREDIABETIC   ETOH abuse    H/O self-harm    H/O suicide attempt    x 5 - last 05/2016 - overdose Ibuprofen   Marijuana abuse    Rheumatoid arthritis (HCC)     Family History  Problem Relation Age of Onset   Lupus Mother    Diabetes Father    Hypertension Other    Colon cancer Neg Hx    Colon polyps Neg Hx     Stomach cancer Neg Hx    Past Surgical History:  Procedure Laterality Date   HEMORRHOID SURGERY N/A 02/08/2022   Procedure: EXTERNAL AND INTERNAL HEMORRHOIDECTOMY;  Surgeon: Dorena Gander, MD;  Location: [facility] OR;  Service: General;  Laterality: N/A;   INCISION AND DRAINAGE ABSCESS Right 04/22/2022   Procedure: INCISION AND DRAINAGE axilla;  Surgeon: Dorrie Gaudier, Alphonso Aschoff, MD;  Location: [facility] ORS;  Service: General;  Laterality: Right;   RECTAL BIOPSY N/A 05/10/2023   Procedure: EXCISION OF LEFT PERIANAL NODULE;  Surgeon: Melvenia Stabs, MD;  Location: [facility] OR;  Service: General;  Laterality: N/A;   RECTAL EXAM UNDER ANESTHESIA N/A 07/21/2022   Procedure: RECTAL EXAM UNDER ANESTHESIA;  Surgeon: Melvenia Stabs, MD;  Location: [facility] OR;  Service: General;  Laterality: N/A;   RECTAL EXAM UNDER ANESTHESIA N/A 05/10/2023   Procedure: RECTAL EXAM UNDER ANESTHESIA;  Surgeon: Melvenia Stabs, MD;  Location: [facility] OR;  Service: General;  Laterality: N/A;  60   TRANSANAL HEMORRHOIDAL DEARTERIALIZATION N/A 07/21/2022   Procedure: ANAL CANAL BIOPSY;  Surgeon: Melvenia Stabs, MD;  Location: [facility] OR;  Service: General;  Laterality: N/A;   Social History   Social History Narrative   Not on file    There is no immunization history on file for this patient.   Objective: Vital Signs: BP 128/89 (BP Location: Left Arm, Patient Position: Sitting, Cuff Size: Normal)   Pulse 86   Resp 16   Ht 6' (1.829 m)   Wt 272 lb (123.4 kg)   BMI 36.89 kg/m    Physical Exam Eyes:     Conjunctiva/sclera: Conjunctivae normal.  Cardiovascular:     Rate and Rhythm: Normal rate and regular rhythm.  Pulmonary:     Effort: Pulmonary effort is normal.     Breath sounds: Normal breath sounds.  Skin:    General: Skin is warm and dry.     Findings: No rash.  Neurological:     Mental Status: She is alert.  Psychiatric:        Mood and Affect: Mood normal.      Musculoskeletal Exam:  Left shoulder pain  on movement Left elbow tenderness with no swelling B/l wrist swelling present, decreased right wrist ROM tolerated, tenderness to pressure without radiation Fingers full ROM, tenderness across MCP joints without palpable synovitis, Left hand pain worse than right Knees full ROM no tenderness or swelling Left ankle swelling and warmth, tenderness to pressure and with movement which is restricted Left 1st MTP swelling and tenderness    Investigation: No additional findings.  Imaging: No results found.  Recent Labs: Lab Results  Component Value Date   WBC 4.1 11/04/2023   HGB 13.1 11/04/2023   PLT 205 11/04/2023   NA 139 11/04/2023   K 4.1 11/04/2023   CL 107 11/04/2023   CO2 25 11/04/2023   GLUCOSE 107 (H) 11/04/2023   BUN 14 11/04/2023   CREATININE 0.63 11/04/2023   BILITOT 0.4 11/04/2023   ALKPHOS 106 08/15/2023   AST 19 11/04/2023   ALT 33 (H) 11/04/2023   PROT 7.2 11/04/2023   ALBUMIN 4.0 08/15/2023   CALCIUM 9.5 11/04/2023   GFRAA >60 04/12/2018   QFTBGOLDPLUS NEGATIVE 12/25/2022    Speciality Comments: No specialty comments available.  Procedures:  No procedures performed Allergies: Coconut flavoring agent (non-screening)   Assessment / Plan:     Visit Diagnoses: Rheumatoid arthritis involving multiple sites, unspecified whether rheumatoid factor present (HCC) - Plan: predniSONE (DELTASONE) 5 MG tablet, Sedimentation rate Joint pain and weakness persist despite Actemra. Inflammation markers improved but insufficient clinical relief. Actemra inadequate, consider Rinvoq for selective action and faster onset with lower viral infection risk. - Discontinue Actemra. - Initiate Rinvoq 15 mg PO daily - Prescribe low-dose prednisone 10 mg/5 mg taper during transition.  High risk medication use - Consider Aciclovir if Rinvoq initiated due to history of HSV. Actemra 162 mg [place] q14days, leflunomide 20 mg daily for now - Plan: CBC with Differential/Platelet, Comprehensive  metabolic panel with GFR Rechecking baseline lab tests for medication monitoring anticipate change of treatment due to low response. Previously not high concern for bDMARD treatment per oncologist with close surveillance without disease activity. Patient counseled on medicaiton as detailed with clinica pharmacist and also previously tolerant of xeljanz.  Anal squamous cell carcinoma (HCC) - Ongoing follow up wit Dr. Maryalice Smaller  Chronic pain syndrome - Cymbalta 60 mg daily - Plan: DULoxetine (CYMBALTA) 60 MG capsule, tiZANidine (ZANAFLEX) 4 MG tablet  Recurrent infections Frequent infections, including sinus infection and COVID-19, possibly linked to immunosuppressive therapy. Recent tests negative. - Monitor for infection signs. - Review medications for infection risk impact.        Orders: Orders Placed This Encounter  Procedures   Sedimentation rate   CBC with Differential/Platelet   Comprehensive metabolic panel with GFR   Meds ordered this encounter  Medications   DULoxetine (CYMBALTA) 60 MG capsule    Sig: Take 1 capsule (60 mg total) by mouth at bedtime.    Dispense:  90 capsule    Refill:  0   tiZANidine (ZANAFLEX) 4 MG tablet    Sig: Take 1 tablet (4 mg total) by mouth 2 (two) times daily as needed.    Dispense:  60 tablet    Refill:  2   predniSONE (DELTASONE) 5 MG tablet    Sig: Take 2 tablets (10 mg total) by mouth daily with breakfast for 30 days, THEN 1 tablet (5 mg total) daily with breakfast.    Dispense:  60 tablet    Refill:  1     Follow-Up Instructions: Return in about 2 months (around 01/04/2024)  for RA GC/UPA start.   Matt Song, MD  Note - This record has been created using [employer].  Chart creation errors have been sought, but may not always  have been located. Such creation errors do not reflect on  the standard of medical care.

## 2023-10-25 ENCOUNTER — Other Ambulatory Visit (HOSPITAL_COMMUNITY): Payer: Self-pay

## 2023-10-25 MED ORDER — AMOXICILLIN-POT CLAVULANATE 875-125 MG PO TABS
1.0000 | ORAL_TABLET | Freq: Two times a day (BID) | ORAL | 0 refills | Status: DC
  Filled 2023-10-25: qty 20, 10d supply, fill #0

## 2023-10-25 MED ORDER — METHYLPREDNISOLONE 4 MG PO TBPK
ORAL_TABLET | ORAL | 0 refills | Status: DC
  Filled 2023-10-25: qty 21, 6d supply, fill #0

## 2023-10-26 ENCOUNTER — Other Ambulatory Visit (HOSPITAL_COMMUNITY): Payer: Self-pay

## 2023-10-26 MED ORDER — ROSUVASTATIN CALCIUM 10 MG PO TABS
10.0000 mg | ORAL_TABLET | Freq: Every day | ORAL | 0 refills | Status: DC
Start: 2023-10-26 — End: 2024-02-01
  Filled 2023-10-26 – 2023-11-03 (×2): qty 90, 90d supply, fill #0

## 2023-11-02 ENCOUNTER — Other Ambulatory Visit: Payer: Self-pay | Admitting: Physician Assistant

## 2023-11-02 DIAGNOSIS — R194 Change in bowel habit: Secondary | ICD-10-CM

## 2023-11-03 ENCOUNTER — Other Ambulatory Visit (HOSPITAL_COMMUNITY): Payer: Self-pay

## 2023-11-03 ENCOUNTER — Other Ambulatory Visit: Payer: Self-pay

## 2023-11-03 MED ORDER — DICYCLOMINE HCL 20 MG PO TABS
20.0000 mg | ORAL_TABLET | Freq: Three times a day (TID) | ORAL | 0 refills | Status: DC | PRN
  Filled 2023-11-03: qty 50, 17d supply, fill #0

## 2023-11-04 ENCOUNTER — Ambulatory Visit: Payer: Medicaid Other | Attending: Internal Medicine | Admitting: Internal Medicine

## 2023-11-04 ENCOUNTER — Encounter: Payer: Self-pay | Admitting: Internal Medicine

## 2023-11-04 ENCOUNTER — Other Ambulatory Visit: Payer: Self-pay

## 2023-11-04 ENCOUNTER — Other Ambulatory Visit (HOSPITAL_COMMUNITY): Payer: Self-pay

## 2023-11-04 VITALS — BP 128/89 | HR 86 | Resp 16 | Ht 72.0 in | Wt 272.0 lb

## 2023-11-04 DIAGNOSIS — Z79899 Other long term (current) drug therapy: Secondary | ICD-10-CM | POA: Diagnosis not present

## 2023-11-04 DIAGNOSIS — C210 Malignant neoplasm of anus, unspecified: Secondary | ICD-10-CM

## 2023-11-04 DIAGNOSIS — M069 Rheumatoid arthritis, unspecified: Secondary | ICD-10-CM | POA: Diagnosis not present

## 2023-11-04 DIAGNOSIS — G894 Chronic pain syndrome: Secondary | ICD-10-CM

## 2023-11-04 MED ORDER — PREDNISONE 5 MG PO TABS
ORAL_TABLET | ORAL | 1 refills | Status: AC
  Filled 2023-11-04: qty 60, 30d supply, fill #0
  Filled 2023-11-29: qty 60, 30d supply, fill #1

## 2023-11-04 MED ORDER — TIZANIDINE HCL 4 MG PO TABS
4.0000 mg | ORAL_TABLET | Freq: Two times a day (BID) | ORAL | 2 refills | Status: DC | PRN
  Filled 2023-11-04: qty 60, 30d supply, fill #0
  Filled 2024-01-02: qty 60, 30d supply, fill #1
  Filled 2024-03-20: qty 60, 30d supply, fill #2

## 2023-11-04 MED ORDER — DULOXETINE HCL 60 MG PO CPEP
60.0000 mg | ORAL_CAPSULE | Freq: Every day | ORAL | 0 refills | Status: DC
  Filled 2023-11-04: qty 90, 90d supply, fill #0

## 2023-11-04 NOTE — Patient Instructions (Signed)
 Upadacitinib Extended-Release Tablets What is this medication? UPADACITINIB (ue PAD a SYE ti nib) treats autoimmune conditions, such as arthritis, eczema, and ulcerative colitis. It is often used when other medications have not worked well enough or cannot be tolerated. It works by slowing down an overactive immune system. This decreases inflammation. This medicine may be used for other purposes; ask your health care provider or pharmacist if you have questions. COMMON BRAND NAME(S): RINVOQ What should I tell my care team before I take this medication? They need to know if you have any of these conditions: Cancer Current or past tobacco use Diabetes Have had a heart attack or stroke Have had blood clots Have been in close contact with someone who has tuberculosis (TB) Heart disease High blood pressure High cholesterol HIV or AIDS Infection or have had an infection that does not go away, such as tuberculosis (TB), shingles, hepatitis, herpes Kidney disease Live or have traveled to the [place] or the [place] or [place] Liver disease Low blood cell levels (white cells, red cells, and platelets) Lung or breathing disease, such as asthma or COPD Recent or upcoming vaccine Stomach or intestine problems Taking NSAIDs, medications for pain and inflammation, such as ibuprofen or naproxen Taking steroid medications, such as prednisone or cortisone Weakened immune system An unusual or allergic reaction to upadacitinib, other medications, foods, dyes, or preservatives Pregnant or trying to get pregnant Breastfeeding How should I use this medication? Take this medication by mouth with water. Take it as directed on the prescription label at the same time every day. Do not cut, crush, or chew this medication. Swallow the tablets whole. You can take it with or without food. If it upsets your stomach, take it with food. Keep taking it unless your care team tells you to stop. Do  not take this medication with grapefruit juice. A special MedGuide will be given to you by the pharmacist with each prescription and refill. Be sure to read this information carefully each time. Talk to your care team about the use of this medication in children. While it may be prescribed for children as young as 2 years for selected conditions, precautions do apply. Overdosage: If you think you have taken too much of this medicine contact a poison control center or emergency room at once. NOTE: This medicine is only for you. Do not share this medicine with others. What if I miss a dose? If you miss a dose, take it as soon as you can. If it is almost time for your next dose, take only that dose. Do not take double or extra doses. What may interact with this medication? Certain antivirals for HIV or hepatitis Certain medications for fungal infections, such as ketoconazole, itraconazole, posaconazole, voriconazole Certain medications for seizures, such as carbamazepine, phenobarbital, phenytoin Clarithromycin Grapefruit and grapefruit juice Live virus vaccines Medications that lower your chance of fighting infection, such as abatacept, adalimumab, azathioprine, cyclosporine, etanercept, infliximab, rituximab Rifampin Supplements, such as St. John's wort Other medications may affect the way this medication works. Talk with your care team about all of the medications you take. They may suggest changes to your treatment plan to lower the risk of side effects and to make sure your medications work as intended. This list may not describe all possible interactions. Give your health care provider a list of all the medicines, herbs, non-prescription drugs, or dietary supplements you use. Also tell them if you smoke, drink alcohol, or use illegal drugs. Some items  may interact with your medicine. What should I watch for while using this medication? Visit your care team for regular checks on your progress.  Tell your care team if your symptoms do not start to get better or if they get worse. You may need blood work done while you are taking this medication. This medication may increase your risk of getting an infection. Call your care team for advice if you get a fever, chills, sore throat, or other symptoms of a cold or flu. Do not treat yourself. Try to avoid being around people who are sick. Your care team will screen you for tuberculosis (TB) before you start this medication. If they think you are at risk, you may be treated with medication for TB. You should start taking the medication for TB before you start this medication. Make sure to finish the full course of TB medication. Talk to your care team about your vaccination history. To lower your risk of infection, you may need certain vaccines before you start this medication. Talk to your care team about your risk of cancer. You may be more at risk for certain types of cancer if you take this medication. Tobacco use may increase your risk of cancer. Talk to your care team about having your skin checked for cancer while taking this medication. Limit the amount of time you spend in the sun. Wear protective clothing and sunscreen when you are in the sun. Do not use sun lamps, tanning beds, or tanning booths. This medication may increase the risk of blood clots, heart attack, stroke, or death. High blood pressure, high cholesterol, diabetes, increased age, excess weight, and tobacco use increase this risk. Call emergency services right away if you have symptoms of a heart attack or stroke. This medication can increase bad cholesterol and fats (such as LDL, triglycerides) and decrease good cholesterol (HDL) in your blood. You may need blood tests to check your cholesterol. Ask your care team what you can do to lower your risk of high cholesterol while taking this medication. Tell your care team right away if you have any change in your eyesight. Talk to your  care team if you often see part of the tablet in your stool. Talk to your care team if you may be pregnant. Serious birth defects can occur if you take this medication during pregnancy and for 4 weeks after the last dose. You will need a negative pregnancy test before starting this medication. Contraception is recommended while taking this medication and for 4 weeks after the last dose. Your care team can help you find the option that works for you. Do not breastfeed while taking this medication and for 6 days after the last dose. What side effects may I notice from receiving this medication? Side effects that you should report to your care team as soon as possible: Allergic reactions--skin rash, itching, hives, swelling of the face, lips, tongue, or throat Blood clot--pain, swelling, or warmth in the leg, shortness of breath, chest pain Change in vision Heart attack--pain or tightness in the chest, shoulders, arms, or jaw, nausea, shortness of breath, cold or clammy skin, feeling faint or lightheaded Infection--fever, chills, cough, sore throat, wounds that don't heal, pain or trouble when passing urine, general feeling of discomfort or being unwell Liver injury--right upper belly pain, loss of appetite, nausea, light-colored stool, dark yellow or brown urine, yellowing skin or eyes, unusual weakness or fatigue Low red blood cell level--unusual weakness or fatigue, dizziness, headache, trouble breathing  Stomach pain that is severe, does not go away, or gets worse Stroke--sudden numbness or weakness of the face, arm, or leg, trouble speaking, confusion, trouble walking, loss of balance or coordination, dizziness, severe headache, change in vision Side effects that usually do not require medical attention (report these to your care team if they continue or are bothersome): Acne Cough Headache Nausea Runny or stuffy nose This list may not describe all possible side effects. Call your doctor for  medical advice about side effects. You may report side effects to FDA at 1-800-FDA-1088. Where should I keep my medication? Keep out of the reach of children and pets. Store at room temperature between 20 and 25 degrees C (68 and 77 degrees F). Protect from moisture. Keep the container tightly closed. Keep this medication in the original container until you are ready to take it. Get rid of any unused medication after the expiration date. To get rid of medications that are no longer needed or have expired: Take the medication to a medication take-back program. Check with your pharmacy or law enforcement to find a location. If you cannot return the medication, check the label or package insert to see if the medication should be thrown out in the garbage or flushed down the toilet. If you are not sure, ask your care team. If it is safe to put it in the trash, empty the medication out of the container. Mix the medication with cat litter, dirt, coffee grounds, or other unwanted substance. Seal the mixture in a bag or container. Put it in the trash. NOTE: This sheet is a summary. It may not cover all possible information. If you have questions about this medicine, talk to your doctor, pharmacist, or health care provider.  2024 Elsevier/Gold Standard (2023-07-02 00:00:00)

## 2023-11-04 NOTE — Progress Notes (Signed)
 Pharmacy Note  Subjective: Patient presents today to [facility] for follow up office visit. Patient seen by the pharmacist for counseling on Rinvoq for rheumatoid arthritis..  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 She has been holding her medication due to ongoing infection  History of diverticulitis:  No  History of MI, stroke, or CV events:  No  Objective:  CMP     Component Value Date/Time   NA 139 09/14/2023 1147   K 3.9 09/14/2023 1147   CL 103 09/14/2023 1147   CO2 26 09/14/2023 1147   GLUCOSE 97 09/14/2023 1147   BUN 12 09/14/2023 1147   CREATININE 0.64 09/14/2023 1147   CALCIUM 9.7 09/14/2023 1147   PROT 7.6 09/14/2023 1147   ALBUMIN 4.0 08/15/2023 1444   AST 17 09/14/2023 1147   AST 26 03/30/2022 0943   ALT 25 09/14/2023 1147   ALT 42 03/30/2022 0943   ALKPHOS 106 08/15/2023 1444    CBC    Component Value Date/Time   WBC 3.6 (L) 09/14/2023 1147   RBC 4.58 09/14/2023 1147   HGB 12.3 09/14/2023 1147   HGB 10.8 (L) 03/30/2022 0943   HCT 39.1 09/14/2023 1147   HCT 30.0 (L) 03/20/2019 1613   PLT 344 09/14/2023 1147   PLT 155 03/30/2022 0943   MCV 85.4 09/14/2023 1147   MCH 26.9 (L) 09/14/2023 1147   MCHC 31.5 (L) 09/14/2023 1147   RDW 15.9 (H) 09/14/2023 1147    Baseline Immunosuppressant Therapy Labs TB GOLD    Latest Ref Rng & Units 12/25/2022    9:03 AM  Quantiferon TB Gold  Quantiferon TB Gold Plus NEGATIVE NEGATIVE    Hepatitis Panel    Latest Ref Rng & Units 12/25/2022    9:03 AM  Hepatitis  Hep B Surface Ag NON-REACTIVE NON-REACTIVE   Hep B IgM NON-REACTIVE NON-REACTIVE   Hep C Ab NON-REACTIVE NON-REACTIVE    HIV Lab Results  Component Value Date   HIV Non Reactive 03/14/2023   HIV Non Reactive 02/07/2022   Immunoglobulins   SPEP    Latest Ref Rng & Units  09/14/2023   11:47 AM  Serum Protein Electrophoresis  Total Protein 6.1 - 8.1 g/dL 7.6    X5MW No results found for: "G6PDH" TPMT No results found for: "TPMT"   Lipid Panel Lab Results  Component Value Date   CHOL 196 09/14/2023   HDL 39 (L) 09/14/2023   LDLCALC 130 (H) 09/14/2023   TRIG 159 (H) 09/14/2023   CHOLHDL 5.0 (H) 09/14/2023     Assessment/Plan:  Counseled patient that Rinvoq is a JAK inhibitor indicated for Rheumatoid Arthritis.  Counseled patient on purpose, proper use, and adverse effects of Rinvoq.    Reviewed the most common adverse effects including infection, diarrhea, headaches.  Also reviewed rare adverse effects such as bowel injury and the need to contact us if they develop stomach pain during treatment. Counseled on the increase risk of venous thrombosis. Counseled about FDA black box warning of MACE (major adverse CV events including cardiovascular death, myocardial infarction, and stroke).  Reviewed with patient that there is the possibility of an increased risk of malignancy specifically lung cancer and lymphomas but it is not well understood if this increased risk is due to the medication or the disease state. Instructed patient that medication should be held for infection  and prior to surgery.  Advised patient to avoid live vaccines. Recommend annual influenza, PCV 15 or PCV20 or Pneumovax 23, and Shingrix as indicated.    Reviewed importance of routine lab monitoring including lipid panel.  Will recheck lipid panel 3 months after starting and annually thereafter. CBC and CMP will be monitored routinely every 3 months. Standing orders placed. Provided patient with medication education material and answered all questions.  Patient consented to Rinvoq.  Will upload into patient's chart.  Will apply through patient's insurance and update when we receive a response.    Patient dose will be 15 mg daily.  Prescription will be sent to pharmacy pending lab results and  insurance approval.   [name], PharmD, MPH, BCPS, CPP Clinical Pharmacist (Rheumatology and Pulmonology)

## 2023-11-05 ENCOUNTER — Other Ambulatory Visit (HOSPITAL_COMMUNITY): Payer: Self-pay

## 2023-11-05 ENCOUNTER — Encounter: Payer: Self-pay | Admitting: Hematology

## 2023-11-05 LAB — COMPREHENSIVE METABOLIC PANEL WITH GFR
AG Ratio: 1.7 (calc) (ref 1.0–2.5)
ALT: 33 U/L — ABNORMAL HIGH (ref 6–29)
AST: 19 U/L (ref 10–30)
Albumin: 4.5 g/dL (ref 3.6–5.1)
Alkaline phosphatase (APISO): 98 U/L (ref 31–125)
BUN: 14 mg/dL (ref 7–25)
CO2: 25 mmol/L (ref 20–32)
Calcium: 9.5 mg/dL (ref 8.6–10.2)
Chloride: 107 mmol/L (ref 98–110)
Creat: 0.63 mg/dL (ref 0.50–0.97)
Globulin: 2.7 g/dL (ref 1.9–3.7)
Glucose, Bld: 107 mg/dL — ABNORMAL HIGH (ref 65–99)
Potassium: 4.1 mmol/L (ref 3.5–5.3)
Sodium: 139 mmol/L (ref 135–146)
Total Bilirubin: 0.4 mg/dL (ref 0.2–1.2)
Total Protein: 7.2 g/dL (ref 6.1–8.1)
eGFR: 122 mL/min/{1.73_m2} (ref >=60)

## 2023-11-05 LAB — CBC WITH DIFFERENTIAL/PLATELET
Absolute Lymphocytes: 1283 {cells}/uL (ref 850–3900)
Absolute Monocytes: 459 {cells}/uL (ref 200–950)
Basophils Absolute: 21 {cells}/uL (ref 0–200)
Basophils Relative: 0.5 %
Eosinophils Absolute: 189 {cells}/uL (ref 15–500)
Eosinophils Relative: 4.6 %
HCT: 40.6 % (ref 35.0–45.0)
Hemoglobin: 13.1 g/dL (ref 11.7–15.5)
MCH: 28.2 pg (ref 27.0–33.0)
MCHC: 32.3 g/dL (ref 32.0–36.0)
MCV: 87.3 fL (ref 80.0–100.0)
MPV: 11.8 fL (ref 7.5–12.5)
Monocytes Relative: 11.2 %
Neutro Abs: 2148 {cells}/uL (ref 1500–7800)
Neutrophils Relative %: 52.4 %
Platelets: 205 10*3/uL (ref 140–400)
RBC: 4.65 10*6/uL (ref 3.80–5.10)
RDW: 17.1 % — ABNORMAL HIGH (ref 11.0–15.0)
Total Lymphocyte: 31.3 %
WBC: 4.1 10*3/uL (ref 3.8–10.8)

## 2023-11-05 LAB — SEDIMENTATION RATE: Sed Rate: 11 mm/h (ref 0–20)

## 2023-11-05 MED ORDER — ESTRADIOL 0.1 MG/GM VA CREA
1.0000 | TOPICAL_CREAM | Freq: Every day | VAGINAL | 3 refills | Status: AC
  Filled 2023-11-05: qty 42.5, 30d supply, fill #0

## 2023-11-08 ENCOUNTER — Other Ambulatory Visit (HOSPITAL_COMMUNITY): Payer: Self-pay

## 2023-11-08 NOTE — Progress Notes (Signed)
 Patient no longer taking medication. Possibly switching to Rinvoq. Disenrolling patient from [facility]

## 2023-11-10 ENCOUNTER — Telehealth: Payer: Self-pay

## 2023-11-10 ENCOUNTER — Other Ambulatory Visit (HOSPITAL_COMMUNITY): Payer: Self-pay

## 2023-11-10 DIAGNOSIS — M069 Rheumatoid arthritis, unspecified: Secondary | ICD-10-CM

## 2023-11-10 DIAGNOSIS — Z79899 Other long term (current) drug therapy: Secondary | ICD-10-CM

## 2023-11-10 NOTE — Telephone Encounter (Addendum)
 SAVED a Prior Authorization request to [facility] for [facility] via CoverMyMeds. Submission pending OV note to be signed  Key: [id number]   ----- Message from Murrell Redden sent at 11/04/2023 11:25 AM EDT ----- Pending OV note from today, please start Rinvoq BIV Dose: 15mg  po daily Dx: RA and HS

## 2023-11-15 ENCOUNTER — Other Ambulatory Visit: Payer: Self-pay | Admitting: Pharmacist

## 2023-11-15 ENCOUNTER — Other Ambulatory Visit (HOSPITAL_COMMUNITY): Payer: Self-pay

## 2023-11-15 MED ORDER — RINVOQ 15 MG PO TB24
15.0000 mg | ORAL_TABLET | Freq: Every day | ORAL | 0 refills | Status: DC
  Filled 2023-11-17 (×3): qty 90, 90d supply, fill #0

## 2023-11-15 NOTE — Telephone Encounter (Signed)
 Received notification from [facility] regarding a prior authorization for [facility]. Authorization has been APPROVED from 11/15/2023 to 11/14/2024. Approval letter sent to scan center.  Patient can fill through [facility]: [phone]   Authorization # [id number]  Rx sent to [facility] For onboarding  Geraldene Kleine, PharmD, MPH, BCPS, CPP Clinical Pharmacist (Rheumatology and Pulmonology)

## 2023-11-15 NOTE — Progress Notes (Signed)
 Patient counseled by clinical pharmacist about Rinvoq at OV on 11/04/2023. 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 She has been holding her medication due to ongoing infection  She can start Rinvoq once she is cleared of infections  Tammy Garrison, PharmD, MPH, BCPS, CPP Clinical Pharmacist (Rheumatology and Pulmonology)

## 2023-11-15 NOTE — Telephone Encounter (Signed)
 PA for Rinvoq submitted with clinicals  Key: [id number]   Geraldene Kleine, PharmD, MPH, BCPS, CPP Clinical Pharmacist (Rheumatology and Pulmonology)

## 2023-11-16 ENCOUNTER — Other Ambulatory Visit: Payer: Self-pay

## 2023-11-16 ENCOUNTER — Other Ambulatory Visit (HOSPITAL_COMMUNITY): Payer: Self-pay

## 2023-11-16 MED ORDER — AMLODIPINE BESYLATE 5 MG PO TABS
5.0000 mg | ORAL_TABLET | Freq: Every day | ORAL | 5 refills | Status: DC
  Filled 2023-11-16: qty 30, 30d supply, fill #0
  Filled 2024-01-02: qty 30, 30d supply, fill #1
  Filled 2024-01-24 – 2024-01-31 (×2): qty 30, 30d supply, fill #2

## 2023-11-16 MED ORDER — VITAMIN D (ERGOCALCIFEROL) 1.25 MG (50000 UNIT) PO CAPS
50000.0000 [IU] | ORAL_CAPSULE | ORAL | 5 refills | Status: DC
  Filled 2023-11-16: qty 4, 28d supply, fill #0
  Filled 2023-12-13: qty 4, 28d supply, fill #1
  Filled 2024-01-17: qty 4, 28d supply, fill #2
  Filled 2024-02-20: qty 4, 28d supply, fill #3
  Filled 2024-03-20: qty 4, 28d supply, fill #4
  Filled 2024-04-17: qty 4, 28d supply, fill #5

## 2023-11-17 ENCOUNTER — Other Ambulatory Visit (HOSPITAL_COMMUNITY): Payer: Self-pay

## 2023-11-17 ENCOUNTER — Encounter: Payer: Self-pay | Admitting: Hematology

## 2023-11-17 ENCOUNTER — Other Ambulatory Visit: Payer: Self-pay

## 2023-11-17 NOTE — Progress Notes (Signed)
 Specialty Pharmacy Initial Fill Coordination Note  Tammy Garrison is a 33 y.o. female contacted today regarding initial fill of specialty medication(s) Upadacitinib (Rinvoq)   Patient requested Delivery   Delivery date: 11/19/23   Verified address: [address] [place], [place] [address]   Medication will be filled on 11/18/23.   Patient is aware of $4 copayment, she states she has historically been using [facility] account and would like to continue doing so. Fill as 90-day supply.

## 2023-11-18 ENCOUNTER — Other Ambulatory Visit: Payer: Self-pay

## 2023-11-22 ENCOUNTER — Other Ambulatory Visit: Payer: Self-pay | Admitting: Physician Assistant

## 2023-11-22 ENCOUNTER — Other Ambulatory Visit (HOSPITAL_COMMUNITY): Payer: Self-pay

## 2023-11-23 ENCOUNTER — Other Ambulatory Visit: Payer: Self-pay

## 2023-11-23 ENCOUNTER — Encounter: Payer: Self-pay | Admitting: Hematology

## 2023-11-23 ENCOUNTER — Other Ambulatory Visit (HOSPITAL_COMMUNITY): Payer: Self-pay

## 2023-11-23 MED ORDER — PANTOPRAZOLE SODIUM 40 MG PO TBEC
40.0000 mg | DELAYED_RELEASE_TABLET | Freq: Two times a day (BID) | ORAL | 1 refills | Status: DC
  Filled 2023-11-23: qty 60, 30d supply, fill #0
  Filled 2024-01-02: qty 60, 30d supply, fill #1

## 2023-11-23 MED ORDER — DICLOFENAC SODIUM 75 MG PO TBEC
75.0000 mg | DELAYED_RELEASE_TABLET | Freq: Two times a day (BID) | ORAL | 2 refills | Status: AC | PRN
  Filled 2023-11-23: qty 60, 30d supply, fill #0
  Filled 2024-01-24: qty 60, 30d supply, fill #1
  Filled 2024-03-27: qty 60, 30d supply, fill #2

## 2023-11-29 ENCOUNTER — Encounter: Payer: Self-pay | Admitting: Hematology

## 2023-11-29 ENCOUNTER — Other Ambulatory Visit: Payer: Self-pay

## 2023-11-29 ENCOUNTER — Other Ambulatory Visit (HOSPITAL_COMMUNITY): Payer: Self-pay

## 2023-11-29 MED ORDER — BENZONATATE 100 MG PO CAPS
100.0000 mg | ORAL_CAPSULE | Freq: Three times a day (TID) | ORAL | 2 refills | Status: AC | PRN
Start: 2023-11-29 — End: ?
  Filled 2023-11-29: qty 30, 5d supply, fill #0
  Filled 2024-01-02: qty 30, 5d supply, fill #1

## 2023-11-29 MED ORDER — CETIRIZINE HCL 10 MG PO TABS
10.0000 mg | ORAL_TABLET | Freq: Every evening | ORAL | 2 refills | Status: DC
Start: 2023-11-29 — End: 2024-03-20
  Filled 2023-11-29: qty 30, 30d supply, fill #0
  Filled 2024-01-02: qty 30, 30d supply, fill #1
  Filled 2024-02-20: qty 30, 30d supply, fill #2

## 2023-11-30 ENCOUNTER — Other Ambulatory Visit (HOSPITAL_COMMUNITY): Payer: Self-pay

## 2023-11-30 NOTE — Assessment & Plan Note (Signed)
 cT2N0Mx, with hypermetabolic left axillary nodes, HPV(+) -presented with worsening rectal bleeding, began passing clots and was admitted 02/06/22 for anemia from blood loss. S/p hemorrhoidectomy on 02/08/22 by Dr. 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 -PET scan 02/26/22 showed: intense uptake to anus; no signs of solid organ or FDG-avid nodal metastasis within abdomen or pelvis, hypermetabolic left axillary lymph nodes.  Sequently left axillary lymph node biopsy was benign. -she established care with Dr. Cliffton Asters on 03/03/22. -s/p concurrent chemoRT with mitomycin/5FU 03/09/22 - 04/20/22, tolerated moderately well with significant rectal pain. -Restaging PET scan on July 08, 2022 showed persistent increased radiotracer uptake associated with the anus, suspicious for residual disease.  She also had persistent significant anal pain.  She underwent exam and a biopsy by Dr. Cliffton Asters in OR on July 21, 2022, which was negative for residual disease. -Her rectal pain has resolved, no clinical concern for residual cancer -I reviewed her separating CT scan from January 07, 2023, which showed new enlarged right and left axillary and subpectoral lymph nodes, and numerous mildly enlarged retroperitoneal and right iliac lymph nodes, just stable from last CT scan.  She does have rheumatoid arthritis with diffuse joint pain, her adenopathy are certainly possible reactive, however given her history of anal cancer, I will obtain a PET scan for further evaluation, to see if any need biopsy one of her lymph nodes. -PET 02/05/23 showed persistent increased radiotracer uptake associated with the anus compatible with known anal cancer. Hypermetabolic bilateral axillary, bilateral subpectoral, right supraclavicular, retroperitoneal and right iliac side chain lymph nodes concerning for nodal metastatic disease. -I recommend IR node biopsy,  she agrees  -she followed with Dr. Cliffton Asters and had anal scopy exam under anesthesia which was negative for residual cancer

## 2023-12-01 ENCOUNTER — Inpatient Hospital Stay: Payer: Medicaid Other | Attending: Hematology

## 2023-12-01 ENCOUNTER — Inpatient Hospital Stay (HOSPITAL_BASED_OUTPATIENT_CLINIC_OR_DEPARTMENT_OTHER): Payer: Medicaid Other | Admitting: Hematology

## 2023-12-01 ENCOUNTER — Other Ambulatory Visit: Payer: Self-pay

## 2023-12-01 ENCOUNTER — Encounter: Payer: Self-pay | Admitting: Hematology

## 2023-12-01 VITALS — BP 135/105 | HR 87 | Temp 97.6°F | Resp 17 | Ht 72.0 in | Wt 284.3 lb

## 2023-12-01 DIAGNOSIS — Z7952 Long term (current) use of systemic steroids: Secondary | ICD-10-CM | POA: Insufficient documentation

## 2023-12-01 DIAGNOSIS — Z85048 Personal history of other malignant neoplasm of rectum, rectosigmoid junction, and anus: Secondary | ICD-10-CM | POA: Diagnosis present

## 2023-12-01 DIAGNOSIS — C210 Malignant neoplasm of anus, unspecified: Secondary | ICD-10-CM

## 2023-12-01 DIAGNOSIS — Z79624 Long term (current) use of inhibitors of nucleotide synthesis: Secondary | ICD-10-CM | POA: Diagnosis not present

## 2023-12-01 DIAGNOSIS — Z9221 Personal history of antineoplastic chemotherapy: Secondary | ICD-10-CM | POA: Insufficient documentation

## 2023-12-01 LAB — CBC WITH DIFFERENTIAL (CANCER CENTER ONLY)
Abs Immature Granulocytes: 0.03 10*3/uL (ref 0.00–0.07)
Basophils Absolute: 0 10*3/uL (ref 0.0–0.1)
Basophils Relative: 0 %
Eosinophils Absolute: 0.1 10*3/uL (ref 0.0–0.5)
Eosinophils Relative: 1 %
HCT: 37.5 % (ref 36.0–46.0)
Hemoglobin: 12.2 g/dL (ref 12.0–15.0)
Immature Granulocytes: 0 %
Lymphocytes Relative: 32 %
Lymphs Abs: 2.5 10*3/uL (ref 0.7–4.0)
MCH: 29.2 pg (ref 26.0–34.0)
MCHC: 32.5 g/dL (ref 30.0–36.0)
MCV: 89.7 fL (ref 80.0–100.0)
Monocytes Absolute: 0.7 10*3/uL (ref 0.1–1.0)
Monocytes Relative: 9 %
Neutro Abs: 4.6 10*3/uL (ref 1.7–7.7)
Neutrophils Relative %: 58 %
Platelet Count: 259 10*3/uL (ref 150–400)
RBC: 4.18 MIL/uL (ref 3.87–5.11)
RDW: 14.6 % (ref 11.5–15.5)
WBC Count: 7.9 10*3/uL (ref 4.0–10.5)
nRBC: 0 % (ref 0.0–0.2)

## 2023-12-01 LAB — CMP (CANCER CENTER ONLY)
ALT: 24 U/L (ref 0–44)
AST: 17 U/L (ref 15–41)
Albumin: 4.4 g/dL (ref 3.5–5.0)
Alkaline Phosphatase: 87 U/L (ref 38–126)
Anion gap: 5 (ref 5–15)
BUN: 20 mg/dL (ref 6–20)
CO2: 28 mmol/L (ref 22–32)
Calcium: 9.2 mg/dL (ref 8.9–10.3)
Chloride: 106 mmol/L (ref 98–111)
Creatinine: 0.68 mg/dL (ref 0.44–1.00)
GFR, Estimated: >60 mL/min (ref >60)
Glucose, Bld: 84 mg/dL (ref 70–99)
Potassium: 3.9 mmol/L (ref 3.5–5.1)
Sodium: 139 mmol/L (ref 135–145)
Total Bilirubin: 0.4 mg/dL (ref 0.0–1.2)
Total Protein: 7.3 g/dL (ref 6.5–8.1)

## 2023-12-01 NOTE — Progress Notes (Signed)
 [facility]   [phone] Fax:([phone]   Clinic Follow up Note   Patient Care Team: Pa, [facility] as PCP - General (Internal Medicine) Alvester Aw, MD (Rheumatology) Concepcion Deck, MD as Consulting Physician (Obstetrics and Gynecology) Sonja Hamberg, MD as Consulting Physician (Hematology and Oncology)  Date of Service:  12/01/2023  CHIEF COMPLAINT: f/u of anal cancer  CURRENT THERAPY:  Cancer surveillance  Oncology History   Anal squamous cell carcinoma (HCC) cT2N0Mx, with hypermetabolic left axillary nodes, HPV(+) -presented with worsening rectal bleeding, began passing clots and was admitted 02/06/22 for anemia from blood loss. S/p hemorrhoidectomy on 02/08/22 by Dr. Hildy Lowers, pathology of a 4.2 cm invasive squamous cell carcinoma, with positive margin at excision.  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 -PET scan 02/26/22 showed: intense uptake to anus; no signs of solid organ or FDG-avid nodal metastasis within abdomen or pelvis, hypermetabolic left axillary lymph nodes.  Sequently left axillary lymph node biopsy was benign. -s/p concurrent chemoRT with mitomycin /5FU 03/09/22 - 04/20/22, tolerated moderately well with significant rectal pain. -Restaging PET scan on July 08, 2022 showed persistent increased radiotracer uptake associated with the anus, suspicious for residual disease.  She also had persistent significant anal pain.  She underwent exam and a biopsy by Dr. Camilo Cella in OR on July 21, 2022, which was negative for  -I reviewed her separating CT scan from January 07, 2023, which showed new enlarged right and left axillary and subpectoral lymph nodes, and numerous mildly enlarged retroperitoneal and right iliac lymph nodes, just stable from last CT scan.  She does have rheumatoid arthritis with diffuse joint pain, her adenopathy are certainly possible reactive, however given her history of anal cancer, I will obtain a PET scan for  further evaluation, to see if any need biopsy one of her lymph nodes. -PET 02/05/23 showed persistent increased radiotracer uptake associated with the anus compatible with known anal cancer. Hypermetabolic bilateral axillary, bilateral subpectoral, right supraclavicular, retroperitoneal and right iliac side chain lymph nodes concerning for nodal metastatic disease. -I recommend IR node biopsy, she agreed but did not proceed  -she followed with Dr. Camilo Cella and had anal scopy exam under anesthesia in 05/2023 which was negative for residual cancer   Assessment & Plan Anal cancer Anal cancer diagnosed in July 2023. Recent scan in February showed no recurrence. She is asymptomatic with no pain or bleeding. Residual rectal thickening is due to pre-chemotherapy radiation. Enlarged lymph nodes in the left axilla and chest are likely reactive to rheumatoid arthritis, with previous biopsy negative. Blood counts are normal; kidney and liver function tests and tumor markers are pending. Favorable outcome if no recurrence in two to three years, but she is not yet out of the woods. - Order scan in August 2025 - Schedule follow-up appointment in August 2025 - Perform lab tests one week prior to next appointment - Encourage follow-up with Dr. Camilo Cella for endoscopic evaluation  Rheumatoid arthritis Rheumatoid arthritis managed with low-dose prednisone  and Rinvoq . Previous treatments included leflunomide , Xeljanz , and injections, which caused illness. She is under the care of Dr. Rodell Citrin. - Continue current medications as prescribed by Dr. Rodell Citrin - Contact Dr. Rodell Citrin for appointment if needed  Acid reflux Acid reflux managed with medication. Last endoscopy in December 2024 showed small polyps which were removed. Follow-up with GI specialist Dr. Rosaline Coma is planned in two to three months. - Follow up with GI specialist Dr. Rosaline Coma in two to three months  Plan - She  is clinically doing well, no GI symptoms.  Limited to rectal  exam was negative today. - I encouraged her to call Dr. Camilo Cella for a follow-up in 2 months - Follow-up in 4 months with lab and CT scan 1 week before   SUMMARY OF ONCOLOGIC HISTORY: Oncology History Overview Note   Cancer Staging  Anal squamous cell carcinoma (HCC) Staging form: Anus, AJCC V9 - Clinical stage from 02/08/2022: Stage IIA (cT2, cN0, cM0) - Signed by Sonja [place], MD on 02/23/2022 Stage prefix: Initial diagnosis     Anal squamous cell carcinoma (HCC)  02/08/2022 Pathology Results   FINAL MICROSCOPIC DIAGNOSIS:   A. SOFT TISSUE, ANTERIOR MASS VS. HEMORRHOID, EXCISION:  Invasive squamous cell carcinoma, focally keratinizing and  well-differentiated.  Carcinoma is present at the margins of excision.   B. HEMORRHOID, INTERNAL, EXCISION:  Hemorrhoid without the overlying epithelium.  Separate fragment of benign squamous epithelium.  Negative for neoplasm.   ADDENDUM:  P16 immunostain is strongly positive suggestive of this lesion being the HPV related.    02/08/2022 Cancer Staging   Staging form: Anus, AJCC V9 - Clinical stage from 02/08/2022: Stage IIA (cT2, cN0, cM0) - Signed by Sonja [place], MD on 02/23/2022 Stage prefix: Initial diagnosis   02/23/2022 Initial Diagnosis   Anal cancer (HCC)   03/09/2022 - 03/13/2022 Chemotherapy   Patient is on Treatment Plan : ANUS Mitomycin  D1,28 / 5FU D1-4, 28-31 q32d     03/09/2022 -  Chemotherapy   Patient is on Treatment Plan : ANUS Mitomycin  D1,28 + 5FU D1-4, 28-31 q32d      PET scan   IMPRESSION: 1. There is persistent increased radiotracer uptake associated with the anus. The degree of uptake is similar to the previous exam. Cannot exclude residual FDG avid tumor. 2. No signs of solid organ or nodal metastasis within the chest, abdomen or pelvis. 3. Interval resolution of previous FDG avid left axillary lymph nodes. 4. Interval increase in radiotracer uptake associated with anterior mediastinal soft tissue. The configuration of the soft  tissue is consistent with the thymus gland. 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 5. Interval development of multiple FDG avid cutaneous soft tissue nodules within the anterior left shoulder, ventral chest wall, and anteromedial left thigh. These are nonspecific and may be inflammatory or infectious in etiology. Metastatic disease is considered less favored. Correlation with physical exam findings is advised. 6. Enlarged uterus containing degenerating fibroids.      Discussed the use of AI scribe software for clinical note transcription with the patient, who gave verbal consent to proceed.  History of Present Illness Tammy Garrison is a 33 year old female with rectal cancer who presents for follow-up.  Diagnosed with rectal cancer in July 2023, she remains asymptomatic with no pain or bleeding in the rectal area. Her last scan in February 2025 showed no recurrence. She experienced minor bleeding recently due to a hard stool but has no significant issues otherwise. She has not followed up with her oncologist due to an outstanding bill and insurance issues.  She has a history of enlarged lymph nodes, particularly on the left side in the armpit and chest, which were biopsied and found to be negative. Her current medications include pantoprazole , prilocaine, vitamin D , and Cymbalta .     All other systems were reviewed with the patient and are negative.  MEDICAL HISTORY:  Past Medical History:  Diagnosis Date   Anemia  Bipolar 1 disorder (HCC)    Cancer (HCC)    squamous cell rectal cancer   Diabetes (HCC)    PREDIABETIC   ETOH abuse    H/O self-harm    H/O suicide attempt    x 5 - last 05/2016 - overdose Ibuprofen    Marijuana abuse    Rheumatoid arthritis (HCC)     SURGICAL HISTORY: Past Surgical History:  Procedure Laterality Date   HEMORRHOID SURGERY N/A 02/08/2022   Procedure: EXTERNAL AND  INTERNAL HEMORRHOIDECTOMY;  Surgeon: Dorena Gander, MD;  Location: [facility] OR;  Service: General;  Laterality: N/A;   INCISION AND DRAINAGE ABSCESS Right 04/22/2022   Procedure: INCISION AND DRAINAGE axilla;  Surgeon: Dorrie Gaudier, Alphonso Aschoff, MD;  Location: [facility] ORS;  Service: General;  Laterality: Right;   RECTAL BIOPSY N/A 05/10/2023   Procedure: EXCISION OF LEFT PERIANAL NODULE;  Surgeon: Melvenia Stabs, MD;  Location: [facility] OR;  Service: General;  Laterality: N/A;   RECTAL EXAM UNDER ANESTHESIA N/A 07/21/2022   Procedure: RECTAL EXAM UNDER ANESTHESIA;  Surgeon: Melvenia Stabs, MD;  Location: [facility] OR;  Service: General;  Laterality: N/A;   RECTAL EXAM UNDER ANESTHESIA N/A 05/10/2023   Procedure: RECTAL EXAM UNDER ANESTHESIA;  Surgeon: Melvenia Stabs, MD;  Location: [facility] OR;  Service: General;  Laterality: N/A;  60   TRANSANAL HEMORRHOIDAL DEARTERIALIZATION N/A 07/21/2022   Procedure: ANAL CANAL BIOPSY;  Surgeon: Melvenia Stabs, MD;  Location: [facility] OR;  Service: General;  Laterality: N/A;    I have reviewed the social history and family history with the patient and they are unchanged from previous note.  ALLERGIES:  is allergic to coconut flavoring agent (non-screening).  MEDICATIONS:  Current Outpatient Medications  Medication Sig Dispense Refill   acetaminophen  (TYLENOL ) 325 MG tablet Take 2 tablets (650 mg total) by mouth every 6 (six) hours as needed for mild pain. (Patient not taking: Reported on 11/04/2023)     amLODipine  (NORVASC ) 5 MG tablet PLEASE DISREGARD (Patient not taking: Reported on 11/04/2023) 1 tablet 0   amLODipine  (NORVASC ) 5 MG tablet Take 1 tablet (5 mg total) by mouth daily. 30 tablet 5   amoxicillin -clavulanate (AUGMENTIN ) 875-125 MG tablet Take 1 tablet by mouth 2 (two) times a day for 10 days. 20 tablet 0   benzonatate (TESSALON) 100 MG capsule Take 1-2 capsules (100-200 mg total) by mouth every 8 (eight) hours as needed for cough and congestion. 30 capsule 2    cetirizine (ZYRTEC) 10 MG tablet Take 1 tablet (10 mg total) by mouth every evening as needed for allergies. 30 tablet 2   diclofenac  (VOLTAREN ) 75 MG EC tablet Take 1 tablet (75 mg total) by mouth 2 (two) times daily as needed with food . 60 tablet 2   dicyclomine  (BENTYL ) 20 MG tablet Take 1 tablet (20 mg total) by mouth 3 (three) times daily as needed for spasms. 50 tablet 0   doxycycline  (VIBRAMYCIN ) 100 MG capsule Take 1 capsule (100 mg total) by mouth 2 (two) times daily. 19 capsule 0   DULoxetine  (CYMBALTA ) 60 MG capsule Take 1 capsule (60 mg total) by mouth at bedtime. 90 capsule 0   estradiol  (ESTRACE ) 0.1 MG/GM vaginal cream Place 1 Applicatorful vaginally daily. 42.5 g 3   famotidine  (PEPCID ) 40 MG tablet Take 1 tablet (40 mg total) by mouth 2 (two) times daily. 60 tablet 11   gabapentin  (NEURONTIN ) 300 MG capsule Take 1 capsule (300 mg total) by mouth 2 (two) times daily. 60 capsule 5  hydrocortisone  (ANUSOL -HC) 2.5 % rectal cream Place 1 application rectally 2 (two) times daily for 7 days. 30 g 1   leflunomide  (ARAVA ) 20 MG tablet Take 1 tablet (20 mg total) by mouth at bedtime. 90 tablet 0   losartan  (COZAAR ) 50 MG tablet Take 1 tablet (50 mg total) by mouth daily. (Patient not taking: Reported on 11/04/2023) 30 tablet 2   losartan  (COZAAR ) 50 MG tablet Take 1 tablet (50 mg total) by mouth daily. (Patient not taking: Reported on 11/04/2023) 30 tablet 5   losartan  (COZAAR ) 50 MG tablet Take 1 tablet (50 mg total) by mouth daily. 30 tablet 5   pantoprazole  (PROTONIX ) 40 MG tablet Take 1 tablet (40 mg total) by mouth 2 (two) times daily before a meal. 60 tablet 1   predniSONE  (DELTASONE ) 5 MG tablet Take 2 tablets (10 mg total) by mouth daily with breakfast for 30 days, THEN 1 tablet (5 mg total) daily with breakfast. 60 tablet 1   rosuvastatin  (CRESTOR ) 10 MG tablet Take 1 tablet (10 mg total) by mouth daily. 90 tablet 0   tiZANidine  (ZANAFLEX ) 4 MG tablet Take 1 tablet (4 mg total) by mouth  2 (two) times daily as needed. 60 tablet 2   traZODone  (DESYREL ) 100 MG tablet Take 1 tablet (100 mg total) by mouth at bedtime as needed for sleep 30 tablet 2   Upadacitinib  ER (RINVOQ ) 15 MG TB24 Take 1 tablet (15 mg total) by mouth daily. 90 tablet 0   valACYclovir  (VALTREX ) 500 MG tablet Take 1 tablet (500 mg total) by mouth daily for prevention of outbreak 90 tablet 5   Vitamin D , Ergocalciferol , (DRISDOL ) 1.25 MG (50000 UNIT) CAPS capsule Take 1 capsule (50,000 Units total) by mouth once a week. 4 capsule 5   No current facility-administered medications for this visit.   Facility-Administered Medications Ordered in Other Visits  Medication Dose Route Frequency Provider Last Rate Last Admin   0.9 %  sodium chloride  infusion (Manually program via Guardrails IV Fluids)  250 mL Intravenous Once Sonja [place], MD       heparin  lock flush 100 unit/mL  250 Units Intracatheter Once Sonja [place], MD       sodium chloride  flush (NS) 0.9 % injection 10 mL  10 mL Intracatheter Once Sonja [place], MD       sodium chloride  flush (NS) 0.9 % injection 10 mL  10 mL Intracatheter Once Sonja [place], MD       sodium chloride  flush (NS) 0.9 % injection 10 mL  10 mL Intracatheter Once Sonja [place], MD        PHYSICAL EXAMINATION: ECOG PERFORMANCE STATUS: 1 - Symptomatic but completely ambulatory  Vitals:   12/01/23 1139 12/01/23 1141  BP: (!) 148/104 (!) 135/105  Pulse: 87   Resp: 17   Temp: 97.6 F (36.4 C)   SpO2: 100%    Wt Readings from Last 3 Encounters:  12/01/23 284 lb 5 oz (129 kg)  11/04/23 272 lb (123.4 kg)  09/28/23 270 lb (122.5 kg)     GENERAL:alert, no distress and comfortable SKIN: skin color, texture, turgor are normal, no rashes or significant lesions EYES: normal, Conjunctiva are pink and non-injected, sclera clear NECK: supple, thyroid  normal size, non-tender, without nodularity LYMPH:  no palpable lymphadenopathy in the cervical, axillary  LUNGS: clear to auscultation and percussion with  normal breathing effort HEART: regular rate & rhythm and no murmurs and no lower extremity edema ABDOMEN:abdomen soft, non-tender and normal bowel sounds Musculoskeletal:no  cyanosis of digits and no clubbing  NEURO: alert & oriented x 3 with fluent speech, no focal motor/sensory deficits  Physical Exam RECTAL: Anus and rectum normal on inspection and external palpation.  LABORATORY DATA:  I have reviewed the data as listed    Latest Ref Rng & Units 12/01/2023   11:22 AM 11/04/2023   11:24 AM 09/14/2023   11:47 AM  CBC  WBC 4.0 - 10.5 K/uL 7.9  4.1  3.6   Hemoglobin 12.0 - 15.0 g/dL 11.9  14.7  82.9   Hematocrit 36.0 - 46.0 % 37.5  40.6  39.1   Platelets 150 - 400 K/uL 259  205  344         Latest Ref Rng & Units 12/01/2023   11:22 AM 11/04/2023   11:24 AM 09/14/2023   11:47 AM  CMP  Glucose 70 - 99 mg/dL 84  562  97   BUN 6 - 20 mg/dL 20  14  12    Creatinine 0.44 - 1.00 mg/dL 1.30  8.65  7.84   Sodium 135 - 145 mmol/L 139  139  139   Potassium 3.5 - 5.1 mmol/L 3.9  4.1  3.9   Chloride 98 - 111 mmol/L 106  107  103   CO2 22 - 32 mmol/L 28  25  26    Calcium  8.9 - 10.3 mg/dL 9.2  9.5  9.7   Total Protein 6.5 - 8.1 g/dL 7.3  7.2  7.6   Total Bilirubin 0.0 - 1.2 mg/dL 0.4  0.4  0.6   Alkaline Phos 38 - 126 U/L 87     AST 15 - 41 U/L 17  19  17    ALT 0 - 44 U/L 24  33  25       RADIOGRAPHIC STUDIES: I have personally reviewed the radiological images as listed and agreed with the findings in the report. No results found.    Orders Placed This Encounter  Procedures   CT CHEST ABDOMEN PELVIS W CONTRAST    Standing Status:   Future    Expected Date:   03/22/2024    Expiration Date:   11/30/2024    If indicated for the ordered procedure, I authorize the administration of contrast media per Radiology protocol:   Yes    Does the patient have a contrast media/X-ray dye allergy?:   No    Preferred imaging location?:   [facility]    If indicated for the ordered procedure,  I authorize the administration of oral contrast media per Radiology protocol:   Yes   All questions were answered. The patient knows to call the clinic with any problems, questions or concerns. No barriers to learning was detected. The total time spent in the appointment was 25 minutes.     Sonja [place], MD 12/01/2023

## 2023-12-02 LAB — CEA (ACCESS): CEA (CHCC): <1 ng/mL (ref 0.00–5.00)

## 2023-12-03 ENCOUNTER — Other Ambulatory Visit: Payer: Self-pay

## 2023-12-03 ENCOUNTER — Other Ambulatory Visit: Payer: Self-pay | Admitting: Hematology

## 2023-12-13 ENCOUNTER — Other Ambulatory Visit: Payer: Self-pay

## 2023-12-22 NOTE — Progress Notes (Deleted)
 Office Visit Note  Patient: Tammy Garrison             Date of Birth: [birth year]           MRN: [id number]             PCP: [facility] Referring: [facility] Visit Date: 01/05/2024   Subjective:  No chief complaint on file.   History of Present Illness: Tammy Garrison is a 33 y.o. female here for follow up for seropositive RA on Leflunomide  20 mg daily and  Rinvoq  15 mg PO daily   Previous HPI 11/04/2023 Tammy Garrison is a 33 y.o. female here for follow up for seropositive RA on Leflunomide  20 mg daily and actemra  162 mg [place] q14days started since December.    She experiences persistent joint pain and stiffness, primarily affecting her hands and feet. Actemra  has not provided significant relief, and she continues to experience weakness and pain, particularly in her left hand. She describes difficulty with daily activities such as putting on socks, applying lotion, and sometimes showering due to the pain and weakness.   She has previously been treated with oral steroids for joint pain, which provided temporary relief. However, the pain returns within a day or two after stopping the steroids. She keeps a cane in her cart for days when her foot does not cooperate.   She reports frequent infections, including a recent sinus infection and COVID-19. After taking antibiotics, she often becomes sick again within a few days. She recently underwent testing for flu and COVID-19, which returned negative results earlier this week.   No significant swelling in her joints, but persistent pain and weakness are present. No numbness or loss of feeling in her hands.         Previous HPI 09/14/2023 Tammy Garrison is a 33 y.o. female here for follow up for seropositive RA on Leflunomide  20 mg daily and actemra  162 mg [place] q14days started since December.     She experiences persistent joint pain and swelling despite treatment with Actemra  injections.  She continues to take leflunomide . There has been no significant improvement in her inflammation. Her pain is particularly severe in her foot, hand, and knee. Her knee is described as 'really swollen' with significant pain at night, especially if kept straight for long periods, making it difficult to bend. Her left wrist is also swollen, and she cannot wear socks for long periods due to swelling in her foot, which makes walking difficult.   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 Pain medications are ineffective, particularly at night, leading to sleeplessness. No issues have been noted at the injection sites.   She often gets sick after taking Actemra , including catching a virus shortly after her last dose, which left her unwell for a week. She has not required steroids for recent infections but has been treated with antibiotics. No rashes or issues at the injection sites. Swelling is noted in multiple joints, including her left wrist and knees, with the left knee being less affected than the right.     Previous HPI 07/12/2023 Tammy Garrison is a 33 y.o. female here for follow up for seropositive RA on Leflunomide  20 mg daily. She reports persistent pain, particularly in the hands, which has limited their ability to perform tasks requiring manual dexterity. They also note improvement in their leg strength, as they are now able  to ambulate without a cane. Despite this, they report persistent swelling in their left foot, which occasionally prevents them from wearing socks.   To manage the ongoing pain, their primary care physician prescribed tizanidine , which has helped with mobility but has not improved their hand function. The patient also reports numbness in their hands and pain in their wrists. They have been experiencing pain in their shoulders when they sleep on their sides and have therefore been trying to sleep on  their back.   The patient also reports swelling and pain in their left foot, particularly when moving it side to side and up and down. They deny any pain in their toes.   In addition to these symptoms, the patient has been experiencing issues with their colon and is scheduled for a colonoscopy to investigate potential ulcers. They have been taking Tylenol  and nsaids, most recently oral diclofenac  for pain management, which they believe may have contributed to the potential ulcers. They deny any history of diverticulitis or blood clots.     Previous HPI 05/12/2023 Tammy Garrison is a 33 y.o. female here for follow up for seropositive RA on Leflunomide  20 mg daily.  After last visit on the prednisone  taper she felt a good improvement in symptoms while taking 60 mg and then 40 mg of prednisone  but started to get more pain and swelling back at 20 mg and less.  So far not sure if the Cymbalta  and leflunomide  is making a big impact on her symptoms.  Still has pretty severe pain with any pressure and very limited in her activities.  Her friend is needing to help out up to an hour every day with basic daily activities.  She has not noticed a difference with starting the low-dose Cymbalta  but no particular side effect.  She went for exam for nodule near the anus with biopsy pathology report was consistent for thrombosed hemorrhoid.   Previous HPI 04/07/2023 Tammy Garrison is a 33 y.o. female here for follow up for seropositive RA on Xeljanz  11 mg daily.  She developed an outbreak of painful vesicular rashes on gentle seen at the emergency department diagnosis for genital herpes outbreak with concern about this triggered by her Xeljanz  and steroid treatment.  She took oral antibiotics course and valacyclovir  with resolution of this outbreak.  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  When on the prednisone  taper at 60 and 40 mg had significant improvement. Since decreasing to 20 mg and stopping she has swelling and severe pain and stiffness again.  This interferes with her basic activities of daily living such as getting dressed and she is using a cane for support while walking.  Also currently feels some exacerbation of her anxiety and depressive symptoms off her mirtazapine  for the past month has not been to get back in with a primary care office yet for maintenance medication refills.   Previous HPI 02/24/2023 Kanchan Latrece Tollett is a 33 y.o. female here for follow up for seropositive RA on Xeljanz  11 mg daily and prednisone  10 mg daily.  She saw partial benefit when on the 30 mg prednisone  but has really not been doing well at any point since her last visit.  Still with joint pain and swelling in multiple areas.  Has trouble standing up due to leg pains and cannot  use her arms to push off easily due to her pain especially left elbow.  Over the interval she had follow-up scans with oncology including PET scan on July 5 this was concerning for hypermetabolic regional lymph nodes around the associated anal squamous cell cancer.  She was recommended for new scope and biopsy has not yet been established went over with him so far.  Currently symptoms completely prevent her from working and are limiting even for her ADLs requiring assistance to get back and forth to the bathroom and move around the house.   Previous HPI 12/25/22 Nicolasa Latrece Makepeace is a 33 y.o. female here for evaluation and management of seropositive rheumatoid arthritis.  Originally diagnosed with onset of symptoms and 2019 and saw Dr. Henrine Logan for this.  She was initially treated with methotrexate which was ineffective subsequently treated with Xeljanz  with a good improvement.  She discontinued treatment in 2021 due to cost being prohibitive also was in  disease remission at the time.  She continued to do well off of any maintenance therapy until last year after diagnosis with anal carcinoma then treated with concurrent chemoradiation therapy and redeveloped significant joint inflammation in multiple areas.  Her treatments and symptoms that put her out of work most of the past year.  The past few months joint inflammation is getting worse has been seen at the hospital multiple times and treated with prolonged steroid tapers twice in February and April.  She see symptom relief when started on the high-dose steroids typically with some starting to return near the end of the taper and then gets a severe flare again within a few days of discontinuing medicine.  Currently she is off any steroids for 4 days and has severe joint pain and swelling in multiple areas. She also has hidradenitis suppurativa with involvement of the axillary, intertriginous, and groin areas.  No current open cysts or drainage.  Does notice some hardened mildly tender area along the left lower breast.  She was never on prolonged antibiotics and does not see dermatology for this condition.  HS symptoms were also quiescent during the few years off treatment before her cancer last year. No history of frequent respiratory or urinary tract infections.  She has had problems with bleeding and blood clots associated with the anal cancer but not prior to this.  No shortness of breath or persistent cough.   DMARD Hx Xeljanz  - stopped 2021 due to cost and disease remission MTX - nonresponder   No Rheumatology ROS completed.   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   Bipolar 1 disorder (HCC) 02/23/2022   RA (rheumatoid arthritis) (HCC) 02/07/2022   Syncope 02/06/2022   ABLA (acute blood loss anemia) 02/06/2022   BRBPR (bright red blood per rectum) 02/06/2022   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   Depression 05/20/2016   Marijuana dependence (HCC) 05/19/2016    Past Medical History:  Diagnosis Date  Anemia    Bipolar 1 disorder (HCC)    Cancer (HCC)    squamous cell rectal cancer   Diabetes (HCC)    PREDIABETIC   ETOH abuse    H/O self-harm    H/O suicide attempt    x 5 - last 05/2016 - overdose Ibuprofen    Marijuana abuse    Rheumatoid arthritis (HCC)     Family History  Problem Relation Age of Onset   Lupus Mother    Diabetes Father    Hypertension Other    Colon cancer Neg Hx    Colon polyps Neg Hx    Stomach cancer Neg Hx    Past Surgical History:  Procedure Laterality Date   HEMORRHOID SURGERY N/A 02/08/2022   Procedure: EXTERNAL AND INTERNAL HEMORRHOIDECTOMY;  Surgeon: Dorena Gander, MD;  [facility];  Service: General;  Laterality: N/A;   INCISION AND DRAINAGE ABSCESS Right 04/22/2022   Procedure: INCISION AND DRAINAGE axilla;  Surgeon: Dorrie Gaudier, Alphonso Aschoff, MD;  Location: [facility] ORS;  Service: General;  Laterality: Right;   RECTAL BIOPSY N/A 05/10/2023   Procedure: EXCISION OF LEFT PERIANAL NODULE;  Surgeon: Melvenia Stabs, MD;  Location: [facility] OR;  Service: General;  Laterality: N/A;   RECTAL EXAM UNDER ANESTHESIA N/A 07/21/2022   Procedure: RECTAL EXAM UNDER ANESTHESIA;  Surgeon: Melvenia Stabs, MD;  Location: [facility] OR;  Service: General;  Laterality: N/A;   RECTAL EXAM UNDER ANESTHESIA N/A 05/10/2023   Procedure: RECTAL EXAM UNDER ANESTHESIA;   Surgeon: Melvenia Stabs, MD;  Location: [facility] OR;  Service: General;  Laterality: N/A;  60   TRANSANAL HEMORRHOIDAL DEARTERIALIZATION N/A 07/21/2022   Procedure: ANAL CANAL BIOPSY;  Surgeon: Melvenia Stabs, MD;  Location: [facility] OR;  Service: General;  Laterality: N/A;   Social History   Social History Narrative   Not on file    There is no immunization history on file for this patient.   Objective: Vital Signs: There were no vitals taken for this visit.   Physical Exam   Musculoskeletal Exam: ***  CDAI Exam: CDAI Score: -- Patient Global: --; Provider Global: -- Swollen: --; Tender: -- Joint Exam 01/05/2024   No joint exam has been documented for this visit   There is currently no information documented on the homunculus. Go to the Rheumatology activity and complete the homunculus joint exam.  Investigation: No additional findings.  Imaging: No results found.  Recent Labs: Lab Results  Component Value Date   WBC 7.9 12/01/2023   HGB 12.2 12/01/2023   PLT 259 12/01/2023   NA 139 12/01/2023   K 3.9 12/01/2023   CL 106 12/01/2023   CO2 28 12/01/2023   GLUCOSE 84 12/01/2023   BUN 20 12/01/2023   CREATININE 0.68 12/01/2023   BILITOT 0.4 12/01/2023   ALKPHOS 87 12/01/2023   AST 17 12/01/2023   ALT 24 12/01/2023   PROT 7.3 12/01/2023   ALBUMIN 4.4 12/01/2023   CALCIUM  9.2 12/01/2023   GFRAA >60 04/12/2018   QFTBGOLDPLUS NEGATIVE 12/25/2022    Speciality Comments: No specialty comments available.  Procedures:  No procedures performed Allergies: Coconut flavoring agent (non-screening)   Assessment / Plan:     Visit Diagnoses: No diagnosis found.  ***  Orders: No orders of the defined types were placed in this encounter.  No orders of the defined types were placed in this encounter.    Follow-Up Instructions: No follow-ups on file.   Glena Landau, RT  Note - This record  has been created using [employer].  Chart creation errors  have been sought, but may not always  have been located. Such creation errors do not reflect on  the standard of medical care.

## 2024-01-03 ENCOUNTER — Encounter: Payer: Self-pay | Admitting: Hematology

## 2024-01-03 ENCOUNTER — Other Ambulatory Visit: Payer: Self-pay

## 2024-01-03 ENCOUNTER — Other Ambulatory Visit (HOSPITAL_COMMUNITY): Payer: Self-pay

## 2024-01-05 ENCOUNTER — Ambulatory Visit: Admitting: Internal Medicine

## 2024-01-05 DIAGNOSIS — Z79899 Other long term (current) drug therapy: Secondary | ICD-10-CM

## 2024-01-05 DIAGNOSIS — M069 Rheumatoid arthritis, unspecified: Secondary | ICD-10-CM

## 2024-01-05 DIAGNOSIS — C210 Malignant neoplasm of anus, unspecified: Secondary | ICD-10-CM

## 2024-01-05 DIAGNOSIS — G894 Chronic pain syndrome: Secondary | ICD-10-CM

## 2024-01-17 ENCOUNTER — Other Ambulatory Visit (HOSPITAL_COMMUNITY): Payer: Self-pay

## 2024-01-18 ENCOUNTER — Other Ambulatory Visit (HOSPITAL_COMMUNITY): Payer: Self-pay

## 2024-01-18 ENCOUNTER — Other Ambulatory Visit: Payer: Self-pay

## 2024-01-18 MED ORDER — TRAZODONE HCL 100 MG PO TABS
100.0000 mg | ORAL_TABLET | Freq: Every evening | ORAL | 2 refills | Status: DC | PRN
  Filled 2024-01-18: qty 30, 30d supply, fill #0

## 2024-01-18 NOTE — Progress Notes (Signed)
 Office Visit Note  Patient: Tammy Garrison             Date of Birth: Feb 22, 1991           MRN: [id number]             PCP: [facility] Referring: [facility] Visit Date: 02/01/2024   Subjective:  Follow-up (Left sided body pain and swelling)   Discussed the use of AI scribe software for clinical note transcription with the patient, who gave verbal consent to proceed.  History of Present Illness   Tammy Garrison is a 33 y.o. female here for follow up for seropositive RA on Leflunomide  20 mg daily and  Rinvoq  15 mg PO daily. She presents with complaint of worsening joint pain and swelling especially on her left side.  Since discontinuing Actemra  and starting Rinvoq , she has experienced a significant exacerbation of her rheumatoid arthritis symptoms, describing her condition as 'back at square root one'. She endures daily pain and swelling, particularly in her joints, and describes a sensation of her 'joint is on fire'.  She is currently not taking leflunomide  and is uncertain about resuming it alongside Rinvoq . She is no longer on any prednisone  as well.  She reports swelling in her legs and ankles, with a recent incident of a loud pop in her ankle when getting up from the couch. She experiences numbness and tingling, particularly at the end of the day.  No recent illnesses have been noted, and her oncologist's recent evaluation was positive, with a follow-up CT scan scheduled for next month.      Previous HPI 11/04/2023 Tammy Garrison is a 33 y.o. female here for follow up for seropositive RA on Leflunomide  20 mg daily and actemra  162 mg Blanco q14days started since December.    She experiences persistent joint pain and stiffness, primarily affecting her hands and feet. Actemra  has not provided significant relief, and she continues to experience weakness and pain, particularly in her left hand. She describes difficulty with daily activities such  as putting on socks, applying lotion, and sometimes showering due to the pain and weakness.   She has previously been treated with oral steroids for joint pain, which provided temporary relief. However, the pain returns within a day or two after stopping the steroids. She keeps a cane in her cart for days when her foot does not cooperate.   She reports frequent infections, including a recent sinus infection and COVID-19. After taking antibiotics, she often becomes sick again within a few days. She recently underwent testing for flu and COVID-19, which returned negative results earlier this week.   No significant swelling in her joints, but persistent pain and weakness are present. No numbness or loss of feeling in her hands.         Previous HPI 09/14/2023 Tammy Garrison is a 33 y.o. female here for follow up for seropositive RA on Leflunomide  20 mg daily and actemra  162 mg  q14days started since December.     She experiences persistent joint pain and swelling despite treatment with Actemra  injections. She continues to take leflunomide . There has been no significant improvement in her inflammation. Her pain is particularly severe in her foot, hand, and knee. Her knee is described as 'really swollen' with significant pain at night, especially if kept straight for long periods, making it difficult to bend. Her left wrist is also swollen, and she cannot wear socks for long periods due to swelling  in her foot, which makes walking difficult.   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 Pain medications are ineffective, particularly at night, leading to sleeplessness. No issues have been noted at the injection sites.   She often gets sick after taking Actemra , including catching a virus shortly after her last dose, which left her unwell for a week. She has not required steroids for recent infections but has been treated  with antibiotics. No rashes or issues at the injection sites. Swelling is noted in multiple joints, including her left wrist and knees, with the left knee being less affected than the right.     Previous HPI 07/12/2023 Tammy Garrison is a 33 y.o. female here for follow up for seropositive RA on Leflunomide  20 mg daily. She reports persistent pain, particularly in the hands, which has limited their ability to perform tasks requiring manual dexterity. They also note improvement in their leg strength, as they are now able to ambulate without a cane. Despite this, they report persistent swelling in their left foot, which occasionally prevents them from wearing socks.   To manage the ongoing pain, their primary care physician prescribed tizanidine , which has helped with mobility but has not improved their hand function. The patient also reports numbness in their hands and pain in their wrists. They have been experiencing pain in their shoulders when they sleep on their sides and have therefore been trying to sleep on their back.   The patient also reports swelling and pain in their left foot, particularly when moving it side to side and up and down. They deny any pain in their toes.   In addition to these symptoms, the patient has been experiencing issues with their colon and is scheduled for a colonoscopy to investigate potential ulcers. They have been taking Tylenol  and nsaids, most recently oral diclofenac  for pain management, which they believe may have contributed to the potential ulcers. They deny any history of diverticulitis or blood clots.     Previous HPI 05/12/2023 Tammy Garrison is a 33 y.o. female here for follow up for seropositive RA on Leflunomide  20 mg daily.  After last visit on the prednisone  taper she felt a good improvement in symptoms while taking 60 mg and then 40 mg of prednisone  but started to get more pain and swelling back at 20 mg and less.  So far not sure  if the Cymbalta  and leflunomide  is making a big impact on her symptoms.  Still has pretty severe pain with any pressure and very limited in her activities.  Her friend is needing to help out up to an hour every day with basic daily activities.  She has not noticed a difference with starting the low-dose Cymbalta  but no particular side effect.  She went for exam for nodule near the anus with biopsy pathology report was consistent for thrombosed hemorrhoid.   Previous HPI 04/07/2023 Tammy Garrison is a 33 y.o. female here for follow up for seropositive RA on Xeljanz  11 mg daily.  She developed an outbreak of painful vesicular rashes on gentle seen at the emergency department diagnosis for genital herpes outbreak with concern about this triggered by her Xeljanz  and steroid treatment.  She took oral antibiotics course and valacyclovir  with resolution of this outbreak.  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  When on the prednisone  taper at 60 and 40 mg had significant improvement. Since decreasing to 20 mg and stopping she has swelling and severe pain and stiffness again.  This interferes with her basic activities of daily living such as getting dressed and she is using a cane for support while walking.  Also currently feels some exacerbation of her anxiety and depressive symptoms off her mirtazapine  for the past month has not been to get back in with a primary care office yet for maintenance medication refills.   Previous HPI 02/24/2023 Tammy Garrison is a 33 y.o. female here for follow up for seropositive RA on Xeljanz  11 mg daily and prednisone  10 mg daily.  She saw partial benefit when on the 30 mg prednisone  but has really not been doing well at any point since her last visit.  Still with joint pain and swelling in  multiple areas.  Has trouble standing up due to leg pains and cannot use her arms to push off easily due to her pain especially left elbow.  Over the interval she had follow-up scans with oncology including PET scan on July 5 this was concerning for hypermetabolic regional lymph nodes around the associated anal squamous cell cancer.  She was recommended for new scope and biopsy has not yet been established went over with him so far.  Currently symptoms completely prevent her from working and are limiting even for her ADLs requiring assistance to get back and forth to the bathroom and move around the house.   Previous HPI 12/25/22 Tammy Garrison is a 33 y.o. female here for evaluation and management of seropositive rheumatoid arthritis.  Originally diagnosed with onset of symptoms and 2019 and saw Dr. Leni for this.  She was initially treated with methotrexate which was ineffective subsequently treated with Xeljanz  with a good improvement.  She discontinued treatment in 2021 due to cost being prohibitive also was in disease remission at the time.  She continued to do well off of any maintenance therapy until last year after diagnosis with anal carcinoma then treated with concurrent chemoradiation therapy and redeveloped significant joint inflammation in multiple areas.  Her treatments and symptoms that put her out of work most of the past year.  The past few months joint inflammation is getting worse has been seen at the hospital multiple times and treated with prolonged steroid tapers twice in February and April.  She see symptom relief when started on the high-dose steroids typically with some starting to return near the end of the taper and then gets a severe flare again within a few days of discontinuing medicine.  Currently she is off any steroids for 4 days and has severe joint pain and swelling in multiple areas. She also has hidradenitis suppurativa with involvement of the axillary,  intertriginous, and groin areas.  No current open cysts or drainage.  Does notice some hardened mildly tender area along the left lower breast.  She was never on prolonged antibiotics and does not see dermatology for this condition.  HS symptoms were also quiescent during the few years off treatment before her cancer last year. No history of frequent respiratory or urinary tract infections.  She has had problems with bleeding and blood clots associated with the anal cancer but not prior to this.  No shortness of breath or persistent cough.   DMARD Hx Xeljanz  - stopped 2021 due to cost and disease remission MTX -  nonresponder   Review of Systems  Constitutional:  Positive for fatigue.  HENT:  Positive for mouth dryness. Negative for mouth sores.   Eyes:  Negative for dryness.  Respiratory:  Positive for shortness of breath.   Cardiovascular:  Positive for chest pain and palpitations.  Gastrointestinal:  Negative for blood in stool, constipation and diarrhea.  Endocrine: Positive for increased urination.  Genitourinary:  Positive for involuntary urination.  Musculoskeletal:  Positive for joint pain, gait problem, joint pain, joint swelling, myalgias, muscle weakness, morning stiffness, muscle tenderness and myalgias.  Skin:  Positive for color change, rash and sensitivity to sunlight. Negative for hair loss.  Allergic/Immunologic: Positive for susceptible to infections.  Neurological:  Positive for dizziness and headaches.  Hematological:  Positive for swollen glands.  Psychiatric/Behavioral:  Positive for depressed mood and sleep disturbance. The patient is nervous/anxious.     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   Bipolar 1 disorder (HCC) 02/23/2022   RA (rheumatoid arthritis) (HCC) 02/07/2022   Syncope 02/06/2022   ABLA (acute blood loss anemia) 02/06/2022   BRBPR (bright red blood per rectum) 02/06/2022   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   Depression 05/20/2016   Marijuana dependence (HCC) 05/19/2016    Past Medical History:  Diagnosis Date   Anemia    Bipolar 1 disorder (HCC)    Cancer (HCC)    squamous cell rectal cancer   Diabetes (HCC)    PREDIABETIC   ETOH abuse    H/O self-harm    H/O suicide attempt    x 5 - last 05/2016 - overdose Ibuprofen    Marijuana abuse    Rheumatoid arthritis (HCC)     Family History  Problem Relation Age of Onset   Lupus Mother    Diabetes Father    Hypertension Other    Colon cancer Neg Hx    Colon polyps Neg Hx    Stomach cancer Neg Hx    Past Surgical History:  Procedure Laterality Date   HEMORRHOID SURGERY N/A 02/08/2022   Procedure: EXTERNAL AND INTERNAL HEMORRHOIDECTOMY;  Surgeon: Sebastian Moles, MD;  Location: [facility] OR;  Service: General;  Laterality: N/A;   INCISION AND DRAINAGE ABSCESS Right 04/22/2022   Procedure: INCISION AND DRAINAGE axilla;  Surgeon: Stevie, Herlene Righter, MD;  Location: [facility] ORS;  Service: General;  Laterality: Right;   RECTAL BIOPSY N/A 05/10/2023   Procedure: EXCISION OF LEFT PERIANAL NODULE;  Surgeon: Teresa Lonni HERO, MD;  Location: [facility] OR;  Service: General;  Laterality: N/A;   RECTAL EXAM UNDER ANESTHESIA N/A 07/21/2022   Procedure: RECTAL EXAM UNDER ANESTHESIA;  Surgeon: Teresa Lonni HERO, MD;  Location: [facility] OR;  Service: General;  Laterality: N/A;   RECTAL EXAM UNDER ANESTHESIA  N/A 05/10/2023   Procedure: RECTAL EXAM UNDER ANESTHESIA;  Surgeon: Teresa Lonni HERO, MD;  Location: [facility] OR;  Service: General;  Laterality: N/A;  60   TRANSANAL HEMORRHOIDAL DEARTERIALIZATION N/A  07/21/2022   Procedure: ANAL CANAL BIOPSY;  Surgeon: Teresa Lonni HERO, MD;  Location: [facility] OR;  Service: General;  Laterality: N/A;   Social History   Social History Narrative   Not on file    There is no immunization history on file for this patient.   Objective: Vital Signs: BP 126/87 (BP Location: Left Arm, Patient Position: Sitting, Cuff Size: Large)   Pulse 93   Resp 15   Ht 6' (1.829 m)   Wt 297 lb (134.7 kg)   BMI 40.28 kg/m    Physical Exam Eyes:     Conjunctiva/sclera: Conjunctivae normal.  Cardiovascular:     Rate and Rhythm: Normal rate and regular rhythm.  Pulmonary:     Effort: Pulmonary effort is normal.     Breath sounds: Normal breath sounds.  Skin:    General: Skin is warm and dry.  Neurological:     Mental Status: She is alert.  Psychiatric:        Mood and Affect: Mood normal.      Musculoskeletal Exam:  Left shoulder pain on movement Left elbow tenderness with no swelling Decreased right wrist ROM, tenderness to pressure without radiation, left wrist tenderness without any swelling Fingers full ROM, tenderness across MCP joints without palpable synovitis, Left hand MCP jiont tenderness, no palpable synovitis Knees full ROM no tenderness or swelling Left ankle swelling and warmth, tenderness to pressure and with movement which is restricted Left 1st MTP swelling and tenderness  Investigation: No additional findings.  Imaging: CT Head Wo Contrast Result Date: 02/06/2024 CLINICAL DATA:  headache, HTN EXAM: CT HEAD WITHOUT CONTRAST TECHNIQUE: Contiguous axial images were obtained from the base of the skull through the vertex without intravenous contrast. RADIATION DOSE REDUCTION: This exam was performed according to the departmental dose-optimization  program which includes automated exposure control, adjustment of the mA and/or kV according to patient size and/or use of iterative reconstruction technique. COMPARISON:  CT head 05/18/2016 FINDINGS: Brain: No evidence of large-territorial acute infarction. No parenchymal hemorrhage. No mass lesion. No extra-axial collection. No mass effect or midline shift. No hydrocephalus. Basilar cisterns are patent. Vascular: No hyperdense vessel. Atherosclerotic calcifications are present within the cavernous internal carotid arteries. Skull: No acute fracture or focal lesion. Sinuses/Orbits: Bilateral maxillary sinus mucosal thickening. Otherwise paranasal sinuses and mastoid air cells are clear. The orbits are unremarkable. Other: None. IMPRESSION: No acute intracranial abnormality. Electronically Signed   By: Morgane  Naveau M.D.   On: 02/06/2024 21:08    Recent Labs: Lab Results  Component Value Date   WBC 4.7 02/06/2024   HGB 11.5 (L) 02/06/2024   PLT 231 02/06/2024   NA 139 02/06/2024   K 3.2 (L) 02/06/2024   CL 105 02/06/2024   CO2 25 02/06/2024   GLUCOSE 93 02/06/2024   BUN 10 02/06/2024   CREATININE 0.68 02/06/2024   BILITOT 1.0 02/06/2024   ALKPHOS 85 02/06/2024   AST 32 02/06/2024   ALT 53 (H) 02/06/2024   PROT 7.2 02/06/2024   ALBUMIN 4.1 02/06/2024   CALCIUM  9.3 02/06/2024   GFRAA >60 04/12/2018   QFTBGOLDPLUS NEGATIVE 12/25/2022    Speciality Comments: No specialty comments available.  Procedures:  No procedures performed Allergies: Coconut flavoring agent (non-screening) and Flavoring agent   Assessment / Plan:     Visit Diagnoses: Rheumatoid arthritis involving multiple sites, unspecified whether rheumatoid factor present (HCC) - Plan: Sedimentation rate, leflunomide  (ARAVA ) 20 MG tablet, Upadacitinib  ER (RINVOQ ) 15 MG TB24, predniSONE  (DELTASONE ) 5 MG  tablet Symptoms worsened after switching from Actemra  to Rinvoq  but also stopped leflunomide  and prednisone . Current regimen  not controlling symptoms well. Numbness, tingling, and ankle popping noted. - Checking sed rate for disease activity monitoring - Resume leflunomide  20 mg PO daily. - Resume prednisone  5 mg daily - Continue Rinvoq  15 mg PO daily - Reassess treatment effectiveness after resuming leflunomide  and prednisone .  High risk medication use - Initiate Rinvoq  15 mg PO daily, leflunomide  20 mg daily for now - Plan: CBC with Differential/Platelet, Comprehensive metabolic panel with GFR, Upadacitinib  ER (RINVOQ ) 15 MG TB24 Tolerating rinvoq  without incident after starting. No interval serious infections. Updating labs after drug start. - Checking CBC and CMP for medication monitoring - Checing Hgb A1c for monitoring with resuming maintenance low dose prednisone   Anal squamous cell carcinoma (HCC) - Ongoing follow up wit Dr. Lanny  Chronic pain syndrome - Cymbalta  60 mg daily - Plan: DULoxetine  (CYMBALTA ) 60 MG capsule  Orders: Orders Placed This Encounter  Procedures   Sedimentation rate   CBC with Differential/Platelet   Comprehensive metabolic panel with GFR   Hemoglobin A1c   Meds ordered this encounter  Medications   DULoxetine  (CYMBALTA ) 60 MG capsule    Sig: Take 1 capsule (60 mg total) by mouth at bedtime.    Dispense:  90 capsule    Refill:  0   leflunomide  (ARAVA ) 20 MG tablet    Sig: Take 1 tablet (20 mg total) by mouth at bedtime.    Dispense:  90 tablet    Refill:  0   Upadacitinib  ER (RINVOQ ) 15 MG TB24    Sig: Take 1 tablet (15 mg total) by mouth daily.    Dispense:  90 tablet    Refill:  0   predniSONE  (DELTASONE ) 5 MG tablet    Sig: Take 1 tablet (5 mg total) by mouth daily with breakfast.    Dispense:  90 tablet    Refill:  0     Follow-Up Instructions: Return in about 3 months (around 05/03/2024) for RA on GC/LEF/UPA.   Lonni LELON Ester, MD  Note - This record has been created using [employer].  Chart creation errors have been sought, but may not always   have been located. Such creation errors do not reflect on  the standard of medical care.

## 2024-01-24 ENCOUNTER — Other Ambulatory Visit (HOSPITAL_COMMUNITY): Payer: Self-pay

## 2024-01-24 MED ORDER — OXYBUTYNIN CHLORIDE ER 10 MG PO TB24
10.0000 mg | ORAL_TABLET | Freq: Every day | ORAL | 5 refills | Status: DC
  Filled 2024-01-24: qty 30, 30d supply, fill #0
  Filled 2024-02-20: qty 30, 30d supply, fill #1
  Filled 2024-03-20: qty 30, 30d supply, fill #2
  Filled 2024-04-17: qty 30, 30d supply, fill #3
  Filled 2024-05-26: qty 30, 30d supply, fill #4
  Filled 2024-07-01: qty 30, 30d supply, fill #5

## 2024-01-25 ENCOUNTER — Other Ambulatory Visit (HOSPITAL_COMMUNITY): Payer: Self-pay

## 2024-01-25 ENCOUNTER — Other Ambulatory Visit: Payer: Self-pay

## 2024-01-25 ENCOUNTER — Encounter: Payer: Self-pay | Admitting: Hematology

## 2024-01-31 ENCOUNTER — Other Ambulatory Visit (HOSPITAL_COMMUNITY): Payer: Self-pay

## 2024-01-31 ENCOUNTER — Other Ambulatory Visit: Payer: Self-pay | Admitting: Internal Medicine

## 2024-01-31 DIAGNOSIS — G894 Chronic pain syndrome: Secondary | ICD-10-CM

## 2024-02-01 ENCOUNTER — Encounter: Payer: Self-pay | Admitting: Internal Medicine

## 2024-02-01 ENCOUNTER — Ambulatory Visit: Attending: Internal Medicine | Admitting: Internal Medicine

## 2024-02-01 ENCOUNTER — Other Ambulatory Visit: Payer: Self-pay

## 2024-02-01 ENCOUNTER — Other Ambulatory Visit (HOSPITAL_COMMUNITY): Payer: Self-pay

## 2024-02-01 VITALS — BP 126/87 | HR 93 | Resp 15 | Ht 72.0 in | Wt 297.0 lb

## 2024-02-01 DIAGNOSIS — R739 Hyperglycemia, unspecified: Secondary | ICD-10-CM | POA: Insufficient documentation

## 2024-02-01 DIAGNOSIS — M069 Rheumatoid arthritis, unspecified: Secondary | ICD-10-CM | POA: Diagnosis present

## 2024-02-01 DIAGNOSIS — Z7952 Long term (current) use of systemic steroids: Secondary | ICD-10-CM | POA: Diagnosis present

## 2024-02-01 DIAGNOSIS — G894 Chronic pain syndrome: Secondary | ICD-10-CM | POA: Diagnosis present

## 2024-02-01 DIAGNOSIS — C210 Malignant neoplasm of anus, unspecified: Secondary | ICD-10-CM | POA: Insufficient documentation

## 2024-02-01 DIAGNOSIS — Z79899 Other long term (current) drug therapy: Secondary | ICD-10-CM | POA: Diagnosis present

## 2024-02-01 MED ORDER — RINVOQ 15 MG PO TB24
15.0000 mg | ORAL_TABLET | Freq: Every day | ORAL | 0 refills | Status: DC
  Filled 2024-02-01 – 2024-02-07 (×2): qty 90, 90d supply, fill #0

## 2024-02-01 MED ORDER — LEFLUNOMIDE 20 MG PO TABS
20.0000 mg | ORAL_TABLET | Freq: Every day | ORAL | 0 refills | Status: DC
  Filled 2024-02-01: qty 90, 90d supply, fill #0

## 2024-02-01 MED ORDER — PREDNISONE 5 MG PO TABS
5.0000 mg | ORAL_TABLET | Freq: Every day | ORAL | 0 refills | Status: DC
Start: 2024-02-01 — End: 2024-03-29
  Filled 2024-02-01: qty 30, 30d supply, fill #0
  Filled 2024-02-25: qty 30, 30d supply, fill #1
  Filled 2024-03-27: qty 30, 30d supply, fill #2

## 2024-02-01 MED ORDER — DULOXETINE HCL 60 MG PO CPEP
60.0000 mg | ORAL_CAPSULE | Freq: Every day | ORAL | 0 refills | Status: DC
  Filled 2024-02-01: qty 90, 90d supply, fill #0

## 2024-02-01 MED ORDER — ROSUVASTATIN CALCIUM 10 MG PO TABS
10.0000 mg | ORAL_TABLET | Freq: Every day | ORAL | 0 refills | Status: DC
  Filled 2024-02-01: qty 90, 90d supply, fill #0

## 2024-02-01 NOTE — Telephone Encounter (Signed)
 Last Fill: 11/04/2023  Next Visit: 02/01/2024  Last Visit: 11/04/2023  Dx: Chronic pain syndrome   Current Dose per office note on 11/04/2023: Cymbalta  60 mg daily   Okay to refill Cymbalta ?

## 2024-02-02 ENCOUNTER — Encounter: Payer: Self-pay | Admitting: Hematology

## 2024-02-02 ENCOUNTER — Ambulatory Visit: Payer: Self-pay | Admitting: Internal Medicine

## 2024-02-02 LAB — CBC WITH DIFFERENTIAL/PLATELET
Absolute Lymphocytes: 1467 {cells}/uL (ref 850–3900)
Absolute Monocytes: 365 {cells}/uL (ref 200–950)
Basophils Absolute: 9 {cells}/uL (ref 0–200)
Basophils Relative: 0.2 %
Eosinophils Absolute: 72 {cells}/uL (ref 15–500)
Eosinophils Relative: 1.6 %
HCT: 36.6 % (ref 35.0–45.0)
Hemoglobin: 11.5 g/dL — ABNORMAL LOW (ref 11.7–15.5)
MCH: 29.6 pg (ref 27.0–33.0)
MCHC: 31.4 g/dL — ABNORMAL LOW (ref 32.0–36.0)
MCV: 94.1 fL (ref 80.0–100.0)
MPV: 11 fL (ref 7.5–12.5)
Monocytes Relative: 8.1 %
Neutro Abs: 2588 {cells}/uL (ref 1500–7800)
Neutrophils Relative %: 57.5 %
Platelets: 248 Thousand/uL (ref 140–400)
RBC: 3.89 10*6/uL (ref 3.80–5.10)
RDW: 13.1 % (ref 11.0–15.0)
Total Lymphocyte: 32.6 %
WBC: 4.5 Thousand/uL (ref 3.8–10.8)

## 2024-02-02 LAB — HEMOGLOBIN A1C
Hgb A1c MFr Bld: 5.9 % — ABNORMAL HIGH (ref <5.7)
Mean Plasma Glucose: 123 mg/dL
eAG (mmol/L): 6.8 mmol/L

## 2024-02-02 LAB — COMPREHENSIVE METABOLIC PANEL WITH GFR
AG Ratio: 1.9 (calc) (ref 1.0–2.5)
ALT: 53 U/L — ABNORMAL HIGH (ref 6–29)
AST: 33 U/L — ABNORMAL HIGH (ref 10–30)
Albumin: 4.4 g/dL (ref 3.6–5.1)
Alkaline phosphatase (APISO): 83 U/L (ref 31–125)
BUN: 14 mg/dL (ref 7–25)
CO2: 26 mmol/L (ref 20–32)
Calcium: 9.5 mg/dL (ref 8.6–10.2)
Chloride: 106 mmol/L (ref 98–110)
Creat: 0.64 mg/dL (ref 0.50–0.97)
Globulin: 2.3 g/dL (ref 1.9–3.7)
Glucose, Bld: 124 mg/dL — ABNORMAL HIGH (ref 65–99)
Potassium: 4 mmol/L (ref 3.5–5.3)
Sodium: 140 mmol/L (ref 135–146)
Total Bilirubin: 0.5 mg/dL (ref 0.2–1.2)
Total Protein: 6.7 g/dL (ref 6.1–8.1)
eGFR: 121 mL/min/1.73m2 (ref >=60)

## 2024-02-02 LAB — SEDIMENTATION RATE: Sed Rate: 14 mm/h (ref 0–20)

## 2024-02-02 NOTE — Progress Notes (Signed)
 Sed rate remains good at 14 despite the increase in some joint pain at our visit.  Blood counts remain fine.  Her liver enzyme test increased slightly with AST of 33 and ALT of 53.  This did not very significant appearing elevations.  I would still like to see how she does when able to take the Rinvoq  along with the prednisone  and leflunomide  consistently.  If symptoms do not improve more or her lab numbers trend up more significantly we could potentially switch back to injectable medicine at the next visit.

## 2024-02-03 ENCOUNTER — Other Ambulatory Visit: Payer: Self-pay

## 2024-02-06 ENCOUNTER — Other Ambulatory Visit: Payer: Self-pay

## 2024-02-06 ENCOUNTER — Emergency Department (HOSPITAL_COMMUNITY)

## 2024-02-06 ENCOUNTER — Emergency Department (HOSPITAL_COMMUNITY)
Admission: EM | Admit: 2024-02-06 | Discharge: 2024-02-06 | Disposition: A | Discharge status: Home / Self Care | Attending: Emergency Medicine | Admitting: Emergency Medicine

## 2024-02-06 DIAGNOSIS — Z79899 Other long term (current) drug therapy: Secondary | ICD-10-CM | POA: Diagnosis not present

## 2024-02-06 DIAGNOSIS — E876 Hypokalemia: Secondary | ICD-10-CM | POA: Diagnosis not present

## 2024-02-06 DIAGNOSIS — R519 Headache, unspecified: Secondary | ICD-10-CM | POA: Diagnosis present

## 2024-02-06 DIAGNOSIS — Z720 Tobacco use: Secondary | ICD-10-CM | POA: Diagnosis not present

## 2024-02-06 DIAGNOSIS — I10 Essential (primary) hypertension: Secondary | ICD-10-CM | POA: Insufficient documentation

## 2024-02-06 LAB — CBC
HCT: 35.9 % — ABNORMAL LOW (ref 36.0–46.0)
Hemoglobin: 11.5 g/dL — ABNORMAL LOW (ref 12.0–15.0)
MCH: 30.6 pg (ref 26.0–34.0)
MCHC: 32 g/dL (ref 30.0–36.0)
MCV: 95.5 fL (ref 80.0–100.0)
Platelets: 231 K/uL (ref 150–400)
RBC: 3.76 MIL/uL — ABNORMAL LOW (ref 3.87–5.11)
RDW: 12.9 % (ref 11.5–15.5)
WBC: 4.7 K/uL (ref 4.0–10.5)
nRBC: 0 % (ref 0.0–0.2)

## 2024-02-06 LAB — COMPREHENSIVE METABOLIC PANEL WITH GFR
ALT: 53 U/L — ABNORMAL HIGH (ref 0–44)
AST: 32 U/L (ref 15–41)
Albumin: 4.1 g/dL (ref 3.5–5.0)
Alkaline Phosphatase: 85 U/L (ref 38–126)
Anion gap: 9 (ref 5–15)
BUN: 10 mg/dL (ref 6–20)
CO2: 25 mmol/L (ref 22–32)
Calcium: 9.3 mg/dL (ref 8.9–10.3)
Chloride: 105 mmol/L (ref 98–111)
Creatinine, Ser: 0.68 mg/dL (ref 0.44–1.00)
GFR, Estimated: >60 mL/min (ref >60)
Glucose, Bld: 93 mg/dL (ref 70–99)
Potassium: 3.2 mmol/L — ABNORMAL LOW (ref 3.5–5.1)
Sodium: 139 mmol/L (ref 135–145)
Total Bilirubin: 1 mg/dL (ref 0.0–1.2)
Total Protein: 7.2 g/dL (ref 6.5–8.1)

## 2024-02-06 LAB — HCG, SERUM, QUALITATIVE: Preg, Serum: NEGATIVE

## 2024-02-06 MED ORDER — DIPHENHYDRAMINE HCL 50 MG/ML IJ SOLN
25.0000 mg | Freq: Once | INTRAMUSCULAR | Status: AC
  Administered 2024-02-06: 25 mg via INTRAVENOUS
  Filled 2024-02-06: qty 1

## 2024-02-06 MED ORDER — METOCLOPRAMIDE HCL 5 MG/ML IJ SOLN
10.0000 mg | Freq: Once | INTRAMUSCULAR | Status: AC
  Administered 2024-02-06: 10 mg via INTRAVENOUS
  Filled 2024-02-06: qty 2

## 2024-02-06 MED ORDER — KETOROLAC TROMETHAMINE 15 MG/ML IJ SOLN
15.0000 mg | Freq: Once | INTRAMUSCULAR | Status: AC
  Administered 2024-02-06: 15 mg via INTRAVENOUS
  Filled 2024-02-06: qty 1

## 2024-02-06 MED ORDER — SODIUM CHLORIDE 0.9 % IV BOLUS
1000.0000 mL | Freq: Once | INTRAVENOUS | Status: AC
  Administered 2024-02-06: 1000 mL via INTRAVENOUS

## 2024-02-06 NOTE — Discharge Instructions (Signed)
 Continue Tylenol /ibuprofen  as needed for headache symptoms.  Keep your scheduled appointment with your primary care provider on Wednesday to further discuss your blood pressure management, for now continue your blood pressure medications as previously directed.  Return to the emergency department if your symptoms worsen.

## 2024-02-06 NOTE — ED Notes (Signed)
 Discharge instructions reviewed with patient. Patient questions answered and opportunity for education reviewed. Patient voices understanding of discharge instructions with no further questions. Patient ambulatory with steady gait to lobby.

## 2024-02-06 NOTE — ED Triage Notes (Signed)
 Pt complaining of headache that has come and gone for 2 weeks causing her BP to be elevated. Pt states that she is having a Rheumatoid Arthritis flare up.

## 2024-02-06 NOTE — ED Notes (Signed)
 Pts visual acuity results are 20/50 in both the right and left eye. Pt states they do not wear glasses or contacts normally.

## 2024-02-06 NOTE — ED Provider Notes (Signed)
 [facility] EMERGENCY DEPARTMENT AT [facility] Provider Note   CSN: [id number] Arrival date & time: 02/06/24  1446     Patient presents with: Headache and Hypertension   Tammy Garrison is a 33 y.o. female.   33 year old female presenting with headache, hypertension.  Patient has had frontal headache for 2 days that she describes as constant.  She also notes blurred vision in her left eye and photophobia bilaterally.  She has tried Tylenol  for her headache with little relief.  She is on amlodipine  and losartan  for blood pressure control, she is compliant with these medications and last took them around 7:30 AM.  Denies nausea, vomiting, chest pain, shortness of breath, abdominal pain.   Headache Hypertension Associated symptoms include headaches.       Prior to Admission medications   Medication Sig Start Date End Date Taking? Authorizing Provider  acetaminophen  (TYLENOL ) 325 MG tablet Take 2 tablets (650 mg total) by mouth every 6 (six) hours as needed for mild pain. 12/01/22   Jadine Toribio SQUIBB, MD  amLODipine  (NORVASC ) 5 MG tablet Take 1 tablet (5 mg total) by mouth daily. 11/16/23     amoxicillin -clavulanate (AUGMENTIN ) 875-125 MG tablet Take 1 tablet by mouth 2 (two) times a day for 10 days. Patient not taking: Reported on 02/01/2024 10/24/23     benzonatate  (TESSALON ) 100 MG capsule Take 1-2 capsules (100-200 mg total) by mouth every 8 (eight) hours as needed for cough and congestion. 11/29/23     cetirizine  (ZYRTEC ) 10 MG tablet Take 1 tablet (10 mg total) by mouth every evening as needed for allergies. Patient taking differently: Take 10 mg by mouth as needed. 11/29/23     diclofenac  (VOLTAREN ) 75 MG EC tablet Take 1 tablet (75 mg total) by mouth 2 (two) times daily as needed with food . 11/23/23     dicyclomine  (BENTYL ) 20 MG tablet Take 1 tablet (20 mg total) by mouth 3 (three) times daily as needed for spasms. 11/03/23   Craig Alan SAUNDERS, PA-C  doxycycline   (VIBRAMYCIN ) 100 MG capsule Take 1 capsule (100 mg total) by mouth 2 (two) times daily. 08/15/23   Small, Brooke L, PA  DULoxetine  (CYMBALTA ) 60 MG capsule Take 1 capsule (60 mg total) by mouth at bedtime. 02/01/24   Rice, Lonni ORN, MD  famotidine  (PEPCID ) 40 MG tablet Take 1 tablet (40 mg total) by mouth 2 (two) times daily. 09/28/23   Beather Delon Gibson, PA  gabapentin  (NEURONTIN ) 300 MG capsule Take 1 capsule (300 mg total) by mouth 2 (two) times daily. 09/01/23     hydrocortisone  (ANUSOL -HC) 2.5 % rectal cream Place 1 application rectally 2 (two) times daily for 7 days. Patient not taking: Reported on 02/01/2024 07/13/23   Federico Rosario BROCKS, MD  leflunomide  (ARAVA ) 20 MG tablet Take 1 tablet (20 mg total) by mouth at bedtime. 02/01/24   Jeannetta Lonni ORN, MD  losartan  (COZAAR ) 50 MG tablet Take 1 tablet (50 mg total) by mouth daily. Patient not taking: Reported on 02/01/2024 08/18/23     losartan  (COZAAR ) 50 MG tablet Take 1 tablet (50 mg total) by mouth daily. Patient not taking: Reported on 02/01/2024 10/13/23   Shelda Atlas, MD  losartan  (COZAAR ) 50 MG tablet Take 1 tablet (50 mg total) by mouth daily. 10/13/23   Avbuere, Edwin, MD  oxybutynin  (DITROPAN -XL) 10 MG 24 hr tablet Take 1 tablet (10 mg total) by mouth daily with dinner. 01/24/24     pantoprazole  (PROTONIX ) 40 MG tablet  Take 1 tablet (40 mg total) by mouth 2 (two) times daily before a meal. 11/23/23 02/02/24  Craig Alan SAUNDERS, PA-C  predniSONE  (DELTASONE ) 5 MG tablet Take 1 tablet (5 mg total) by mouth daily with breakfast. 02/01/24   Rice, Lonni ORN, MD  rosuvastatin  (CRESTOR ) 10 MG tablet Take 1 tablet (10 mg total) by mouth daily. 02/01/24     tiZANidine  (ZANAFLEX ) 4 MG tablet Take 1 tablet (4 mg total) by mouth 2 (two) times daily as needed. 11/04/23   Rice, Lonni ORN, MD  traZODone  (DESYREL ) 100 MG tablet Take 1 tablet (100 mg total) by mouth at bedtime as needed for sleep. 01/18/24     Upadacitinib  ER (RINVOQ ) 15 MG TB24 Take 1  tablet (15 mg total) by mouth daily. 02/01/24   Rice, Lonni ORN, MD  valACYclovir  (VALTREX ) 500 MG tablet Take 1 tablet (500 mg total) by mouth daily for prevention of outbreak 10/21/23     Vitamin D , Ergocalciferol , (DRISDOL ) 1.25 MG (50000 UNIT) CAPS capsule Take 1 capsule (50,000 Units total) by mouth once a week. 11/16/23       Allergies: Coconut flavoring agent (non-screening) and Flavoring agent    Review of Systems  Neurological:  Positive for headaches.    Updated Vital Signs  Vitals:   02/06/24 2102 02/06/24 2130 02/06/24 2145 02/06/24 2200  BP:  (!) 160/96 (!) 152/88 (!) 163/92  Pulse:  86 86 85  Resp:    17  Temp: 98 F (36.7 C)     TempSrc: Oral     SpO2:  96% 96% 96%  Weight:      Height:         Physical Exam Vitals and nursing note reviewed.  HENT:     Head: Normocephalic.  Eyes:     Extraocular Movements: Extraocular movements intact.     Pupils: Pupils are equal, round, and reactive to light.     Comments: Photophobia  Cardiovascular:     Rate and Rhythm: Normal rate and regular rhythm.  Pulmonary:     Effort: Pulmonary effort is normal.     Breath sounds: Normal breath sounds.  Abdominal:     Palpations: Abdomen is soft.  Musculoskeletal:     Cervical back: Normal range of motion and neck supple. No rigidity or tenderness.     Comments: Moves all extremities spontaneously without difficulty  Skin:    General: Skin is warm and dry.  Neurological:     Mental Status: She is alert and oriented to person, place, and time.     Sensory: No sensory deficit.     Motor: No weakness.     Comments: Oriented to self, situation, location, year but states that it is June Normal cerebellar testing including finger-to-nose and rapid alternating movements Facial expressions are symmetric without evidence of facial droop     (all labs ordered are listed, but only abnormal results are displayed) Labs Reviewed  CBC - Abnormal; Notable for the following  components:      Result Value   RBC 3.76 (*)    Hemoglobin 11.5 (*)    HCT 35.9 (*)    All other components within normal limits  COMPREHENSIVE METABOLIC PANEL WITH GFR - Abnormal; Notable for the following components:   Potassium 3.2 (*)    ALT 53 (*)    All other components within normal limits  HCG, SERUM, QUALITATIVE    EKG: None  Radiology: CT Head Wo Contrast Result Date: 02/06/2024 CLINICAL DATA:  headache, HTN EXAM: CT HEAD WITHOUT CONTRAST TECHNIQUE: Contiguous axial images were obtained from the base of the skull through the vertex without intravenous contrast. RADIATION DOSE REDUCTION: This exam was performed according to the departmental dose-optimization program which includes automated exposure control, adjustment of the mA and/or kV according to patient size and/or use of iterative reconstruction technique. COMPARISON:  CT head 05/18/2016 FINDINGS: Brain: No evidence of large-territorial acute infarction. No parenchymal hemorrhage. No mass lesion. No extra-axial collection. No mass effect or midline shift. No hydrocephalus. Basilar cisterns are patent. Vascular: No hyperdense vessel. Atherosclerotic calcifications are present within the cavernous internal carotid arteries. Skull: No acute fracture or focal lesion. Sinuses/Orbits: Bilateral maxillary sinus mucosal thickening. Otherwise paranasal sinuses and mastoid air cells are clear. The orbits are unremarkable. Other: None. IMPRESSION: No acute intracranial abnormality. Electronically Signed   By: Morgane  Naveau M.D.   On: 02/06/2024 21:08     Procedures   Medications Ordered in the ED  ketorolac  (TORADOL ) 15 MG/ML injection 15 mg (has no administration in time range)  metoCLOPramide  (REGLAN ) injection 10 mg (10 mg Intravenous Given 02/06/24 2055)  diphenhydrAMINE  (BENADRYL ) injection 25 mg (25 mg Intravenous Given 02/06/24 2055)  sodium chloride  0.9 % bolus 1,000 mL (0 mLs Intravenous Stopped 02/06/24 2229)                                     Medical Decision Making This patient presents to the ED for concern of headache and HTN, this involves an extensive number of treatment options, and is a complaint that carries with it a high risk of complications and morbidity.  The differential diagnosis includes hypertensive emergency, hypertensive urgency, migraine, tension headache, cluster headache.   Co morbidities that complicate the patient evaluation  Hypertension   Additional history obtained:  Additional history obtained from record review External records from outside source obtained and reviewed including recent rheumatology note   Lab Tests:  I Ordered, and personally interpreted labs.  The pertinent results include: CBC largely stable as compared to values from 5 days ago.  CMP notable for minimal hypokalemia with potassium of 3.2, ALT elevated at 53 however this is reflective of patient's baseline as compared to previous labs.  Serum hCG negative.   Imaging Studies ordered:  I ordered imaging studies including CT head  I independently visualized and interpreted imaging which showed No acute intracranial abnormality.  I agree with the radiologist interpretation   Cardiac Monitoring: / EKG:  The patient was maintained on a cardiac monitor.  I personally viewed and interpreted the cardiac monitored which showed an underlying rhythm of: NSR    Problem List / ED Course / Critical interventions / Medication management  IV fluid bolus I ordered medication including Benadryl /Reglan /Toradol  for headache Reevaluation of the patient after these medicines showed that the patient improved I have reviewed the patients home medicines and have made adjustments as needed   Social Determinants of Health:  Former tobacco use, financial instability   Test / Admission - Considered:  Patient complains of blurry vision left > right, visual acuity checked by nursing staff and was found to be 20/50  bilaterally.  I feel that this is likely reflective of patient's baseline, she reports that she does not wear glasses/contacts, I recommended optometry follow-up.  At time of reassessment following medications/IV fluids, patient reports improvement in her headache symptoms, no nausea/vomiting, photophobia symptoms have resolved and she  is A&O x4; I suspect that these symptoms were likely secondary to migraine.  BP was elevated throughout the duration of patient's stay in the emergency department this evening, I suspect this is likely secondary to pain as her blood pressure did improve as her headache symptoms alleviated.  Vitals are reassuring otherwise, patient is afebrile.  Labs are reassuring, there is no evidence of endorgan damage that would make me suspicious of hypertensive emergency.  Patient is scheduled to follow-up with her primary care provider on Wednesday, I advised patient to discuss her blood pressure medications with her primary care provider at that time to continue her medications as directed until then.  I encouraged her to continue ibuprofen /Tylenol  as needed if headache symptoms return, she voiced understanding and is in agreement with this plan.  Strict return precautions discussed, she is appropriate discharge at this time.    Amount and/or Complexity of Data Reviewed Labs: ordered. Radiology: ordered.  Risk Prescription drug management.        Final diagnoses:  Nonintractable headache, unspecified chronicity pattern, unspecified headache type  Hypertension, unspecified type    ED Discharge Orders     None          Glendia Rocky LOISE DEVONNA 02/06/24 2234    Patsey Lot, MD 02/07/24 1445

## 2024-02-07 ENCOUNTER — Other Ambulatory Visit: Payer: Self-pay

## 2024-02-07 ENCOUNTER — Other Ambulatory Visit (HOSPITAL_COMMUNITY): Payer: Self-pay

## 2024-02-07 NOTE — Progress Notes (Signed)
 Specialty Pharmacy Refill Coordination Note  Spoke with Makayli Latrece Cadmus  Lessa Latrece Handley is a 33 y.o. female contacted today regarding refills of specialty medication(s) Upadacitinib  (Rinvoq )  Patient requested: Delivery   Delivery date: 02/09/24   Verified address: [address]  [place] [address]  Medication will be filled on 02/08/24.

## 2024-02-09 ENCOUNTER — Other Ambulatory Visit: Payer: Self-pay

## 2024-02-09 ENCOUNTER — Other Ambulatory Visit (HOSPITAL_COMMUNITY): Payer: Self-pay

## 2024-02-09 ENCOUNTER — Telehealth (HOSPITAL_COMMUNITY): Payer: Self-pay

## 2024-02-09 MED ORDER — OZEMPIC (0.25 OR 0.5 MG/DOSE) 2 MG/3ML ~~LOC~~ SOPN
PEN_INJECTOR | SUBCUTANEOUS | 2 refills | Status: DC
  Filled 2024-02-09: qty 3, 28d supply, fill #0

## 2024-02-09 MED ORDER — TRAZODONE HCL 150 MG PO TABS
150.0000 mg | ORAL_TABLET | Freq: Every day | ORAL | 5 refills | Status: DC
  Filled 2024-02-09: qty 30, 30d supply, fill #0
  Filled 2024-03-13: qty 30, 30d supply, fill #1

## 2024-02-09 NOTE — Telephone Encounter (Signed)
 Ozempic /Mounjaro  is approved exclusively as an adjunct to diet and exercise to improve glycemic control in adults with type 2 diabetes mellitus. A review of patient's medical chart reveals no documented diagnosis of type 2 diabetes or an A1C indicative of diabetes. Therefore, they do not currently meet the criteria for prior authorization of this medication. If clinically appropriate, alternative options such as Saxenda , Zepbound , or Wegovy  may be considered for this patient.  *****If the A1C from today comes back over 6.5 and there is a new Type 2 diabetes diagnosis, we can run the prior authorization then.  A user error has taken place: encounter opened in error, closed for administrative reasons.

## 2024-02-09 NOTE — Telephone Encounter (Signed)
//

## 2024-02-16 ENCOUNTER — Other Ambulatory Visit (HOSPITAL_COMMUNITY): Payer: Self-pay

## 2024-02-16 ENCOUNTER — Other Ambulatory Visit: Payer: Self-pay

## 2024-02-16 ENCOUNTER — Encounter: Payer: Self-pay | Admitting: Hematology

## 2024-02-16 MED ORDER — QUETIAPINE FUMARATE 100 MG PO TABS
100.0000 mg | ORAL_TABLET | Freq: Every day | ORAL | 2 refills | Status: DC
  Filled 2024-02-16: qty 30, 30d supply, fill #0
  Filled 2024-03-13: qty 30, 30d supply, fill #1

## 2024-02-16 MED ORDER — OZEMPIC (0.25 OR 0.5 MG/DOSE) 2 MG/3ML ~~LOC~~ SOPN
PEN_INJECTOR | SUBCUTANEOUS | 2 refills | Status: DC
  Filled 2024-02-16 – 2024-02-18 (×2): qty 3, 42d supply, fill #0
  Filled 2024-02-18: qty 3, 28d supply, fill #0
  Filled 2024-02-18: qty 3, 42d supply, fill #0

## 2024-02-18 ENCOUNTER — Other Ambulatory Visit (HOSPITAL_BASED_OUTPATIENT_CLINIC_OR_DEPARTMENT_OTHER): Payer: Self-pay

## 2024-02-18 ENCOUNTER — Other Ambulatory Visit (HOSPITAL_COMMUNITY): Payer: Self-pay

## 2024-02-21 ENCOUNTER — Other Ambulatory Visit (HOSPITAL_COMMUNITY): Payer: Self-pay

## 2024-02-22 ENCOUNTER — Other Ambulatory Visit: Payer: Self-pay

## 2024-02-23 ENCOUNTER — Other Ambulatory Visit: Payer: Self-pay

## 2024-02-23 ENCOUNTER — Other Ambulatory Visit (HOSPITAL_COMMUNITY): Payer: Self-pay

## 2024-02-23 MED ORDER — LOSARTAN POTASSIUM 100 MG PO TABS
100.0000 mg | ORAL_TABLET | Freq: Every day | ORAL | 1 refills | Status: AC
  Filled 2024-02-23: qty 90, 90d supply, fill #0

## 2024-02-25 ENCOUNTER — Other Ambulatory Visit: Payer: Self-pay

## 2024-02-25 ENCOUNTER — Other Ambulatory Visit (HOSPITAL_COMMUNITY): Payer: Self-pay

## 2024-02-26 MED ORDER — GABAPENTIN 300 MG PO CAPS
300.0000 mg | ORAL_CAPSULE | Freq: Two times a day (BID) | ORAL | 5 refills | Status: AC
  Filled 2024-02-26: qty 60, 30d supply, fill #0
  Filled 2024-03-27: qty 60, 30d supply, fill #1
  Filled 2024-05-02: qty 60, 30d supply, fill #2
  Filled 2024-05-28: qty 60, 30d supply, fill #3
  Filled 2024-07-01: qty 60, 30d supply, fill #4
  Filled 2024-08-02: qty 60, 30d supply, fill #5

## 2024-02-28 ENCOUNTER — Other Ambulatory Visit (HOSPITAL_COMMUNITY): Payer: Self-pay

## 2024-03-13 ENCOUNTER — Other Ambulatory Visit (HOSPITAL_BASED_OUTPATIENT_CLINIC_OR_DEPARTMENT_OTHER): Payer: Self-pay

## 2024-03-13 ENCOUNTER — Other Ambulatory Visit: Payer: Self-pay | Admitting: Physician Assistant

## 2024-03-13 ENCOUNTER — Other Ambulatory Visit: Payer: Self-pay

## 2024-03-13 MED ORDER — PANTOPRAZOLE SODIUM 40 MG PO TBEC
40.0000 mg | DELAYED_RELEASE_TABLET | Freq: Two times a day (BID) | ORAL | 1 refills | Status: DC
  Filled 2024-03-13: qty 60, 30d supply, fill #0
  Filled 2024-04-17: qty 60, 30d supply, fill #1

## 2024-03-17 ENCOUNTER — Other Ambulatory Visit: Payer: Self-pay

## 2024-03-17 ENCOUNTER — Other Ambulatory Visit (HOSPITAL_COMMUNITY): Payer: Self-pay

## 2024-03-17 MED ORDER — QUETIAPINE FUMARATE 200 MG PO TABS
200.0000 mg | ORAL_TABLET | Freq: Every day | ORAL | 2 refills | Status: DC
  Filled 2024-03-17: qty 30, 30d supply, fill #0
  Filled 2024-04-17: qty 30, 30d supply, fill #1
  Filled 2024-05-28: qty 30, 30d supply, fill #2

## 2024-03-17 MED ORDER — DULOXETINE HCL 30 MG PO CPEP
30.0000 mg | ORAL_CAPSULE | Freq: Two times a day (BID) | ORAL | 2 refills | Status: DC
  Filled 2024-03-17: qty 60, 30d supply, fill #0
  Filled 2024-04-17: qty 60, 30d supply, fill #1

## 2024-03-17 MED ORDER — OZEMPIC (1 MG/DOSE) 4 MG/3ML ~~LOC~~ SOPN
1.0000 mg | PEN_INJECTOR | SUBCUTANEOUS | 2 refills | Status: DC
  Filled 2024-03-17 – 2024-03-20 (×2): qty 3, 28d supply, fill #0

## 2024-03-18 ENCOUNTER — Other Ambulatory Visit (HOSPITAL_COMMUNITY): Payer: Self-pay

## 2024-03-20 ENCOUNTER — Other Ambulatory Visit: Payer: Self-pay

## 2024-03-20 ENCOUNTER — Other Ambulatory Visit (HOSPITAL_COMMUNITY): Payer: Self-pay

## 2024-03-20 MED ORDER — CETIRIZINE HCL 10 MG PO TABS
10.0000 mg | ORAL_TABLET | ORAL | 5 refills | Status: AC | PRN
  Filled 2024-03-20 (×2): qty 30, 30d supply, fill #0
  Filled 2024-08-29: qty 30, 30d supply, fill #1

## 2024-03-21 ENCOUNTER — Other Ambulatory Visit: Payer: Self-pay

## 2024-03-22 ENCOUNTER — Ambulatory Visit (HOSPITAL_COMMUNITY)
Admission: RE | Admit: 2024-03-22 | Discharge: 2024-03-22 | Disposition: A | Discharge status: Home / Self Care | Source: Ambulatory Visit | Attending: Hematology | Admitting: Hematology

## 2024-03-22 ENCOUNTER — Inpatient Hospital Stay: Attending: Hematology

## 2024-03-22 DIAGNOSIS — C210 Malignant neoplasm of anus, unspecified: Secondary | ICD-10-CM | POA: Diagnosis present

## 2024-03-22 DIAGNOSIS — Z85048 Personal history of other malignant neoplasm of rectum, rectosigmoid junction, and anus: Secondary | ICD-10-CM | POA: Diagnosis present

## 2024-03-22 DIAGNOSIS — Z79899 Other long term (current) drug therapy: Secondary | ICD-10-CM | POA: Diagnosis not present

## 2024-03-22 DIAGNOSIS — N911 Secondary amenorrhea: Secondary | ICD-10-CM | POA: Diagnosis not present

## 2024-03-22 DIAGNOSIS — Z7985 Long-term (current) use of injectable non-insulin antidiabetic drugs: Secondary | ICD-10-CM | POA: Diagnosis not present

## 2024-03-22 DIAGNOSIS — Z79624 Long term (current) use of inhibitors of nucleotide synthesis: Secondary | ICD-10-CM | POA: Insufficient documentation

## 2024-03-22 DIAGNOSIS — R635 Abnormal weight gain: Secondary | ICD-10-CM | POA: Insufficient documentation

## 2024-03-22 DIAGNOSIS — R197 Diarrhea, unspecified: Secondary | ICD-10-CM | POA: Insufficient documentation

## 2024-03-22 DIAGNOSIS — D649 Anemia, unspecified: Secondary | ICD-10-CM | POA: Diagnosis not present

## 2024-03-22 LAB — CMP (CANCER CENTER ONLY)
ALT: 31 U/L (ref 0–44)
AST: 17 U/L (ref 15–41)
Albumin: 4.1 g/dL (ref 3.5–5.0)
Alkaline Phosphatase: 90 U/L (ref 38–126)
Anion gap: 6 (ref 5–15)
BUN: 13 mg/dL (ref 6–20)
CO2: 27 mmol/L (ref 22–32)
Calcium: 9.1 mg/dL (ref 8.9–10.3)
Chloride: 107 mmol/L (ref 98–111)
Creatinine: 0.67 mg/dL (ref 0.44–1.00)
GFR, Estimated: >60 mL/min (ref >60)
Glucose, Bld: 97 mg/dL (ref 70–99)
Potassium: 3.9 mmol/L (ref 3.5–5.1)
Sodium: 140 mmol/L (ref 135–145)
Total Bilirubin: 0.3 mg/dL (ref 0.0–1.2)
Total Protein: 7 g/dL (ref 6.5–8.1)

## 2024-03-22 LAB — CBC WITH DIFFERENTIAL (CANCER CENTER ONLY)
Abs Immature Granulocytes: 0.01 K/uL (ref 0.00–0.07)
Basophils Absolute: 0 K/uL (ref 0.0–0.1)
Basophils Relative: 0 %
Eosinophils Absolute: 0.1 K/uL (ref 0.0–0.5)
Eosinophils Relative: 1 %
HCT: 35.1 % — ABNORMAL LOW (ref 36.0–46.0)
Hemoglobin: 11.7 g/dL — ABNORMAL LOW (ref 12.0–15.0)
Immature Granulocytes: 0 %
Lymphocytes Relative: 32 %
Lymphs Abs: 1.9 K/uL (ref 0.7–4.0)
MCH: 30.2 pg (ref 26.0–34.0)
MCHC: 33.3 g/dL (ref 30.0–36.0)
MCV: 90.7 fL (ref 80.0–100.0)
Monocytes Absolute: 0.4 K/uL (ref 0.1–1.0)
Monocytes Relative: 8 %
Neutro Abs: 3.5 K/uL (ref 1.7–7.7)
Neutrophils Relative %: 59 %
Platelet Count: 235 K/uL (ref 150–400)
RBC: 3.87 MIL/uL (ref 3.87–5.11)
RDW: 13.1 % (ref 11.5–15.5)
WBC Count: 5.9 K/uL (ref 4.0–10.5)
nRBC: 0 % (ref 0.0–0.2)

## 2024-03-22 MED ORDER — IOHEXOL 300 MG/ML  SOLN
100.0000 mL | Freq: Once | INTRAMUSCULAR | Status: AC | PRN
  Administered 2024-03-22: 100 mL via INTRAVENOUS

## 2024-03-27 ENCOUNTER — Other Ambulatory Visit (HOSPITAL_COMMUNITY): Payer: Self-pay

## 2024-03-27 ENCOUNTER — Encounter: Payer: Self-pay | Admitting: Hematology

## 2024-03-29 ENCOUNTER — Other Ambulatory Visit: Payer: Self-pay

## 2024-03-29 ENCOUNTER — Inpatient Hospital Stay (HOSPITAL_BASED_OUTPATIENT_CLINIC_OR_DEPARTMENT_OTHER): Admitting: Hematology

## 2024-03-29 VITALS — BP 148/90 | HR 110 | Temp 97.7°F | Resp 18 | Ht 72.0 in | Wt 301.9 lb

## 2024-03-29 DIAGNOSIS — C210 Malignant neoplasm of anus, unspecified: Secondary | ICD-10-CM | POA: Diagnosis not present

## 2024-03-29 DIAGNOSIS — Z85048 Personal history of other malignant neoplasm of rectum, rectosigmoid junction, and anus: Secondary | ICD-10-CM | POA: Diagnosis not present

## 2024-03-29 NOTE — Assessment & Plan Note (Signed)
 cT2N0Mx, with hypermetabolic left axillary nodes, HPV(+) -presented with worsening rectal bleeding, began passing clots and was admitted 02/06/22 for anemia from blood loss. S/p hemorrhoidectomy on 02/08/22 by Dr. 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 -PET scan 02/26/22 showed: intense uptake to anus; no signs of solid organ or FDG-avid nodal metastasis within abdomen or pelvis, hypermetabolic left axillary lymph nodes.  Sequently left axillary lymph node biopsy was benign. -she established care with Dr. Cliffton Asters on 03/03/22. -s/p concurrent chemoRT with mitomycin/5FU 03/09/22 - 04/20/22, tolerated moderately well with significant rectal pain. -Restaging PET scan on July 08, 2022 showed persistent increased radiotracer uptake associated with the anus, suspicious for residual disease.  She also had persistent significant anal pain.  She underwent exam and a biopsy by Dr. Cliffton Asters in OR on July 21, 2022, which was negative for residual disease. -Her rectal pain has resolved, no clinical concern for residual cancer -I reviewed her separating CT scan from January 07, 2023, which showed new enlarged right and left axillary and subpectoral lymph nodes, and numerous mildly enlarged retroperitoneal and right iliac lymph nodes, just stable from last CT scan.  She does have rheumatoid arthritis with diffuse joint pain, her adenopathy are certainly possible reactive, however given her history of anal cancer, I will obtain a PET scan for further evaluation, to see if any need biopsy one of her lymph nodes. -PET 02/05/23 showed persistent increased radiotracer uptake associated with the anus compatible with known anal cancer. Hypermetabolic bilateral axillary, bilateral subpectoral, right supraclavicular, retroperitoneal and right iliac side chain lymph nodes concerning for nodal metastatic disease. -I recommend IR node biopsy,  she agrees  -she followed with Dr. Cliffton Asters and had anal scopy exam under anesthesia which was negative for residual cancer

## 2024-03-29 NOTE — Progress Notes (Signed)
 [facility]   Telephone:([phone]) [phone] Fax:([phone]   Clinic Follow up Note   Patient Care Team: Pa, [facility] as PCP - General (Internal Medicine) Leni Marjory MATSU, MD (Rheumatology) Marne Kelly Nest, MD as Consulting Physician (Obstetrics and Gynecology) Lanny Callander, MD as Consulting Physician (Hematology and Oncology)  Date of Service:  03/29/2024  CHIEF COMPLAINT: f/u of anal cancer   CURRENT THERAPY:  Cancer surveillance  Oncology History   Anal squamous cell carcinoma (HCC) cT2N0Mx, with hypermetabolic left axillary nodes, HPV(+) -presented with worsening rectal bleeding, began passing clots and was admitted 02/06/22 for anemia from blood loss. S/p hemorrhoidectomy on 02/08/22 by Dr. Sebastian, pathology of a 4.2 cm invasive squamous cell carcinoma, with positive margin at excision.  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 -PET scan 02/26/22 showed: intense uptake to anus; no signs of solid organ or FDG-avid nodal metastasis within abdomen or pelvis, hypermetabolic left axillary lymph nodes.  Sequently left axillary lymph node biopsy was benign. -s/p concurrent chemoRT with mitomycin /5FU 03/09/22 - 04/20/22, tolerated moderately well with significant rectal pain. -Restaging PET scan on July 08, 2022 showed persistent increased radiotracer uptake associated with the anus, suspicious for residual disease.  She also had persistent significant anal pain.  She underwent exam and a biopsy by Dr. Teresa in OR on July 21, 2022, which was negative for  -I reviewed her separating CT scan from January 07, 2023, which showed new enlarged right and left axillary and subpectoral lymph nodes, and numerous mildly enlarged retroperitoneal and right iliac lymph nodes, just stable from last CT scan.  She does have rheumatoid arthritis with diffuse joint pain, her adenopathy are certainly possible reactive, however given her history of anal cancer, I will obtain a PET scan for  further evaluation, to see if any need biopsy one of her lymph nodes. -PET 02/05/23 showed persistent increased radiotracer uptake associated with the anus compatible with known anal cancer. Hypermetabolic bilateral axillary, bilateral subpectoral, right supraclavicular, retroperitoneal and right iliac side chain lymph nodes concerning for nodal metastatic disease. -I recommend IR node biopsy, she agreed but did not proceed  -she followed with Dr. Teresa and had anal scopy exam under anesthesia in 05/2023 which was negative for residual cancer   Assessment & Plan Anal cancer, on surveillance Anal cancer is in remission with no signs of recurrence on the latest CT scan. Previous enlarged lymph nodes appear improved and are likely reactive and benign. Diagnosed in 2023, it has been two years since diagnosis, which is a positive indicator as the first three years are the most critical for recurrence. - Send a message to Doctor White to schedule a follow-up appointment within the next month. - Order a follow-up scan in one year. - Schedule follow-up appointments every six months with the oncology team. - Ensure follow-up with Doctor Teresa every three to four months for this year.  Diarrhea Experiencing diarrhea three to four times a day with loose stools. No rectal discomfort reported. Diarrhea may lead to dehydration and low potassium levels. - Advise taking Imodium if experiencing more than three episodes of diarrhea per day. - Encourage drinking electrolyzed water to prevent dehydration.  Abnormal weight gain Weight gain noted. Currently on Ozempic  for weight management, which should aid in weight loss.  Amenorrhea secondary to radiation therapy Amenorrhea since radiation therapy, no menstrual periods reported.  Mild anemia Mild anemia noted in recent blood work. Kidney and liver functions are normal.  Plan - She is overall  doing well - Lab and CT scan reviewed, no evidence of cancer  recurrence - I encouraged her to follow-up with Dr. Teresa for endoscopic exam, will message Dr. Teresa ulcer - Lab and follow-up in 6 months, plan to repeat a CT scan in 12 months   SUMMARY OF ONCOLOGIC HISTORY: Oncology History Overview Note   Cancer Staging  Anal squamous cell carcinoma (HCC) Staging form: Anus, AJCC V9 - Clinical stage from 02/08/2022: Stage IIA (cT2, cN0, cM0) - Signed by Lanny Callander, MD on 02/23/2022 Stage prefix: Initial diagnosis     Anal squamous cell carcinoma (HCC)  02/08/2022 Pathology Results   FINAL MICROSCOPIC DIAGNOSIS:   A. SOFT TISSUE, ANTERIOR MASS VS. HEMORRHOID, EXCISION:  Invasive squamous cell carcinoma, focally keratinizing and  well-differentiated.  Carcinoma is present at the margins of excision.   B. HEMORRHOID, INTERNAL, EXCISION:  Hemorrhoid without the overlying epithelium.  Separate fragment of benign squamous epithelium.  Negative for neoplasm.   ADDENDUM:  P16 immunostain is strongly positive suggestive of this lesion being the HPV related.    02/08/2022 Cancer Staging   Staging form: Anus, AJCC V9 - Clinical stage from 02/08/2022: Stage IIA (cT2, cN0, cM0) - Signed by Lanny Callander, MD on 02/23/2022 Stage prefix: Initial diagnosis   02/23/2022 Initial Diagnosis   Anal cancer (HCC)   03/09/2022 - 03/13/2022 Chemotherapy   Patient is on Treatment Plan : ANUS Mitomycin  D1,28 / 5FU D1-4, 28-31 q32d     03/09/2022 -  Chemotherapy   Patient is on Treatment Plan : ANUS Mitomycin  D1,28 + 5FU D1-4, 28-31 q32d      PET scan   IMPRESSION: 1. There is persistent increased radiotracer uptake associated with the anus. The degree of uptake is similar to the previous exam. Cannot exclude residual FDG avid tumor. 2. No signs of solid organ or nodal metastasis within the chest, abdomen or pelvis. 3. Interval resolution of previous FDG avid left axillary lymph nodes. 4. Interval increase in radiotracer uptake associated with anterior mediastinal soft tissue.  The configuration of the soft tissue is consistent with the thymus gland. 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 5. Interval development of multiple FDG avid cutaneous soft tissue nodules within the anterior left shoulder, ventral chest wall, and anteromedial left thigh. These are nonspecific and may be inflammatory or infectious in etiology. Metastatic disease is considered less favored. Correlation with physical exam findings is advised. 6. Enlarged uterus containing degenerating fibroids.      Discussed the use of AI scribe software for clinical note transcription with the patient, who gave verbal consent to proceed.  History of Present Illness Tammy Garrison is a 33 year old female with anal cancer who presents for follow-up.  She experiences diarrhea three to four times daily with loose stools and has not recently used Imodium. There is no rectal discomfort. She has been on Ozempic  for weight management for about a month and a half. She has not had a menstrual period since undergoing radiation therapy. Her current medications include gabapentin  for muscle spasms.     All other systems were reviewed with the patient and are negative.  MEDICAL HISTORY:  Past Medical History:  Diagnosis Date   Anemia    Bipolar 1 disorder (HCC)    Cancer (HCC)    squamous cell rectal cancer   Diabetes (HCC)    PREDIABETIC   ETOH abuse    H/O self-harm  H/O suicide attempt    x 5 - last 05/2016 - overdose Ibuprofen    Marijuana abuse    Rheumatoid arthritis (HCC)     SURGICAL HISTORY: Past Surgical History:  Procedure Laterality Date   HEMORRHOID SURGERY N/A 02/08/2022   Procedure: EXTERNAL AND INTERNAL HEMORRHOIDECTOMY;  Surgeon: Sebastian Moles, MD;  Location: [facility] OR;  Service: General;  Laterality: N/A;   INCISION AND DRAINAGE ABSCESS Right 04/22/2022   Procedure: INCISION AND DRAINAGE axilla;  Surgeon:  Stevie, Herlene Righter, MD;  Location: [facility] ORS;  Service: General;  Laterality: Right;   RECTAL BIOPSY N/A 05/10/2023   Procedure: EXCISION OF LEFT PERIANAL NODULE;  Surgeon: Teresa Lonni HERO, MD;  Location: [facility] OR;  Service: General;  Laterality: N/A;   RECTAL EXAM UNDER ANESTHESIA N/A 07/21/2022   Procedure: RECTAL EXAM UNDER ANESTHESIA;  Surgeon: Teresa Lonni HERO, MD;  Location: [facility] OR;  Service: General;  Laterality: N/A;   RECTAL EXAM UNDER ANESTHESIA N/A 05/10/2023   Procedure: RECTAL EXAM UNDER ANESTHESIA;  Surgeon: Teresa Lonni HERO, MD;  Location: [facility] OR;  Service: General;  Laterality: N/A;  60   TRANSANAL HEMORRHOIDAL DEARTERIALIZATION N/A 07/21/2022   Procedure: ANAL CANAL BIOPSY;  Surgeon: Teresa Lonni HERO, MD;  Location: [facility] OR;  Service: General;  Laterality: N/A;    I have reviewed the social history and family history with the patient and they are unchanged from previous note.  ALLERGIES:  is allergic to coconut flavoring agent (non-screening) and flavoring agent.  MEDICATIONS:  Current Outpatient Medications  Medication Sig Dispense Refill   acetaminophen  (TYLENOL ) 325 MG tablet Take 2 tablets (650 mg total) by mouth every 6 (six) hours as needed for mild pain.     benzonatate  (TESSALON ) 100 MG capsule Take 1-2 capsules (100-200 mg total) by mouth every 8 (eight) hours as needed for cough and congestion. 30 capsule 2   cetirizine  (ZYRTEC ) 10 MG tablet Take 1 tablet (10 mg total) by mouth every evening as needed for allergies. 30 tablet 5   diclofenac  (VOLTAREN ) 75 MG EC tablet Take 1 tablet (75 mg total) by mouth 2 (two) times daily as needed with food . 60 tablet 2   dicyclomine  (BENTYL ) 20 MG tablet Take 1 tablet (20 mg total) by mouth 3 (three) times daily as needed for spasms. 50 tablet 0   doxycycline  (VIBRAMYCIN ) 100 MG capsule Take 1 capsule (100 mg total) by mouth 2 (two) times daily. 19 capsule 0   DULoxetine  (CYMBALTA ) 30 MG capsule Take 1 capsule (30 mg  total) by mouth 2 (two) times daily. 60 capsule 2   DULoxetine  (CYMBALTA ) 60 MG capsule Take 1 capsule (60 mg total) by mouth at bedtime. 90 capsule 0   famotidine  (PEPCID ) 40 MG tablet Take 1 tablet (40 mg total) by mouth 2 (two) times daily. 60 tablet 11   gabapentin  (NEURONTIN ) 300 MG capsule Take 1 capsule (300 mg total) by mouth 2 (two) times daily. 60 capsule 5   leflunomide  (ARAVA ) 20 MG tablet Take 1 tablet (20 mg total) by mouth at bedtime. 90 tablet 0   oxybutynin  (DITROPAN -XL) 10 MG 24 hr tablet Take 1 tablet (10 mg total) by mouth daily with dinner. 30 tablet 5   pantoprazole  (PROTONIX ) 40 MG tablet Take 1 tablet (40 mg total) by mouth 2 (two) times daily before a meal. 60 tablet 1   QUEtiapine  (SEROQUEL ) 200 MG tablet Take 1 tablet (200 mg total) by mouth at bedtime. 30 tablet 2   rosuvastatin  (CRESTOR ) 10  MG tablet Take 1 tablet (10 mg total) by mouth daily. 90 tablet 0   Semaglutide , 1 MG/DOSE, (OZEMPIC , 1 MG/DOSE,) 4 MG/3ML SOPN Inject 1 mg into the skin once a week. 1 mL 2   tiZANidine  (ZANAFLEX ) 4 MG tablet Take 1 tablet (4 mg total) by mouth 2 (two) times daily as needed. 60 tablet 2   Upadacitinib  ER (RINVOQ ) 15 MG TB24 Take 1 tablet (15 mg total) by mouth daily. 90 tablet 0   valACYclovir  (VALTREX ) 500 MG tablet Take 1 tablet (500 mg total) by mouth daily for prevention of outbreak 90 tablet 5   losartan  (COZAAR ) 100 MG tablet Take 1 tablet (100 mg total) by mouth daily. 90 tablet 1   Vitamin D , Ergocalciferol , (DRISDOL ) 1.25 MG (50000 UNIT) CAPS capsule Take 1 capsule (50,000 Units total) by mouth once a week. 4 capsule 5   No current facility-administered medications for this visit.   Facility-Administered Medications Ordered in Other Visits  Medication Dose Route Frequency Provider Last Rate Last Admin   0.9 %  sodium chloride  infusion (Manually program via Guardrails IV Fluids)  250 mL Intravenous Once Lanny Callander, MD       heparin  lock flush 100 unit/mL  250 Units  Intracatheter Once Lanny Callander, MD       sodium chloride  flush (NS) 0.9 % injection 10 mL  10 mL Intracatheter Once Lanny Callander, MD       sodium chloride  flush (NS) 0.9 % injection 10 mL  10 mL Intracatheter Once Lanny Callander, MD       sodium chloride  flush (NS) 0.9 % injection 10 mL  10 mL Intracatheter Once Lanny Callander, MD        PHYSICAL EXAMINATION: ECOG PERFORMANCE STATUS: 1 - Symptomatic but completely ambulatory  Vitals:   03/29/24 1018 03/29/24 1025  BP: (!) 156/92 (!) 148/90  Pulse: (!) 110   Resp: 18   Temp: 97.7 F (36.5 C)   SpO2: 97%    Wt Readings from Last 3 Encounters:  03/29/24 (!) 301 lb 14.4 oz (136.9 kg)  02/06/24 295 lb 6.7 oz (134 kg)  02/01/24 297 lb (134.7 kg)     GENERAL:alert, no distress and comfortable SKIN: skin color, texture, turgor are normal, no rashes or significant lesions EYES: normal, Conjunctiva are pink and non-injected, sclera clear Musculoskeletal:no cyanosis of digits and no clubbing  NEURO: alert & oriented x 3 with fluent speech, no focal motor/sensory deficits Patient declined a rectal exam  Physical Exam    LABORATORY DATA:  I have reviewed the data as listed    Latest Ref Rng & Units 03/22/2024    9:30 AM 02/06/2024    8:56 PM 02/01/2024   10:53 AM  CBC  WBC 4.0 - 10.5 K/uL 5.9  4.7  4.5   Hemoglobin 12.0 - 15.0 g/dL 88.2  88.4  88.4   Hematocrit 36.0 - 46.0 % 35.1  35.9  36.6   Platelets 150 - 400 K/uL 235  231  248         Latest Ref Rng & Units 03/22/2024    9:30 AM 02/06/2024    8:56 PM 02/01/2024   10:53 AM  CMP  Glucose 70 - 99 mg/dL 97  93  875   BUN 6 - 20 mg/dL 13  10  14    Creatinine 0.44 - 1.00 mg/dL 9.32  9.31  9.35   Sodium 135 - 145 mmol/L 140  139  140   Potassium 3.5 -  5.1 mmol/L 3.9  3.2  4.0   Chloride 98 - 111 mmol/L 107  105  106   CO2 22 - 32 mmol/L 27  25  26    Calcium  8.9 - 10.3 mg/dL 9.1  9.3  9.5   Total Protein 6.5 - 8.1 g/dL 7.0  7.2  6.7   Total Bilirubin 0.0 - 1.2 mg/dL 0.3  1.0  0.5   Alkaline  Phos 38 - 126 U/L 90  85    AST 15 - 41 U/L 17  32  33   ALT 0 - 44 U/L 31  53  53       RADIOGRAPHIC STUDIES: I have personally reviewed the radiological images as listed and agreed with the findings in the report. No results found.    No orders of the defined types were placed in this encounter.  All questions were answered. The patient knows to call the clinic with any problems, questions or concerns. No barriers to learning was detected. The total time spent in the appointment was 25 minutes, including review of chart and various tests results, discussions about plan of care and coordination of care plan     Onita Mattock, MD 03/29/2024

## 2024-03-30 ENCOUNTER — Other Ambulatory Visit: Payer: Self-pay

## 2024-03-30 ENCOUNTER — Other Ambulatory Visit (HOSPITAL_COMMUNITY): Payer: Self-pay

## 2024-03-30 MED ORDER — HYDROCHLOROTHIAZIDE 25 MG PO TABS
25.0000 mg | ORAL_TABLET | Freq: Every morning | ORAL | 0 refills | Status: DC
  Filled 2024-03-30: qty 30, 30d supply, fill #0

## 2024-04-17 ENCOUNTER — Other Ambulatory Visit: Payer: Self-pay | Admitting: Internal Medicine

## 2024-04-17 ENCOUNTER — Other Ambulatory Visit (HOSPITAL_COMMUNITY): Payer: Self-pay

## 2024-04-17 DIAGNOSIS — G894 Chronic pain syndrome: Secondary | ICD-10-CM

## 2024-04-17 DIAGNOSIS — M069 Rheumatoid arthritis, unspecified: Secondary | ICD-10-CM

## 2024-04-17 DIAGNOSIS — Z79899 Other long term (current) drug therapy: Secondary | ICD-10-CM

## 2024-04-18 ENCOUNTER — Other Ambulatory Visit: Payer: Self-pay

## 2024-04-18 ENCOUNTER — Other Ambulatory Visit (HOSPITAL_COMMUNITY): Payer: Self-pay

## 2024-04-18 MED ORDER — DULOXETINE HCL 60 MG PO CPEP
60.0000 mg | ORAL_CAPSULE | Freq: Every day | ORAL | 0 refills | Status: DC
  Filled 2024-04-18: qty 90, 90d supply, fill #0

## 2024-04-18 MED ORDER — RINVOQ 15 MG PO TB24
15.0000 mg | ORAL_TABLET | Freq: Every day | ORAL | 0 refills | Status: DC
  Filled 2024-04-18 – 2024-05-03 (×3): qty 30, 30d supply, fill #0

## 2024-04-18 MED ORDER — OZEMPIC (1 MG/DOSE) 4 MG/3ML ~~LOC~~ SOPN
1.0000 mg | PEN_INJECTOR | SUBCUTANEOUS | 0 refills | Status: AC
  Filled 2024-04-18: qty 3, 28d supply, fill #0

## 2024-04-18 MED ORDER — LEFLUNOMIDE 20 MG PO TABS
20.0000 mg | ORAL_TABLET | Freq: Every day | ORAL | 0 refills | Status: DC
  Filled 2024-04-18: qty 90, 90d supply, fill #0

## 2024-04-18 NOTE — Telephone Encounter (Signed)
 Last Fill: 02/01/2024  Labs: 03/22/2024 Hgb 11.7, Hct 35.1  TB Gold: 12/25/2022 Neg   Next Visit: 05/08/2024  Last Visit: 02/01/2024  IK:Myzlfjunpi arthritis involving multiple sites, unspecified whether rheumatoid factor present   Current Dose per office note 02/01/2024: Rinvoq  15 mg PO daily  leflunomide  20 mg PO daily.   Patient to update TB Gold at upcoming appointment on 05/08/2024  Okay to refill Rinvoq , Arava  and Cymbalta ?

## 2024-04-19 ENCOUNTER — Other Ambulatory Visit: Payer: Self-pay

## 2024-04-19 ENCOUNTER — Other Ambulatory Visit (HOSPITAL_COMMUNITY): Payer: Self-pay

## 2024-04-25 NOTE — Progress Notes (Signed)
 Office Visit Note  Patient: Tammy Garrison             Date of Birth: 1990-08-17           MRN: [id number]             PCP: [facility] Referring: [facility] Visit Date: 05/08/2024   Subjective:  Pain and Joint Swelling (Left side of the body)   Discussed the use of AI scribe software for clinical note transcription with the patient, who gave verbal consent to proceed.  History of Present Illness   Tammy Garrison is a 33 y.o. female here for follow up for seropositive RA on Leflunomide  20 mg daily and  Rinvoq  15 mg PO daily.    She experiences significant pain primarily on the left side, affecting her hand and fingers, with the left hand being particularly problematic. The pain is sharp and throbbing, sometimes shooting up her fingers. Swelling in the left hand makes it difficult to perform daily tasks such as picking up a washcloth, and she has become reliant on her right hand for most activities.  She also reports issues with her left leg, particularly swelling in the foot after walking for extended periods. The swelling and pain are predominantly on the left side, which she describes as 'giving me a run for my money'.  She has been off prednisone  for about a week and has noticed an increase in pain since discontinuing it, although she was still experiencing pain while on the medication. She continues to take other prescribed medications, including rinvoq , leflunomide , Cymbalta  (duloxetine ), diclofenac , and gabapentin .  No recent illness or unusual activities. She feels very tired. She has difficulty with tasks such as opening doors, which feels like a strain, and notes that the pain persists. She has not had any prior nerve testing.  She is concerned about her ability to maintain employment due to her current limitations, as she is nearly one-handed and struggles with tasks requiring both hands.       Previous HPI 02/01/2024 Tammy Latrece  Garrison is a 33 y.o. female here for follow up for seropositive RA on Leflunomide  20 mg daily and  Rinvoq  15 mg PO daily. She presents with complaint of worsening joint pain and swelling especially on her left side.   Since discontinuing Actemra  and starting Rinvoq , she has experienced a significant exacerbation of her rheumatoid arthritis symptoms, describing her condition as 'back at square root one'. She endures daily pain and swelling, particularly in her joints, and describes a sensation of her 'joint is on fire'.   She is currently not taking leflunomide  and is uncertain about resuming it alongside Rinvoq . She is no longer on any prednisone  as well.   She reports swelling in her legs and ankles, with a recent incident of a loud pop in her ankle when getting up from the couch. She experiences numbness and tingling, particularly at the end of the day.   No recent illnesses have been noted, and her oncologist's recent evaluation was positive, with a follow-up CT scan scheduled for next month.       Previous HPI 11/04/2023 Tammy Garrison is a 33 y.o. female here for follow up for seropositive RA on Leflunomide  20 mg daily and actemra  162 mg Parker q14days started since December.    She experiences persistent joint pain and stiffness, primarily affecting her hands and feet. Actemra  has not provided significant relief, and she continues to experience weakness and  pain, particularly in her left hand. She describes difficulty with daily activities such as putting on socks, applying lotion, and sometimes showering due to the pain and weakness.   She has previously been treated with oral steroids for joint pain, which provided temporary relief. However, the pain returns within a day or two after stopping the steroids. She keeps a cane in her cart for days when her foot does not cooperate.   She reports frequent infections, including a recent sinus infection and COVID-19. After taking  antibiotics, she often becomes sick again within a few days. She recently underwent testing for flu and COVID-19, which returned negative results earlier this week.   No significant swelling in her joints, but persistent pain and weakness are present. No numbness or loss of feeling in her hands.         Previous HPI 09/14/2023 Tammy Garrison is a 33 y.o. female here for follow up for seropositive RA on Leflunomide  20 mg daily and actemra  162 mg Allgood q14days started since December.     She experiences persistent joint pain and swelling despite treatment with Actemra  injections. She continues to take leflunomide . There has been no significant improvement in her inflammation. Her pain is particularly severe in her foot, hand, and knee. Her knee is described as 'really swollen' with significant pain at night, especially if kept straight for long periods, making it difficult to bend. Her left wrist is also swollen, and she cannot wear socks for long periods due to swelling in her foot, which makes walking difficult.   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 Pain medications are ineffective, particularly at night, leading to sleeplessness. No issues have been noted at the injection sites.   She often gets sick after taking Actemra , including catching a virus shortly after her last dose, which left her unwell for a week. She has not required steroids for recent infections but has been treated with antibiotics. No rashes or issues at the injection sites. Swelling is noted in multiple joints, including her left wrist and knees, with the left knee being less affected than the right.     Previous HPI 07/12/2023 Tammy Garrison is a 33 y.o. female here for follow up for seropositive RA on Leflunomide  20 mg daily. She reports persistent pain, particularly in the hands, which has limited their ability to perform tasks  requiring manual dexterity. They also note improvement in their leg strength, as they are now able to ambulate without a cane. Despite this, they report persistent swelling in their left foot, which occasionally prevents them from wearing socks.   To manage the ongoing pain, their primary care physician prescribed tizanidine , which has helped with mobility but has not improved their hand function. The patient also reports numbness in their hands and pain in their wrists. They have been experiencing pain in their shoulders when they sleep on their sides and have therefore been trying to sleep on their back.   The patient also reports swelling and pain in their left foot, particularly when moving it side to side and up and down. They deny any pain in their toes.   In addition to these symptoms, the patient has been experiencing issues with their colon and is scheduled for a colonoscopy to investigate potential ulcers. They have been taking Tylenol  and nsaids, most recently oral diclofenac  for pain management, which they believe may have contributed to the potential  ulcers. They deny any history of diverticulitis or blood clots.     Previous HPI 05/12/2023 Tammy Garrison is a 33 y.o. female here for follow up for seropositive RA on Leflunomide  20 mg daily.  After last visit on the prednisone  taper she felt a good improvement in symptoms while taking 60 mg and then 40 mg of prednisone  but started to get more pain and swelling back at 20 mg and less.  So far not sure if the Cymbalta  and leflunomide  is making a big impact on her symptoms.  Still has pretty severe pain with any pressure and very limited in her activities.  Her friend is needing to help out up to an hour every day with basic daily activities.  She has not noticed a difference with starting the low-dose Cymbalta  but no particular side effect.  She went for exam for nodule near the anus with biopsy pathology report was consistent for  thrombosed hemorrhoid.   Previous HPI 04/07/2023 Tammy Garrison is a 33 y.o. female here for follow up for seropositive RA on Xeljanz  11 mg daily.  She developed an outbreak of painful vesicular rashes on gentle seen at the emergency department diagnosis for genital herpes outbreak with concern about this triggered by her Xeljanz  and steroid treatment.  She took oral antibiotics course and valacyclovir  with resolution of this outbreak.  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  When on the prednisone  taper at 60 and 40 mg had significant improvement. Since decreasing to 20 mg and stopping she has swelling and severe pain and stiffness again.  This interferes with her basic activities of daily living such as getting dressed and she is using a cane for support while walking.  Also currently feels some exacerbation of her anxiety and depressive symptoms off her mirtazapine  for the past month has not been to get back in with a primary care office yet for maintenance medication refills.   Previous HPI 02/24/2023 Tammy Garrison is a 33 y.o. female here for follow up for seropositive RA on Xeljanz  11 mg daily and prednisone  10 mg daily.  She saw partial benefit when on the 30 mg prednisone  but has really not been doing well at any point since her last visit.  Still with joint pain and swelling in multiple areas.  Has trouble standing up due to leg pains and cannot use her arms to push off easily due to her pain especially left elbow.  Over the interval she had follow-up scans with oncology including PET scan on July 5 this was concerning for hypermetabolic regional lymph nodes around the associated anal squamous cell cancer.  She was recommended for new scope and biopsy has not yet been established went over with him so far.   Currently symptoms completely prevent her from working and are limiting even for her ADLs requiring assistance to get back and forth to the bathroom and move around the house.   Previous HPI 12/25/22 Tammy Garrison is a 33 y.o. female here for evaluation and management of seropositive rheumatoid arthritis.  Originally diagnosed with onset of symptoms and 2019 and saw Dr. Leni for this.  She was initially treated with methotrexate which was ineffective subsequently treated with Xeljanz  with a good improvement.  She discontinued treatment in 2021 due to cost being prohibitive also was in  disease remission at the time.  She continued to do well off of any maintenance therapy until last year after diagnosis with anal carcinoma then treated with concurrent chemoradiation therapy and redeveloped significant joint inflammation in multiple areas.  Her treatments and symptoms that put her out of work most of the past year.  The past few months joint inflammation is getting worse has been seen at the hospital multiple times and treated with prolonged steroid tapers twice in February and April.  She see symptom relief when started on the high-dose steroids typically with some starting to return near the end of the taper and then gets a severe flare again within a few days of discontinuing medicine.  Currently she is off any steroids for 4 days and has severe joint pain and swelling in multiple areas. She also has hidradenitis suppurativa with involvement of the axillary, intertriginous, and groin areas.  No current open cysts or drainage.  Does notice some hardened mildly tender area along the left lower breast.  She was never on prolonged antibiotics and does not see dermatology for this condition.  HS symptoms were also quiescent during the few years off treatment before her cancer last year. No history of frequent respiratory or urinary tract infections.  She has had problems with bleeding and blood clots  associated with the anal cancer but not prior to this.  No shortness of breath or persistent cough.   DMARD Hx Xeljanz  - stopped 2021 due to cost and disease remission MTX - nonresponder   Review of Systems  Constitutional:  Positive for fatigue.  HENT:  Positive for mouth dryness. Negative for mouth sores.   Eyes:  Negative for dryness.  Respiratory:  Positive for shortness of breath.   Cardiovascular:  Positive for chest pain. Negative for palpitations.  Gastrointestinal:  Positive for diarrhea. Negative for blood in stool and constipation.  Endocrine: Positive for increased urination.  Genitourinary:  Positive for involuntary urination.  Musculoskeletal:  Positive for joint pain, gait problem, joint pain, joint swelling, myalgias, muscle weakness, morning stiffness, muscle tenderness and myalgias.  Skin:  Positive for rash and sensitivity to sunlight. Negative for color change and hair loss.  Allergic/Immunologic: Positive for susceptible to infections.  Neurological:  Positive for headaches. Negative for dizziness.  Hematological:  Positive for swollen glands.  Psychiatric/Behavioral:  Positive for depressed mood and sleep disturbance. The patient is nervous/anxious.     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   Bipolar 1 disorder (HCC) 02/23/2022   RA (rheumatoid arthritis) (HCC) 02/07/2022   Syncope 02/06/2022    ABLA (acute blood loss anemia) 02/06/2022   BRBPR (bright red blood per rectum) 02/06/2022   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   Depression 05/20/2016   Marijuana dependence (HCC) 05/19/2016    Past Medical History:  Diagnosis Date   Anemia    Bipolar 1 disorder (HCC)    Cancer (HCC)  squamous cell rectal cancer   Diabetes (HCC)    PREDIABETIC   ETOH abuse    H/O self-harm    H/O suicide attempt    x 5 - last 05/2016 - overdose Ibuprofen    Marijuana abuse    Rheumatoid arthritis (HCC)     Family History  Problem Relation Age of Onset   Lupus Mother    Diabetes Father    Hypertension Other    Colon cancer Neg Hx    Colon polyps Neg Hx    Stomach cancer Neg Hx    Past Surgical History:  Procedure Laterality Date   HEMORRHOID SURGERY N/A 02/08/2022   Procedure: EXTERNAL AND INTERNAL HEMORRHOIDECTOMY;  Surgeon: Sebastian Moles, MD;  Location: [facility] OR;  Service: General;  Laterality: N/A;   INCISION AND DRAINAGE ABSCESS Right 04/22/2022   Procedure: INCISION AND DRAINAGE axilla;  Surgeon: Stevie, Herlene Righter, MD;  Location: [facility] ORS;  Service: General;  Laterality: Right;   RECTAL BIOPSY N/A 05/10/2023   Procedure: EXCISION OF LEFT PERIANAL NODULE;  Surgeon: Teresa Lonni HERO, MD;  Location: [facility] OR;  Service: General;  Laterality: N/A;   RECTAL EXAM UNDER ANESTHESIA N/A 07/21/2022   Procedure: RECTAL EXAM UNDER ANESTHESIA;  Surgeon: Teresa Lonni HERO, MD;  Location: [facility] OR;  Service: General;  Laterality: N/A;   RECTAL EXAM UNDER ANESTHESIA N/A 05/10/2023   Procedure: RECTAL EXAM UNDER ANESTHESIA;  Surgeon: Teresa Lonni HERO, MD;  Location: [facility] OR;  Service: General;  Laterality: N/A;  60   TRANSANAL HEMORRHOIDAL DEARTERIALIZATION N/A 07/21/2022   Procedure: ANAL CANAL BIOPSY;  Surgeon: Teresa Lonni HERO, MD;  Location: [facility] OR;  Service: General;  Laterality: N/A;   Social History   Social History Narrative   Not on file    There  is no immunization history on file for this patient.   Objective: Vital Signs: BP (!) 156/101   Pulse 85   Temp (!) 96.7 F (35.9 C)   Resp 16   Ht 6' (1.829 m)   Wt (!) 303 lb 3.2 oz (137.5 kg)   BMI 41.12 kg/m    Physical Exam Eyes:     Conjunctiva/sclera: Conjunctivae normal.  Cardiovascular:     Rate and Rhythm: Normal rate and regular rhythm.  Pulmonary:     Effort: Pulmonary effort is normal.     Breath sounds: Normal breath sounds.  Lymphadenopathy:     Cervical: No cervical adenopathy.  Skin:    General: Skin is warm and dry.  Neurological:     Mental Status: She is alert.  Psychiatric:        Mood and Affect: Mood normal.      Musculoskeletal Exam:  Left shoulder pain on movement Left elbow tenderness with no swelling Decreased right wrist ROM, tenderness to pressure without radiation, left wrist tenderness without any swelling, radiating pain to fingers with percussion on flexor side Fingers full ROM, tenderness across MCP joints without palpable synovitis, Left hand MCP joints 1-3 tenderness, no palpable synovitis Knees full ROM, left knee pain with pressure and movement, no effusion Left ankle swelling and warmth, tenderness to pressure and with movement which is restricted Left 1st MTP swelling and tenderness  Investigation: No additional findings.  Imaging: No results found.  Recent Labs: Lab Results  Component Value Date   WBC 8.3 05/08/2024   HGB 11.6 (L) 05/08/2024   PLT 249 05/08/2024   NA 137 05/08/2024   K 3.7 05/08/2024   CL 101 05/08/2024   CO2  27 05/08/2024   GLUCOSE 109 (H) 05/08/2024   BUN 11 05/08/2024   CREATININE 0.73 05/08/2024   BILITOT 0.6 05/08/2024   ALKPHOS 90 03/22/2024   AST 45 (H) 05/08/2024   ALT 85 (H) 05/08/2024   PROT 7.4 05/08/2024   ALBUMIN 4.1 03/22/2024   CALCIUM  10.1 05/08/2024   GFRAA >60 04/12/2018   QFTBGOLDPLUS NEGATIVE 05/08/2024    Speciality Comments: No specialty comments  available.  Procedures:  No procedures performed Allergies: Coconut flavoring agent (non-screening) and Flavoring agent   Assessment / Plan:     Visit Diagnoses: Rheumatoid arthritis involving multiple sites, unspecified whether rheumatoid factor present (HCC) - Plan: Sedimentation rate, predniSONE  (DELTASONE ) 5 MG tablet, predniSONE  (DELTASONE ) 5 MG tablet Symptoms worsened but also short term has been off prednisone . Current regimen not controlling symptoms well. Numbness, tingling noted. Chronic left hand and wrist pain with suspected nerve compression at the wrist, common in rheumatoid arthritis due to inflammation. - Checking sed rate for disease activity monitoring - Continue leflunomide  20 mg PO daily. - Resume prednisone  5 mg daily - Continue Rinvoq  15 mg PO daily - Repeat prednisone  taper for acute exacerbation  High risk medication use - Initiate Rinvoq  15 mg PO daily, leflunomide  20 mg daily for now - Plan: CBC with Differential/Platelet, Comprehensive metabolic panel with GFR, QuantiFERON-TB Gold Plus Tolerating rinvoq  and leflunomide  without incident after starting. No interval serious infections. Updating labs. - Checking CBC and CMP for medication monitoring  Anal squamous cell carcinoma (HCC)  Chronic pain syndrome - Cymbalta  60 mg daily Left wrist pain, suspected carpal tunnel syndrome. Limited MSKUS exam not definitive for increased nerve CSA. - Referral for NCS - Discuss wrist injections or surgical decompression if nerve impingement is confirmed.  Chronic pain syndrome Chronic pain persists despite medication adherence, impacting daily activities and work.On leflunomide , and gabapentin .     Orders: Orders Placed This Encounter  Procedures   Sedimentation rate   CBC with Differential/Platelet   Comprehensive metabolic panel with GFR   QuantiFERON-TB Gold Plus   Meds ordered this encounter  Medications   predniSONE  (DELTASONE ) 5 MG tablet    Sig: Take 4  tablets (20 mg total) by mouth daily with breakfast for 3 days, THEN 3 tablets (15 mg total) daily with breakfast for 3 days, THEN 2 tablets (10 mg total) daily with breakfast for 3 days, THEN 1 tablet (5 mg total) daily with breakfast for 3 days.    Dispense:  30 tablet    Refill:  0   predniSONE  (DELTASONE ) 5 MG tablet    Sig: Take 1 tablet (5 mg total) by mouth daily with breakfast.    Dispense:  90 tablet    Refill:  0     Follow-Up Instructions: Return in about 3 months (around 08/08/2024) for RA on UPA/LEF/GC f/u 3mos.   Lonni LELON Ester, MD  Note - This record has been created using [employer].  Chart creation errors have been sought, but may not always  have been located. Such creation errors do not reflect on  the standard of medical care.

## 2024-05-01 ENCOUNTER — Other Ambulatory Visit: Payer: Self-pay

## 2024-05-02 ENCOUNTER — Encounter (INDEPENDENT_AMBULATORY_CARE_PROVIDER_SITE_OTHER): Payer: Self-pay

## 2024-05-02 ENCOUNTER — Other Ambulatory Visit (HOSPITAL_COMMUNITY): Payer: Self-pay

## 2024-05-03 ENCOUNTER — Other Ambulatory Visit (HOSPITAL_COMMUNITY): Payer: Self-pay

## 2024-05-03 ENCOUNTER — Other Ambulatory Visit: Payer: Self-pay

## 2024-05-03 ENCOUNTER — Other Ambulatory Visit: Payer: Self-pay | Admitting: Pharmacy Technician

## 2024-05-03 MED ORDER — HYDROCHLOROTHIAZIDE 25 MG PO TABS
25.0000 mg | ORAL_TABLET | Freq: Every morning | ORAL | 0 refills | Status: DC
  Filled 2024-05-03: qty 30, 30d supply, fill #0

## 2024-05-03 MED ORDER — ROSUVASTATIN CALCIUM 10 MG PO TABS
10.0000 mg | ORAL_TABLET | Freq: Every day | ORAL | 0 refills | Status: DC
  Filled 2024-05-03: qty 90, 90d supply, fill #0

## 2024-05-03 NOTE — Progress Notes (Signed)
 Specialty Pharmacy Refill Coordination Note  Tammy Garrison is a 33 y.o. female contacted today regarding refills of specialty medication(s) Upadacitinib  (Rinvoq )   Patient requested (Patient-Rptd) Delivery   Delivery date: 05/04/24 Verified address: (Patient-Rptd) [address]   Medication will be filled on 05/03/24.

## 2024-05-08 ENCOUNTER — Ambulatory Visit: Attending: Internal Medicine | Admitting: Internal Medicine

## 2024-05-08 ENCOUNTER — Other Ambulatory Visit: Payer: Self-pay

## 2024-05-08 ENCOUNTER — Encounter: Payer: Self-pay | Admitting: Internal Medicine

## 2024-05-08 ENCOUNTER — Other Ambulatory Visit (HOSPITAL_COMMUNITY): Payer: Self-pay

## 2024-05-08 VITALS — BP 156/101 | HR 85 | Temp 96.7°F | Resp 16 | Ht 72.0 in | Wt 303.2 lb

## 2024-05-08 DIAGNOSIS — M069 Rheumatoid arthritis, unspecified: Secondary | ICD-10-CM | POA: Insufficient documentation

## 2024-05-08 DIAGNOSIS — Z79899 Other long term (current) drug therapy: Secondary | ICD-10-CM | POA: Insufficient documentation

## 2024-05-08 DIAGNOSIS — C210 Malignant neoplasm of anus, unspecified: Secondary | ICD-10-CM | POA: Diagnosis not present

## 2024-05-08 DIAGNOSIS — G894 Chronic pain syndrome: Secondary | ICD-10-CM | POA: Diagnosis not present

## 2024-05-08 MED ORDER — PREDNISONE 5 MG PO TABS
5.0000 mg | ORAL_TABLET | Freq: Every day | ORAL | 0 refills | Status: DC
  Filled 2024-05-08 – 2024-05-17 (×2): qty 30, 30d supply, fill #0
  Filled 2024-06-12: qty 30, 30d supply, fill #1
  Filled 2024-07-01 – 2024-07-05 (×2): qty 30, 30d supply, fill #2

## 2024-05-08 MED ORDER — PREDNISONE 5 MG PO TABS
ORAL_TABLET | ORAL | 0 refills | Status: AC
  Filled 2024-05-08: qty 30, 12d supply, fill #0

## 2024-05-09 ENCOUNTER — Other Ambulatory Visit: Payer: Self-pay

## 2024-05-10 ENCOUNTER — Other Ambulatory Visit (HOSPITAL_COMMUNITY): Payer: Self-pay

## 2024-05-10 ENCOUNTER — Encounter: Payer: Self-pay | Admitting: Hematology

## 2024-05-10 LAB — COMPREHENSIVE METABOLIC PANEL WITH GFR
AG Ratio: 1.6 (calc) (ref 1.0–2.5)
ALT: 85 U/L — ABNORMAL HIGH (ref 6–29)
AST: 45 U/L — ABNORMAL HIGH (ref 10–30)
Albumin: 4.6 g/dL (ref 3.6–5.1)
Alkaline phosphatase (APISO): 90 U/L (ref 31–125)
BUN: 11 mg/dL (ref 7–25)
CO2: 27 mmol/L (ref 20–32)
Calcium: 10.1 mg/dL (ref 8.6–10.2)
Chloride: 101 mmol/L (ref 98–110)
Creat: 0.73 mg/dL (ref 0.50–0.97)
Globulin: 2.8 g/dL (ref 1.9–3.7)
Glucose, Bld: 109 mg/dL — ABNORMAL HIGH (ref 65–99)
Potassium: 3.7 mmol/L (ref 3.5–5.3)
Sodium: 137 mmol/L (ref 135–146)
Total Bilirubin: 0.6 mg/dL (ref 0.2–1.2)
Total Protein: 7.4 g/dL (ref 6.1–8.1)
eGFR: 113 mL/min/1.73m2 (ref >=60)

## 2024-05-10 LAB — SEDIMENTATION RATE: Sed Rate: 25 mm/h — ABNORMAL HIGH (ref 0–20)

## 2024-05-10 LAB — CBC WITH DIFFERENTIAL/PLATELET
Absolute Lymphocytes: 2100 {cells}/uL (ref 850–3900)
Absolute Monocytes: 523 {cells}/uL (ref 200–950)
Basophils Absolute: 8 {cells}/uL (ref 0–200)
Basophils Relative: 0.1 %
Eosinophils Absolute: 50 {cells}/uL (ref 15–500)
Eosinophils Relative: 0.6 %
HCT: 35.7 % (ref 35.0–45.0)
Hemoglobin: 11.6 g/dL — ABNORMAL LOW (ref 11.7–15.5)
MCH: 30.2 pg (ref 27.0–33.0)
MCHC: 32.5 g/dL (ref 32.0–36.0)
MCV: 93 fL (ref 80.0–100.0)
MPV: 10.5 fL (ref 7.5–12.5)
Monocytes Relative: 6.3 %
Neutro Abs: 5619 {cells}/uL (ref 1500–7800)
Neutrophils Relative %: 67.7 %
Platelets: 249 Thousand/uL (ref 140–400)
RBC: 3.84 Million/uL (ref 3.80–5.10)
RDW: 14.1 % (ref 11.0–15.0)
Total Lymphocyte: 25.3 %
WBC: 8.3 Thousand/uL (ref 3.8–10.8)

## 2024-05-10 LAB — QUANTIFERON-TB GOLD PLUS
Mitogen-NIL: 6.88 [IU]/mL
NIL: 0.01 [IU]/mL
QuantiFERON-TB Gold Plus: NEGATIVE
TB1-NIL: 0.01 [IU]/mL
TB2-NIL: 0.01 [IU]/mL

## 2024-05-15 ENCOUNTER — Other Ambulatory Visit (HOSPITAL_COMMUNITY): Payer: Self-pay

## 2024-05-15 MED ORDER — NABUMETONE 500 MG PO TABS
500.0000 mg | ORAL_TABLET | Freq: Two times a day (BID) | ORAL | 2 refills | Status: AC | PRN
  Filled 2024-05-15 – 2024-07-01 (×2): qty 60, 30d supply, fill #0

## 2024-05-15 MED ORDER — OZEMPIC (2 MG/DOSE) 8 MG/3ML ~~LOC~~ SOPN
2.0000 mg | PEN_INJECTOR | SUBCUTANEOUS | 2 refills | Status: DC
  Filled 2024-05-15: qty 3, 28d supply, fill #0
  Filled 2024-06-12: qty 3, 28d supply, fill #1
  Filled 2024-07-16: qty 3, 28d supply, fill #2

## 2024-05-17 ENCOUNTER — Other Ambulatory Visit (HOSPITAL_COMMUNITY): Payer: Self-pay

## 2024-05-17 ENCOUNTER — Other Ambulatory Visit: Payer: Self-pay

## 2024-05-26 ENCOUNTER — Other Ambulatory Visit (HOSPITAL_COMMUNITY): Payer: Self-pay

## 2024-05-26 ENCOUNTER — Other Ambulatory Visit: Payer: Self-pay | Admitting: Physician Assistant

## 2024-05-26 ENCOUNTER — Other Ambulatory Visit: Payer: Self-pay

## 2024-05-26 MED ORDER — PANTOPRAZOLE SODIUM 40 MG PO TBEC
40.0000 mg | DELAYED_RELEASE_TABLET | Freq: Two times a day (BID) | ORAL | 1 refills | Status: DC
  Filled 2024-05-26: qty 60, 30d supply, fill #0
  Filled 2024-07-01: qty 60, 30d supply, fill #1

## 2024-05-26 MED ORDER — HYDROCHLOROTHIAZIDE 25 MG PO TABS
25.0000 mg | ORAL_TABLET | Freq: Every morning | ORAL | 1 refills | Status: DC
  Filled 2024-05-26: qty 30, 30d supply, fill #0
  Filled 2024-07-01: qty 30, 30d supply, fill #1

## 2024-05-28 ENCOUNTER — Other Ambulatory Visit (HOSPITAL_COMMUNITY): Payer: Self-pay

## 2024-05-29 ENCOUNTER — Other Ambulatory Visit: Payer: Self-pay

## 2024-05-30 ENCOUNTER — Other Ambulatory Visit: Payer: Self-pay | Admitting: *Deleted

## 2024-05-30 ENCOUNTER — Other Ambulatory Visit: Payer: Self-pay

## 2024-05-30 ENCOUNTER — Other Ambulatory Visit: Payer: Self-pay | Admitting: Internal Medicine

## 2024-05-30 DIAGNOSIS — M25532 Pain in left wrist: Secondary | ICD-10-CM

## 2024-05-30 DIAGNOSIS — M069 Rheumatoid arthritis, unspecified: Secondary | ICD-10-CM

## 2024-05-30 DIAGNOSIS — Z79899 Other long term (current) drug therapy: Secondary | ICD-10-CM

## 2024-05-30 MED ORDER — RINVOQ 15 MG PO TB24
15.0000 mg | ORAL_TABLET | Freq: Every day | ORAL | 2 refills | Status: AC
  Filled 2024-05-31 – 2024-07-03 (×6): qty 30, 30d supply, fill #0
  Filled 2024-07-26: qty 30, 30d supply, fill #1
  Filled 2024-08-22: qty 30, 30d supply, fill #2

## 2024-05-30 NOTE — Telephone Encounter (Signed)
 Attempted to contact patient and left message to advise patient to call the office and schedule appointment.

## 2024-05-30 NOTE — Telephone Encounter (Signed)
 Please schedule patient a follow up visit. Patient due January 2026. Thanks!   Follow-Up Instructions: Return in about 3 months (around 08/08/2024) for RA on UPA/LEF/GC f/u 3mos.

## 2024-05-30 NOTE — Telephone Encounter (Signed)
 Last Fill: 04/18/2024 (30 day supply)  Labs: 05/08/2024 Glucose 109, AST 45, ALT 85, Hgb 11.6  TB Gold: 05/08/2024 Neg    Next Visit: Due January 2026. Message sent to the front to schedule.   Last Visit: 05/08/2024  DX: Rheumatoid arthritis involving multiple sites, unspecified whether rheumatoid factor present   Current Dose per office note 05/08/2024: Rinvoq  15 mg PO daily   Okay to refill Rinvoq ?

## 2024-05-31 ENCOUNTER — Other Ambulatory Visit (HOSPITAL_COMMUNITY): Payer: Self-pay

## 2024-05-31 ENCOUNTER — Other Ambulatory Visit: Payer: Self-pay

## 2024-06-01 ENCOUNTER — Other Ambulatory Visit: Payer: Self-pay

## 2024-06-01 ENCOUNTER — Other Ambulatory Visit (HOSPITAL_COMMUNITY): Payer: Self-pay

## 2024-06-01 MED ORDER — LISINOPRIL 10 MG PO TABS
10.0000 mg | ORAL_TABLET | Freq: Every day | ORAL | 5 refills | Status: AC
  Filled 2024-06-01: qty 30, 30d supply, fill #0
  Filled 2024-07-01: qty 30, 30d supply, fill #1
  Filled 2024-08-02: qty 30, 30d supply, fill #2

## 2024-06-02 ENCOUNTER — Other Ambulatory Visit: Payer: Self-pay

## 2024-06-02 ENCOUNTER — Emergency Department (HOSPITAL_COMMUNITY): Admission: EM | Admit: 2024-06-02 | Discharge: 2024-06-02 | Disposition: A | Discharge status: Home / Self Care

## 2024-06-02 ENCOUNTER — Emergency Department (HOSPITAL_COMMUNITY)

## 2024-06-02 ENCOUNTER — Encounter (HOSPITAL_COMMUNITY): Payer: Self-pay

## 2024-06-02 DIAGNOSIS — R112 Nausea with vomiting, unspecified: Secondary | ICD-10-CM | POA: Insufficient documentation

## 2024-06-02 DIAGNOSIS — R1032 Left lower quadrant pain: Secondary | ICD-10-CM | POA: Diagnosis present

## 2024-06-02 LAB — COMPREHENSIVE METABOLIC PANEL WITH GFR
ALT: 67 U/L — ABNORMAL HIGH (ref 0–44)
AST: 40 U/L (ref 15–41)
Albumin: 4.3 g/dL (ref 3.5–5.0)
Alkaline Phosphatase: 110 U/L (ref 38–126)
Anion gap: 12 (ref 5–15)
BUN: 9 mg/dL (ref 6–20)
CO2: 26 mmol/L (ref 22–32)
Calcium: 10.1 mg/dL (ref 8.9–10.3)
Chloride: 104 mmol/L (ref 98–111)
Creatinine, Ser: 0.69 mg/dL (ref 0.44–1.00)
GFR, Estimated: >60 mL/min (ref >60)
Glucose, Bld: 94 mg/dL (ref 70–99)
Potassium: 3.6 mmol/L (ref 3.5–5.1)
Sodium: 142 mmol/L (ref 135–145)
Total Bilirubin: 0.5 mg/dL (ref 0.0–1.2)
Total Protein: 7.7 g/dL (ref 6.5–8.1)

## 2024-06-02 LAB — CBC WITH DIFFERENTIAL/PLATELET
Abs Immature Granulocytes: 0.03 K/uL (ref 0.00–0.07)
Basophils Absolute: 0 K/uL (ref 0.0–0.1)
Basophils Relative: 0 %
Eosinophils Absolute: 0.1 K/uL (ref 0.0–0.5)
Eosinophils Relative: 1 %
HCT: 39.3 % (ref 36.0–46.0)
Hemoglobin: 12.1 g/dL (ref 12.0–15.0)
Immature Granulocytes: 0 %
Lymphocytes Relative: 16 %
Lymphs Abs: 1.4 K/uL (ref 0.7–4.0)
MCH: 28.8 pg (ref 26.0–34.0)
MCHC: 30.8 g/dL (ref 30.0–36.0)
MCV: 93.6 fL (ref 80.0–100.0)
Monocytes Absolute: 0.7 K/uL (ref 0.1–1.0)
Monocytes Relative: 8 %
Neutro Abs: 6.5 K/uL (ref 1.7–7.7)
Neutrophils Relative %: 75 %
Platelets: 273 K/uL (ref 150–400)
RBC: 4.2 MIL/uL (ref 3.87–5.11)
RDW: 14.2 % (ref 11.5–15.5)
WBC: 8.8 K/uL (ref 4.0–10.5)
nRBC: 0 % (ref 0.0–0.2)

## 2024-06-02 LAB — URINALYSIS, ROUTINE W REFLEX MICROSCOPIC
Bilirubin Urine: NEGATIVE
Glucose, UA: NEGATIVE mg/dL
Hgb urine dipstick: NEGATIVE
Ketones, ur: 5 mg/dL — AB
Leukocytes,Ua: NEGATIVE
Nitrite: NEGATIVE
Protein, ur: NEGATIVE mg/dL
Specific Gravity, Urine: 1.041 — ABNORMAL HIGH (ref 1.005–1.030)
pH: 7 (ref 5.0–8.0)

## 2024-06-02 LAB — LIPASE, BLOOD: Lipase: 57 U/L — ABNORMAL HIGH (ref 11–51)

## 2024-06-02 LAB — HCG, SERUM, QUALITATIVE: Preg, Serum: NEGATIVE

## 2024-06-02 MED ORDER — ONDANSETRON HCL 4 MG/2ML IJ SOLN
4.0000 mg | Freq: Once | INTRAMUSCULAR | Status: AC
  Administered 2024-06-02: 4 mg via INTRAVENOUS
  Filled 2024-06-02: qty 2

## 2024-06-02 MED ORDER — MORPHINE SULFATE (PF) 4 MG/ML IV SOLN
4.0000 mg | Freq: Once | INTRAVENOUS | Status: AC
  Administered 2024-06-02: 4 mg via INTRAVENOUS
  Filled 2024-06-02: qty 1

## 2024-06-02 MED ORDER — DICYCLOMINE HCL 20 MG PO TABS: 20.0000 mg | ORAL_TABLET | Freq: Two times a day (BID) | ORAL | 0 refills | Status: AC

## 2024-06-02 MED ORDER — LOPERAMIDE HCL 2 MG PO CAPS: 2.0000 mg | ORAL_CAPSULE | Freq: Four times a day (QID) | ORAL | 0 refills | Status: AC | PRN

## 2024-06-02 MED ORDER — KETOROLAC TROMETHAMINE 15 MG/ML IJ SOLN
15.0000 mg | Freq: Once | INTRAMUSCULAR | Status: AC
  Administered 2024-06-02: 15 mg via INTRAVENOUS
  Filled 2024-06-02: qty 1

## 2024-06-02 MED ORDER — ONDANSETRON 4 MG PO TBDP: 4.0000 mg | ORAL_TABLET | Freq: Three times a day (TID) | ORAL | 0 refills | Status: AC | PRN

## 2024-06-02 MED ORDER — IOHEXOL 300 MG/ML  SOLN
100.0000 mL | Freq: Once | INTRAMUSCULAR | Status: AC | PRN
  Administered 2024-06-02: 100 mL via INTRAVENOUS

## 2024-06-02 MED ORDER — SODIUM CHLORIDE 0.9 % IV SOLN: 12.5000 mg | Freq: Four times a day (QID) | INTRAVENOUS | Status: DC | PRN

## 2024-06-02 MED ORDER — DIPHENHYDRAMINE HCL 50 MG/ML IJ SOLN
25.0000 mg | Freq: Once | INTRAMUSCULAR | Status: AC
  Administered 2024-06-02: 25 mg via INTRAVENOUS
  Filled 2024-06-02: qty 1

## 2024-06-02 MED ORDER — SODIUM CHLORIDE 0.9 % IV SOLN
12.5000 mg | Freq: Once | INTRAVENOUS | Status: AC
  Administered 2024-06-02: 12.5 mg via INTRAVENOUS
  Filled 2024-06-02: qty 0.5
  Filled 2024-06-02: qty 12.5
  Filled 2024-06-02: qty 0.5

## 2024-06-02 MED ORDER — SODIUM CHLORIDE 0.9 % IV BOLUS
1000.0000 mL | Freq: Once | INTRAVENOUS | Status: AC
  Administered 2024-06-02: 1000 mL via INTRAVENOUS

## 2024-06-02 MED ORDER — PROMETHAZINE HCL 25 MG RE SUPP: 25.0000 mg | Freq: Four times a day (QID) | RECTAL | 0 refills | Status: AC | PRN

## 2024-06-02 NOTE — ED Triage Notes (Signed)
 Pt BIBA from home, c/o abdominal pain and vomiting for 3 days.  Denies chance of being pregnant. VSS.

## 2024-06-02 NOTE — ED Provider Notes (Signed)
 [place] EMERGENCY DEPARTMENT AT [facility] Provider Note   CSN: [id number] Arrival date & time: 06/02/24  1549     Patient presents with: Abdominal Pain and Emesis   Tammy Garrison is a 33 y.o. female patient with past medical history of bipolar 1, alcohol abuse, rheumatoid arthritis is presenting to emergency room with complaint of abdominal pain.  She notes this has been ongoing for 3 days and associate with nausea vomiting and diarrhea.  Patient locates her pain to the left lower quadrant.  She also reports mild headache and lightheadedness.  She denies injury trauma or fall.  She denies fever.  She reports she is hardly been able to keep anything down and feels dehydrated.  She is passing gas and her last bowel movement was just prior to arrival.    Abdominal Pain Associated symptoms: vomiting   Emesis Associated symptoms: abdominal pain        Prior to Admission medications   Medication Sig Start Date End Date Taking? Authorizing Provider  dicyclomine  (BENTYL ) 20 MG tablet Take 1 tablet (20 mg total) by mouth 2 (two) times daily. 06/02/24  Yes Riyad Keena, Warren SAILOR, PA-C  loperamide (IMODIUM) 2 MG capsule Take 1 capsule (2 mg total) by mouth 4 (four) times daily as needed for diarrhea or loose stools. 06/02/24  Yes Ayo Smoak, Warren SAILOR, PA-C  ondansetron  (ZOFRAN -ODT) 4 MG disintegrating tablet Take 1 tablet (4 mg total) by mouth every 8 (eight) hours as needed for nausea or vomiting. 06/02/24  Yes Elainah Rhyne, Warren SAILOR, PA-C  promethazine  (PHENERGAN ) 25 MG suppository Place 1 suppository (25 mg total) rectally every 6 (six) hours as needed for nausea or vomiting. 06/02/24  Yes Loralei Radcliffe N, PA-C  acetaminophen  (TYLENOL ) 325 MG tablet Take 2 tablets (650 mg total) by mouth every 6 (six) hours as needed for mild pain. 12/01/22   Jadine Toribio SQUIBB, MD  benzonatate  (TESSALON ) 100 MG capsule Take 1-2 capsules (100-200 mg total) by mouth every 8 (eight) hours as needed  for cough and congestion. 11/29/23     cetirizine  (ZYRTEC ) 10 MG tablet Take 1 tablet (10 mg total) by mouth every evening as needed for allergies. 03/20/24     diclofenac  (VOLTAREN ) 75 MG EC tablet Take 1 tablet (75 mg total) by mouth 2 (two) times daily as needed with food . 11/23/23     DULoxetine  (CYMBALTA ) 60 MG capsule Take 1 capsule (60 mg total) by mouth at bedtime. 04/18/24   Rice, Lonni ORN, MD  famotidine  (PEPCID ) 40 MG tablet Take 1 tablet (40 mg total) by mouth 2 (two) times daily. 09/28/23   Beather Delon Gibson, PA  gabapentin  (NEURONTIN ) 300 MG capsule Take 1 capsule (300 mg total) by mouth 2 (two) times daily. 02/26/24     hydrochlorothiazide  (HYDRODIURIL ) 25 MG tablet Take 1 tablet (25 mg total) by mouth every morning. Stop Losartan . 05/26/24     leflunomide  (ARAVA ) 20 MG tablet Take 1 tablet (20 mg total) by mouth at bedtime. 04/18/24   Rice, Lonni ORN, MD  lisinopril (ZESTRIL) 10 MG tablet Take 1 tablet (10 mg total) by mouth daily. 06/01/24     losartan  (COZAAR ) 100 MG tablet Take 1 tablet (100 mg total) by mouth daily. Patient not taking: Reported on 05/08/2024 02/23/24     nabumetone (RELAFEN) 500 MG tablet Take 1 tablet (500 mg total) by mouth 2 (two) times daily as needed for pains. 05/15/24     oxybutynin  (DITROPAN -XL) 10 MG 24 hr tablet  Take 1 tablet (10 mg total) by mouth daily with dinner. 01/24/24     pantoprazole  (PROTONIX ) 40 MG tablet Take 1 tablet (40 mg total) by mouth 2 (two) times daily before a meal. 05/26/24 07/25/24  Craig Alan SAUNDERS, PA-C  predniSONE  (DELTASONE ) 5 MG tablet Take 1 tablet (5 mg total) by mouth daily with breakfast. 05/08/24   Jeannetta Lonni ORN, MD  QUEtiapine  (SEROQUEL ) 200 MG tablet Take 1 tablet (200 mg total) by mouth at bedtime. Patient taking differently: Take 200 mg by mouth as needed. 03/17/24     rosuvastatin  (CRESTOR ) 10 MG tablet Take 1 tablet (10 mg total) by mouth daily. 05/03/24     Semaglutide , 1 MG/DOSE, (OZEMPIC , 1 MG/DOSE,) 4  MG/3ML SOPN Inject 1 mg into the skin once a week. 04/18/24     Semaglutide , 2 MG/DOSE, (OZEMPIC , 2 MG/DOSE,) 8 MG/3ML SOPN Inject 2 mg into the skin once a week. 05/15/24     tiZANidine  (ZANAFLEX ) 4 MG tablet Take 1 tablet (4 mg total) by mouth 2 (two) times daily as needed. 11/04/23   Jeannetta Lonni ORN, MD  Upadacitinib  ER (RINVOQ ) 15 MG TB24 Take 1 tablet (15 mg total) by mouth daily. 05/30/24   Jeannetta Lonni ORN, MD  valACYclovir  (VALTREX ) 500 MG tablet Take 1 tablet (500 mg total) by mouth daily for prevention of outbreak 10/21/23     Vitamin D , Ergocalciferol , (DRISDOL ) 1.25 MG (50000 UNIT) CAPS capsule Take 1 capsule (50,000 Units total) by mouth once a week. 11/16/23       Allergies: Coconut flavoring agent (non-screening) and Flavoring agent    Review of Systems  Gastrointestinal:  Positive for abdominal pain and vomiting.    Updated Vital Signs BP (!) 139/105   Pulse 94   Temp 98.6 F (37 C) (Oral)   Resp 17   SpO2 97%   Physical Exam Vitals and nursing note reviewed.  Constitutional:      General: She is not in acute distress.    Appearance: She is not toxic-appearing.  HENT:     Head: Normocephalic and atraumatic.  Eyes:     General: No scleral icterus.    Conjunctiva/sclera: Conjunctivae normal.  Cardiovascular:     Rate and Rhythm: Normal rate and regular rhythm.     Pulses: Normal pulses.     Heart sounds: Normal heart sounds.  Pulmonary:     Effort: Pulmonary effort is normal. No respiratory distress.     Breath sounds: Normal breath sounds.  Abdominal:     General: Abdomen is flat. Bowel sounds are normal.     Palpations: Abdomen is soft.     Tenderness: There is abdominal tenderness in the left lower quadrant.  Skin:    General: Skin is warm and dry.     Findings: No lesion.  Neurological:     General: No focal deficit present.     Mental Status: She is alert and oriented to person, place, and time. Mental status is at baseline.     (all labs  ordered are listed, but only abnormal results are displayed) Labs Reviewed  COMPREHENSIVE METABOLIC PANEL WITH GFR - Abnormal; Notable for the following components:      Result Value   ALT 67 (*)    All other components within normal limits  LIPASE, BLOOD - Abnormal; Notable for the following components:   Lipase 57 (*)    All other components within normal limits  URINALYSIS, ROUTINE W REFLEX MICROSCOPIC - Abnormal; Notable for the  following components:   Specific Gravity, Urine 1.041 (*)    Ketones, ur 5 (*)    All other components within normal limits  CBC WITH DIFFERENTIAL/PLATELET  HCG, SERUM, QUALITATIVE    EKG: None  Radiology: CT ABDOMEN PELVIS W CONTRAST Result Date: 06/02/2024 CLINICAL DATA:  Left lower quadrant pain. EXAM: CT ABDOMEN AND PELVIS WITH CONTRAST TECHNIQUE: Multidetector CT imaging of the abdomen and pelvis was performed using the standard protocol following bolus administration of intravenous contrast. RADIATION DOSE REDUCTION: This exam was performed according to the departmental dose-optimization program which includes automated exposure control, adjustment of the mA and/or kV according to patient size and/or use of iterative reconstruction technique. CONTRAST:  OMNIPAQUE  IOHEXOL  300 MG/ML  SOLN COMPARISON:  March 22, 2024 FINDINGS: Lower chest: No acute abnormality. Hepatobiliary: There is diffuse fatty infiltration of the liver parenchyma. No gallstones, gallbladder wall thickening, or biliary dilatation. Pancreas: Unremarkable. No pancreatic ductal dilatation or surrounding inflammatory changes. Spleen: Normal in size without focal abnormality. Adrenals/Urinary Tract: Adrenal glands are unremarkable. Kidneys are normal, without renal calculi, focal lesion, or hydronephrosis. Bladder is unremarkable. Stomach/Bowel: Stomach is within normal limits. Appendix appears normal. No evidence of bowel wall thickening, distention, or inflammatory changes.  Vascular/Lymphatic: No significant vascular findings are present. No enlarged abdominal or pelvic lymph nodes. Reproductive: Multiple heterogeneous partially calcified uterine fibroids are seen. The bilateral adnexa are unremarkable. Other: No abdominal wall hernia or abnormality. No abdominopelvic ascites. Musculoskeletal: No acute or significant osseous findings. IMPRESSION: 1. Hepatic steatosis. 2. Multiple heterogeneous partially calcified uterine fibroids. Electronically Signed   By: Suzen Dials M.D.   On: 06/02/2024 18:18     Procedures   Medications Ordered in the ED  sodium chloride  0.9 % bolus 1,000 mL (0 mLs Intravenous Stopped 06/02/24 1929)  ondansetron  (ZOFRAN ) injection 4 mg (4 mg Intravenous Given 06/02/24 1644)  morphine  (PF) 4 MG/ML injection 4 mg (4 mg Intravenous Given 06/02/24 1643)  iohexol  (OMNIPAQUE ) 300 MG/ML solution 100 mL (100 mLs Intravenous Contrast Given 06/02/24 1756)  diphenhydrAMINE  (BENADRYL ) injection 25 mg (25 mg Intravenous Given 06/02/24 1920)  promethazine  (PHENERGAN ) 12.5 mg in sodium chloride  0.9 % 50 mL IVPB (12.5 mg Intravenous New Bag/Given 06/02/24 1929)  ketorolac  (TORADOL ) 15 MG/ML injection 15 mg (15 mg Intravenous Given 06/02/24 1920)    Clinical Course as of 06/02/24 2057  Fri Jun 02, 2024  1844 Failed PO challenge. Will trial phenergan .  [JB]  1915 Has tolerated Toradol  in the past, will add Toradol . [JB]  2056 Passed PO challenge, reports feeling better. No BM through duration in ER visit.  [JB]    Clinical Course User Index [JB] Rexanna Louthan, Warren SAILOR, PA-C                                 Medical Decision Making Amount and/or Complexity of Data Reviewed Labs: ordered. Radiology: ordered.  Risk Prescription drug management.   This patient presents to the ED for concern of abdominal pain, this involves an extensive number of treatment options, and is a complaint that carries with it a high risk of complications and morbidity.  The  differential diagnosis includes cholecystitis, AAA, appendicitis, renal stone, UTI   Co morbidities that complicate the patient evaluation  Bipolar    Additional history obtained:  Additional history obtained from patient comments from urgent care I did review their notes she was sent for CT scan of abdomen and pelvis  Lab Tests:  I personally interpreted labs.  The pertinent results include:   No leukocytosis, CMP unremarkable, lipase 57.  Hcg Negative. UA small ketones but negative for nitrite or leukocyte   Imaging Studies ordered:  I ordered imaging studies including Ct abd/pelvis   I independently visualized and interpreted imaging which showed hepatosteatosis and uterine fibroids no acute findings I agree with the radiologist interpretation   Cardiac Monitoring: / EKG:  The patient was maintained on a cardiac monitor.     Problem List / ED Course / Critical interventions / Medication management  Patient is reporting to emergency room with generalized abdominal pain nausea vomiting diarrhea.  She reports this has been ongoing for 3 days.  She is noting 3-5 loose stools a day.  She notes several episodes of vomiting worse after eating.  She is hemodynamically stable.  Has not noted blood in vomit.  No blood or pus in stool.  Her CT scan here shows no acute findings.  Her CBC, CMP and lipase are unremarkable.  Her pregnancy test is negative.  Her urine is negative. I ordered medication including zofran , morphine , NS Reevaluation of the patient after these medicines showed that the patient improved I have reviewed the patients home medicines and have made adjustments as needed. At this time suspect likely gastroenteritis with otherwise reassuring workup.  Given duration of symptoms and no fever do not feel a stool sample needed at this time. Patient is feeling better after treatment here.  Not tolerating oral intake.  Feel appropriate for discharge with outpatient follow-up.   Will send her home with nausea medicine and Imodium as needed.  Given return precautions.      Final diagnoses:  Nausea vomiting and diarrhea    ED Discharge Orders          Ordered    loperamide (IMODIUM) 2 MG capsule  4 times daily PRN        06/02/24 2011    ondansetron  (ZOFRAN -ODT) 4 MG disintegrating tablet  Every 8 hours PRN        06/02/24 2011    promethazine  (PHENERGAN ) 25 MG suppository  Every 6 hours PRN        06/02/24 2011    dicyclomine  (BENTYL ) 20 MG tablet  2 times daily        06/02/24 2054               Vanna Shavers, Warren SAILOR, PA-C 06/02/24 2058    Neysa Caron PARAS, DO 06/07/24 [phone]

## 2024-06-02 NOTE — Progress Notes (Signed)
 Subjective   Patient ID:  Tammy Garrison is a 33 y.o. (DOB 06-03-1991) female    Patient presents with  . Dizziness    Patient c/o dizziness and vomiting x 3 days.  She notes that when she stands it feels as if she is going to pass out.     HPI  33 year old female presents with vomiting and diarrhea for 3 days.  States she vomits every time that she eats or drinks and has had too many episodes of diarrhea to count.  She is also feeling dizzy with any movement.  Symptoms subside when head is held still.  She has not had any recent antibiotics, no known contaminated foods and no contacts with similar symptoms.  States she takes Ozempic  less than she took it was on Tuesday, she does not feel like this medication.  Blood pressure is also elevated, she has only blood pressure medications and she took them this morning.   Reviewed and updated this visit by provider: Tobacco  Allergies  Meds  Problems  Med Hx  Surg Hx  Fam Hx         Objective   Vitals:   06/02/24 1505 06/02/24 1508  BP: (!) 139/93 (!) 143/101  Patient Position: Lying Lying  Pulse: 108 102  Temp: 98.1 F (36.7 C)   TempSrc: Oral   Resp: 21   Height: 6' (1.829 m)   Weight: 296 lb (134.3 kg)   SpO2: 95%   BMI (Calculated): 40.1   PainSc:   6   PainLoc: Abdomen      Physical Exam Constitutional:      General: She is not in acute distress.    Appearance: Normal appearance. She is ill-appearing. She is not toxic-appearing or diaphoretic.  HENT:     Head: Normocephalic and atraumatic.     Nose: Nose normal.  Eyes:     Extraocular Movements: Extraocular movements intact.     Conjunctiva/sclera: Conjunctivae normal.  Cardiovascular:     Rate and Rhythm: Regular rhythm. Tachycardia present.     Pulses: Normal pulses.     Heart sounds: Normal heart sounds. No murmur heard.    No friction rub. No gallop.     Comments: Tachy likely due to dehydration Pulmonary:     Effort: Pulmonary effort is normal. No  respiratory distress.     Breath sounds: Normal breath sounds.  Abdominal:     General: Bowel sounds are normal. There is no distension.     Palpations: Abdomen is soft. There is no mass.     Tenderness: There is generalized abdominal tenderness and tenderness in the right upper quadrant, right lower quadrant, left upper quadrant and left lower quadrant. There is no guarding. Positive signs include Murphy's sign, Rovsing's sign and McBurney's sign.     Hernia: No hernia is present.  Skin:    General: Skin is dry.     Coloration: Skin is not jaundiced or pale.     Findings: No bruising, erythema or rash.  Neurological:     Mental Status: She is alert.     Motor: Weakness present.     Gait: Gait normal.  Psychiatric:        Thought Content: Thought content normal.        No results found for this or any previous visit.   No results found.  Assessment and Plan  1. Acute abdomen (Primary) -     [facility] REFERRAL TO ED; Future 2. Elevated blood pressure reading  3. Dizziness    Cannot rule out acute abdomen such as appendicitis, diverticulitis, SBO.  Recommend ER for further evaluation, patient verbalizes understanding EMS called and patient transferred to ER.     Risks, benefits, and alternatives of the medications and treatment plan prescribed today were discussed, and patient expressed understanding. Plan follow-up as discussed or as needed if any worsening symptoms or change in condition.

## 2024-06-02 NOTE — Discharge Instructions (Addendum)
 I recommend taking Zofran  as needed for nausea and vomiting.  If you have refractory nausea vomiting you can use Phenergan  suppositories. Take Imodium as needed for loose stool or diarrhea. Use Bentyl  as needed for abdominal cramping.  Recommend staying well-hydrated with primarily water but you can alternate Pedialyte or Gatorade drink.  Try bland foods like soup, broth and progress back slowly into your normal diet.  Return to ER with new or worsening symptoms.

## 2024-06-12 ENCOUNTER — Other Ambulatory Visit: Payer: Self-pay

## 2024-06-13 ENCOUNTER — Ambulatory Visit: Admitting: Physical Medicine and Rehabilitation

## 2024-06-13 DIAGNOSIS — M79642 Pain in left hand: Secondary | ICD-10-CM

## 2024-06-13 DIAGNOSIS — R29898 Other symptoms and signs involving the musculoskeletal system: Secondary | ICD-10-CM | POA: Diagnosis not present

## 2024-06-13 DIAGNOSIS — R202 Paresthesia of skin: Secondary | ICD-10-CM | POA: Diagnosis not present

## 2024-06-13 NOTE — Progress Notes (Signed)
 Pain Scale   Average Pain 3 Patient advised she has Numbness/tingling to left hand and occ pain.         +Driver, -BT, -Dye Allergies.

## 2024-06-15 NOTE — Procedures (Signed)
 EMG & NCV Findings: All nerve conduction studies (as indicated in the following tables) were within normal limits.    All examined muscles (as indicated in the following table) showed no evidence of electrical instability.    Impression: Essentially NORMAL electrodiagnostic study of the left upper limb.  There is no significant electrodiagnostic evidence of nerve entrapment, brachial plexopathy or cervical radiculopathy.    As you know, purely sensory or demyelinating radiculopathies and chemical radiculitis may not be detected with this particular electrodiagnostic study. **This electrodiagnostic study cannot rule out small fiber polyneuropathy and dysesthesias from central pain syndromes such as stroke or central pain sensitization syndromes such as fibromyalgia.  Myotomal referral pain from trigger points is also not excluded.  Recommendations: 1.  Follow-up with referring physician. 2.  Continue current management of symptoms.  ___________________________ Prentice Masters FAAPMR Board Certified, American Board of Physical Medicine and Rehabilitation    Nerve Conduction Studies Anti Sensory Summary Table   Stim Site NR Peak (ms) Norm Peak (ms) P-T Amp (V) Norm P-T Amp Site1 Site2 Delta-P (ms) Dist (cm) Vel (m/s) Norm Vel (m/s)  Left Median Acr Palm Anti Sensory (2nd Digit)  31.8C  Wrist    3.1 <3.6 48.9 >10 Wrist Palm 1.3 0.0    Palm    1.8 <2.0 5.9         Left Radial Anti Sensory (Base 1st Digit)  31.8C  Wrist    2.0 <3.1 78.4  Wrist Base 1st Digit 2.0 0.0    Left Ulnar Anti Sensory (5th Digit)  32.1C  Wrist    2.9 <3.7 44.5 >15.0 Wrist 5th Digit 2.9 14.0 48 >38   Motor Summary Table   Stim Site NR Onset (ms) Norm Onset (ms) O-P Amp (mV) Norm O-P Amp Site1 Site2 Delta-0 (ms) Dist (cm) Vel (m/s) Norm Vel (m/s)  Left Median Motor (Abd Poll Brev)  32.1C  Wrist    3.0 <4.2 9.0 >5 Elbow Wrist 4.0 24.0 60 >50  Elbow    7.0  8.6         Left Ulnar Motor (Abd Dig Min)  32.2C   Wrist    2.4 <4.2 11.2 >3 B Elbow Wrist 3.9 22.0 56 >53  B Elbow    6.3  7.3  A Elbow B Elbow 1.5 11.0 73 >53  A Elbow    7.8  9.2          EMG   Side Muscle Nerve Root Ins Act Fibs Psw Amp Dur Poly Recrt Int Bruna Comment  Left Abd Poll Brev Median C8-T1 Nml Nml Nml Nml Nml 0 Nml Nml   Left 1stDorInt Ulnar C8-T1 Nml Nml Nml Nml Nml 0 Nml Nml   Left PronatorTeres Median C6-7 Nml Nml Nml Nml Nml 0 Nml Nml   Left Biceps Musculocut C5-6 Nml Nml Nml Nml Nml 0 Nml Nml   Left Deltoid Axillary C5-6 Nml Nml Nml Nml Nml 0 Nml Nml     Nerve Conduction Studies Anti Sensory Left/Right Comparison   Stim Site L Lat (ms) R Lat (ms) L-R Lat (ms) L Amp (V) R Amp (V) L-R Amp (%) Site1 Site2 L Vel (m/s) R Vel (m/s) L-R Vel (m/s)  Median Acr Palm Anti Sensory (2nd Digit)  31.8C  Wrist 3.1   48.9   Wrist Palm     Palm 1.8   5.9         Radial Anti Sensory (Base 1st Digit)  31.8C  Wrist 2.0   78.4  Wrist Base 1st Digit     Ulnar Anti Sensory (5th Digit)  32.1C  Wrist 2.9   44.5   Wrist 5th Digit 48     Motor Left/Right Comparison   Stim Site L Lat (ms) R Lat (ms) L-R Lat (ms) L Amp (mV) R Amp (mV) L-R Amp (%) Site1 Site2 L Vel (m/s) R Vel (m/s) L-R Vel (m/s)  Median Motor (Abd Poll Brev)  32.1C  Wrist 3.0   9.0   Elbow Wrist 60    Elbow 7.0   8.6         Ulnar Motor (Abd Dig Min)  32.2C  Wrist 2.4   11.2   B Elbow Wrist 56    B Elbow 6.3   7.3   A Elbow B Elbow 73    A Elbow 7.8   9.2            Waveforms:

## 2024-06-20 ENCOUNTER — Other Ambulatory Visit: Payer: Self-pay

## 2024-06-21 ENCOUNTER — Other Ambulatory Visit (HOSPITAL_COMMUNITY): Payer: Self-pay

## 2024-06-24 ENCOUNTER — Encounter: Payer: Self-pay | Admitting: Physical Medicine and Rehabilitation

## 2024-06-24 NOTE — Progress Notes (Signed)
 Tammy Garrison - 33 y.o. female MRN [id number]  Date of birth: [birth year]  Office Visit Note: Visit Date: 06/13/2024 PCP: [facility] Referred by: Jeannetta Lonni ORN, MD  Subjective: Chief Complaint  Patient presents with   Left Hand - Pain   HPI: Tammy Garrison is a 33 y.o. female who comes in today at the request of Dr. Lonni Jeannetta for evaluation and management of chronic, worsening and severe pain, numbness and tingling in the Left upper extremities.  Patient is Right hand dominant.  She presents with a complex story of essentially left-sided body pain with tingling and numbness and swelling.  Her history is complicated by seropositive rheumatoid arthritis on high risk medications with trials of previous medications in the past.  She has had these prednisone  off and on.  She denies any right-sided complaints.  Her left hand is somewhat nondermatomal and global.  She gets weakness at times dropping things.  Some nocturnal complaints of numbness.  This been going on now for quite some time.  She denies any frank radicular symptoms down the arm.  She does have some neck pain and back pain in general.  She does have chronic pain syndrome and continues to take medications such as gabapentin  and Cymbalta  for chronic pain.  She is not diabetic specifically hemoglobin A1c's were borderline.  She has not found relief with other medications or time or treatment.  No prior electrodiagnostic study.   I spent more than 30 minutes speaking face-to-face with the patient with 50% of the time in counseling and discussing coordination of care.       Review of Systems  Musculoskeletal:  Positive for joint pain and neck pain.  Neurological:  Positive for tingling and focal weakness.  All other systems reviewed and are negative.  Otherwise per HPI.  Assessment & Plan: Visit Diagnoses:    ICD-10-CM   1. Paresthesia of skin  R20.2 NCV with EMG (electromyography)    2.  Left hand weakness  R29.898     3. Pain in left hand  M79.642        Plan: Impression: Clinically symptoms are global and nondermatomal and somewhat more related to may be swelling and joint or myofascial pain.  Electrodiagnostic study performed today.  Essentially NORMAL electrodiagnostic study of the left upper limb.  There is no significant electrodiagnostic evidence of nerve entrapment, brachial plexopathy or cervical radiculopathy.    As you know, purely sensory or demyelinating radiculopathies and chemical radiculitis may not be detected with this particular electrodiagnostic study. **This electrodiagnostic study cannot rule out small fiber polyneuropathy and dysesthesias from central pain syndromes such as stroke or central pain sensitization syndromes such as fibromyalgia.  Myotomal referral pain from trigger points is also not excluded.  Recommendations: 1.  Follow-up with referring physician. 2.  Continue current management of symptoms.  Meds & Orders: No orders of the defined types were placed in this encounter.   Orders Placed This Encounter  Procedures   NCV with EMG (electromyography)    Follow-up: Return for Lonni Jeannetta, MD.   Procedures: No procedures performed  EMG & NCV Findings: All nerve conduction studies (as indicated in the following tables) were within normal limits.    All examined muscles (as indicated in the following table) showed no evidence of electrical instability.    Impression: Essentially NORMAL electrodiagnostic study of the left upper limb.  There is no significant electrodiagnostic evidence of nerve entrapment, brachial plexopathy or cervical radiculopathy.  As you know, purely sensory or demyelinating radiculopathies and chemical radiculitis may not be detected with this particular electrodiagnostic study. **This electrodiagnostic study cannot rule out small fiber polyneuropathy and dysesthesias from central pain syndromes such as stroke  or central pain sensitization syndromes such as fibromyalgia.  Myotomal referral pain from trigger points is also not excluded.  Recommendations: 1.  Follow-up with referring physician. 2.  Continue current management of symptoms.  ___________________________ Prentice Masters FAAPMR Board Certified, American Board of Physical Medicine and Rehabilitation    Nerve Conduction Studies Anti Sensory Summary Table   Stim Site NR Peak (ms) Norm Peak (ms) P-T Amp (V) Norm P-T Amp Site1 Site2 Delta-P (ms) Dist (cm) Vel (m/s) Norm Vel (m/s)  Left Median Acr Palm Anti Sensory (2nd Digit)  31.8C  Wrist    3.1 <3.6 48.9 >10 Wrist Palm 1.3 0.0    Palm    1.8 <2.0 5.9         Left Radial Anti Sensory (Base 1st Digit)  31.8C  Wrist    2.0 <3.1 78.4  Wrist Base 1st Digit 2.0 0.0    Left Ulnar Anti Sensory (5th Digit)  32.1C  Wrist    2.9 <3.7 44.5 >15.0 Wrist 5th Digit 2.9 14.0 48 >38   Motor Summary Table   Stim Site NR Onset (ms) Norm Onset (ms) O-P Amp (mV) Norm O-P Amp Site1 Site2 Delta-0 (ms) Dist (cm) Vel (m/s) Norm Vel (m/s)  Left Median Motor (Abd Poll Brev)  32.1C  Wrist    3.0 <4.2 9.0 >5 Elbow Wrist 4.0 24.0 60 >50  Elbow    7.0  8.6         Left Ulnar Motor (Abd Dig Min)  32.2C  Wrist    2.4 <4.2 11.2 >3 B Elbow Wrist 3.9 22.0 56 >53  B Elbow    6.3  7.3  A Elbow B Elbow 1.5 11.0 73 >53  A Elbow    7.8  9.2          EMG   Side Muscle Nerve Root Ins Act Fibs Psw Amp Dur Poly Recrt Int Bruna Comment  Left Abd Poll Brev Median C8-T1 Nml Nml Nml Nml Nml 0 Nml Nml   Left 1stDorInt Ulnar C8-T1 Nml Nml Nml Nml Nml 0 Nml Nml   Left PronatorTeres Median C6-7 Nml Nml Nml Nml Nml 0 Nml Nml   Left Biceps Musculocut C5-6 Nml Nml Nml Nml Nml 0 Nml Nml   Left Deltoid Axillary C5-6 Nml Nml Nml Nml Nml 0 Nml Nml     Nerve Conduction Studies Anti Sensory Left/Right Comparison   Stim Site L Lat (ms) R Lat (ms) L-R Lat (ms) L Amp (V) R Amp (V) L-R Amp (%) Site1 Site2 L Vel (m/s) R Vel (m/s)  L-R Vel (m/s)  Median Acr Palm Anti Sensory (2nd Digit)  31.8C  Wrist 3.1   48.9   Wrist Palm     Palm 1.8   5.9         Radial Anti Sensory (Base 1st Digit)  31.8C  Wrist 2.0   78.4   Wrist Base 1st Digit     Ulnar Anti Sensory (5th Digit)  32.1C  Wrist 2.9   44.5   Wrist 5th Digit 48     Motor Left/Right Comparison   Stim Site L Lat (ms) R Lat (ms) L-R Lat (ms) L Amp (mV) R Amp (mV) L-R Amp (%) Site1 Site2 L Vel (m/s) R Vel (m/s)  L-R Vel (m/s)  Median Motor (Abd Poll Brev)  32.1C  Wrist 3.0   9.0   Elbow Wrist 60    Elbow 7.0   8.6         Ulnar Motor (Abd Dig Min)  32.2C  Wrist 2.4   11.2   B Elbow Wrist 56    B Elbow 6.3   7.3   A Elbow B Elbow 73    A Elbow 7.8   9.2            Waveforms:            Clinical History: No specialty comments available.   She reports that she quit smoking about 5 years ago. Her smoking use included cigarettes. She started smoking about 12 years ago. She has a 1.8 pack-year smoking history. She has been exposed to tobacco smoke. She has never used smokeless tobacco.  Recent Labs    02/01/24 1053  HGBA1C 5.9*    Objective:  VS:  HT:    WT:   BMI:     BP:   HR: bpm  TEMP: ( )  RESP:  Physical Exam Vitals and nursing note reviewed.  Constitutional:      General: She is not in acute distress.    Appearance: Normal appearance. She is well-developed. She is obese. She is not ill-appearing.  HENT:     Head: Normocephalic and atraumatic.  Eyes:     Conjunctiva/sclera: Conjunctivae normal.     Pupils: Pupils are equal, round, and reactive to light.  Cardiovascular:     Rate and Rhythm: Normal rate.     Pulses: Normal pulses.  Pulmonary:     Effort: Pulmonary effort is normal.  Musculoskeletal:        General: No swelling, tenderness or deformity.     Right lower leg: No edema.     Left lower leg: No edema.     Comments: Inspection reveals no atrophy of the bilateral APB or FDI or hand intrinsics. There is no swelling, color  changes, allodynia or dystrophic changes. There is 5 out of 5 strength in the bilateral wrist extension, finger abduction and long finger flexion. There is intact sensation to light touch in all dermatomal and peripheral nerve distributions. There is a negative Froment's test bilaterally. There is a negative Tinel's test at the bilateral wrist and elbow. There is a negative Phalen's test bilaterally. There is a negative Hoffmann's test bilaterally.  Skin:    General: Skin is warm and dry.     Findings: No erythema or rash.  Neurological:     General: No focal deficit present.     Mental Status: She is alert and oriented to person, place, and time.     Sensory: No sensory deficit.     Motor: No weakness or abnormal muscle tone.     Coordination: Coordination normal.     Gait: Gait normal.  Psychiatric:        Mood and Affect: Mood normal.        Behavior: Behavior normal.     Ortho Exam  Imaging: No results found.  Past Medical/Family/Surgical/Social History: Medications & Allergies reviewed per EMR, new medications updated. 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   Bipolar 1 disorder (HCC) 02/23/2022   RA (rheumatoid arthritis) (HCC) 02/07/2022   Syncope 02/06/2022   ABLA (acute blood loss anemia) 02/06/2022   BRBPR (bright red blood per rectum) 02/06/2022   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    Depression 05/20/2016   Marijuana dependence (HCC) 05/19/2016   Past Medical History:  Diagnosis Date   Anemia    Bipolar 1 disorder (HCC)    Cancer (HCC)    squamous cell rectal cancer   Diabetes (HCC)    PREDIABETIC   ETOH abuse    H/O self-harm    H/O suicide attempt    x 5 - last 05/2016 - overdose Ibuprofen    Marijuana abuse    Rheumatoid arthritis (HCC)    Family History  Problem Relation Age of Onset   Lupus Mother    Diabetes Father    Hypertension Other    Colon cancer Neg Hx    Colon polyps Neg Hx    Stomach cancer Neg Hx    Past Surgical History:  Procedure Laterality Date   HEMORRHOID SURGERY N/A 02/08/2022   Procedure: EXTERNAL AND INTERNAL HEMORRHOIDECTOMY;  Surgeon: Sebastian Moles, MD;  Location: [facility] OR;  Service: General;  Laterality: N/A;   INCISION AND DRAINAGE ABSCESS Right 04/22/2022   Procedure: INCISION AND DRAINAGE axilla;  Surgeon: Stevie, Herlene Righter, MD;  Location: [facility] ORS;  Service: General;  Laterality: Right;   RECTAL BIOPSY N/A 05/10/2023   Procedure: EXCISION OF LEFT PERIANAL NODULE;  Surgeon: Teresa Lonni HERO, MD;  Location: [facility] OR;  Service: General;  Laterality: N/A;   RECTAL EXAM UNDER ANESTHESIA N/A 07/21/2022   Procedure: RECTAL EXAM UNDER ANESTHESIA;  Surgeon: Teresa Lonni HERO, MD;  Location: [facility] OR;  Service: General;  Laterality: N/A;   RECTAL EXAM UNDER ANESTHESIA N/A 05/10/2023   Procedure: RECTAL EXAM UNDER ANESTHESIA;  Surgeon: Teresa Lonni HERO, MD;  Location: [facility] OR;  Service: General;  Laterality: N/A;  60   TRANSANAL HEMORRHOIDAL DEARTERIALIZATION N/A 07/21/2022   Procedure: ANAL CANAL BIOPSY;  Surgeon: Teresa Lonni HERO, MD;  Location: [facility] OR;  Service: General;  Laterality: N/A;   Social History   Occupational History   Occupation: call center   Tobacco Use   Smoking status: Former    Current packs/day: 0.00    Average packs/day: 0.3 packs/day for 7.0 years (1.8 ttl pk-yrs)    Types: Cigarettes    Start date:  02/17/2012    Quit date: 02/17/2019    Years since quitting: 5.3    Passive exposure: Past   Smokeless tobacco: Never  Vaping Use   Vaping status: Former   Substances: Nicotine   Substance and Sexual Activity   Alcohol use: Not Currently    Comment: social   Drug use: Yes    Frequency: 7.0 times per week    Types: Marijuana   Sexual activity: Yes    Birth control/protection: None

## 2024-06-26 ENCOUNTER — Other Ambulatory Visit: Payer: Self-pay

## 2024-06-26 ENCOUNTER — Other Ambulatory Visit (HOSPITAL_COMMUNITY): Payer: Self-pay

## 2024-06-26 MED ORDER — OLMESARTAN MEDOXOMIL 20 MG PO TABS
20.0000 mg | ORAL_TABLET | Freq: Every day | ORAL | 2 refills | Status: AC
  Filled 2024-06-26: qty 30, 30d supply, fill #0
  Filled 2024-08-02: qty 30, 30d supply, fill #1
  Filled 2024-08-29: qty 30, 30d supply, fill #2

## 2024-06-26 MED ORDER — QUETIAPINE FUMARATE 100 MG PO TABS
100.0000 mg | ORAL_TABLET | Freq: Every day | ORAL | 2 refills | Status: AC
  Filled 2024-06-26: qty 30, 30d supply, fill #0
  Filled 2024-08-02: qty 30, 30d supply, fill #1
  Filled 2024-08-29: qty 30, 30d supply, fill #2

## 2024-07-01 ENCOUNTER — Encounter (HOSPITAL_COMMUNITY): Payer: Self-pay | Admitting: Pharmacist

## 2024-07-01 ENCOUNTER — Other Ambulatory Visit: Payer: Self-pay | Admitting: Internal Medicine

## 2024-07-01 ENCOUNTER — Other Ambulatory Visit (HOSPITAL_COMMUNITY): Payer: Self-pay

## 2024-07-01 DIAGNOSIS — M069 Rheumatoid arthritis, unspecified: Secondary | ICD-10-CM

## 2024-07-03 ENCOUNTER — Other Ambulatory Visit: Payer: Self-pay | Admitting: Pharmacy Technician

## 2024-07-03 ENCOUNTER — Other Ambulatory Visit: Payer: Self-pay

## 2024-07-03 ENCOUNTER — Other Ambulatory Visit (HOSPITAL_COMMUNITY): Payer: Self-pay

## 2024-07-03 MED ORDER — LEFLUNOMIDE 20 MG PO TABS
20.0000 mg | ORAL_TABLET | Freq: Every day | ORAL | 0 refills | Status: AC
  Filled 2024-07-03: qty 90, 90d supply, fill #0

## 2024-07-03 NOTE — Telephone Encounter (Signed)
 Last Fill: 04/18/2024  Labs: 06/02/2024 ALT 67 Rest of CBC and CMP WNL  Next Visit: Due around 08/08/2024. Message sent to the front to schedule.  Last Visit: 05/08/2024  DX:  Rheumatoid arthritis involving multiple sites, unspecified whether rheumatoid factor present   Current Dose per office note 05/08/2024: leflunomide  20 mg PO daily.   Okay to refill Arava  ?

## 2024-07-03 NOTE — Telephone Encounter (Signed)
 Please schedule patient a follow up visit. Patient due around 08/08/2024. Thanks!

## 2024-07-03 NOTE — Telephone Encounter (Signed)
 Attempted to contact patient and left message to advise patient to call the office and schedule appointment.

## 2024-07-03 NOTE — Progress Notes (Signed)
 Specialty Pharmacy Refill Coordination Note  Tammy Garrison is a 33 y.o. female contacted today regarding refills of specialty medication(s) Rinvoq   Patient requested (Patient-Rptd) Delivery   Delivery date: 07/04/2024 Verified address: (Patient-Rptd) [address]   Medication will be filled on: 07/03/2024

## 2024-07-04 ENCOUNTER — Other Ambulatory Visit: Payer: Self-pay

## 2024-07-04 ENCOUNTER — Other Ambulatory Visit (HOSPITAL_COMMUNITY): Payer: Self-pay

## 2024-07-16 ENCOUNTER — Other Ambulatory Visit (HOSPITAL_COMMUNITY): Payer: Self-pay

## 2024-07-26 ENCOUNTER — Other Ambulatory Visit: Payer: Self-pay

## 2024-07-28 ENCOUNTER — Other Ambulatory Visit: Payer: Self-pay

## 2024-07-28 NOTE — Progress Notes (Signed)
 Specialty Pharmacy Refill Coordination Note  Tammy Garrison is a 33 y.o. female contacted today regarding refills of specialty medication(s) Upadacitinib  (Rinvoq )   Patient requested Delivery   Delivery date: 07/31/24   Verified address: [address]   Medication will be filled on: 07/28/24

## 2024-08-02 ENCOUNTER — Other Ambulatory Visit: Payer: Self-pay

## 2024-08-02 ENCOUNTER — Other Ambulatory Visit (HOSPITAL_COMMUNITY): Payer: Self-pay

## 2024-08-02 ENCOUNTER — Other Ambulatory Visit (HOSPITAL_BASED_OUTPATIENT_CLINIC_OR_DEPARTMENT_OTHER): Payer: Self-pay

## 2024-08-02 ENCOUNTER — Other Ambulatory Visit: Payer: Self-pay | Admitting: Internal Medicine

## 2024-08-02 ENCOUNTER — Other Ambulatory Visit: Payer: Self-pay | Admitting: Physician Assistant

## 2024-08-02 DIAGNOSIS — G894 Chronic pain syndrome: Secondary | ICD-10-CM

## 2024-08-02 MED ORDER — DULOXETINE HCL 60 MG PO CPEP
60.0000 mg | ORAL_CAPSULE | Freq: Every day | ORAL | 0 refills | Status: AC
  Filled 2024-08-02: qty 30, 30d supply, fill #0

## 2024-08-02 MED ORDER — DULOXETINE HCL 60 MG PO CPEP
60.0000 mg | ORAL_CAPSULE | Freq: Every day | ORAL | 0 refills | Status: DC
  Filled 2024-08-02: qty 90, 90d supply, fill #0

## 2024-08-02 MED ORDER — ROSUVASTATIN CALCIUM 10 MG PO TABS
10.0000 mg | ORAL_TABLET | Freq: Every day | ORAL | 0 refills | Status: AC
  Filled 2024-08-02: qty 90, 90d supply, fill #0

## 2024-08-02 MED ORDER — QUETIAPINE FUMARATE 200 MG PO TABS
200.0000 mg | ORAL_TABLET | Freq: Every day | ORAL | 2 refills | Status: AC
  Filled 2024-08-02: qty 30, 30d supply, fill #0

## 2024-08-02 NOTE — Telephone Encounter (Signed)
 Last Fill: 04/18/2024  Next Visit: Due January 2026. Message sent to the front to schedule.   Last Visit: 05/08/2024  Dx: Chronic pain syndrome   Current Dose per office note on 05/08/2024: Cymbalta  60 mg daily   Okay to refill Cymbalta ?

## 2024-08-02 NOTE — Telephone Encounter (Signed)
 Please schedule patient a follow up visit. Patient due January 2026. Thanks!   Follow-Up Instructions: Return in about 3 months (around 08/08/2024) for RA on UPA/LEF/GC f/u 3mos.

## 2024-08-04 ENCOUNTER — Other Ambulatory Visit: Payer: Self-pay

## 2024-08-04 ENCOUNTER — Other Ambulatory Visit (HOSPITAL_COMMUNITY): Payer: Self-pay

## 2024-08-04 MED ORDER — PANTOPRAZOLE SODIUM 40 MG PO TBEC
40.0000 mg | DELAYED_RELEASE_TABLET | Freq: Two times a day (BID) | ORAL | 1 refills | Status: AC
  Filled 2024-08-04: qty 60, 30d supply, fill #0

## 2024-08-08 ENCOUNTER — Encounter: Payer: Self-pay | Admitting: Internal Medicine

## 2024-08-16 ENCOUNTER — Other Ambulatory Visit (HOSPITAL_COMMUNITY): Payer: Self-pay

## 2024-08-16 ENCOUNTER — Encounter: Payer: Self-pay | Admitting: Pharmacist

## 2024-08-16 ENCOUNTER — Other Ambulatory Visit: Payer: Self-pay

## 2024-08-18 ENCOUNTER — Other Ambulatory Visit (HOSPITAL_COMMUNITY): Payer: Self-pay

## 2024-08-18 ENCOUNTER — Other Ambulatory Visit: Payer: Self-pay

## 2024-08-18 ENCOUNTER — Encounter: Payer: Self-pay | Admitting: Hematology

## 2024-08-18 MED ORDER — VITAMIN D3 1.25 MG (50000 UT) PO CAPS
50000.0000 [IU] | ORAL_CAPSULE | ORAL | 5 refills | Status: AC
  Filled 2024-08-18 – 2024-08-29 (×2): qty 4, 28d supply, fill #0

## 2024-08-21 ENCOUNTER — Other Ambulatory Visit: Payer: Self-pay

## 2024-08-21 ENCOUNTER — Encounter: Payer: Self-pay | Admitting: Pharmacist

## 2024-08-22 ENCOUNTER — Other Ambulatory Visit: Payer: Self-pay

## 2024-08-22 NOTE — Progress Notes (Signed)
 Specialty Pharmacy Ongoing Clinical Assessment Note  Tammy Garrison is a 34 y.o. female who is being followed by the specialty pharmacy service for RxSp Rheumatoid Arthritis   Patient's specialty medication(s) reviewed today: Upadacitinib  (Rinvoq )   Missed doses in the last 4 weeks: 0   Patient/Caregiver did not have any additional questions or concerns.   Therapeutic benefit summary: Patient is NOT achieving benefit (patient reports she is not feeling much benefit at this time, she has reached out to the provider's office)   Adverse events/side effects summary: No adverse events/side effects   Patient's therapy is appropriate to: Continue    Goals Addressed             This Visit's Progress    Maintain optimal adherence to therapy   On track    Patient is initiating therapy. Patient will maintain adherence and adhere to provider and/or lab appointments         Follow up: 12 months  Silvano LOISE Dolly Specialty Pharmacist

## 2024-08-22 NOTE — Progress Notes (Signed)
 Specialty Pharmacy Refill Coordination Note  Tammy Garrison is a 34 y.o. female contacted today regarding refills of specialty medication(s) Upadacitinib  (Rinvoq )   Patient requested Delivery   Delivery date: 08/25/24   Verified address: [address]   Medication will be filled on: 08/24/24

## 2024-08-24 ENCOUNTER — Other Ambulatory Visit: Payer: Self-pay

## 2024-08-29 ENCOUNTER — Other Ambulatory Visit: Payer: Self-pay | Admitting: Internal Medicine

## 2024-08-29 ENCOUNTER — Other Ambulatory Visit: Payer: Self-pay

## 2024-08-29 ENCOUNTER — Other Ambulatory Visit (HOSPITAL_COMMUNITY): Payer: Self-pay

## 2024-08-29 DIAGNOSIS — M069 Rheumatoid arthritis, unspecified: Secondary | ICD-10-CM

## 2024-08-29 DIAGNOSIS — G894 Chronic pain syndrome: Secondary | ICD-10-CM

## 2024-08-29 MED ORDER — LISINOPRIL 10 MG PO TABS
10.0000 mg | ORAL_TABLET | Freq: Every day | ORAL | 1 refills | Status: AC
  Filled 2024-08-29: qty 30, 30d supply, fill #0

## 2024-08-29 MED ORDER — SEMAGLUTIDE (2 MG/DOSE) 8 MG/3ML ~~LOC~~ SOPN
2.0000 mg | PEN_INJECTOR | SUBCUTANEOUS | 2 refills | Status: AC
  Filled 2024-08-29: qty 3, 28d supply, fill #0

## 2024-08-29 MED ORDER — TIZANIDINE HCL 4 MG PO TABS
4.0000 mg | ORAL_TABLET | Freq: Two times a day (BID) | ORAL | 2 refills | Status: AC | PRN
  Filled 2024-08-29: qty 60, 30d supply, fill #0

## 2024-08-29 MED ORDER — OXYBUTYNIN CHLORIDE ER 10 MG PO TB24
10.0000 mg | ORAL_TABLET | Freq: Every day | ORAL | 5 refills | Status: AC
  Filled 2024-08-29: qty 30, 30d supply, fill #0

## 2024-08-29 MED ORDER — PREDNISONE 5 MG PO TABS
5.0000 mg | ORAL_TABLET | Freq: Every day | ORAL | 0 refills | Status: AC
  Filled 2024-08-29: qty 90, 90d supply, fill #0

## 2024-08-29 NOTE — Telephone Encounter (Signed)
 Attempted to contact patient and left message to advise patient to call the office and schedule appointment.

## 2024-08-29 NOTE — Telephone Encounter (Signed)
 Please schedule patient a follow up visit. Patient due 08/08/2024. Thanks!

## 2024-08-29 NOTE — Telephone Encounter (Signed)
 Last Fill: Prednisone  05/08/2024, Tizanidine  11/04/2023  Next Visit: Due 08/08/2024. Message sent to the front to schedule.   Last Visit: 05/08/2024  Dx: Rheumatoid arthritis involving multiple sites, unspecified whether rheumatoid factor present (HCC)   Current Dose per office note on 05/08/2024: prednisone  5 mg daily, tizanidine  not mentioned  Okay to refill Tizanidine  and Prednisone ?

## 2024-08-30 ENCOUNTER — Encounter: Payer: Self-pay | Admitting: Hematology

## 2024-08-30 ENCOUNTER — Other Ambulatory Visit (HOSPITAL_COMMUNITY): Payer: Self-pay

## 2024-08-30 ENCOUNTER — Other Ambulatory Visit: Payer: Self-pay

## 2024-09-04 ENCOUNTER — Ambulatory Visit: Admitting: Internal Medicine

## 2024-09-13 ENCOUNTER — Ambulatory Visit

## 2024-09-27 ENCOUNTER — Ambulatory Visit: Admitting: Hematology

## 2024-09-27 ENCOUNTER — Other Ambulatory Visit
